# Patient Record
Sex: Female | Born: 1945
Health system: Southern US, Community
[De-identification: ages and names within clinical notes are randomized; demographics above are authoritative.]

## PROBLEM LIST (undated history)

## (undated) DIAGNOSIS — M549 Dorsalgia, unspecified: Secondary | ICD-10-CM

## (undated) DIAGNOSIS — M1990 Unspecified osteoarthritis, unspecified site: Secondary | ICD-10-CM

## (undated) DIAGNOSIS — J449 Chronic obstructive pulmonary disease, unspecified: Secondary | ICD-10-CM

## (undated) DIAGNOSIS — I10 Essential (primary) hypertension: Secondary | ICD-10-CM

## (undated) DIAGNOSIS — G4730 Sleep apnea, unspecified: Secondary | ICD-10-CM

## (undated) DIAGNOSIS — J45909 Unspecified asthma, uncomplicated: Secondary | ICD-10-CM

## (undated) DIAGNOSIS — E785 Hyperlipidemia, unspecified: Secondary | ICD-10-CM

## (undated) DIAGNOSIS — K219 Gastro-esophageal reflux disease without esophagitis: Secondary | ICD-10-CM

## (undated) DIAGNOSIS — R569 Unspecified convulsions: Secondary | ICD-10-CM

## (undated) DIAGNOSIS — E119 Type 2 diabetes mellitus without complications: Secondary | ICD-10-CM

## (undated) DIAGNOSIS — I639 Cerebral infarction, unspecified: Secondary | ICD-10-CM

## (undated) HISTORY — DX: Chronic obstructive pulmonary disease, unspecified: J44.9

## (undated) HISTORY — PX: EYE SURGERY: SHX253

## (undated) HISTORY — PX: ANTERIOR (CYSTOCELE) AND POSTERIOR REPAIR (RECTOCELE) WITH XENFORM GRAFT AND SACROSPINOUS FIXATION: SHX6492

## (undated) HISTORY — PX: APPENDECTOMY: SHX54

## (undated) HISTORY — PX: BACK SURGERY: SHX140

## (undated) HISTORY — PX: OTHER SURGICAL HISTORY: SHX169

## (undated) HISTORY — PX: ROTATOR CUFF REPAIR: SHX139

## (undated) HISTORY — PX: ABDOMINAL HYSTERECTOMY: SHX81

---

## 2005-01-07 ENCOUNTER — Other Ambulatory Visit: Payer: Self-pay

## 2005-01-08 ENCOUNTER — Other Ambulatory Visit: Payer: Self-pay

## 2005-01-08 ENCOUNTER — Observation Stay: Payer: Self-pay | Admitting: Internal Medicine

## 2005-01-09 ENCOUNTER — Other Ambulatory Visit: Payer: Self-pay

## 2005-04-13 ENCOUNTER — Ambulatory Visit: Payer: Self-pay | Admitting: Family Medicine

## 2005-07-02 ENCOUNTER — Emergency Department: Payer: Self-pay | Admitting: General Practice

## 2006-08-29 ENCOUNTER — Ambulatory Visit: Payer: Self-pay | Admitting: Family Medicine

## 2007-12-15 ENCOUNTER — Observation Stay: Payer: Self-pay | Admitting: Internal Medicine

## 2007-12-15 ENCOUNTER — Other Ambulatory Visit: Payer: Self-pay

## 2007-12-25 ENCOUNTER — Ambulatory Visit: Payer: Self-pay | Admitting: Gastroenterology

## 2007-12-26 ENCOUNTER — Ambulatory Visit: Payer: Self-pay | Admitting: Gastroenterology

## 2008-02-09 ENCOUNTER — Ambulatory Visit: Payer: Self-pay | Admitting: Gastroenterology

## 2008-02-23 ENCOUNTER — Ambulatory Visit: Payer: Self-pay | Admitting: Unknown Physician Specialty

## 2008-05-14 ENCOUNTER — Ambulatory Visit: Payer: Self-pay | Admitting: Obstetrics and Gynecology

## 2008-06-18 ENCOUNTER — Emergency Department: Payer: Self-pay | Admitting: Emergency Medicine

## 2008-06-23 ENCOUNTER — Emergency Department: Payer: Self-pay | Admitting: Emergency Medicine

## 2008-07-08 ENCOUNTER — Ambulatory Visit: Payer: Self-pay

## 2008-07-17 ENCOUNTER — Ambulatory Visit: Payer: Self-pay | Admitting: Unknown Physician Specialty

## 2008-11-08 ENCOUNTER — Ambulatory Visit: Payer: Self-pay | Admitting: Obstetrics and Gynecology

## 2009-01-15 ENCOUNTER — Ambulatory Visit: Payer: Self-pay | Admitting: Family Medicine

## 2009-06-12 ENCOUNTER — Ambulatory Visit: Payer: Self-pay | Admitting: Specialist

## 2009-07-09 ENCOUNTER — Ambulatory Visit: Payer: Self-pay | Admitting: Family Medicine

## 2009-09-04 ENCOUNTER — Inpatient Hospital Stay: Payer: Self-pay | Admitting: Internal Medicine

## 2010-04-21 ENCOUNTER — Ambulatory Visit: Payer: Self-pay | Admitting: Family Medicine

## 2010-07-17 ENCOUNTER — Ambulatory Visit: Payer: Self-pay | Admitting: Allergy

## 2010-11-17 ENCOUNTER — Ambulatory Visit: Payer: Self-pay | Admitting: Obstetrics and Gynecology

## 2010-11-27 ENCOUNTER — Emergency Department: Payer: Self-pay | Admitting: Emergency Medicine

## 2012-02-29 ENCOUNTER — Emergency Department: Payer: Self-pay | Admitting: Emergency Medicine

## 2012-02-29 LAB — COMPREHENSIVE METABOLIC PANEL
Albumin: 4.1 g/dL (ref 3.4–5.0)
Alkaline Phosphatase: 94 U/L (ref 50–136)
Anion Gap: 14 (ref 7–16)
BUN: 33 mg/dL — ABNORMAL HIGH (ref 7–18)
Bilirubin,Total: 0.3 mg/dL (ref 0.2–1.0)
Calcium, Total: 9.2 mg/dL (ref 8.5–10.1)
Chloride: 107 mmol/L (ref 98–107)
Co2: 21 mmol/L (ref 21–32)
Creatinine: 1.16 mg/dL (ref 0.60–1.30)
EGFR (African American): >60
EGFR (Non-African Amer.): 50 — ABNORMAL LOW
Glucose: 131 mg/dL — ABNORMAL HIGH (ref 65–99)
Osmolality: 292 (ref 275–301)
Potassium: 5 mmol/L (ref 3.5–5.1)
SGOT(AST): 27 U/L (ref 15–37)
SGPT (ALT): 30 U/L
Sodium: 142 mmol/L (ref 136–145)
Total Protein: 7.7 g/dL (ref 6.4–8.2)

## 2012-02-29 LAB — CBC
HGB: 12.5 g/dL (ref 12.0–16.0)
MCH: 29.9 pg (ref 26.0–34.0)
MCV: 87 fL (ref 80–100)
RBC: 4.19 10*6/uL (ref 3.80–5.20)
RDW: 13.4 % (ref 11.5–14.5)

## 2012-02-29 LAB — URINALYSIS, COMPLETE
Bilirubin,UR: NEGATIVE
Blood: NEGATIVE
Glucose,UR: NEGATIVE mg/dL (ref 0–75)
Ketone: NEGATIVE
Leukocyte Esterase: NEGATIVE
Nitrite: NEGATIVE
Ph: 5 (ref 4.5–8.0)
Protein: NEGATIVE
RBC,UR: NONE SEEN /HPF (ref 0–5)
Specific Gravity: 1.005 (ref 1.003–1.030)
Squamous Epithelial: 1
WBC UR: <1 /HPF (ref 0–5)

## 2012-02-29 LAB — LIPASE, BLOOD: Lipase: 282 U/L (ref 73–393)

## 2012-03-11 ENCOUNTER — Other Ambulatory Visit: Payer: Self-pay | Admitting: Gastroenterology

## 2012-03-11 LAB — CLOSTRIDIUM DIFFICILE BY PCR

## 2012-03-14 LAB — STOOL CULTURE

## 2012-05-02 ENCOUNTER — Ambulatory Visit: Payer: Self-pay | Admitting: Gastroenterology

## 2013-03-07 ENCOUNTER — Ambulatory Visit: Payer: Self-pay | Admitting: Family Medicine

## 2013-03-09 ENCOUNTER — Ambulatory Visit: Payer: Self-pay | Admitting: Family Medicine

## 2013-12-03 ENCOUNTER — Ambulatory Visit: Payer: Self-pay | Admitting: Family Medicine

## 2014-04-16 ENCOUNTER — Ambulatory Visit: Payer: Self-pay | Admitting: Family Medicine

## 2014-08-08 ENCOUNTER — Ambulatory Visit: Payer: Self-pay | Admitting: Family Medicine

## 2014-08-08 LAB — POTASSIUM: POTASSIUM: 4.7 mmol/L (ref 3.5–5.1)

## 2014-10-15 DIAGNOSIS — E781 Pure hyperglyceridemia: Secondary | ICD-10-CM | POA: Insufficient documentation

## 2014-10-15 DIAGNOSIS — E785 Hyperlipidemia, unspecified: Secondary | ICD-10-CM | POA: Insufficient documentation

## 2014-10-16 ENCOUNTER — Ambulatory Visit: Payer: Self-pay | Admitting: Unknown Physician Specialty

## 2014-10-29 ENCOUNTER — Ambulatory Visit: Payer: Self-pay | Admitting: Gastroenterology

## 2014-11-04 ENCOUNTER — Ambulatory Visit: Payer: Self-pay | Admitting: Gastroenterology

## 2014-12-25 ENCOUNTER — Ambulatory Visit: Payer: Self-pay | Admitting: Physician Assistant

## 2014-12-25 DIAGNOSIS — T148 Other injury of unspecified body region: Secondary | ICD-10-CM | POA: Diagnosis not present

## 2014-12-25 DIAGNOSIS — M79671 Pain in right foot: Secondary | ICD-10-CM | POA: Diagnosis not present

## 2014-12-25 DIAGNOSIS — S99921A Unspecified injury of right foot, initial encounter: Secondary | ICD-10-CM | POA: Diagnosis not present

## 2015-01-02 DIAGNOSIS — B373 Candidiasis of vulva and vagina: Secondary | ICD-10-CM | POA: Diagnosis not present

## 2015-01-02 DIAGNOSIS — N3000 Acute cystitis without hematuria: Secondary | ICD-10-CM | POA: Diagnosis not present

## 2015-02-18 DIAGNOSIS — J40 Bronchitis, not specified as acute or chronic: Secondary | ICD-10-CM | POA: Diagnosis not present

## 2015-02-18 DIAGNOSIS — Z634 Disappearance and death of family member: Secondary | ICD-10-CM | POA: Diagnosis not present

## 2015-02-18 DIAGNOSIS — F4320 Adjustment disorder, unspecified: Secondary | ICD-10-CM | POA: Diagnosis not present

## 2015-02-18 DIAGNOSIS — J449 Chronic obstructive pulmonary disease, unspecified: Secondary | ICD-10-CM | POA: Diagnosis not present

## 2015-02-18 DIAGNOSIS — R350 Frequency of micturition: Secondary | ICD-10-CM | POA: Diagnosis not present

## 2015-02-24 DIAGNOSIS — R350 Frequency of micturition: Secondary | ICD-10-CM | POA: Diagnosis not present

## 2015-03-07 DIAGNOSIS — J439 Emphysema, unspecified: Secondary | ICD-10-CM | POA: Diagnosis not present

## 2015-03-10 DIAGNOSIS — J4520 Mild intermittent asthma, uncomplicated: Secondary | ICD-10-CM | POA: Diagnosis not present

## 2015-03-11 DIAGNOSIS — I739 Peripheral vascular disease, unspecified: Secondary | ICD-10-CM | POA: Diagnosis not present

## 2015-03-11 DIAGNOSIS — E785 Hyperlipidemia, unspecified: Secondary | ICD-10-CM | POA: Diagnosis not present

## 2015-03-11 DIAGNOSIS — I7100 Dissection of unspecified site of aorta: Secondary | ICD-10-CM | POA: Diagnosis not present

## 2015-03-11 DIAGNOSIS — I714 Abdominal aortic aneurysm, without rupture: Secondary | ICD-10-CM | POA: Diagnosis not present

## 2015-03-28 ENCOUNTER — Ambulatory Visit
Admit: 2015-03-28 | Disposition: A | Discharge status: Home / Self Care | Payer: Self-pay | Attending: Family Medicine | Admitting: Family Medicine

## 2015-03-28 DIAGNOSIS — I2510 Atherosclerotic heart disease of native coronary artery without angina pectoris: Secondary | ICD-10-CM | POA: Diagnosis not present

## 2015-03-28 DIAGNOSIS — M5136 Other intervertebral disc degeneration, lumbar region: Secondary | ICD-10-CM | POA: Diagnosis not present

## 2015-03-28 DIAGNOSIS — M898X8 Other specified disorders of bone, other site: Secondary | ICD-10-CM | POA: Diagnosis not present

## 2015-03-28 DIAGNOSIS — M47816 Spondylosis without myelopathy or radiculopathy, lumbar region: Secondary | ICD-10-CM | POA: Diagnosis not present

## 2015-03-28 DIAGNOSIS — B373 Candidiasis of vulva and vagina: Secondary | ICD-10-CM | POA: Diagnosis not present

## 2015-03-28 DIAGNOSIS — M47896 Other spondylosis, lumbar region: Secondary | ICD-10-CM | POA: Diagnosis not present

## 2015-03-31 LAB — SURGICAL PATHOLOGY

## 2015-04-10 DIAGNOSIS — E119 Type 2 diabetes mellitus without complications: Secondary | ICD-10-CM | POA: Diagnosis not present

## 2015-04-15 DIAGNOSIS — E119 Type 2 diabetes mellitus without complications: Secondary | ICD-10-CM | POA: Diagnosis not present

## 2015-04-16 DIAGNOSIS — M47816 Spondylosis without myelopathy or radiculopathy, lumbar region: Secondary | ICD-10-CM | POA: Diagnosis not present

## 2015-04-16 DIAGNOSIS — M48062 Spinal stenosis, lumbar region with neurogenic claudication: Secondary | ICD-10-CM | POA: Diagnosis present

## 2015-04-16 DIAGNOSIS — M4316 Spondylolisthesis, lumbar region: Secondary | ICD-10-CM | POA: Diagnosis not present

## 2015-04-16 DIAGNOSIS — M9943 Connective tissue stenosis of neural canal of lumbar region: Secondary | ICD-10-CM | POA: Diagnosis not present

## 2015-04-17 DIAGNOSIS — E785 Hyperlipidemia, unspecified: Secondary | ICD-10-CM | POA: Diagnosis not present

## 2015-04-17 DIAGNOSIS — E781 Pure hyperglyceridemia: Secondary | ICD-10-CM | POA: Diagnosis not present

## 2015-04-17 DIAGNOSIS — I10 Essential (primary) hypertension: Secondary | ICD-10-CM | POA: Diagnosis not present

## 2015-04-17 DIAGNOSIS — E1129 Type 2 diabetes mellitus with other diabetic kidney complication: Secondary | ICD-10-CM | POA: Diagnosis not present

## 2015-04-18 ENCOUNTER — Other Ambulatory Visit: Payer: Self-pay | Admitting: Surgery

## 2015-04-18 DIAGNOSIS — M9943 Connective tissue stenosis of neural canal of lumbar region: Secondary | ICD-10-CM

## 2015-04-21 ENCOUNTER — Encounter: Payer: Self-pay | Admitting: Emergency Medicine

## 2015-04-21 ENCOUNTER — Other Ambulatory Visit: Payer: Self-pay

## 2015-04-21 ENCOUNTER — Emergency Department
Admission: EM | Admit: 2015-04-21 | Discharge: 2015-04-21 | Disposition: A | Discharge status: Home / Self Care | Payer: Medicare Other | Attending: Emergency Medicine | Admitting: Emergency Medicine

## 2015-04-21 DIAGNOSIS — I10 Essential (primary) hypertension: Secondary | ICD-10-CM | POA: Insufficient documentation

## 2015-04-21 DIAGNOSIS — F419 Anxiety disorder, unspecified: Secondary | ICD-10-CM | POA: Insufficient documentation

## 2015-04-21 DIAGNOSIS — Z880 Allergy status to penicillin: Secondary | ICD-10-CM | POA: Insufficient documentation

## 2015-04-21 DIAGNOSIS — E119 Type 2 diabetes mellitus without complications: Secondary | ICD-10-CM | POA: Diagnosis not present

## 2015-04-21 HISTORY — DX: Hyperlipidemia, unspecified: E78.5

## 2015-04-21 HISTORY — DX: Essential (primary) hypertension: I10

## 2015-04-21 HISTORY — DX: Type 2 diabetes mellitus without complications: E11.9

## 2015-04-21 HISTORY — DX: Dorsalgia, unspecified: M54.9

## 2015-04-21 LAB — CBC
HEMATOCRIT: 40.4 % (ref 35.0–47.0)
HEMOGLOBIN: 13.3 g/dL (ref 12.0–16.0)
MCH: 28 pg (ref 26.0–34.0)
MCHC: 32.9 g/dL (ref 32.0–36.0)
MCV: 85 fL (ref 80.0–100.0)
Platelets: 291 10*3/uL (ref 150–440)
RBC: 4.75 MIL/uL (ref 3.80–5.20)
RDW: 14.6 % — ABNORMAL HIGH (ref 11.5–14.5)
WBC: 8.9 10*3/uL (ref 3.6–11.0)

## 2015-04-21 LAB — COMPREHENSIVE METABOLIC PANEL
ALT: 25 U/L (ref 14–54)
ANION GAP: 11 (ref 5–15)
AST: 30 U/L (ref 15–41)
Albumin: 3.9 g/dL (ref 3.5–5.0)
Alkaline Phosphatase: 57 U/L (ref 38–126)
BUN: 30 mg/dL — ABNORMAL HIGH (ref 6–20)
CALCIUM: 10.2 mg/dL (ref 8.9–10.3)
CO2: 24 mmol/L (ref 22–32)
Chloride: 102 mmol/L (ref 101–111)
Creatinine, Ser: 1.17 mg/dL — ABNORMAL HIGH (ref 0.44–1.00)
GFR calc Af Amer: 55 mL/min — ABNORMAL LOW (ref >60)
GFR calc non Af Amer: 47 mL/min — ABNORMAL LOW (ref >60)
Glucose, Bld: 119 mg/dL — ABNORMAL HIGH (ref 65–99)
Potassium: 4.3 mmol/L (ref 3.5–5.1)
SODIUM: 137 mmol/L (ref 135–145)
Total Bilirubin: 0.7 mg/dL (ref 0.3–1.2)
Total Protein: 7.1 g/dL (ref 6.5–8.1)

## 2015-04-21 LAB — TROPONIN I: Troponin I: <0.03 ng/mL (ref <0.031)

## 2015-04-21 MED ORDER — LABETALOL HCL 5 MG/ML IV SOLN
10.0000 mg | Freq: Once | INTRAVENOUS | Status: AC
  Administered 2015-04-21: 10 mg via INTRAVENOUS

## 2015-04-21 MED ORDER — LABETALOL HCL 5 MG/ML IV SOLN
INTRAVENOUS | Status: AC
  Administered 2015-04-21: 10 mg via INTRAVENOUS
  Filled 2015-04-21: qty 4

## 2015-04-21 NOTE — ED Provider Notes (Signed)
[facility] Emergency Department Provider Note  ____________________________________________  Time seen: 9 AM  I have reviewed the triage vital signs and the nursing notes.   HISTORY  Chief Complaint Hypertension      HPI Judy Bolton is a 69 y.o. female who presents with complaints of high blood pressure. She reports her endocrinologist adjusted her blood pressure medications 3 days ago and she feels this may have been a mistake. Today she checked her blood pressure and was over 200 systolic. She does complain of some vague dizziness. But no chest pain no shortness of breath. No focal deficits and no headache. She does have a long history of high blood pressure which has been decently controlled until recently. Patient is able to follow up with her PCP in one day     Past Medical History  Diagnosis Date  . Hypertension   . Diabetes mellitus without complication   . Hyperlipidemia   . Back pain     There are no active problems to display for this patient.   Past Surgical History  Procedure Laterality Date  . Abdominal hysterectomy    . Rotator cuff repair Right   . Appendectomy      No current outpatient prescriptions on file.  Allergies Codeine and Penicillins  Family History  Problem Relation Age of Onset  . Heart attack Mother   . Cancer Father     Social History History  Substance Use Topics  . Smoking status: Never Smoker   . Smokeless tobacco: Not on file  . Alcohol Use: Yes    Review of Systems  Constitutional: Negative for fever. Eyes: Negative for visual changes. ENT: Negative for sore throat. Cardiovascular: Possible acid reflux Respiratory: Negative for shortness of breath. Gastrointestinal: Negative for abdominal pain, vomiting and diarrhea. Genitourinary: Negative for dysuria. Musculoskeletal: Negative for back pain. Skin: Negative for rash. Neurological: Negative for headaches, focal weakness or  numbness. Psychiatric: Mild anxiety  10-point ROS otherwise negative.  ____________________________________________   PHYSICAL EXAM:  VITAL SIGNS: ED Triage Vitals  Enc Vitals Group     BP 04/21/15 0853 195/82 mmHg     Pulse Rate 04/21/15 0853 82     Resp 04/21/15 0853 18     Temp 04/21/15 0853 98.2 F (36.8 C)     Temp Source 04/21/15 0853 Oral     SpO2 04/21/15 0853 96 %     Weight 04/21/15 0853 146 lb (66.225 kg)     Height 04/21/15 0853 5\' 2"  (1.575 m)     Head Cir --      Peak Flow --      Pain Score 04/21/15 0853 5     Pain Loc --      Pain Edu? --      Excl. in GC? --      Constitutional: Alert and oriented. Well appearing and in no distress. Eyes: Conjunctivae are normal. PERRL. Normal extraocular movements. ENT   Head: Normocephalic and atraumatic.   Nose: No congestion/rhinnorhea.   Mouth/Throat: Mucous membranes are moist.   Neck: No stridor. Hematological/Lymphatic/Immunilogical: No cervical lymphadenopathy. Cardiovascular: Normal rate, regular rhythm. Normal and symmetric distal pulses are present in all extremities. No murmurs, rubs, or gallops. Respiratory: Normal respiratory effort without tachypnea nor retractions. Breath sounds are clear and equal bilaterally. No wheezes/rales/rhonchi. Gastrointestinal: Soft and nontender. No distention. There is no CVA tenderness. Genitourinary: deferred Musculoskeletal: Nontender with normal range of motion in all extremities. No joint effusions.  No lower extremity  tenderness nor edema. Neurologic:  Normal speech and language. No gross focal neurologic deficits are appreciated. Speech is normal.  Skin:  Skin is warm, dry and intact. No rash noted. Psychiatric: Mood and affect are normal. Speech and behavior are normal. Patient exhibits appropriate insight and judgment.  ____________________________________________    LABS (pertinent positives/negatives)  Mildly elevated creatinine likely due to  dehydration, otherwise normal labs  ____________________________________________   EKG   Date: 04/21/2015  Rate: 75  Rhythm: normal sinus rhythm  QRS Axis: normal  Intervals: normal  ST/T Wave abnormalities: normal  Conduction Disutrbances: none  Narrative Interpretation: unremarkable      ____________________________________________    RADIOLOGY  None ____________________________________________   PROCEDURES  Procedure(s) performed: None Critical Care performed: None    ____________________________________________   INITIAL IMPRESSION / ASSESSMENT AND PLAN / ED COURSE  Pertinent labs & imaging results that were available during my care of the patient were reviewed by me and considered in my medical decision making (see chart for details).  Patient well-appearing, benign exam except for elevated blood pressure. We will check enzymes kidney function and give labetalol 10 mg IV.  ____________________________________________ ----------------------------------------- 11:08 AM on 04/21/2015 -----------------------------------------  Blood pressure is improved significantly after labetalol IV. Patient has follow-up with PCP this week. Return precautions given. Patient feels well  FINAL CLINICAL IMPRESSION(S) / ED DIAGNOSES  Final diagnoses:  Essential hypertension     Jene Every, MD 04/21/15 1108

## 2015-04-21 NOTE — Discharge Instructions (Signed)

## 2015-04-21 NOTE — ED Notes (Signed)
Pt to ed with c/o elevated blood pressure since Friday.  Pt states she was seen by PMD on Friday and meds changed but HTN remains.

## 2015-04-22 DIAGNOSIS — E876 Hypokalemia: Secondary | ICD-10-CM | POA: Diagnosis not present

## 2015-04-22 DIAGNOSIS — I10 Essential (primary) hypertension: Secondary | ICD-10-CM | POA: Diagnosis not present

## 2015-04-22 DIAGNOSIS — Z09 Encounter for follow-up examination after completed treatment for conditions other than malignant neoplasm: Secondary | ICD-10-CM | POA: Diagnosis not present

## 2015-04-25 ENCOUNTER — Ambulatory Visit
Admission: RE | Admit: 2015-04-25 | Discharge: 2015-04-25 | Disposition: A | Discharge status: Home / Self Care | Payer: Medicare Other | Source: Ambulatory Visit | Attending: Surgery | Admitting: Surgery

## 2015-04-25 DIAGNOSIS — M9943 Connective tissue stenosis of neural canal of lumbar region: Secondary | ICD-10-CM

## 2015-04-25 DIAGNOSIS — M545 Low back pain: Secondary | ICD-10-CM | POA: Diagnosis present

## 2015-04-25 DIAGNOSIS — M4806 Spinal stenosis, lumbar region: Secondary | ICD-10-CM | POA: Insufficient documentation

## 2015-05-07 DIAGNOSIS — M5416 Radiculopathy, lumbar region: Secondary | ICD-10-CM | POA: Diagnosis not present

## 2015-05-07 DIAGNOSIS — M4806 Spinal stenosis, lumbar region: Secondary | ICD-10-CM | POA: Diagnosis not present

## 2015-05-12 ENCOUNTER — Other Ambulatory Visit: Payer: Self-pay | Admitting: Family Medicine

## 2015-05-12 DIAGNOSIS — E785 Hyperlipidemia, unspecified: Secondary | ICD-10-CM

## 2015-05-20 ENCOUNTER — Other Ambulatory Visit: Payer: Self-pay | Admitting: Family Medicine

## 2015-05-20 DIAGNOSIS — I10 Essential (primary) hypertension: Secondary | ICD-10-CM

## 2015-05-28 DIAGNOSIS — M4806 Spinal stenosis, lumbar region: Secondary | ICD-10-CM | POA: Diagnosis not present

## 2015-05-28 DIAGNOSIS — M5416 Radiculopathy, lumbar region: Secondary | ICD-10-CM | POA: Diagnosis not present

## 2015-06-19 ENCOUNTER — Other Ambulatory Visit: Payer: Self-pay | Admitting: Family Medicine

## 2015-06-19 DIAGNOSIS — E785 Hyperlipidemia, unspecified: Secondary | ICD-10-CM

## 2015-06-24 DIAGNOSIS — J449 Chronic obstructive pulmonary disease, unspecified: Secondary | ICD-10-CM | POA: Diagnosis not present

## 2015-06-24 DIAGNOSIS — J45909 Unspecified asthma, uncomplicated: Secondary | ICD-10-CM | POA: Diagnosis not present

## 2015-06-25 DIAGNOSIS — M5416 Radiculopathy, lumbar region: Secondary | ICD-10-CM | POA: Diagnosis not present

## 2015-06-25 DIAGNOSIS — M4806 Spinal stenosis, lumbar region: Secondary | ICD-10-CM | POA: Diagnosis not present

## 2015-07-13 ENCOUNTER — Other Ambulatory Visit: Payer: Self-pay | Admitting: Family Medicine

## 2015-07-13 DIAGNOSIS — I10 Essential (primary) hypertension: Secondary | ICD-10-CM

## 2015-07-16 DIAGNOSIS — M47816 Spondylosis without myelopathy or radiculopathy, lumbar region: Secondary | ICD-10-CM | POA: Diagnosis not present

## 2015-07-16 DIAGNOSIS — M9943 Connective tissue stenosis of neural canal of lumbar region: Secondary | ICD-10-CM | POA: Diagnosis not present

## 2015-07-16 DIAGNOSIS — M4316 Spondylolisthesis, lumbar region: Secondary | ICD-10-CM | POA: Diagnosis not present

## 2015-07-21 DIAGNOSIS — M545 Low back pain: Secondary | ICD-10-CM | POA: Diagnosis not present

## 2015-07-21 DIAGNOSIS — M6281 Muscle weakness (generalized): Secondary | ICD-10-CM | POA: Diagnosis not present

## 2015-08-12 ENCOUNTER — Other Ambulatory Visit: Payer: Self-pay | Admitting: Family Medicine

## 2015-08-12 DIAGNOSIS — I10 Essential (primary) hypertension: Secondary | ICD-10-CM

## 2015-08-13 DIAGNOSIS — R062 Wheezing: Secondary | ICD-10-CM | POA: Diagnosis not present

## 2015-08-15 ENCOUNTER — Ambulatory Visit
Admission: EM | Admit: 2015-08-15 | Discharge: 2015-08-15 | Disposition: A | Discharge status: Home / Self Care | Payer: Medicare Other | Attending: Family Medicine | Admitting: Family Medicine

## 2015-08-15 ENCOUNTER — Encounter: Payer: Self-pay | Admitting: Emergency Medicine

## 2015-08-15 DIAGNOSIS — T148XXA Other injury of unspecified body region, initial encounter: Secondary | ICD-10-CM

## 2015-08-15 DIAGNOSIS — S81812A Laceration without foreign body, left lower leg, initial encounter: Secondary | ICD-10-CM

## 2015-08-15 HISTORY — DX: Unspecified osteoarthritis, unspecified site: M19.90

## 2015-08-15 HISTORY — DX: Unspecified asthma, uncomplicated: J45.909

## 2015-08-15 HISTORY — DX: Gastro-esophageal reflux disease without esophagitis: K21.9

## 2015-08-15 MED ORDER — MUPIROCIN 2 % EX OINT: 1.0000 "application " | TOPICAL_OINTMENT | Freq: Two times a day (BID) | CUTANEOUS | Status: DC

## 2015-08-15 MED ORDER — CLINDAMYCIN HCL 300 MG PO CAPS
300.0000 mg | ORAL_CAPSULE | Freq: Three times a day (TID) | ORAL | Status: DC
Start: 2015-08-15 — End: 2015-10-16

## 2015-08-15 NOTE — Discharge Instructions (Signed)
Contusion A contusion is a deep bruise. Contusions are the result of an injury that caused bleeding under the skin. The contusion may turn blue, purple, or yellow. Minor injuries will give you a painless contusion, but more severe contusions may stay painful and swollen for a few weeks.  CAUSES  A contusion is usually caused by a blow, trauma, or direct force to an area of the body. SYMPTOMS   Swelling and redness of the injured area.  Bruising of the injured area.  Tenderness and soreness of the injured area.  Pain. DIAGNOSIS  The diagnosis can be made by taking a history and physical exam. An X-ray, CT scan, or MRI may be needed to determine if there were any associated injuries, such as fractures. TREATMENT  Specific treatment will depend on what area of the body was injured. In general, the best treatment for a contusion is resting, icing, elevating, and applying cold compresses to the injured area. Over-the-counter medicines may also be recommended for pain control. Ask your caregiver what the best treatment is for your contusion. HOME CARE INSTRUCTIONS   Put ice on the injured area.  Put ice in a plastic bag.  Place a towel between your skin and the bag.  Leave the ice on for 15-20 minutes, 3-4 times a day, or as directed by your health care provider.  Only take over-the-counter or prescription medicines for pain, discomfort, or fever as directed by your caregiver. Your caregiver may recommend avoiding anti-inflammatory medicines (aspirin, ibuprofen, and naproxen) for 48 hours because these medicines may increase bruising.  Rest the injured area.  If possible, elevate the injured area to reduce swelling. SEEK IMMEDIATE MEDICAL CARE IF:   You have increased bruising or swelling.  You have pain that is getting worse.  Your swelling or pain is not relieved with medicines. MAKE SURE YOU:   Understand these instructions.  Will watch your condition.  Will get help right  away if you are not doing well or get worse. Document Released: 09/01/2005 Document Revised: 11/27/2013 Document Reviewed: 09/27/2011 [facility] Patient Information 2015 [place], [place]. This information is not intended to replace advice given to you by your health care provider. Make sure you discuss any questions you have with your health care provider. Abrasion An abrasion is a cut or scrape of the skin. Abrasions do not extend through all layers of the skin and most heal within 10 days. It is important to care for your abrasion properly to prevent infection. CAUSES  Most abrasions are caused by falling on, or gliding across, the ground or other surface. When your skin rubs on something, the outer and inner layer of skin rubs off, causing an abrasion. DIAGNOSIS  Your caregiver will be able to diagnose an abrasion during a physical exam.  TREATMENT  Your treatment depends on how large and deep the abrasion is. Generally, your abrasion will be cleaned with water and a mild soap to remove any dirt or debris. An antibiotic ointment may be put over the abrasion to prevent an infection. A bandage (dressing) may be wrapped around the abrasion to keep it from getting dirty.  You may need a tetanus shot if:  You cannot remember when you had your last tetanus shot.  You have never had a tetanus shot.  The injury broke your skin. If you get a tetanus shot, your arm may swell, get red, and feel warm to the touch. This is common and not a problem. If you need a tetanus  shot and you choose not to have one, there is a rare chance of getting tetanus. Sickness from tetanus can be serious.  HOME CARE INSTRUCTIONS   If a dressing was applied, change it at least once a day or as directed by your caregiver. If the bandage sticks, soak it off with warm water.   Wash the area with water and a mild soap to remove all the ointment 2 times a day. Rinse off the soap and pat the area dry with a clean towel.    Reapply any ointment as directed by your caregiver. This will help prevent infection and keep the bandage from sticking. Use gauze over the wound and under the dressing to help keep the bandage from sticking.   Change your dressing right away if it becomes wet or dirty.   Only take over-the-counter or prescription medicines for pain, discomfort, or fever as directed by your caregiver.   Follow up with your caregiver within 24-48 hours for a wound check, or as directed. If you were not given a wound-check appointment, look closely at your abrasion for redness, swelling, or pus. These are signs of infection. SEEK IMMEDIATE MEDICAL CARE IF:   You have increasing pain in the wound.   You have redness, swelling, or tenderness around the wound.   You have pus coming from the wound.   You have a fever or persistent symptoms for more than 2-3 days.  You have a fever and your symptoms suddenly get worse.  You have a bad smell coming from the wound or dressing.  MAKE SURE YOU:   Understand these instructions.  Will watch your condition.  Will get help right away if you are not doing well or get worse. Document Released: 09/01/2005 Document Revised: 11/08/2012 Document Reviewed: 10/26/2011 [facility] Patient Information 2015 [place], [place]. This information is not intended to replace advice given to you by your health care provider. Make sure you discuss any questions you have with your health care provider. Laceration Care, Adult A laceration is a cut or lesion that goes through all layers of the skin and into the tissue just beneath the skin. TREATMENT  Some lacerations may not require closure. Some lacerations may not be able to be closed due to an increased risk of infection. It is important to see your caregiver as soon as possible after an injury to minimize the risk of infection and maximize the opportunity for successful closure. If closure is appropriate, pain medicines may  be given, if needed. The wound will be cleaned to help prevent infection. Your caregiver will use stitches (sutures), staples, wound glue (adhesive), or skin adhesive strips to repair the laceration. These tools bring the skin edges together to allow for faster healing and a better cosmetic outcome. However, all wounds will heal with a scar. Once the wound has healed, scarring can be minimized by covering the wound with sunscreen during the day for 1 full year. HOME CARE INSTRUCTIONS  For sutures or staples:  Keep the wound clean and dry.  If you were given a bandage (dressing), you should change it at least once a day. Also, change the dressing if it becomes wet or dirty, or as directed by your caregiver.  Wash the wound with soap and water 2 times a day. Rinse the wound off with water to remove all soap. Pat the wound dry with a clean towel.  After cleaning, apply a thin layer of the antibiotic ointment as recommended by your caregiver. This  will help prevent infection and keep the dressing from sticking.  You may shower as usual after the first 24 hours. Do not soak the wound in water until the sutures are removed.  Only take over-the-counter or prescription medicines for pain, discomfort, or fever as directed by your caregiver.  Get your sutures or staples removed as directed by your caregiver. For skin adhesive strips:  Keep the wound clean and dry.  Do not get the skin adhesive strips wet. You may bathe carefully, using caution to keep the wound dry.  If the wound gets wet, pat it dry with a clean towel.  Skin adhesive strips will fall off on their own. You may trim the strips as the wound heals. Do not remove skin adhesive strips that are still stuck to the wound. They will fall off in time. For wound adhesive:  You may briefly wet your wound in the shower or bath. Do not soak or scrub the wound. Do not swim. Avoid periods of heavy perspiration until the skin adhesive has fallen  off on its own. After showering or bathing, gently pat the wound dry with a clean towel.  Do not apply liquid medicine, cream medicine, or ointment medicine to your wound while the skin adhesive is in place. This may loosen the film before your wound is healed.  If a dressing is placed over the wound, be careful not to apply tape directly over the skin adhesive. This may cause the adhesive to be pulled off before the wound is healed.  Avoid prolonged exposure to sunlight or tanning lamps while the skin adhesive is in place. Exposure to ultraviolet light in the first year will darken the scar.  The skin adhesive will usually remain in place for 5 to 10 days, then naturally fall off the skin. Do not pick at the adhesive film. You may need a tetanus shot if:  You cannot remember when you had your last tetanus shot.  You have never had a tetanus shot. If you get a tetanus shot, your arm may swell, get red, and feel warm to the touch. This is common and not a problem. If you need a tetanus shot and you choose not to have one, there is a rare chance of getting tetanus. Sickness from tetanus can be serious. SEEK MEDICAL CARE IF:   You have redness, swelling, or increasing pain in the wound.  You see a red line that goes away from the wound.  You have yellowish-white fluid (pus) coming from the wound.  You have a fever.  You notice a bad smell coming from the wound or dressing.  Your wound breaks open before or after sutures have been removed.  You notice something coming out of the wound such as wood or glass.  Your wound is on your hand or foot and you cannot move a finger or toe. SEEK IMMEDIATE MEDICAL CARE IF:   Your pain is not controlled with prescribed medicine.  You have severe swelling around the wound causing pain and numbness or a change in color in your arm, hand, leg, or foot.  Your wound splits open and starts bleeding.  You have worsening numbness, weakness, or loss of  function of any joint around or beyond the wound.  You develop painful lumps near the wound or on the skin anywhere on your body. MAKE SURE YOU:   Understand these instructions.  Will watch your condition.  Will get help right away if you are not doing  well or get worse. Document Released: 11/22/2005 Document Revised: 02/14/2012 Document Reviewed: 05/18/2011 [facility] Patient Information 2015 [place], [place]. This information is not intended to replace advice given to you by your health care provider. Make sure you discuss any questions you have with your health care provider. Cellulitis Cellulitis is an infection of the skin and the tissue beneath it. The infected area is usually red and tender. Cellulitis occurs most often in the arms and lower legs.  CAUSES  Cellulitis is caused by bacteria that enter the skin through cracks or cuts in the skin. The most common types of bacteria that cause cellulitis are staphylococci and streptococci. SIGNS AND SYMPTOMS   Redness and warmth.  Swelling.  Tenderness or pain.  Fever. DIAGNOSIS  Your health care provider can usually determine what is wrong based on a physical exam. Blood tests may also be done. TREATMENT  Treatment usually involves taking an antibiotic medicine. HOME CARE INSTRUCTIONS   Take your antibiotic medicine as directed by your health care provider. Finish the antibiotic even if you start to feel better.  Keep the infected arm or leg elevated to reduce swelling.  Apply a warm cloth to the affected area up to 4 times per day to relieve pain.  Take medicines only as directed by your health care provider.  Keep all follow-up visits as directed by your health care provider. SEEK MEDICAL CARE IF:   You notice red streaks coming from the infected area.  Your red area gets larger or turns dark in color.  Your bone or joint underneath the infected area becomes painful after the skin has healed.  Your infection returns  in the same area or another area.  You notice a swollen bump in the infected area.  You develop new symptoms.  You have a fever. SEEK IMMEDIATE MEDICAL CARE IF:   You feel very sleepy.  You develop vomiting or diarrhea.  You have a general ill feeling (malaise) with muscle aches and pains. MAKE SURE YOU:   Understand these instructions.  Will watch your condition.  Will get help right away if you are not doing well or get worse. Document Released: 09/01/2005 Document Revised: 04/08/2014 Document Reviewed: 02/07/2012 [facility] Patient Information 2015 [place], [place]. This information is not intended to replace advice given to you by your health care provider. Make sure you discuss any questions you have with your health care provider.

## 2015-08-15 NOTE — ED Notes (Signed)
Pt reports PTA she "fell out of tub" while cleaning it. Cut left leg/shin on metal railing that holds sliding door, also hit /scrapped Left arm on sliding door frame and hit R arm.  Friend reports pt had fall last week, pt stated she fell down stairs after dog pushed her legs out from under her , old bruise to R arm.

## 2015-08-16 NOTE — ED Provider Notes (Signed)
CSN: [id number]     Arrival date & time 08/15/15  1513 History   First MD Initiated Contact with Patient 08/15/15 1605     Chief Complaint  Patient presents with  . Fall  . Laceration   (Consider location/radiation/quality/duration/timing/severity/associated sxs/prior Treatment) HPI Comments: Single caucasian female was cleaning bathtub and when stepping out toe caught on lip and she fell and cut her left shin.  Has been bleeding profusely, applied pressure, iced and contacted friend to drive her to urgent care.  Thinks she may need sutures.  Right hip and left shin bruised and hurting able to bear weight without difficulty denied hitting head or loss of consciousness  Patient is a 69 y.o. female presenting with fall and skin laceration. The history is provided by the patient and a friend.  Fall This is a new problem. The current episode started 1 to 2 hours ago. The problem occurs rarely. Pertinent negatives include no chest pain, no abdominal pain, no headaches and no shortness of breath. She has tried a cold compress, rest and water for the symptoms.  Laceration Location:  Leg Leg laceration location:  L lower leg Length (cm):  30 Depth:  Cutaneous Quality: avulsion and straight   Bleeding: venous and controlled   Time since incident:  2 hours Laceration mechanism:  Fall Pain details:    Quality:  Aching   Severity:  Moderate   Timing:  Intermittent   Progression:  Waxing and waning Foreign body present:  No foreign bodies Relieved by:  Certain positions Worsened by:  Movement and pressure Tetanus status:  Up to date (Dr Yetta Barre [facility] 2014)   Past Medical History  Diagnosis Date  . Hypertension   . Diabetes mellitus without complication   . Hyperlipidemia   . Back pain   . Asthma   . Acid reflux   . Back pain   . Arthritis    Past Surgical History  Procedure Laterality Date  . Abdominal hysterectomy    . Rotator cuff repair Right   . Appendectomy     Family History    Problem Relation Age of Onset  . Heart attack Mother   . Cancer Father    Social History  Substance Use Topics  . Smoking status: Former Smoker    Quit date: 08/14/2000  . Smokeless tobacco: None  . Alcohol Use: Yes   OB History    Gravida Para Term Preterm AB TAB SAB Ectopic Multiple Living   3 1   2  2         Review of Systems  Constitutional: Negative for fever, chills, diaphoresis, activity change, appetite change and fatigue.  HENT: Negative for congestion, dental problem, drooling, ear discharge, ear pain, facial swelling, hearing loss, mouth sores, nosebleeds, postnasal drip, rhinorrhea, sinus pressure, sneezing, sore throat, tinnitus, trouble swallowing and voice change.   Eyes: Negative for photophobia, pain, discharge, redness, itching and visual disturbance.  Respiratory: Negative for cough, choking, chest tightness, shortness of breath, wheezing and stridor.   Cardiovascular: Negative for chest pain and leg swelling.  Gastrointestinal: Negative for nausea, vomiting, abdominal pain, diarrhea, constipation, blood in stool and abdominal distention.  Endocrine: Negative for cold intolerance and heat intolerance.  Genitourinary: Negative for dysuria, hematuria and difficulty urinating.  Musculoskeletal: Positive for back pain and arthralgias. Negative for joint swelling, gait problem, neck pain and neck stiffness.  Skin: Positive for color change and wound. Negative for pallor and rash.  Allergic/Immunologic: Positive for environmental allergies. Negative for food  allergies.  Neurological: Negative for dizziness, tremors, seizures, syncope, facial asymmetry, speech difficulty, weakness, light-headedness, numbness and headaches.  Hematological: Negative for adenopathy. Does not bruise/bleed easily.  Psychiatric/Behavioral: Negative for behavioral problems, confusion, sleep disturbance and agitation.    Allergies  Codeine and Penicillins  Home Medications   Prior to  Admission medications   Medication Sig Start Date End Date Taking? Authorizing Provider  Albuterol Sulfate (VENTOLIN HFA IN) Inhale into the lungs.   Yes Historical Provider, MD  aspirin 81 MG tablet Take 81 mg by mouth daily.   Yes Historical Provider, MD  azelastine (ASTELIN) 0.1 % nasal spray Place into both nostrils 2 (two) times daily. Use in each nostril as directed   Yes Historical Provider, MD  budesonide-formoterol (SYMBICORT) 160-4.5 MCG/ACT inhaler Inhale 2 puffs into the lungs 2 (two) times daily.   Yes Historical Provider, MD  docusate sodium (COLACE) 250 MG capsule Take 250 mg by mouth 2 (two) times daily.   Yes Historical Provider, MD  etodolac (LODINE) 500 MG tablet Take 500 mg by mouth 2 (two) times daily.   Yes Historical Provider, MD  gemfibrozil (LOPID) 600 MG tablet Take 600 mg by mouth 2 (two) times daily before a meal.   Yes Historical Provider, MD  Liraglutide (VICTOZA [place]) Inject 0.6 Units into the skin.   Yes Historical Provider, MD  losartan (COZAAR) 50 MG tablet Take 50 mg by mouth daily.   Yes Historical Provider, MD  metFORMIN (GLUCOPHAGE) 1000 MG tablet Take 1,000 mg by mouth 2 (two) times daily with a meal.   Yes Historical Provider, MD  pantoprazole (PROTONIX) 40 MG tablet Take 40 mg by mouth daily.   Yes Historical Provider, MD  potassium chloride (KLOR-CON) 20 MEQ packet Take by mouth 2 (two) times daily.   Yes Historical Provider, MD  predniSONE (DELTASONE) 1 MG tablet Take 5 mg by mouth daily with breakfast.   Yes Historical Provider, MD  Tiotropium Bromide Monohydrate (SPIRIVA HANDIHALER IN) Inhale into the lungs.   Yes Historical Provider, MD  vitamin B-12 (CYANOCOBALAMIN) 1000 MCG tablet Take 1,000 mcg by mouth 2 (two) times daily.   Yes Historical Provider, MD  atorvastatin (LIPITOR) 40 MG tablet TAKE ONE TABLET BY MOUTH ONCE DAILY. (PLEASE SCHEDULE APPOINTMENT PER PRESCRIBER) 06/19/15   Duanne Limerick, MD  clindamycin (CLEOCIN) 300 MG capsule Take 1 capsule  (300 mg total) by mouth 3 (three) times daily. 08/15/15   Barbaraann Barthel, NP  hydrochlorothiazide (HYDRODIURIL) 25 MG tablet TAKE ONE TABLET BY MOUTH ONCE DAILY 08/12/15   Duanne Limerick, MD  mupirocin ointment (BACTROBAN) 2 % Apply 1 application topically 2 (two) times daily. 08/15/15   Barbaraann Barthel, NP   Meds Ordered and Administered this Visit  Medications - No data to display  BP 155/69 mmHg  Pulse 80  Temp(Src) 98.2 F (36.8 C) (Oral)  Resp 16  Ht 5\' 2"  (1.575 m)  Wt 135 lb (61.236 kg)  BMI 24.69 kg/m2  SpO2 99% No data found.   Physical Exam  Constitutional: She is oriented to person, place, and time. Vital signs are normal. She appears well-developed and well-nourished. No distress.  HENT:  Head: Normocephalic and atraumatic.  Right Ear: Hearing, tympanic membrane, external ear and ear canal normal.  Left Ear: Hearing, tympanic membrane, external ear and ear canal normal.  Nose: Nose normal. No mucosal edema, rhinorrhea, nose lacerations, sinus tenderness, nasal deformity, septal deviation or nasal septal hematoma. No epistaxis.  No foreign bodies. Right  sinus exhibits no maxillary sinus tenderness and no frontal sinus tenderness. Left sinus exhibits no maxillary sinus tenderness and no frontal sinus tenderness.  Mouth/Throat: Uvula is midline, oropharynx is clear and moist and mucous membranes are normal. She does not have dentures. No oral lesions. No trismus in the jaw. Normal dentition. No dental abscesses, uvula swelling, lacerations or dental caries. No oropharyngeal exudate.  Eyes: Conjunctivae, EOM and lids are normal. Pupils are equal, round, and reactive to light. Right eye exhibits no discharge. Left eye exhibits no discharge. No scleral icterus.  Neck: Trachea normal and normal range of motion. Neck supple. No tracheal deviation present.  Cardiovascular: Normal rate, regular rhythm and intact distal pulses.   Pulmonary/Chest: Effort normal and breath sounds normal.  No stridor. No respiratory distress. She has no wheezes. She has no rales.  Abdominal: Soft. She exhibits no distension.  Musculoskeletal: Normal range of motion. She exhibits edema and tenderness.       Right shoulder: Normal.       Left shoulder: Normal.       Right elbow: Normal.      Left elbow: Normal.       Right wrist: Normal.       Left wrist: Normal.       Right hip: She exhibits tenderness and swelling. She exhibits normal range of motion, normal strength, no bony tenderness, no crepitus, no deformity and no laceration.       Left hip: Normal.       Right knee: Normal.       Left knee: Normal.       Right ankle: Normal.       Left ankle: Normal.       Cervical back: Normal.       Lumbar back: She exhibits pain. She exhibits normal range of motion, no tenderness, no bony tenderness, no swelling, no edema, no deformity, no laceration, no spasm and normal pulse.       Right upper arm: She exhibits tenderness. She exhibits no bony tenderness, no swelling, no edema, no deformity and no laceration.       Left upper arm: Normal.       Right forearm: Normal.       Left forearm: Normal.       Right hand: Normal.       Left hand: Normal.       Right upper leg: Normal.       Left upper leg: Normal.       Right lower leg: Normal.       Left lower leg: She exhibits tenderness, swelling, edema and laceration. She exhibits no bony tenderness and no deformity.       Legs:      Right foot: Normal.       Left foot: Normal.  Central anterior shin left leg with dermal laceration/abrasion "skinning" of top layer dermis; deepest distal shin and totally denuded midway; dried blood mild oozing after irrigation and hibiclens cleaning by RN Lia Foyer; ecchymosis surrounding laceration/abrasion with nonpitting edema 1+/4;  Irrigated with sterile saline then lidocaine 1% 104ml applied 12 steristrips to approximate skin and wound edges where possible then applied triple antibiotic, kerlex wrap secured  by cobain.  Dressing clean dry and intact on discharge ambulatory with friend driving her home no limp noted in hallway with full weight bearing bilateral legs  Lymphadenopathy:    She has no cervical adenopathy.  Neurological: She is alert and oriented to person, place, and  time. She has normal reflexes. She is not disoriented. She displays no atrophy and no tremor. No cranial nerve deficit or sensory deficit. She exhibits normal muscle tone. She displays no seizure activity. Coordination and gait normal. GCS eye subscore is 4. GCS verbal subscore is 5. GCS motor subscore is 6.  Bilateral grip strength hand equal; extremities 5/5 strength bilaterally equal  Skin: Skin is warm and dry. Abrasion, bruising, ecchymosis and laceration noted. No burn, no lesion, no petechiae and no rash noted. She is not diaphoretic. There is erythema. No cyanosis. No pallor. Nails show no clubbing.  Psychiatric: She has a normal mood and affect. Her speech is normal and behavior is normal. Judgment and thought content normal. Cognition and memory are normal.  Nursing note and vitals reviewed.   ED Course  Procedures (including critical care time)  Labs Review Labs Reviewed - No data to display  Imaging Review No results found.  1605 Discussed tetanus status confirmed with PCM office via telephone.  Discussed imaging with patient and opted to hold at this time able to bear full weight ambulatory immediately after fall and pain decreased with rest, ice, no deformity bone on exam and full AROM all extremities.  Patient verbalized understanding of information and agreed with plan of care.  MDM   1. Laceration of leg excluding thigh, left, initial encounter   2. Abrasion   3. Contusion    Patient was instructed to rest, ice and elevate left leg.  Do not soak leg until lacerations healed avoid pool, lake, ocean, or hot tub, bathtub water.  Exitcare handout on contusion, laceration, abrasion given to patient.    Medications as directed. bactroban to affected areas laceration/abrasion twice a day.  Cleocin 300mg  po x 7 days as large laceration, diabetic, allergic to penicillin, going to ocean beach vacation x 1 week starting tomorrow.  Bactrim and keflex interactions with her chronic medications. Keep abraded area covered.  Steristrips should remain in place 7  - 10 days may replace them if falls off early.  Daily shower and dressing change minimum daily after beach time completed.  Do not wade in ocean or kneel in sand. Monitor for purulent discharge, worsening glucometer readings, fever greater than 100.5, worsening pain/swelling after initial 48 hours, erythematous streaks up leg above knee.  Consider imaging if worsening or nonrelieving pain with tylenol, ice, rest over the next 2 weeks.  Bone and soft tissue contusion.   Call or return to clinic as needed if these symptoms worsen or fail to improve as anticipated and will consider imaging and orthopedics evaluation.  Patient verbalized agreement and understanding of treatment plan and had no further questions at this time.  P2:  ROM, injury prevention    , NP 08/16/15 2225

## 2015-08-25 ENCOUNTER — Other Ambulatory Visit: Payer: Self-pay | Admitting: Family Medicine

## 2015-09-01 ENCOUNTER — Ambulatory Visit (INDEPENDENT_AMBULATORY_CARE_PROVIDER_SITE_OTHER): Payer: Medicare Other

## 2015-09-01 DIAGNOSIS — Z23 Encounter for immunization: Secondary | ICD-10-CM | POA: Diagnosis not present

## 2015-09-17 ENCOUNTER — Other Ambulatory Visit: Payer: Self-pay | Admitting: Family Medicine

## 2015-09-17 DIAGNOSIS — I10 Essential (primary) hypertension: Secondary | ICD-10-CM

## 2015-09-17 DIAGNOSIS — E785 Hyperlipidemia, unspecified: Secondary | ICD-10-CM

## 2015-10-09 DIAGNOSIS — E785 Hyperlipidemia, unspecified: Secondary | ICD-10-CM | POA: Diagnosis not present

## 2015-10-09 DIAGNOSIS — E1129 Type 2 diabetes mellitus with other diabetic kidney complication: Secondary | ICD-10-CM | POA: Diagnosis not present

## 2015-10-09 DIAGNOSIS — R809 Proteinuria, unspecified: Secondary | ICD-10-CM | POA: Diagnosis not present

## 2015-10-13 ENCOUNTER — Other Ambulatory Visit: Payer: Self-pay | Admitting: Family Medicine

## 2015-10-13 DIAGNOSIS — I10 Essential (primary) hypertension: Secondary | ICD-10-CM

## 2015-10-16 ENCOUNTER — Ambulatory Visit (INDEPENDENT_AMBULATORY_CARE_PROVIDER_SITE_OTHER): Payer: Medicare Other | Admitting: Family Medicine

## 2015-10-16 ENCOUNTER — Encounter: Payer: Self-pay | Admitting: Family Medicine

## 2015-10-16 ENCOUNTER — Ambulatory Visit: Payer: Medicare Other | Admitting: Family Medicine

## 2015-10-16 VITALS — BP 122/78 | HR 64 | Ht 62.0 in | Wt 133.0 lb

## 2015-10-16 DIAGNOSIS — E1129 Type 2 diabetes mellitus with other diabetic kidney complication: Secondary | ICD-10-CM | POA: Diagnosis not present

## 2015-10-16 DIAGNOSIS — E876 Hypokalemia: Secondary | ICD-10-CM | POA: Diagnosis not present

## 2015-10-16 DIAGNOSIS — E875 Hyperkalemia: Secondary | ICD-10-CM | POA: Diagnosis not present

## 2015-10-16 DIAGNOSIS — E781 Pure hyperglyceridemia: Secondary | ICD-10-CM | POA: Diagnosis not present

## 2015-10-16 DIAGNOSIS — I10 Essential (primary) hypertension: Secondary | ICD-10-CM | POA: Diagnosis not present

## 2015-10-16 DIAGNOSIS — E785 Hyperlipidemia, unspecified: Secondary | ICD-10-CM

## 2015-10-16 DIAGNOSIS — F419 Anxiety disorder, unspecified: Secondary | ICD-10-CM | POA: Diagnosis not present

## 2015-10-16 MED ORDER — ATORVASTATIN CALCIUM 40 MG PO TABS: 40.0000 mg | ORAL_TABLET | Freq: Every day | ORAL | Status: DC

## 2015-10-16 MED ORDER — ALPRAZOLAM 0.25 MG PO TABS: 0.2500 mg | ORAL_TABLET | Freq: Two times a day (BID) | ORAL | Status: DC | PRN

## 2015-10-16 MED ORDER — HYDROCHLOROTHIAZIDE 25 MG PO TABS: 25.0000 mg | ORAL_TABLET | Freq: Every day | ORAL | Status: DC

## 2015-10-16 MED ORDER — LOSARTAN POTASSIUM 50 MG PO TABS: 50.0000 mg | ORAL_TABLET | Freq: Every day | ORAL | Status: DC

## 2015-10-16 MED ORDER — GEMFIBROZIL 600 MG PO TABS: 600.0000 mg | ORAL_TABLET | Freq: Two times a day (BID) | ORAL | Status: DC

## 2015-10-16 MED ORDER — POTASSIUM CHLORIDE 20 MEQ PO PACK: 20.0000 meq | PACK | Freq: Two times a day (BID) | ORAL | Status: DC

## 2015-10-16 NOTE — Progress Notes (Signed)
Name: Judy Bolton   MRN: [id number]    DOB: Apr 06, 1946   Date:10/16/2015       Progress Note  Subjective  Chief Complaint  Chief Complaint  Patient presents with  . Hypertension  . Hyperlipidemia  . hypokalemia    last K+- 5.3    Hypertension This is a chronic problem. The current episode started more than 1 year ago. The problem has been waxing and waning since onset. The problem is controlled. Pertinent negatives include no anxiety, blurred vision, chest pain, headaches, malaise/fatigue, neck pain, orthopnea, palpitations, peripheral edema, PND, shortness of breath or sweats. There are no associated agents to hypertension. There are no known risk factors for coronary artery disease. Past treatments include diuretics. The current treatment provides no improvement. There are no compliance problems.  Hypertensive end-organ damage includes angina, kidney disease, CAD/MI, CVA, heart failure, left ventricular hypertrophy, PVD, renovascular disease and retinopathy. Identifiable causes of hypertension include chronic renal disease and a hypertension causing med.  Hyperlipidemia This is a chronic problem. The current episode started more than 1 year ago. The problem is controlled. Recent lipid tests were reviewed and are normal. Exacerbating diseases include chronic renal disease. Pertinent negatives include no chest pain or shortness of breath. Current antihyperlipidemic treatment includes diet change and statins. The current treatment provides moderate improvement of lipids. There are no compliance problems.  Risk factors for coronary artery disease include hypertension and dyslipidemia.  Other This is a recurrent (hypokalemia) problem. The current episode started 1 to 4 weeks ago. The problem occurs daily. The problem has been waxing and waning. Pertinent negatives include no abdominal pain, anorexia, arthralgias, chest pain, congestion, coughing, diaphoresis, headaches, neck pain, rash, sore  throat or swollen glands. Nothing aggravates the symptoms. She has tried acetaminophen for the symptoms. The treatment provided mild relief.    No problem-specific assessment & plan notes found for this encounter.   Past Medical History  Diagnosis Date  . Hypertension   . Diabetes mellitus without complication (HCC)   . Hyperlipidemia   . Back pain   . Asthma   . Acid reflux   . Back pain   . Arthritis     Past Surgical History  Procedure Laterality Date  . Abdominal hysterectomy    . Rotator cuff repair Right   . Appendectomy      Family History  Problem Relation Age of Onset  . Heart attack Mother   . Cancer Father     Social History   Social History  . Marital Status: Widowed    Spouse Name: N/A  . Number of Children: N/A  . Years of Education: N/A   Occupational History  . Not on file.   Social History Main Topics  . Smoking status: Former Smoker    Quit date: 08/14/2000  . Smokeless tobacco: Not on file  . Alcohol Use: Yes  . Drug Use: No  . Sexual Activity: Not Currently   Other Topics Concern  . Not on file   Social History Narrative    Allergies  Allergen Reactions  . Codeine Itching  . Penicillins Other (See Comments)    Paralysis     Review of Systems  Constitutional: Negative for malaise/fatigue and diaphoresis.  HENT: Negative for congestion and sore throat.   Eyes: Negative for blurred vision.  Respiratory: Negative for cough and shortness of breath.   Cardiovascular: Negative for chest pain, palpitations, orthopnea and PND.  Gastrointestinal: Negative for abdominal pain and anorexia.  Musculoskeletal: Negative  for arthralgias and neck pain.  Skin: Negative for rash.  Neurological: Negative for headaches.     Objective  Filed Vitals:   10/16/15 1022  BP: 122/78  Pulse: 64  Height: 5\' 2"  (1.575 m)  Weight: 133 lb (60.328 kg)    Physical Exam  Constitutional: She is well-developed, well-nourished, and in no distress.  No distress.  HENT:  Head: Normocephalic and atraumatic.  Right Ear: External ear normal.  Left Ear: External ear normal.  Nose: Nose normal.  Mouth/Throat: Oropharynx is clear and moist.  Eyes: Conjunctivae and EOM are normal. Pupils are equal, round, and reactive to light. Right eye exhibits no discharge. Left eye exhibits no discharge.  Neck: Normal range of motion. Neck supple. No JVD present. No thyromegaly present.  Cardiovascular: Normal rate, regular rhythm, normal heart sounds and intact distal pulses.  Exam reveals no gallop and no friction rub.   No murmur heard. Pulmonary/Chest: Effort normal and breath sounds normal.  Abdominal: Soft. Bowel sounds are normal. She exhibits no mass. There is no tenderness. There is no guarding.  Musculoskeletal: Normal range of motion. She exhibits no edema.  Lymphadenopathy:    She has no cervical adenopathy.  Neurological: She is alert. She has normal reflexes.  Skin: Skin is warm and dry. She is not diaphoretic.  Psychiatric: Mood and affect normal.      Assessment & Plan  Problem List Items Addressed This Visit    None    Visit Diagnoses    Essential hypertension    -  Primary    Relevant Medications    atorvastatin (LIPITOR) 40 MG tablet    gemfibrozil (LOPID) 600 MG tablet    hydrochlorothiazide (HYDRODIURIL) 25 MG tablet    losartan (COZAAR) 50 MG tablet    Other Relevant Orders    Renal Function Panel    Hyperlipidemia        Relevant Medications    atorvastatin (LIPITOR) 40 MG tablet    gemfibrozil (LOPID) 600 MG tablet    hydrochlorothiazide (HYDRODIURIL) 25 MG tablet    losartan (COZAAR) 50 MG tablet    Other Relevant Orders    Lipid Profile    Hypokalemia        Relevant Orders    Renal Function Panel    Acute anxiety        Relevant Medications    ALPRAZolam (XANAX) 0.25 MG tablet         Dr. [facility]  10/16/2015

## 2015-10-17 LAB — RENAL FUNCTION PANEL
ALBUMIN: 4.6 g/dL (ref 3.6–4.8)
BUN / CREAT RATIO: 28 — AB (ref 11–26)
BUN: 29 mg/dL — AB (ref 8–27)
CALCIUM: 10.2 mg/dL (ref 8.7–10.3)
CHLORIDE: 98 mmol/L (ref 97–106)
CO2: 23 mmol/L (ref 18–29)
Creatinine, Ser: 1.02 mg/dL — ABNORMAL HIGH (ref 0.57–1.00)
GFR calc Af Amer: 66 mL/min/{1.73_m2} (ref >59)
GFR calc non Af Amer: 57 mL/min/{1.73_m2} — ABNORMAL LOW (ref >59)
Glucose: 97 mg/dL (ref 65–99)
PHOSPHORUS: 3.3 mg/dL (ref 2.5–4.5)
Potassium: 6 mmol/L — ABNORMAL HIGH (ref 3.5–5.2)
Sodium: 141 mmol/L (ref 136–144)

## 2016-01-01 ENCOUNTER — Ambulatory Visit (INDEPENDENT_AMBULATORY_CARE_PROVIDER_SITE_OTHER): Payer: Medicare Other | Admitting: Family Medicine

## 2016-01-01 ENCOUNTER — Encounter: Payer: Self-pay | Admitting: Family Medicine

## 2016-01-01 VITALS — BP 130/80 | HR 80 | Temp 98.1°F | Resp 12 | Ht 62.0 in | Wt 136.0 lb

## 2016-01-01 DIAGNOSIS — J40 Bronchitis, not specified as acute or chronic: Secondary | ICD-10-CM | POA: Diagnosis not present

## 2016-01-01 DIAGNOSIS — J4520 Mild intermittent asthma, uncomplicated: Secondary | ICD-10-CM

## 2016-01-01 DIAGNOSIS — J0100 Acute maxillary sinusitis, unspecified: Secondary | ICD-10-CM | POA: Diagnosis not present

## 2016-01-01 DIAGNOSIS — J432 Centrilobular emphysema: Secondary | ICD-10-CM

## 2016-01-01 MED ORDER — ALBUTEROL SULFATE (2.5 MG/3ML) 0.083% IN NEBU: 2.5000 mg | INHALATION_SOLUTION | Freq: Four times a day (QID) | RESPIRATORY_TRACT | Status: DC | PRN

## 2016-01-01 MED ORDER — GUAIFENESIN-CODEINE 100-10 MG/5ML PO SOLN: 5.0000 mL | Freq: Three times a day (TID) | ORAL | Status: DC | PRN

## 2016-01-01 MED ORDER — AZITHROMYCIN 250 MG PO TABS: ORAL_TABLET | ORAL | Status: DC

## 2016-01-01 MED ORDER — BUDESONIDE-FORMOTEROL FUMARATE 160-4.5 MCG/ACT IN AERO: 2.0000 | INHALATION_SPRAY | Freq: Two times a day (BID) | RESPIRATORY_TRACT | Status: DC

## 2016-01-01 NOTE — Progress Notes (Signed)
Name: Judy Bolton   MRN: [id number]    DOB: 1946/01/16   Date:01/01/2016       Progress Note  Subjective  Chief Complaint  Chief Complaint  Patient presents with  . Sinusitis    cong, runny nose, wheezing, cough- thick production    Sinusitis This is a chronic problem. The current episode started 1 to 4 weeks ago. The problem has been waxing and waning since onset. There has been no fever. The fever has been present for 1 to 2 days. Associated symptoms include chills, congestion, coughing, headaches, shortness of breath, sinus pressure, sneezing, a sore throat and swollen glands. Pertinent negatives include no diaphoresis, ear pain, hoarse voice or neck pain. Past treatments include oral decongestants. The treatment provided no relief.  Cough This is a chronic problem. The current episode started 1 to 4 weeks ago. The problem has been waxing and waning. The cough is non-productive. Associated symptoms include chills, a fever, headaches, nasal congestion, postnasal drip, a sore throat, shortness of breath and wheezing. Pertinent negatives include no chest pain, ear pain, heartburn, hemoptysis, myalgias, rash or weight loss. She has tried a beta-agonist inhaler and OTC cough suppressant for the symptoms. The treatment provided moderate relief. Her past medical history is significant for asthma. There is no history of bronchiectasis, bronchitis, COPD, emphysema, environmental allergies or pneumonia.    No problem-specific assessment & plan notes found for this encounter.   Past Medical History  Diagnosis Date  . Hypertension   . Diabetes mellitus without complication (HCC)   . Hyperlipidemia   . Back pain   . Asthma   . Acid reflux   . Back pain   . Arthritis   . COPD (chronic obstructive pulmonary disease) [facility])     Past Surgical History  Procedure Laterality Date  . Abdominal hysterectomy    . Rotator cuff repair Right   . Appendectomy      Family History  Problem Relation  Age of Onset  . Heart attack Mother   . Cancer Father     Social History   Social History  . Marital Status: Widowed    Spouse Name: N/A  . Number of Children: N/A  . Years of Education: N/A   Occupational History  . Not on file.   Social History Main Topics  . Smoking status: Former Smoker    Quit date: 08/14/2000  . Smokeless tobacco: Not on file  . Alcohol Use: Yes  . Drug Use: No  . Sexual Activity: Not Currently   Other Topics Concern  . Not on file   Social History Narrative    Allergies  Allergen Reactions  . Codeine Itching  . Penicillins Other (See Comments)    Paralysis     Review of Systems  Constitutional: Positive for fever and chills. Negative for weight loss, malaise/fatigue and diaphoresis.  HENT: Positive for congestion, postnasal drip, sinus pressure, sneezing and sore throat. Negative for ear discharge, ear pain and hoarse voice.   Eyes: Negative for blurred vision.  Respiratory: Positive for cough, shortness of breath and wheezing. Negative for hemoptysis and sputum production.   Cardiovascular: Negative for chest pain, palpitations and leg swelling.  Gastrointestinal: Negative for heartburn, nausea, abdominal pain, diarrhea, constipation, blood in stool and melena.  Genitourinary: Negative for dysuria, urgency, frequency and hematuria.  Musculoskeletal: Negative for myalgias, back pain, joint pain and neck pain.  Skin: Negative for rash.  Neurological: Positive for headaches. Negative for dizziness, tingling, sensory change and focal  weakness.  Endo/Heme/Allergies: Negative for environmental allergies and polydipsia. Does not bruise/bleed easily.  Psychiatric/Behavioral: Negative for depression and suicidal ideas. The patient is not nervous/anxious and does not have insomnia.      Objective  Filed Vitals:   01/01/16 0850  BP: 130/80  Pulse: 80  Temp: 98.1 F (36.7 C)  Resp: 12  Height: 5\' 2"  (1.575 m)  Weight: 136 lb (61.689 kg)     Physical Exam  Constitutional: She is well-developed, well-nourished, and in no distress. No distress.  HENT:  Head: Normocephalic and atraumatic.  Right Ear: External ear normal.  Left Ear: External ear normal.  Nose: Nose normal.  Mouth/Throat: Oropharynx is clear and moist.  Eyes: Conjunctivae and EOM are normal. Pupils are equal, round, and reactive to light. Right eye exhibits no discharge. Left eye exhibits no discharge.  Neck: Normal range of motion. Neck supple. No JVD present. No thyromegaly present.  Cardiovascular: Normal rate, regular rhythm, normal heart sounds and intact distal pulses.  Exam reveals no gallop and no friction rub.   No murmur heard. Pulmonary/Chest: Effort normal and breath sounds normal.  Abdominal: Soft. Bowel sounds are normal. She exhibits no mass. There is no tenderness. There is no guarding.  Musculoskeletal: Normal range of motion. She exhibits no edema.  Lymphadenopathy:    She has no cervical adenopathy.  Neurological: She is alert. She has normal reflexes.  Skin: Skin is warm and dry. She is not diaphoretic.  Psychiatric: Mood and affect normal.      Assessment & Plan  Problem List Items Addressed This Visit    None    Visit Diagnoses    Acute maxillary sinusitis, recurrence not specified    -  Primary    Relevant Medications    azithromycin (ZITHROMAX) 250 MG tablet    guaiFENesin-codeine 100-10 MG/5ML syrup    Bronchitis        Asthma, mild intermittent, uncomplicated        Relevant Medications    budesonide-formoterol (SYMBICORT) 160-4.5 MCG/ACT inhaler    albuterol (PROVENTIL) (2.5 MG/3ML) 0.083% nebulizer solution    Centrilobular emphysema (HCC)        Relevant Medications    budesonide-formoterol (SYMBICORT) 160-4.5 MCG/ACT inhaler    azithromycin (ZITHROMAX) 250 MG tablet    albuterol (PROVENTIL) (2.5 MG/3ML) 0.083% nebulizer solution    guaiFENesin-codeine 100-10 MG/5ML syrup         Dr.  [facility]  01/01/2016

## 2016-01-13 ENCOUNTER — Other Ambulatory Visit: Payer: Self-pay

## 2016-01-23 ENCOUNTER — Encounter: Payer: Self-pay | Admitting: Family Medicine

## 2016-01-23 ENCOUNTER — Ambulatory Visit (INDEPENDENT_AMBULATORY_CARE_PROVIDER_SITE_OTHER): Payer: Medicare Other | Admitting: Family Medicine

## 2016-01-23 VITALS — BP 120/80 | HR 80 | Temp 98.1°F | Ht 66.0 in | Wt 137.0 lb

## 2016-01-23 DIAGNOSIS — J40 Bronchitis, not specified as acute or chronic: Secondary | ICD-10-CM | POA: Diagnosis not present

## 2016-01-23 DIAGNOSIS — F419 Anxiety disorder, unspecified: Secondary | ICD-10-CM

## 2016-01-23 DIAGNOSIS — J441 Chronic obstructive pulmonary disease with (acute) exacerbation: Secondary | ICD-10-CM | POA: Diagnosis not present

## 2016-01-23 DIAGNOSIS — F4321 Adjustment disorder with depressed mood: Secondary | ICD-10-CM

## 2016-01-23 MED ORDER — LEVOFLOXACIN 500 MG PO TABS: 500.0000 mg | ORAL_TABLET | Freq: Every day | ORAL | Status: DC

## 2016-01-23 MED ORDER — ALPRAZOLAM 0.25 MG PO TABS: 0.2500 mg | ORAL_TABLET | Freq: Two times a day (BID) | ORAL | Status: DC | PRN

## 2016-01-23 NOTE — Progress Notes (Signed)
Name: Judy Bolton   MRN: [id number]    DOB: Dec 01, 1946   Date:01/23/2016       Progress Note  Subjective  Chief Complaint  Chief Complaint  Patient presents with  . Sinusitis    cough and cong- started before going to beach- had a zpack 01/01/16    Sinusitis This is a recurrent problem. The current episode started in the past 7 days. The problem has been waxing and waning since onset. There has been no fever. Her pain is at a severity of 5/10. Associated symptoms include congestion, coughing and shortness of breath. Pertinent negatives include no chills, diaphoresis, ear pain, headaches, hoarse voice, neck pain, sinus pressure, sneezing, sore throat or swollen glands. Past treatments include acetaminophen (plus inhalers).    No problem-specific assessment & plan notes found for this encounter.   Past Medical History  Diagnosis Date  . Hypertension   . Diabetes mellitus without complication (HCC)   . Hyperlipidemia   . Back pain   . Asthma   . Acid reflux   . Back pain   . Arthritis   . COPD (chronic obstructive pulmonary disease) [facility])     Past Surgical History  Procedure Laterality Date  . Abdominal hysterectomy    . Rotator cuff repair Right   . Appendectomy      Family History  Problem Relation Age of Onset  . Heart attack Mother   . Cancer Father     Social History   Social History  . Marital Status: Widowed    Spouse Name: N/A  . Number of Children: N/A  . Years of Education: N/A   Occupational History  . Not on file.   Social History Main Topics  . Smoking status: Former Smoker    Quit date: 08/14/2000  . Smokeless tobacco: Not on file  . Alcohol Use: Yes  . Drug Use: No  . Sexual Activity: Not Currently   Other Topics Concern  . Not on file   Social History Narrative    Allergies  Allergen Reactions  . Codeine Itching  . Penicillins Other (See Comments)    Paralysis     Review of Systems  Constitutional: Negative for fever,  chills, weight loss, malaise/fatigue and diaphoresis.  HENT: Positive for congestion. Negative for ear discharge, ear pain, hoarse voice, sinus pressure, sneezing and sore throat.   Eyes: Negative for blurred vision.  Respiratory: Positive for cough and shortness of breath. Negative for sputum production and wheezing.   Cardiovascular: Negative for chest pain, palpitations and leg swelling.  Gastrointestinal: Negative for heartburn, nausea, abdominal pain, diarrhea, constipation, blood in stool and melena.  Genitourinary: Negative for dysuria, urgency, frequency and hematuria.  Musculoskeletal: Negative for myalgias, back pain, joint pain and neck pain.  Skin: Negative for rash.  Neurological: Negative for dizziness, tingling, sensory change, focal weakness and headaches.  Endo/Heme/Allergies: Negative for environmental allergies and polydipsia. Does not bruise/bleed easily.  Psychiatric/Behavioral: Negative for depression and suicidal ideas. The patient is not nervous/anxious and does not have insomnia.      Objective  Filed Vitals:   01/23/16 1052  BP: 120/80  Pulse: 80  Temp: 98.1 F (36.7 C)  TempSrc: Oral  Height: 5\' 6"  (1.676 m)  Weight: 137 lb (62.143 kg)  SpO2: 97%    Physical Exam  Constitutional: She is well-developed, well-nourished, and in no distress. No distress.  HENT:  Head: Normocephalic and atraumatic.  Right Ear: External ear normal.  Left Ear: External ear normal.  Nose: Nose normal.  Mouth/Throat: Oropharynx is clear and moist.  Eyes: Conjunctivae and EOM are normal. Pupils are equal, round, and reactive to light. Right eye exhibits no discharge. Left eye exhibits no discharge.  Neck: Normal range of motion. Neck supple. No JVD present. No thyromegaly present.  Cardiovascular: Normal rate, regular rhythm, normal heart sounds and intact distal pulses.  Exam reveals no gallop and no friction rub.   No murmur heard. Pulmonary/Chest: Effort normal. No  respiratory distress. She has wheezes. She has no rales.  Abdominal: Soft. Bowel sounds are normal. She exhibits no mass. There is no tenderness. There is no guarding.  Musculoskeletal: Normal range of motion. She exhibits no edema.  Lymphadenopathy:    She has no cervical adenopathy.  Neurological: She is alert. She has normal reflexes.  Skin: Skin is warm and dry. She is not diaphoretic.  Psychiatric: Mood and affect normal.  Nursing note and vitals reviewed.     Assessment & Plan  Problem List Items Addressed This Visit    None    Visit Diagnoses    Bronchitis    -  Primary    Relevant Medications    levofloxacin (LEVAQUIN) 500 MG tablet    Chronic obstructive pulmonary disease with acute exacerbation (HCC)        Relevant Medications    levofloxacin (LEVAQUIN) 500 MG tablet    Grief reaction        Acute anxiety        Relevant Medications    ALPRAZolam (XANAX) 0.25 MG tablet         Dr. [facility]  01/23/2016

## 2016-01-27 DIAGNOSIS — K5900 Constipation, unspecified: Secondary | ICD-10-CM | POA: Diagnosis not present

## 2016-01-27 DIAGNOSIS — K581 Irritable bowel syndrome with constipation: Secondary | ICD-10-CM | POA: Diagnosis not present

## 2016-02-29 ENCOUNTER — Encounter: Payer: Self-pay | Admitting: Emergency Medicine

## 2016-02-29 ENCOUNTER — Observation Stay
Admission: EM | Admit: 2016-02-29 | Discharge: 2016-03-02 | Disposition: A | Discharge status: Home / Self Care | Payer: Medicare Other | Attending: Internal Medicine | Admitting: Internal Medicine

## 2016-02-29 ENCOUNTER — Emergency Department: Payer: Medicare Other

## 2016-02-29 DIAGNOSIS — Z7951 Long term (current) use of inhaled steroids: Secondary | ICD-10-CM | POA: Insufficient documentation

## 2016-02-29 DIAGNOSIS — E119 Type 2 diabetes mellitus without complications: Secondary | ICD-10-CM | POA: Insufficient documentation

## 2016-02-29 DIAGNOSIS — K219 Gastro-esophageal reflux disease without esophagitis: Secondary | ICD-10-CM | POA: Insufficient documentation

## 2016-02-29 DIAGNOSIS — Z7982 Long term (current) use of aspirin: Secondary | ICD-10-CM | POA: Insufficient documentation

## 2016-02-29 DIAGNOSIS — I10 Essential (primary) hypertension: Secondary | ICD-10-CM | POA: Diagnosis not present

## 2016-02-29 DIAGNOSIS — R471 Dysarthria and anarthria: Secondary | ICD-10-CM | POA: Diagnosis not present

## 2016-02-29 DIAGNOSIS — Z91048 Other nonmedicinal substance allergy status: Secondary | ICD-10-CM | POA: Diagnosis not present

## 2016-02-29 DIAGNOSIS — Z87891 Personal history of nicotine dependence: Secondary | ICD-10-CM | POA: Diagnosis not present

## 2016-02-29 DIAGNOSIS — J45909 Unspecified asthma, uncomplicated: Secondary | ICD-10-CM | POA: Diagnosis not present

## 2016-02-29 DIAGNOSIS — Z79899 Other long term (current) drug therapy: Secondary | ICD-10-CM | POA: Diagnosis not present

## 2016-02-29 DIAGNOSIS — R4781 Slurred speech: Secondary | ICD-10-CM | POA: Diagnosis not present

## 2016-02-29 DIAGNOSIS — E871 Hypo-osmolality and hyponatremia: Secondary | ICD-10-CM | POA: Diagnosis not present

## 2016-02-29 DIAGNOSIS — J449 Chronic obstructive pulmonary disease, unspecified: Secondary | ICD-10-CM | POA: Diagnosis not present

## 2016-02-29 DIAGNOSIS — Z885 Allergy status to narcotic agent status: Secondary | ICD-10-CM | POA: Insufficient documentation

## 2016-02-29 DIAGNOSIS — Z91040 Latex allergy status: Secondary | ICD-10-CM | POA: Diagnosis not present

## 2016-02-29 DIAGNOSIS — M1990 Unspecified osteoarthritis, unspecified site: Secondary | ICD-10-CM | POA: Diagnosis not present

## 2016-02-29 DIAGNOSIS — I6522 Occlusion and stenosis of left carotid artery: Secondary | ICD-10-CM | POA: Diagnosis not present

## 2016-02-29 DIAGNOSIS — E860 Dehydration: Secondary | ICD-10-CM | POA: Insufficient documentation

## 2016-02-29 DIAGNOSIS — Z7984 Long term (current) use of oral hypoglycemic drugs: Secondary | ICD-10-CM | POA: Insufficient documentation

## 2016-02-29 DIAGNOSIS — Z90710 Acquired absence of both cervix and uterus: Secondary | ICD-10-CM | POA: Diagnosis not present

## 2016-02-29 DIAGNOSIS — E785 Hyperlipidemia, unspecified: Secondary | ICD-10-CM | POA: Insufficient documentation

## 2016-02-29 DIAGNOSIS — J329 Chronic sinusitis, unspecified: Secondary | ICD-10-CM | POA: Diagnosis not present

## 2016-02-29 DIAGNOSIS — G459 Transient cerebral ischemic attack, unspecified: Principal | ICD-10-CM

## 2016-02-29 DIAGNOSIS — R4701 Aphasia: Secondary | ICD-10-CM

## 2016-02-29 DIAGNOSIS — R479 Unspecified speech disturbances: Secondary | ICD-10-CM | POA: Diagnosis not present

## 2016-02-29 DIAGNOSIS — Z880 Allergy status to penicillin: Secondary | ICD-10-CM | POA: Insufficient documentation

## 2016-02-29 LAB — URINALYSIS COMPLETE WITH MICROSCOPIC (ARMC ONLY)
Bacteria, UA: NONE SEEN
Bilirubin Urine: NEGATIVE
Glucose, UA: NEGATIVE mg/dL
HGB URINE DIPSTICK: NEGATIVE
KETONES UR: NEGATIVE mg/dL
LEUKOCYTES UA: NEGATIVE
NITRITE: NEGATIVE
PH: 6 (ref 5.0–8.0)
PROTEIN: NEGATIVE mg/dL
RBC / HPF: NONE SEEN RBC/hpf (ref 0–5)
SPECIFIC GRAVITY, URINE: 1.004 — AB (ref 1.005–1.030)
Squamous Epithelial / LPF: NONE SEEN

## 2016-02-29 LAB — CBC WITH DIFFERENTIAL/PLATELET
BASOS ABS: 0.1 10*3/uL (ref 0–0.1)
BASOS PCT: 1 %
EOS PCT: 12 %
Eosinophils Absolute: 1.2 10*3/uL — ABNORMAL HIGH (ref 0–0.7)
HCT: 35.6 % (ref 35.0–47.0)
Hemoglobin: 11.9 g/dL — ABNORMAL LOW (ref 12.0–16.0)
Lymphocytes Relative: 28 %
Lymphs Abs: 2.8 10*3/uL (ref 1.0–3.6)
MCH: 27.4 pg (ref 26.0–34.0)
MCHC: 33.4 g/dL (ref 32.0–36.0)
MCV: 82 fL (ref 80.0–100.0)
MONO ABS: 0.7 10*3/uL (ref 0.2–0.9)
Monocytes Relative: 7 %
NEUTROS ABS: 5.2 10*3/uL (ref 1.4–6.5)
Neutrophils Relative %: 52 %
PLATELETS: 273 10*3/uL (ref 150–440)
RBC: 4.34 MIL/uL (ref 3.80–5.20)
RDW: 14.9 % — AB (ref 11.5–14.5)
WBC: 10.1 10*3/uL (ref 3.6–11.0)

## 2016-02-29 LAB — COMPREHENSIVE METABOLIC PANEL
ALBUMIN: 3.7 g/dL (ref 3.5–5.0)
ALT: 13 U/L — ABNORMAL LOW (ref 14–54)
AST: 22 U/L (ref 15–41)
Alkaline Phosphatase: 77 U/L (ref 38–126)
Anion gap: 7 (ref 5–15)
BUN: 29 mg/dL — AB (ref 6–20)
CHLORIDE: 104 mmol/L (ref 101–111)
CO2: 23 mmol/L (ref 22–32)
Calcium: 9.2 mg/dL (ref 8.9–10.3)
Creatinine, Ser: 1.11 mg/dL — ABNORMAL HIGH (ref 0.44–1.00)
GFR calc Af Amer: 58 mL/min — ABNORMAL LOW (ref >60)
GFR calc non Af Amer: 50 mL/min — ABNORMAL LOW (ref >60)
GLUCOSE: 116 mg/dL — AB (ref 65–99)
POTASSIUM: 4.6 mmol/L (ref 3.5–5.1)
Sodium: 134 mmol/L — ABNORMAL LOW (ref 135–145)
Total Bilirubin: 0.3 mg/dL (ref 0.3–1.2)
Total Protein: 7 g/dL (ref 6.5–8.1)

## 2016-02-29 LAB — TROPONIN I: Troponin I: <0.03 ng/mL (ref <0.031)

## 2016-02-29 LAB — GLUCOSE, CAPILLARY: GLUCOSE-CAPILLARY: 97 mg/dL (ref 65–99)

## 2016-02-29 MED ORDER — SODIUM CHLORIDE 0.9 % IV SOLN
INTRAVENOUS | Status: DC
  Administered 2016-03-01: via INTRAVENOUS

## 2016-02-29 MED ORDER — ASPIRIN 325 MG PO TABS
325.0000 mg | ORAL_TABLET | Freq: Every day | ORAL | Status: DC
  Administered 2016-03-01 – 2016-03-02 (×2): 325 mg via ORAL
  Filled 2016-02-29 (×2): qty 1

## 2016-02-29 MED ORDER — LORATADINE 10 MG PO TABS
10.0000 mg | ORAL_TABLET | Freq: Every day | ORAL | Status: DC
  Administered 2016-03-01 – 2016-03-02 (×2): 10 mg via ORAL
  Filled 2016-02-29 (×2): qty 1

## 2016-02-29 MED ORDER — TIOTROPIUM BROMIDE MONOHYDRATE 18 MCG IN CAPS
18.0000 ug | ORAL_CAPSULE | Freq: Every day | RESPIRATORY_TRACT | Status: DC
  Administered 2016-03-01 – 2016-03-02 (×2): 18 ug via RESPIRATORY_TRACT
  Filled 2016-02-29: qty 5

## 2016-02-29 MED ORDER — INSULIN ASPART 100 UNIT/ML ~~LOC~~ SOLN: 0.0000 [IU] | Freq: Three times a day (TID) | SUBCUTANEOUS | Status: DC

## 2016-02-29 MED ORDER — ASPIRIN 81 MG PO CHEW
324.0000 mg | CHEWABLE_TABLET | Freq: Once | ORAL | Status: AC
  Administered 2016-02-29: 324 mg via ORAL
  Filled 2016-02-29: qty 4

## 2016-02-29 MED ORDER — MOMETASONE FURO-FORMOTEROL FUM 200-5 MCG/ACT IN AERO
2.0000 | INHALATION_SPRAY | Freq: Two times a day (BID) | RESPIRATORY_TRACT | Status: DC
  Administered 2016-03-01 – 2016-03-02 (×3): 2 via RESPIRATORY_TRACT
  Filled 2016-02-29: qty 8.8

## 2016-02-29 MED ORDER — INSULIN ASPART 100 UNIT/ML ~~LOC~~ SOLN: 0.0000 [IU] | Freq: Every day | SUBCUTANEOUS | Status: DC

## 2016-02-29 MED ORDER — IPRATROPIUM-ALBUTEROL 0.5-2.5 (3) MG/3ML IN SOLN: 3.0000 mL | Freq: Four times a day (QID) | RESPIRATORY_TRACT | Status: DC | PRN

## 2016-02-29 MED ORDER — SODIUM CHLORIDE 0.9 % IV SOLN
1000.0000 mL | Freq: Once | INTRAVENOUS | Status: AC
  Administered 2016-02-29: 1000 mL via INTRAVENOUS

## 2016-02-29 MED ORDER — FAMOTIDINE 20 MG PO TABS
20.0000 mg | ORAL_TABLET | Freq: Every day | ORAL | Status: DC
  Administered 2016-03-01 – 2016-03-02 (×2): 20 mg via ORAL
  Filled 2016-02-29 (×2): qty 1

## 2016-02-29 MED ORDER — ENOXAPARIN SODIUM 40 MG/0.4ML ~~LOC~~ SOLN
40.0000 mg | SUBCUTANEOUS | Status: DC
  Administered 2016-03-01: 40 mg via SUBCUTANEOUS
  Filled 2016-02-29: qty 0.4

## 2016-02-29 MED ORDER — SENNOSIDES-DOCUSATE SODIUM 8.6-50 MG PO TABS: 1.0000 | ORAL_TABLET | Freq: Every evening | ORAL | Status: DC | PRN

## 2016-02-29 MED ORDER — FLUTICASONE PROPIONATE 50 MCG/ACT NA SUSP
2.0000 | Freq: Every day | NASAL | Status: DC
  Administered 2016-03-01: 21:00:00 2 via NASAL
  Filled 2016-02-29: qty 16

## 2016-02-29 MED ORDER — LOSARTAN POTASSIUM 50 MG PO TABS
50.0000 mg | ORAL_TABLET | Freq: Every day | ORAL | Status: DC
  Administered 2016-03-01 – 2016-03-02 (×2): 50 mg via ORAL
  Filled 2016-02-29 (×2): qty 1

## 2016-02-29 MED ORDER — LABETALOL HCL 5 MG/ML IV SOLN
10.0000 mg | Freq: Once | INTRAVENOUS | Status: AC
  Administered 2016-02-29: 10 mg via INTRAVENOUS
  Filled 2016-02-29: qty 4

## 2016-02-29 MED ORDER — ATORVASTATIN CALCIUM 20 MG PO TABS
40.0000 mg | ORAL_TABLET | Freq: Every day | ORAL | Status: DC
  Administered 2016-03-01: 17:00:00 40 mg via ORAL
  Filled 2016-02-29: qty 2

## 2016-02-29 MED ORDER — AZELASTINE HCL 0.1 % NA SOLN
2.0000 | NASAL | Status: DC
  Administered 2016-03-01 – 2016-03-02 (×2): 2 via NASAL
  Filled 2016-02-29: qty 30

## 2016-02-29 MED ORDER — PREDNISONE 10 MG PO TABS
5.0000 mg | ORAL_TABLET | Freq: Every day | ORAL | Status: DC
  Administered 2016-03-01 – 2016-03-02 (×2): 5 mg via ORAL
  Filled 2016-02-29 (×2): qty 1

## 2016-02-29 MED ORDER — ATORVASTATIN CALCIUM 20 MG PO TABS: 40.0000 mg | ORAL_TABLET | Freq: Every day | ORAL | Status: DC

## 2016-02-29 MED ORDER — ALPRAZOLAM 0.25 MG PO TABS
0.2500 mg | ORAL_TABLET | Freq: Two times a day (BID) | ORAL | Status: DC | PRN
  Administered 2016-03-01: 0.25 mg via ORAL
  Filled 2016-02-29 (×2): qty 1

## 2016-02-29 MED ORDER — ALBUTEROL SULFATE (2.5 MG/3ML) 0.083% IN NEBU: 3.0000 mL | INHALATION_SOLUTION | Freq: Four times a day (QID) | RESPIRATORY_TRACT | Status: DC | PRN

## 2016-02-29 MED ORDER — STROKE: EARLY STAGES OF RECOVERY BOOK
Freq: Once | Status: AC
  Administered 2016-03-01

## 2016-02-29 MED ORDER — ASPIRIN 300 MG RE SUPP: 300.0000 mg | Freq: Every day | RECTAL | Status: DC

## 2016-02-29 NOTE — ED Notes (Signed)
Patient transported to CT 

## 2016-02-29 NOTE — Care Management Obs Status (Signed)
MEDICARE OBSERVATION STATUS NOTIFICATION   Patient Details  Name: ASENATH BALASH MRN: [id number] Date of Birth: Oct 13, 1946   Medicare Observation Status Notification Given:  Yes    CrutchfieldDerrill Memo, RN 02/29/2016, 8:19 PM

## 2016-02-29 NOTE — ED Provider Notes (Addendum)
[facility] Emergency Department Provider Note     Time seen: ----------------------------------------- 7:10 PM on 02/29/2016 -----------------------------------------    I have reviewed the triage vital signs and the nursing notes.   HISTORY  Chief Complaint Code Stroke    HPI Judy Bolton is a 70 y.o. female who presents to ER after acute onset of not being able to her words out and not be able to find the right worse is a. She was also confused, onset was at 3:00 which is 4 hours ago. She denies any numbness tingling or weakness. Denies any facial droop, trouble swallowing or other complaints. Patient denies recent illness, denies anxiety.Currently symptoms have all but resolved   Past Medical History  Diagnosis Date  . Hypertension   . Diabetes mellitus without complication (HCC)   . Hyperlipidemia   . Back pain   . Asthma   . Acid reflux   . Back pain   . Arthritis   . COPD (chronic obstructive pulmonary disease) (HCC)     There are no active problems to display for this patient.   Past Surgical History  Procedure Laterality Date  . Abdominal hysterectomy    . Rotator cuff repair Right   . Appendectomy      Allergies Codeine and Penicillins  Social History Social History  Substance Use Topics  . Smoking status: Former Smoker    Quit date: 08/14/2000  . Smokeless tobacco: Not on file  . Alcohol Use: Yes    Review of Systems Constitutional: Negative for fever. Eyes: Negative for visual changes. ENT: Negative for sore throat. Cardiovascular: Negative for chest pain. Respiratory: Negative for shortness of breath. Gastrointestinal: Negative for abdominal pain, vomiting and diarrhea. Genitourinary: Negative for dysuria. Musculoskeletal: Negative for back pain. Skin: Negative for rash. Neurological: Positive for speech disturbance  10-point ROS otherwise  negative.  ____________________________________________   PHYSICAL EXAM:  VITAL SIGNS: ED Triage Vitals  Enc Vitals Group     BP --      Pulse Rate 02/29/16 1905 80     Resp 02/29/16 1905 17     Temp 02/29/16 1905 98.1 F (36.7 C)     Temp Source 02/29/16 1905 Oral     SpO2 02/29/16 1905 96 %     Weight --      Height --      Head Cir --      Peak Flow --      Pain Score --      Pain Loc --      Pain Edu? --      Excl. in GC? --     Constitutional: Alert and oriented. Anxious, no acute distress Eyes: Conjunctivae are normal. PERRL. Normal extraocular movements. ENT   Head: Normocephalic and atraumatic.   Nose: No congestion/rhinnorhea.   Mouth/Throat: Mucous membranes are moist.   Neck: No stridor. Cardiovascular: Normal rate, regular rhythm. Normal and symmetric distal pulses are present in all extremities. No murmurs, rubs, or gallops. Respiratory: Normal respiratory effort without tachypnea nor retractions. Breath sounds are clear and equal bilaterally. No wheezes/rales/rhonchi. Gastrointestinal: Soft and nontender. No distention. No abdominal bruits.  Musculoskeletal: Nontender with normal range of motion in all extremities. No joint effusions.  No lower extremity tenderness nor edema. Neurologic:  Normal speech and language. No gross focal neurologic deficits are appreciated. Speech is normal. No sensory or motor deficits are appreciated. Normal finger to nose test. Cranial nerves appear to be intact. Skin:  Skin is warm,  dry and intact. No rash noted. Psychiatric: Mood and affect are normal. Speech and behavior are normal. Patient exhibits appropriate insight and judgment. ____________________________________________  EKG: Interpreted by me. Sinus rhythm with a rate of 79 bpm, normal PR interval, normal QRS, normal QT interval. Normal axis.  ____________________________________________  ED COURSE:  Pertinent labs & imaging results that were available  during my care of the patient were reviewed by me and considered in my medical decision making (see chart for details). Patient is in no acute distress, will check basic labs and obtain CT imaging. Onset was 4 hours ago and she appears to have resolution in her speech trouble. This is likely a TIA. ____________________________________________    LABS (pertinent positives/negatives)  Labs Reviewed  CBC WITH DIFFERENTIAL/PLATELET - Abnormal; Notable for the following:    Hemoglobin 11.9 (*)    RDW 14.9 (*)    Eosinophils Absolute 1.2 (*)    All other components within normal limits  COMPREHENSIVE METABOLIC PANEL - Abnormal; Notable for the following:    Sodium 134 (*)    Glucose, Bld 116 (*)    BUN 29 (*)    Creatinine, Ser 1.11 (*)    ALT 13 (*)    GFR calc non Af Amer 50 (*)    GFR calc Af Amer 58 (*)    All other components within normal limits  TROPONIN I  URINALYSIS COMPLETEWITH MICROSCOPIC ([facility] ONLY)    RADIOLOGY Images were viewed by me  CT head  IMPRESSION: No acute intracranial abnormality noted.  Changes consistent with chronic sinusitis. ____________________________________________  FINAL ASSESSMENT AND PLAN  Aphasia, TIA  Plan: Patient with labs and imaging as dictated above. Patient is in no acute distress, and did not perceive any focal neurologic deficits at this time. We'll recommend admission and MRI. She'll be given an adult aspirin. Currently she is medically stable for admission without focal neurologic deficits.   Emily Filbert, MD   Emily Filbert, MD 02/29/16 1951  Emily Filbert, MD 02/29/16 2001

## 2016-02-29 NOTE — ED Notes (Signed)
Called 1C to give report x2.

## 2016-02-29 NOTE — H&P (Signed)
[facility] -  at [facility]   PATIENT NAME: Judy Bolton    MR#:  [id number]  DATE OF BIRTH:  07-23-1946  DATE OF ADMISSION:  02/29/2016  PRIMARY CARE PHYSICIAN: Elizabeth Sauer, MD   REQUESTING/REFERRING PHYSICIAN: Emily Filbert, MD  CHIEF COMPLAINT:   Chief Complaint  Patient presents with  . Code Stroke  Slurred speech for about 3 hours today.  HISTORY OF PRESENT ILLNESS:  Judy Bolton  is a 70 y.o. female with a known history of Hypertension, diabetes, COPD and hyperlipidemia. The patient was sent to ED due to difficulty finding words and the slurred speech for about 3 hours today. The onset was about 3 PM and resolved 3 hours later. She denies any headache, dizziness, numbness or focal weakness. No facial droop, dysphagia or incontinence. She said her blood sugar was more than 300 today. PAST MEDICAL HISTORY:   Past Medical History  Diagnosis Date  . Hypertension   . Diabetes mellitus without complication (HCC)   . Hyperlipidemia   . Back pain   . Asthma   . Acid reflux   . Back pain   . Arthritis   . COPD (chronic obstructive pulmonary disease) (HCC)     PAST SURGICAL HISTORY:   Past Surgical History  Procedure Laterality Date  . Abdominal hysterectomy    . Rotator cuff repair Right   . Appendectomy      SOCIAL HISTORY:   Social History  Substance Use Topics  . Smoking status: Former Smoker    Quit date: 08/14/2000  . Smokeless tobacco: Not on file  . Alcohol Use: Yes    FAMILY HISTORY:   Family History  Problem Relation Age of Onset  . Heart attack Mother   . Cancer Father     DRUG ALLERGIES:   Allergies  Allergen Reactions  . Codeine Itching  . Penicillins Other (See Comments)    Paralysis    REVIEW OF SYSTEMS:  CONSTITUTIONAL: No fever, fatigue or weakness.  EYES: No blurred or double vision.  EARS, NOSE, AND THROAT: No tinnitus or ear pain.  RESPIRATORY: No cough, shortness of breath, wheezing  or hemoptysis.  CARDIOVASCULAR: No chest pain, orthopnea, edema.  GASTROINTESTINAL: No nausea, vomiting, diarrhea or abdominal pain.  GENITOURINARY: No dysuria, hematuria.  ENDOCRINE: No polyuria, nocturia,  HEMATOLOGY: No anemia, easy bruising or bleeding SKIN: No rash or lesion. MUSCULOSKELETAL: No joint pain or arthritis.   NEUROLOGIC: No tingling, numbness, weakness. But has slurred speech. PSYCHIATRY: No anxiety or depression.   MEDICATIONS AT HOME:   Prior to Admission medications   Medication Sig Start Date End Date Taking? Authorizing Provider  cetirizine (ZYRTEC) 10 MG tablet Take 10 mg by mouth daily. 01/22/16  Yes Historical Provider, MD  potassium chloride SA (K-DUR,KLOR-CON) 20 MEQ tablet Take 20 mEq by mouth 2 (two) times daily.   Yes Historical Provider, MD  VICTOZA 18 MG/3ML SOPN Inject 1.8 mg into the skin daily. 02/16/16  Yes Historical Provider, MD  albuterol (PROVENTIL) (2.5 MG/3ML) 0.083% nebulizer solution Take 3 mLs (2.5 mg total) by nebulization every 6 (six) hours as needed for wheezing or shortness of breath. 01/01/16   Duanne Limerick, MD  Albuterol Sulfate (VENTOLIN HFA IN) Inhale into the lungs.    Historical Provider, MD  ALPRAZolam Prudy Feeler) 0.25 MG tablet Take 1 tablet (0.25 mg total) by mouth 2 (two) times daily as needed for anxiety. 01/23/16   Duanne Limerick, MD  aspirin 81 MG tablet Take  81 mg by mouth daily.    Historical Provider, MD  atorvastatin (LIPITOR) 40 MG tablet Take 1 tablet (40 mg total) by mouth daily. 10/16/15   Duanne Limerick, MD  azelastine (ASTELIN) 0.1 % nasal spray Place into both nostrils 2 (two) times daily. Use in each nostril as directed    Historical Provider, MD  budesonide-formoterol (SYMBICORT) 160-4.5 MCG/ACT inhaler Inhale 2 puffs into the lungs 2 (two) times daily. 01/01/16   Duanne Limerick, MD  etodolac (LODINE) 500 MG tablet TAKE ONE TABLET BY MOUTH ONCE DAILY 08/26/15   Duanne Limerick, MD  gemfibrozil (LOPID) 600 MG tablet Take 1  tablet (600 mg total) by mouth 2 (two) times daily. 10/16/15   Duanne Limerick, MD  hydrochlorothiazide (HYDRODIURIL) 25 MG tablet Take 1 tablet (25 mg total) by mouth daily. 10/16/15   Duanne Limerick, MD  levofloxacin (LEVAQUIN) 500 MG tablet Take 1 tablet (500 mg total) by mouth daily. 01/23/16   Duanne Limerick, MD  losartan (COZAAR) 50 MG tablet Take 1 tablet (50 mg total) by mouth daily. 10/16/15   Duanne Limerick, MD  metFORMIN (GLUCOPHAGE) 1000 MG tablet Take 1,000 mg by mouth 2 (two) times daily with a meal.    Historical Provider, MD  potassium chloride (KLOR-CON) 20 MEQ packet Take 20 mEq by mouth 2 (two) times daily. 10/16/15   Duanne Limerick, MD  predniSONE (DELTASONE) 1 MG tablet Take 5 mg by mouth daily with breakfast.    Historical Provider, MD  Tiotropium Bromide Monohydrate (SPIRIVA HANDIHALER IN) Inhale into the lungs. Reported on 01/01/2016    Historical Provider, MD  vitamin B-12 (CYANOCOBALAMIN) 1000 MCG tablet Take 1,000 mcg by mouth 2 (two) times daily.    Historical Provider, MD      VITAL SIGNS:  Blood pressure 169/94, pulse 81, temperature 98.4 F (36.9 C), temperature source Oral, resp. rate 19, SpO2 95 %.  PHYSICAL EXAMINATION:  GENERAL:  70 y.o.-year-old patient lying in the bed with no acute distress.  EYES: Pupils equal, round, reactive to light and accommodation. No scleral icterus. Extraocular muscles intact.  HEENT: Head atraumatic, normocephalic. Oropharynx and nasopharynx clear. Moist oral mucosa. NECK:  Supple, no jugular venous distention. No thyroid enlargement, no tenderness.  LUNGS: Normal breath sounds bilaterally, no wheezing, rales,rhonchi or crepitation. No use of accessory muscles of respiration.  CARDIOVASCULAR: S1, S2 normal. No murmurs, rubs, or gallops.  ABDOMEN: Soft, nontender, nondistended. Bowel sounds present. No organomegaly or mass.  EXTREMITIES: No pedal edema, cyanosis, or clubbing.  NEUROLOGIC: Cranial nerves II through XII are intact.  Muscle strength 5/5 in all extremities. Sensation intact. Gait not checked.  PSYCHIATRIC: The patient is alert and oriented x 3.  SKIN: No obvious rash, lesion, or ulcer.   LABORATORY PANEL:   CBC  Recent Labs Lab 02/29/16 1912  WBC 10.1  HGB 11.9*  HCT 35.6  PLT 273   ------------------------------------------------------------------------------------------------------------------  Chemistries   Recent Labs Lab 02/29/16 1912  NA 134*  K 4.6  CL 104  CO2 23  GLUCOSE 116*  BUN 29*  CREATININE 1.11*  CALCIUM 9.2  AST 22  ALT 13*  ALKPHOS 77  BILITOT 0.3   ------------------------------------------------------------------------------------------------------------------  Cardiac Enzymes  Recent Labs Lab 02/29/16 1912  TROPONINI <0.03   ------------------------------------------------------------------------------------------------------------------  RADIOLOGY:  Ct Head Wo Contrast  02/29/2016  CLINICAL DATA:  Speech difficulties EXAM: CT HEAD WITHOUT CONTRAST TECHNIQUE: Contiguous axial images were obtained from the base of the skull through the  vertex without intravenous contrast. COMPARISON:  None. FINDINGS: The bony calvarium is intact. No gross soft tissue abnormality is noted. Considerable soft tissue changes are noted within the ethmoid, sphenoid and maxillary sinuses consistent with chronic sinusitis. No findings to suggest acute hemorrhage, acute infarction or space-occupying mass lesion are noted. IMPRESSION: No acute intracranial abnormality noted. Changes consistent with chronic sinusitis. Electronically Signed   By: Alcide Clever M.D.   On: 02/29/2016 19:47    EKG:   Orders placed or performed during the hospital encounter of 02/29/16  . EKG 12-Lead  . EKG 12-Lead  . ED EKG  . ED EKG    IMPRESSION AND PLAN:   TIA The patient will be placed for observation. I will get an MRI/MRA of the brain, echocardiogram and carotid duplex. Continue aspirin  and Lipitor. Neuro check, PT evaluation and speech study.  Dehydration and mild hyponatremia. Start normal saline and follow-up BMP.  Hypertension. Continue hypertension medication and IV hydralazine when necessary.  Diabetes 2. Start sliding scale, hold by mouth medications.  COPD. Stable. DuoNeb when necessary.  All the records are reviewed and case discussed with ED provider. Management plans discussed with the patient, family and they are in agreement.  CODE STATUS: Full Code  TOTAL TIME TAKING CARE OF THIS PATIENT: 47 minutes.    Shaune Pollack M.D on 02/29/2016 at 8:15 PM  Between 7am to 6pm - Pager - [phone]  After 6pm go to [email or web address] - password [email or web address] [facility]  [place] [facility]  Office  [phone]  CC: Primary care physician; Elizabeth Sauer, MD

## 2016-02-29 NOTE — ED Notes (Signed)
Called 1C to give report x1.

## 2016-03-01 ENCOUNTER — Observation Stay: Payer: Medicare Other

## 2016-03-01 ENCOUNTER — Observation Stay
Admit: 2016-03-01 | Discharge: 2016-03-01 | Disposition: A | Discharge status: Home / Self Care | Payer: Medicare Other | Attending: Internal Medicine | Admitting: Internal Medicine

## 2016-03-01 DIAGNOSIS — I6523 Occlusion and stenosis of bilateral carotid arteries: Secondary | ICD-10-CM | POA: Diagnosis not present

## 2016-03-01 DIAGNOSIS — E860 Dehydration: Secondary | ICD-10-CM | POA: Diagnosis not present

## 2016-03-01 DIAGNOSIS — G459 Transient cerebral ischemic attack, unspecified: Secondary | ICD-10-CM | POA: Diagnosis not present

## 2016-03-01 DIAGNOSIS — E119 Type 2 diabetes mellitus without complications: Secondary | ICD-10-CM | POA: Diagnosis not present

## 2016-03-01 DIAGNOSIS — R479 Unspecified speech disturbances: Secondary | ICD-10-CM | POA: Diagnosis not present

## 2016-03-01 DIAGNOSIS — I10 Essential (primary) hypertension: Secondary | ICD-10-CM | POA: Diagnosis not present

## 2016-03-01 LAB — BASIC METABOLIC PANEL
Anion gap: 6 (ref 5–15)
BUN: 25 mg/dL — AB (ref 6–20)
CO2: 22 mmol/L (ref 22–32)
CREATININE: 0.92 mg/dL (ref 0.44–1.00)
Calcium: 9 mg/dL (ref 8.9–10.3)
Chloride: 111 mmol/L (ref 101–111)
GFR calc Af Amer: >60 mL/min (ref >60)
GLUCOSE: 84 mg/dL (ref 65–99)
POTASSIUM: 4.1 mmol/L (ref 3.5–5.1)
SODIUM: 139 mmol/L (ref 135–145)

## 2016-03-01 LAB — GLUCOSE, CAPILLARY
GLUCOSE-CAPILLARY: 107 mg/dL — AB (ref 65–99)
GLUCOSE-CAPILLARY: 125 mg/dL — AB (ref 65–99)
Glucose-Capillary: 84 mg/dL (ref 65–99)
Glucose-Capillary: 114 mg/dL — ABNORMAL HIGH (ref 65–99)

## 2016-03-01 LAB — LIPID PANEL
CHOLESTEROL: 141 mg/dL (ref 0–200)
HDL: 51 mg/dL (ref >40)
LDL CALC: 70 mg/dL (ref 0–99)
TRIGLYCERIDES: 98 mg/dL (ref <150)
Total CHOL/HDL Ratio: 2.8 RATIO
VLDL: 20 mg/dL (ref 0–40)

## 2016-03-01 LAB — ECHOCARDIOGRAM COMPLETE
HEIGHTINCHES: 61 in
Weight: 2108.8 oz

## 2016-03-01 LAB — HEMOGLOBIN A1C: Hgb A1c MFr Bld: 5.7 % (ref 4.0–6.0)

## 2016-03-01 MED ORDER — METOPROLOL TARTRATE 25 MG PO TABS
25.0000 mg | ORAL_TABLET | Freq: Two times a day (BID) | ORAL | Status: DC
  Administered 2016-03-01 – 2016-03-02 (×3): 25 mg via ORAL
  Filled 2016-03-01 (×3): qty 1

## 2016-03-01 NOTE — Progress Notes (Signed)
[facility] - [place] at [facility]   PATIENT NAME: Judy Bolton    MR#:  [id number]  DATE OF BIRTH:  24-Dec-1945  SUBJECTIVE:  CHIEF COMPLAINT:  Patient is resting comfortably. Denies any headache or blurry vision. Intermittent episodes of dysarthria. Bedside swallow evaluation was normal  REVIEW OF SYSTEMS:  CONSTITUTIONAL: No fever, fatigue or weakness.  EYES: No blurred or double vision.  EARS, NOSE, AND THROAT: No tinnitus or ear pain.  RESPIRATORY: No cough, shortness of breath, wheezing or hemoptysis.  CARDIOVASCULAR: No chest pain, orthopnea, edema.  GASTROINTESTINAL: No nausea, vomiting, diarrhea or abdominal pain.  GENITOURINARY: No dysuria, hematuria.  ENDOCRINE: No polyuria, nocturia,  HEMATOLOGY: No anemia, easy bruising or bleeding SKIN: No rash or lesion. MUSCULOSKELETAL: No joint pain or arthritis.   NEUROLOGIC: No tingling, numbness, weakness.  PSYCHIATRY: No anxiety or depression.   DRUG ALLERGIES:   Allergies  Allergen Reactions  . Codeine Itching  . Iodine Swelling  . Latex     Other reaction(s): Other (See Comments)  . Penicillins Other (See Comments)    Paralysis Has patient had a PCN reaction causing immediate rash, facial/tongue/throat swelling, SOB or lightheadedness with hypotension: No Has patient had a PCN reaction causing severe rash involving mucus membranes or skin necrosis: No Has patient had a PCN reaction that required hospitalization Yes Has patient had a PCN reaction occurring within the last 10 years: No If all of the above answers are "NO", then may proceed with Cephalosporin use.    VITALS:  Blood pressure 188/81, pulse 81, temperature 97.5 F (36.4 C), temperature source Oral, resp. rate 20, height 5\' 1"  (1.549 m), weight 59.784 kg (131 lb 12.8 oz), SpO2 98 %.  PHYSICAL EXAMINATION:  GENERAL:  70 y.o.-year-old patient lying in the bed with no acute distress.  EYES: Pupils equal, round, reactive to light  and accommodation. No scleral icterus. Extraocular muscles intact.  HEENT: Head atraumatic, normocephalic. Oropharynx and nasopharynx clear.  NECK:  Supple, no jugular venous distention. No thyroid enlargement, no tenderness.  LUNGS: Normal breath sounds bilaterally, no wheezing, rales,rhonchi or crepitation. No use of accessory muscles of respiration.  CARDIOVASCULAR: S1, S2 normal. No murmurs, rubs, or gallops.  ABDOMEN: Soft, nontender, nondistended. Bowel sounds present. No organomegaly or mass.  EXTREMITIES: No pedal edema, cyanosis, or clubbing.  NEUROLOGIC: Cranial nerves II through XII are intact. Muscle strength 5/5 in all extremities. Sensation intact. Gait not checked.  PSYCHIATRIC: The patient is alert and oriented x 3.  SKIN: No obvious rash, lesion, or ulcer.    LABORATORY PANEL:   CBC  Recent Labs Lab 02/29/16 1912  WBC 10.1  HGB 11.9*  HCT 35.6  PLT 273   ------------------------------------------------------------------------------------------------------------------  Chemistries   Recent Labs Lab 02/29/16 1912 03/01/16 0458  NA 134* 139  K 4.6 4.1  CL 104 111  CO2 23 22  GLUCOSE 116* 84  BUN 29* 25*  CREATININE 1.11* 0.92  CALCIUM 9.2 9.0  AST 22  --   ALT 13*  --   ALKPHOS 77  --   BILITOT 0.3  --    ------------------------------------------------------------------------------------------------------------------  Cardiac Enzymes  Recent Labs Lab 02/29/16 1912  TROPONINI <0.03   ------------------------------------------------------------------------------------------------------------------  RADIOLOGY:  Ct Head Wo Contrast  02/29/2016  CLINICAL DATA:  Speech difficulties EXAM: CT HEAD WITHOUT CONTRAST TECHNIQUE: Contiguous axial images were obtained from the base of the skull through the vertex without intravenous contrast. COMPARISON:  None. FINDINGS: The bony calvarium is intact. No gross soft  tissue abnormality is noted. Considerable  soft tissue changes are noted within the ethmoid, sphenoid and maxillary sinuses consistent with chronic sinusitis. No findings to suggest acute hemorrhage, acute infarction or space-occupying mass lesion are noted. IMPRESSION: No acute intracranial abnormality noted. Changes consistent with chronic sinusitis. Electronically Signed   By: Alcide Clever M.D.   On: 02/29/2016 19:47   Mr Brain Wo Contrast  03/01/2016  CLINICAL DATA:  Acute presentation with speech disturbance yesterday, subsequently resolved. EXAM: MRI HEAD WITHOUT CONTRAST MRA HEAD WITHOUT CONTRAST TECHNIQUE: Multiplanar, multiecho pulse sequences of the brain and surrounding structures were obtained without intravenous contrast. Angiographic images of the head were obtained using MRA technique without contrast. COMPARISON:  Head CT 02/29/2016. FINDINGS: MRI HEAD FINDINGS Diffusion imaging does not show any acute or subacute infarction. The brainstem and cerebellum are normal. The cerebral hemispheres are normal without evidence of old small or large vessel infarction, mass lesion, hemorrhage, hydrocephalus or extra-axial collection. No pituitary mass. The patient does have mucosal inflammation throughout the paranasal sinuses. MRA HEAD FINDINGS Both internal carotid arteries are widely patent into the brain. The anterior and middle cerebral vessels are normal without proximal stenosis, aneurysm or vascular malformation. Both vertebral arteries are widely patent to the basilar. No basilar stenosis. Posterior circulation branch vessels are normal. IMPRESSION: Normal intracranial MR angiography of the large and medium size vessels. Normal MRI of the brain itself. Mucosal inflammatory changes of the paranasal sinuses. Electronically Signed   By: Paulina Fusi M.D.   On: 03/01/2016 10:02   US Carotid Bilateral  03/01/2016  CLINICAL DATA:  TIAs EXAM: BILATERAL CAROTID DUPLEX ULTRASOUND TECHNIQUE: Wallace Cullens scale imaging, color Doppler and duplex ultrasound  were performed of bilateral carotid and vertebral arteries in the neck. COMPARISON:  None. FINDINGS: Criteria: Quantification of carotid stenosis is based on velocity parameters that correlate the residual internal carotid diameter with NASCET-based stenosis levels, using the diameter of the distal internal carotid lumen as the denominator for stenosis measurement. The following velocity measurements were obtained: RIGHT ICA:  98/28 cm/sec CCA:  49/11 cm/sec SYSTOLIC ICA/CCA RATIO:  2.0 DIASTOLIC ICA/CCA RATIO:  2.6 ECA:  73 cm/sec LEFT ICA:  89/23 cm/sec CCA:  53/9 cm/sec SYSTOLIC ICA/CCA RATIO:  1.7 DIASTOLIC ICA/CCA RATIO:  3.2 ECA:  92 cm/sec RIGHT CAROTID ARTERY: The preliminary grayscale images demonstrate mild plaque formation in the carotid bulb. The waveforms, velocities and flow velocity ratios however demonstrate no evidence of focal hemodynamically significant stenosis. RIGHT VERTEBRAL ARTERY:  Antegrade in nature. LEFT CAROTID ARTERY: Mild plaque is noted in the carotid bulb and proximal internal carotid artery. The waveforms, velocities and flow velocity ratios demonstrate no evidence focal hemodynamically significant stenosis. LEFT VERTEBRAL ARTERY:  Antegrade in nature. IMPRESSION: No evidence of focal hemodynamically significant stenosis. Mild plaque is noted bilaterally. Electronically Signed   By: Alcide Clever M.D.   On: 03/01/2016 10:26   Mr Maxine Glenn Head/brain Wo Cm  03/01/2016  CLINICAL DATA:  Acute presentation with speech disturbance yesterday, subsequently resolved. EXAM: MRI HEAD WITHOUT CONTRAST MRA HEAD WITHOUT CONTRAST TECHNIQUE: Multiplanar, multiecho pulse sequences of the brain and surrounding structures were obtained without intravenous contrast. Angiographic images of the head were obtained using MRA technique without contrast. COMPARISON:  Head CT 02/29/2016. FINDINGS: MRI HEAD FINDINGS Diffusion imaging does not show any acute or subacute infarction. The brainstem and cerebellum are  normal. The cerebral hemispheres are normal without evidence of old small or large vessel infarction, mass lesion, hemorrhage, hydrocephalus or extra-axial collection. No  pituitary mass. The patient does have mucosal inflammation throughout the paranasal sinuses. MRA HEAD FINDINGS Both internal carotid arteries are widely patent into the brain. The anterior and middle cerebral vessels are normal without proximal stenosis, aneurysm or vascular malformation. Both vertebral arteries are widely patent to the basilar. No basilar stenosis. Posterior circulation branch vessels are normal. IMPRESSION: Normal intracranial MR angiography of the large and medium size vessels. Normal MRI of the brain itself. Mucosal inflammatory changes of the paranasal sinuses. Electronically Signed   By: Paulina Fusi M.D.   On: 03/01/2016 10:02    EKG:   Orders placed or performed during the hospital encounter of 02/29/16  . EKG 12-Lead  . EKG 12-Lead  . ED EKG  . ED EKG    ASSESSMENT AND PLAN:   TIA with dysarthria is probably from uncontrolled blood pressure Type blood pressure control is needed. Stroke workup CT head, MRI of the brain, carotid Dopplers and 2-D echocardiogram are normal LDL at 70 Patient passed bedside swallow evaluation. No PT needs identified by physical therapy. Continue aspirin and Lipitor  Dehydration and mild hyponatremia. Resolved with IV fluids. Sodium is normal  Malignant Hypertension. Continue hypertension Cozaar medication Metoprolol 25 MG by mouth twice a day is added to the regimen and IV hydralazine when necessary.  Diabetes 2. Start sliding scale, hold by mouth medications.  COPD. Stable. DuoNeb when necessary.     All the records are reviewed and case discussed with Care Management/Social Workerr. Management plans discussed with the patient, family and they are in agreement.  CODE STATUS: Full code  TOTAL TIME TAKING CARE OF THIS PATIENT: 35 minutes.   POSSIBLE D/C IN  a.m. DAYS, DEPENDING ON CLINICAL CONDITION.   Ramonita Lab M.D on 03/01/2016 at 3:01 PM  Between 7am to 6pm - Pager - [phone] After 6pm go to [email or web address] - password [email or web address] [facility]  [place] [facility]  Office  [phone]  CC: Primary care physician; Elizabeth Sauer, MD

## 2016-03-01 NOTE — Progress Notes (Signed)
PT Cancellation Note  Patient Details Name: Judy Bolton MRN: [id number] DOB: [birth year]   Cancelled Treatment:    Reason Eval/Treat Not Completed: Patient at procedure or test/unavailable (Consult received and chart reviewed.  Patient currently off unit for diagnostic testing.  Will re-attempt at later time/date as available and medically appropriate.)   Vasco Chong H. Manson Passey, PT, DPT, NCS 03/01/2016, 9:25 AM [phone]

## 2016-03-01 NOTE — Progress Notes (Signed)
OT Cancellation Note  Patient Details Name: Judy Bolton MRN: [id number] DOB: 1946/10/25   Cancelled Treatment:    Reason Eval/Treat Not Completed: OT screened, no needs identified, will sign off received order for OT evaluation but pt is at baseline for ADLs with no OT needs, will sign off.  Thank you for the referral.  Susanne Borders, OTR/L ascom [phone] 03/01/2016, 4:00 PM

## 2016-03-01 NOTE — Progress Notes (Signed)
*  PRELIMINARY RESULTS* Echocardiogram 2D Echocardiogram has been performed.  Georgann Housekeeper Hege 03/01/2016, 10:34 AM

## 2016-03-01 NOTE — Progress Notes (Signed)
Speech Therapy Note: received order, reviewed chart and consulted NSG re: pt's status today. Met w/ pt and family member who both denied any trouble swallowing; pt had fed herself the lunch meal w/ no reported swallowing problems. Pt conversed at conversational level w/ no difficulty speaking; pt stated her speech was back to "normal". NSG agreed w/ above.  Due to pt returning to her baseline w/ no swallowing or cognitive-linguistic deficits noted, no further skilled ST services indicated at this time. NSG to reconsult ST if any change in status or questions. Pt agreed.

## 2016-03-01 NOTE — Evaluation (Signed)
Physical Therapy Evaluation Patient Details Name: Judy Bolton MRN: [id number] DOB: December 29, 1945 Today's Date: 03/01/2016   History of Present Illness  presented to ER and admitted under observation for slurred speech with concern for possible TIA.  All imaging (head CT, MRI) negative for acute change at this time.  Clinical Impression  Upon evaluation, patient alert and oriented; follows all commands and demonstrates good insight/safety awareness.  Bilat UE/LE strength and ROM grossly WFL and symmetrical; no focal weakness or sensory deficit noted.  Speech fluent and without noted slurring/dysarthria.  Patient subjectively reporting full resolution of all initial symptoms at this time.  Able to complete bed mobility, sit/stand and basic transfers without assist device, close sup/mod indep; gait (240) without assist device, cga/close sup.  Fair cadence and gait speed  (10' walk time 5 seconds; gait speed 2.59ft/sec) without LOB or safety concern.   Patient appears to be at baseline level of functional ability (patient in agreement); no skilled PT needs identified.  Will complete initial order at this time; please re-consult should needs change.     Follow Up Recommendations No PT follow up    Equipment Recommendations       Recommendations for Other Services       Precautions / Restrictions Precautions Precautions: Fall Restrictions Weight Bearing Restrictions: No      Mobility  Bed Mobility Overal bed mobility: Independent                Transfers Overall transfer level: Modified independent Equipment used: None             General transfer comment: minimal use of bilat UEs; able to complete to/from various surfaces (edge of bed, standard toilet, recliner) without change in performance or level of assist required  Ambulation/Gait Ambulation/Gait assistance: Supervision;Modified independent (Device/Increase time) Ambulation Distance (Feet): 240 Feet Assistive  device: None   Gait velocity: 10' walk time, 5 seconds (2 ft/second)   General Gait Details: reciprocal stepping pattern with fair cadence and gait speed; no overt buckling or LOB.  Able to complete simple, dynamic gait components without LOB or safety concern.  Stairs            Wheelchair Mobility    Modified Rankin (Stroke Patients Only)       Balance Overall balance assessment: Needs assistance Sitting-balance support: No upper extremity supported;Feet supported Sitting balance-Leahy Scale: Good     Standing balance support: No upper extremity supported Standing balance-Leahy Scale: Good   Single Leg Stance - Right Leg: 5 Single Leg Stance - Left Leg: 0 (unable/declined to attempt due to chronic L-low back pain)           High Level Balance Comments: Standing functional reach >6-8" from immediate BOS, able to retrieve item from floor without difficulty             Pertinent Vitals/Pain Pain Assessment: No/denies pain    Home Living Family/patient expects to be discharged to:: Private residence Living Arrangements: Alone   Type of Home: Mobile home Home Access: Stairs to enter Entrance Stairs-Rails: Right Entrance Stairs-Number of Steps: 4 Home Layout: One level Home Equipment: None      Prior Function Level of Independence: Independent         Comments: Indep with ADLs, household activities and community needs     Hand Dominance        Extremity/Trunk Assessment   Upper Extremity Assessment: Overall WFL for tasks assessed  Lower Extremity Assessment: Overall WFL for tasks assessed (grossly 4+ to 5/5 throughout all extremities without focal weakness or sensory deficit noted.  )         Communication   Communication: No difficulties  Cognition Arousal/Alertness: Awake/alert Behavior During Therapy: WFL for tasks assessed/performed Overall Cognitive Status: Within Functional Limits for tasks assessed                       General Comments      Exercises        Assessment/Plan    PT Assessment Patent does not need any further PT services  PT Diagnosis     PT Problem List    PT Treatment Interventions     PT Goals (Current goals can be found in the Care Plan section) Acute Rehab PT Goals Patient Stated Goal: to return home PT Goal Formulation: All assessment and education complete, DC therapy    Frequency     Barriers to discharge        Co-evaluation               End of Session Equipment Utilized During Treatment: Gait belt Activity Tolerance: Patient tolerated treatment well Patient left: in chair;with call bell/phone within reach;with chair alarm set;with family/visitor present Nurse Communication: Mobility status    Functional Assessment Tool Used: clinical judgement, 10' walk time Functional Limitation: Mobility: Walking and moving around Mobility: Walking and Moving Around Current Status (Z1696): At least 1 percent but less than 20 percent impaired, limited or restricted Mobility: Walking and Moving Around Goal Status [phone]): At least 1 percent but less than 20 percent impaired, limited or restricted    Time: 1045-1104 PT Time Calculation (min) (ACUTE ONLY): 19 min   Charges:   PT Evaluation $PT Eval Low Complexity: 1 Procedure     PT G Codes:   PT G-Codes **NOT FOR INPATIENT CLASS** Functional Assessment Tool Used: clinical judgement, 10' walk time Functional Limitation: Mobility: Walking and moving around Mobility: Walking and Moving Around Current Status (B0175): At least 1 percent but less than 20 percent impaired, limited or restricted Mobility: Walking and Moving Around Goal Status [phone]): At least 1 percent but less than 20 percent impaired, limited or restricted    Leena Tiede H. Manson Passey, PT, DPT, NCS 03/01/2016, 12:21 PM [phone]

## 2016-03-01 NOTE — Progress Notes (Signed)
OT Cancellation Note  Patient Details Name: Judy Bolton MRN: [id number] DOB: November 27, 1946   Cancelled Treatment:    Reason Eval/Treat Not Completed: Patient at procedure or test/ unavailable during earlier this a.m.  Olegario Messier 03/01/2016, 12:03 PM

## 2016-03-02 DIAGNOSIS — I10 Essential (primary) hypertension: Secondary | ICD-10-CM | POA: Diagnosis not present

## 2016-03-02 DIAGNOSIS — E119 Type 2 diabetes mellitus without complications: Secondary | ICD-10-CM | POA: Diagnosis not present

## 2016-03-02 DIAGNOSIS — E860 Dehydration: Secondary | ICD-10-CM | POA: Diagnosis not present

## 2016-03-02 DIAGNOSIS — G459 Transient cerebral ischemic attack, unspecified: Secondary | ICD-10-CM | POA: Diagnosis not present

## 2016-03-02 LAB — GLUCOSE, CAPILLARY
GLUCOSE-CAPILLARY: 90 mg/dL (ref 65–99)
GLUCOSE-CAPILLARY: 86 mg/dL (ref 65–99)

## 2016-03-02 MED ORDER — LOSARTAN POTASSIUM 25 MG PO TABS: 75.0000 mg | ORAL_TABLET | Freq: Every day | ORAL | Status: DC

## 2016-03-02 MED ORDER — LABETALOL HCL 5 MG/ML IV SOLN
10.0000 mg | Freq: Once | INTRAVENOUS | Status: DC
Start: 2016-03-02 — End: 2016-03-02
  Filled 2016-03-02: qty 4

## 2016-03-02 MED ORDER — ALPRAZOLAM 0.25 MG PO TABS
0.2500 mg | ORAL_TABLET | Freq: Once | ORAL | Status: AC
  Administered 2016-03-02: 12:00:00 0.25 mg via ORAL

## 2016-03-02 MED ORDER — LOSARTAN POTASSIUM 50 MG PO TABS: 75.0000 mg | ORAL_TABLET | Freq: Every day | ORAL | Status: DC

## 2016-03-02 MED ORDER — METOPROLOL TARTRATE 25 MG PO TABS: 12.5000 mg | ORAL_TABLET | Freq: Two times a day (BID) | ORAL | Status: DC

## 2016-03-02 MED ORDER — ASPIRIN EC 325 MG PO TBEC: 325.0000 mg | DELAYED_RELEASE_TABLET | Freq: Every day | ORAL | Status: DC

## 2016-03-02 NOTE — Discharge Instructions (Signed)
Activity as tolerated Diet diabetic and cardiac Follow-up with primary care physician in a week

## 2016-03-02 NOTE — Progress Notes (Signed)
Discharge instructions given and went over with patient at bedside. All questions answered. Prescriptions given. Patient discharged home with family. Bo Mcclintock, RN

## 2016-03-02 NOTE — Progress Notes (Signed)
Dr. Sheryle Hail notified BP 198/75. Initially Labetalol ordered but Dr. Sheryle Hail alerted HR remaining in the 60's on telemetry and d/c'd labetalol. Telephone order to give daily dose of Losartan 50mg  now. Will continue to monitor.

## 2016-03-02 NOTE — Discharge Summary (Signed)
[facility] - [place] at [facility]   PATIENT NAME: Judy Bolton    MR#:  [id number]  DATE OF BIRTH:  1946-10-19  DATE OF ADMISSION:  02/29/2016 ADMITTING PHYSICIAN: Shaune Pollack, MD  DATE OF DISCHARGE: 03/02/16 PRIMARY CARE PHYSICIAN: Elizabeth Sauer, MD    ADMISSION DIAGNOSIS:  Aphasia [R47.01] Transient cerebral ischemia, unspecified transient cerebral ischemia type [G45.9]  DISCHARGE DIAGNOSIS:  Principal Problem:   TIA (transient ischemic attack)  Malignant hypertension  SECONDARY DIAGNOSIS:   Past Medical History  Diagnosis Date  . Hypertension   . Diabetes mellitus without complication (HCC)   . Hyperlipidemia   . Back pain   . Asthma   . Acid reflux   . Back pain   . Arthritis   . COPD (chronic obstructive pulmonary disease) (HCC)     HOSPITAL COURSE:   TIA with dysarthria is probably from uncontrolled blood pressure Tight blood pressure control is needed. Stroke workup CT head, MRI of the brain, carotid Dopplers and 2-D echocardiogram are normal LDL at 70 Patient passed bedside swallow evaluation. No PT needs identified by physical therapy. Continue aspirin 325  and Lipitor  Dehydration and mild hyponatremia. Resolved with IV fluids. Sodium is normal  Malignant Hypertension.  Continue home medication hydrochlorothiazide 25 mg by mouth once daily Cozaar dose is increased from 50 to 75 mg once daily Metoprolol 12.5 mg by mouth twice a day  Diabetes 2. Resume metformin home medication    DISCHARGE CONDITIONS:   Fair  CONSULTS OBTAINED:      PROCEDURES none  DRUG ALLERGIES:   Allergies  Allergen Reactions  . Codeine Itching  . Iodine Swelling  . Latex     Other reaction(s): Other (See Comments)  . Penicillins Other (See Comments)    Paralysis Has patient had a PCN reaction causing immediate rash, facial/tongue/throat swelling, SOB or lightheadedness with hypotension: No Has patient had a PCN reaction causing severe  rash involving mucus membranes or skin necrosis: No Has patient had a PCN reaction that required hospitalization Yes Has patient had a PCN reaction occurring within the last 10 years: No If all of the above answers are "NO", then may proceed with Cephalosporin use.    DISCHARGE MEDICATIONS:   Current Discharge Medication List    START taking these medications   Details  metoprolol tartrate (LOPRESSOR) 25 MG tablet Take 0.5 tablets (12.5 mg total) by mouth 2 (two) times daily. Qty: 60 tablet, Refills: 0      CONTINUE these medications which have CHANGED   Details  aspirin EC 325 MG tablet Take 1 tablet (325 mg total) by mouth daily. Qty: 30 tablet, Refills: 0    losartan (COZAAR) 25 MG tablet Take 3 tablets (75 mg total) by mouth daily. Qty: 90 tablet, Refills: 0      CONTINUE these medications which have NOT CHANGED   Details  albuterol (PROVENTIL HFA;VENTOLIN HFA) 108 (90 Base) MCG/ACT inhaler Inhale 2 puffs into the lungs every 6 (six) hours as needed for wheezing or shortness of breath.    albuterol (PROVENTIL) (2.5 MG/3ML) 0.083% nebulizer solution Take 3 mLs (2.5 mg total) by nebulization every 6 (six) hours as needed for wheezing or shortness of breath. Qty: 75 mL, Refills: 12    ALPRAZolam (XANAX) 0.25 MG tablet Take 1 tablet (0.25 mg total) by mouth 2 (two) times daily as needed for anxiety. Qty: 30 tablet, Refills: 0   Associated Diagnoses: Acute anxiety    atorvastatin (LIPITOR) 40 MG tablet  Take 1 tablet (40 mg total) by mouth daily. Qty: 30 tablet, Refills: 5   Associated Diagnoses: Hyperlipidemia    azelastine (ASTELIN) 0.1 % nasal spray Place 2 sprays into both nostrils every morning. Use in each nostril as directed    budesonide-formoterol (SYMBICORT) 160-4.5 MCG/ACT inhaler Inhale 2 puffs into the lungs 2 (two) times daily. Qty: 1 Inhaler, Refills: 3    cetirizine (ZYRTEC) 10 MG tablet Take 10 mg by mouth daily. Refills: 1    etodolac (LODINE) 500 MG  tablet TAKE ONE TABLET BY MOUTH ONCE DAILY Qty: 90 tablet, Refills: 0    fluticasone (FLONASE) 50 MCG/ACT nasal spray Place 2 sprays into the nose at bedtime.     gemfibrozil (LOPID) 600 MG tablet Take 1 tablet (600 mg total) by mouth 2 (two) times daily. Qty: 180 tablet, Refills: 5    hydrochlorothiazide (HYDRODIURIL) 25 MG tablet Take 1 tablet (25 mg total) by mouth daily. Qty: 30 tablet, Refills: 5   Associated Diagnoses: Essential hypertension    metFORMIN (GLUCOPHAGE) 1000 MG tablet Take 1,000 mg by mouth 2 (two) times daily with a meal.    potassium chloride SA (K-DUR,KLOR-CON) 20 MEQ tablet Take 20 mEq by mouth 2 (two) times daily.    predniSONE (DELTASONE) 1 MG tablet Take 5 mg by mouth daily with breakfast.    ranitidine (ZANTAC) 150 MG tablet Take 150 mg by mouth daily.    tiotropium (SPIRIVA) 18 MCG inhalation capsule Place 18 mcg into inhaler and inhale daily.    VICTOZA 18 MG/3ML SOPN Inject 1.8 mg into the skin daily as needed (for blood sugar.).  Refills: 0    vitamin B-12 (CYANOCOBALAMIN) 1000 MCG tablet Take 2,000 mcg by mouth at bedtime.     levofloxacin (LEVAQUIN) 500 MG tablet Take 1 tablet (500 mg total) by mouth daily. Qty: 14 tablet, Refills: 0   Associated Diagnoses: Bronchitis; Chronic obstructive pulmonary disease with acute exacerbation (HCC)      STOP taking these medications     potassium chloride (KLOR-CON) 20 MEQ packet          DISCHARGE INSTRUCTIONS:    diet cardiac and diabetic  Follow-up with primary care physician in a week  DIET:  Cardiac diet, diabetic   DISCHARGE CONDITION:  Fair  ACTIVITY:  Activity as tolerated  OXYGEN:  Home Oxygen: No.   Oxygen Delivery: room air  DISCHARGE LOCATION:  home   If you experience worsening of your admission symptoms, develop shortness of breath, life threatening emergency, suicidal or homicidal thoughts you must seek medical attention immediately by calling 911 or calling your MD  immediately  if symptoms less severe.  You Must read complete instructions/literature along with all the possible adverse reactions/side effects for all the Medicines you take and that have been prescribed to you. Take any new Medicines after you have completely understood and accpet all the possible adverse reactions/side effects.   Please note  You were cared for by a hospitalist during your hospital stay. If you have any questions about your discharge medications or the care you received while you were in the hospital after you are discharged, you can call the unit and asked to speak with the hospitalist on call if the hospitalist that took care of you is not available. Once you are discharged, your primary care physician will handle any further medical issues. Please note that NO REFILLS for any discharge medications will be authorized once you are discharged, as it is imperative that you  return to your primary care physician (or establish a relationship with a primary care physician if you do not have one) for your aftercare needs so that they can reassess your need for medications and monitor your lab values.     Today  Chief Complaint  Patient presents with  . Code Stroke    patient is doing fine. Denies any complaints. Denies any chest pain or shortness of breath. Dysarthria is resolved   ROS: CONSTITUTIONAL: Denies fevers, chills. Denies any fatigue, weakness.  EYES: Denies blurry vision, double vision, eye pain. EARS, NOSE, THROAT: Denies tinnitus, ear pain, hearing loss. RESPIRATORY: Denies cough, wheeze, shortness of breath.  CARDIOVASCULAR: Denies chest pain, palpitations, edema.  GASTROINTESTINAL: Denies nausea, vomiting, diarrhea, abdominal pain. Denies bright red blood per rectum. GENITOURINARY: Denies dysuria, hematuria. ENDOCRINE: Denies nocturia or thyroid problems. HEMATOLOGIC AND LYMPHATIC: Denies easy bruising or bleeding. SKIN: Denies rash or  lesion. MUSCULOSKELETAL: Denies pain in neck, back, shoulder, knees, hips or arthritic symptoms.  NEUROLOGIC: Denies paralysis, paresthesias.  PSYCHIATRIC: Denies anxiety or depressive symptoms.   VITAL SIGNS:  Blood pressure 185/83, pulse 56, temperature 97.6 F (36.4 C), temperature source Oral, resp. rate 18, height 5\' 1"  (1.549 m), weight 59.784 kg (131 lb 12.8 oz), SpO2 97 %.  I/O:    Intake/Output Summary (Last 24 hours) at 03/02/16 1213 Last data filed at 03/02/16 0926  Gross per 24 hour  Intake    480 ml  Output      0 ml  Net    480 ml    PHYSICAL EXAMINATION:  GENERAL:  70 y.o.-year-old patient lying in the bed with no acute distress.  EYES: Pupils equal, round, reactive to light and accommodation. No scleral icterus. Extraocular muscles intact.  HEENT: Head atraumatic, normocephalic. Oropharynx and nasopharynx clear.  NECK:  Supple, no jugular venous distention. No thyroid enlargement, no tenderness.  LUNGS: Normal breath sounds bilaterally, no wheezing, rales,rhonchi or crepitation. No use of accessory muscles of respiration.  CARDIOVASCULAR: S1, S2 normal. No murmurs, rubs, or gallops.  ABDOMEN: Soft, non-tender, non-distended. Bowel sounds present. No organomegaly or mass.  EXTREMITIES: No pedal edema, cyanosis, or clubbing.  NEUROLOGIC: Cranial nerves II through XII are intact. Muscle strength 5/5 in all extremities. Sensation intact. Gait not checked.  PSYCHIATRIC: The patient is alert and oriented x 3.  SKIN: No obvious rash, lesion, or ulcer.   DATA REVIEW:   CBC  Recent Labs Lab 02/29/16 1912  WBC 10.1  HGB 11.9*  HCT 35.6  PLT 273    Chemistries   Recent Labs Lab 02/29/16 1912 03/01/16 0458  NA 134* 139  K 4.6 4.1  CL 104 111  CO2 23 22  GLUCOSE 116* 84  BUN 29* 25*  CREATININE 1.11* 0.92  CALCIUM 9.2 9.0  AST 22  --   ALT 13*  --   ALKPHOS 77  --   BILITOT 0.3  --     Cardiac Enzymes  Recent Labs Lab 02/29/16 1912  TROPONINI  <0.03    Microbiology Results  Results for orders placed or performed in visit on 03/11/12  Clostridium Difficile by PCR     Status: None   Collection Time: 03/11/12  2:30 PM  Result Value Ref Range Status   Micro Text Report   Final       COMMENT                   NEGATIVE-CLOS.DIFFICILE TOXIN NOT DETECTED BY PCR   ANTIBIOTIC  Stool culture     Status: None   Collection Time: 03/11/12  2:30 PM  Result Value Ref Range Status   Micro Text Report   Final       COMMENT                   NO SALMONELLA OR SHIGELLA ISOLATED IN 48 HOURS   COMMENT                   NO CAMPYLOBACTER ANTIGEN DETECTED   COMMENT                   NO PATHOGENIC E.COLI DETECTED   ANTIBIOTIC                                                        RADIOLOGY:  Ct Head Wo Contrast  02/29/2016  CLINICAL DATA:  Speech difficulties EXAM: CT HEAD WITHOUT CONTRAST TECHNIQUE: Contiguous axial images were obtained from the base of the skull through the vertex without intravenous contrast. COMPARISON:  None. FINDINGS: The bony calvarium is intact. No gross soft tissue abnormality is noted. Considerable soft tissue changes are noted within the ethmoid, sphenoid and maxillary sinuses consistent with chronic sinusitis. No findings to suggest acute hemorrhage, acute infarction or space-occupying mass lesion are noted. IMPRESSION: No acute intracranial abnormality noted. Changes consistent with chronic sinusitis. Electronically Signed   By: Alcide Clever M.D.   On: 02/29/2016 19:47   Mr Brain Wo Contrast  03/01/2016  CLINICAL DATA:  Acute presentation with speech disturbance yesterday, subsequently resolved. EXAM: MRI HEAD WITHOUT CONTRAST MRA HEAD WITHOUT CONTRAST TECHNIQUE: Multiplanar, multiecho pulse sequences of the brain and surrounding structures were obtained without intravenous contrast. Angiographic images of the head were obtained using MRA technique without contrast.  COMPARISON:  Head CT 02/29/2016. FINDINGS: MRI HEAD FINDINGS Diffusion imaging does not show any acute or subacute infarction. The brainstem and cerebellum are normal. The cerebral hemispheres are normal without evidence of old small or large vessel infarction, mass lesion, hemorrhage, hydrocephalus or extra-axial collection. No pituitary mass. The patient does have mucosal inflammation throughout the paranasal sinuses. MRA HEAD FINDINGS Both internal carotid arteries are widely patent into the brain. The anterior and middle cerebral vessels are normal without proximal stenosis, aneurysm or vascular malformation. Both vertebral arteries are widely patent to the basilar. No basilar stenosis. Posterior circulation branch vessels are normal. IMPRESSION: Normal intracranial MR angiography of the large and medium size vessels. Normal MRI of the brain itself. Mucosal inflammatory changes of the paranasal sinuses. Electronically Signed   By: Paulina Fusi M.D.   On: 03/01/2016 10:02   US Carotid Bilateral  03/01/2016  CLINICAL DATA:  TIAs EXAM: BILATERAL CAROTID DUPLEX ULTRASOUND TECHNIQUE: Wallace Cullens scale imaging, color Doppler and duplex ultrasound were performed of bilateral carotid and vertebral arteries in the neck. COMPARISON:  None. FINDINGS: Criteria: Quantification of carotid stenosis is based on velocity parameters that correlate the residual internal carotid diameter with NASCET-based stenosis levels, using the diameter of the distal internal carotid lumen as the denominator for stenosis measurement. The following velocity measurements were obtained: RIGHT ICA:  98/28 cm/sec CCA:  49/11 cm/sec SYSTOLIC ICA/CCA RATIO:  2.0 DIASTOLIC ICA/CCA RATIO:  2.6 ECA:  73 cm/sec LEFT ICA:  89/23 cm/sec CCA:  53/9 cm/sec SYSTOLIC ICA/CCA RATIO:  1.7 DIASTOLIC ICA/CCA RATIO:  3.2 ECA:  92 cm/sec RIGHT CAROTID ARTERY: The preliminary grayscale images demonstrate mild plaque formation in the carotid bulb. The waveforms, velocities  and flow velocity ratios however demonstrate no evidence of focal hemodynamically significant stenosis. RIGHT VERTEBRAL ARTERY:  Antegrade in nature. LEFT CAROTID ARTERY: Mild plaque is noted in the carotid bulb and proximal internal carotid artery. The waveforms, velocities and flow velocity ratios demonstrate no evidence focal hemodynamically significant stenosis. LEFT VERTEBRAL ARTERY:  Antegrade in nature. IMPRESSION: No evidence of focal hemodynamically significant stenosis. Mild plaque is noted bilaterally. Electronically Signed   By: Alcide Clever M.D.   On: 03/01/2016 10:26   Mr Maxine Glenn Head/brain Wo Cm  03/01/2016  CLINICAL DATA:  Acute presentation with speech disturbance yesterday, subsequently resolved. EXAM: MRI HEAD WITHOUT CONTRAST MRA HEAD WITHOUT CONTRAST TECHNIQUE: Multiplanar, multiecho pulse sequences of the brain and surrounding structures were obtained without intravenous contrast. Angiographic images of the head were obtained using MRA technique without contrast. COMPARISON:  Head CT 02/29/2016. FINDINGS: MRI HEAD FINDINGS Diffusion imaging does not show any acute or subacute infarction. The brainstem and cerebellum are normal. The cerebral hemispheres are normal without evidence of old small or large vessel infarction, mass lesion, hemorrhage, hydrocephalus or extra-axial collection. No pituitary mass. The patient does have mucosal inflammation throughout the paranasal sinuses. MRA HEAD FINDINGS Both internal carotid arteries are widely patent into the brain. The anterior and middle cerebral vessels are normal without proximal stenosis, aneurysm or vascular malformation. Both vertebral arteries are widely patent to the basilar. No basilar stenosis. Posterior circulation branch vessels are normal. IMPRESSION: Normal intracranial MR angiography of the large and medium size vessels. Normal MRI of the brain itself. Mucosal inflammatory changes of the paranasal sinuses. Electronically Signed   By:  Paulina Fusi M.D.   On: 03/01/2016 10:02    EKG:   Orders placed or performed during the hospital encounter of 02/29/16  . EKG 12-Lead  . EKG 12-Lead  . ED EKG  . ED EKG      Management plans discussed with the patient, family and they are in agreement.  CODE STATUS:     Code Status Orders        Start     Ordered   02/29/16 2318  Full code   Continuous     02/29/16 2317    Code Status History    Date Active Date Inactive Code Status Order ID Comments User Context   This patient has a current code status but no historical code status.    Advance Directive Documentation        Most Recent Value   Type of Advance Directive  Living will, Healthcare Power of Attorney   Pre-existing out of facility DNR order (yellow form or pink MOST form)     "MOST" Form in Place?        TOTAL TIME TAKING CARE OF THIS PATIENT: 45 minutes.    @[facility] @  on 03/02/2016 at 12:13 PM  Between 7am to 6pm - Pager - [phone]  After 6pm go to [email or web address] - password [email or web address] [facility]  [facility]  Office  [phone]  CC: Primary care physician; Elizabeth Sauer, MD

## 2016-03-05 ENCOUNTER — Encounter: Payer: Self-pay | Admitting: Family Medicine

## 2016-03-05 ENCOUNTER — Ambulatory Visit (INDEPENDENT_AMBULATORY_CARE_PROVIDER_SITE_OTHER): Payer: Medicare Other | Admitting: Family Medicine

## 2016-03-05 VITALS — BP 122/90 | HR 78 | Ht 61.0 in | Wt 132.0 lb

## 2016-03-05 DIAGNOSIS — Z09 Encounter for follow-up examination after completed treatment for conditions other than malignant neoplasm: Secondary | ICD-10-CM

## 2016-03-05 DIAGNOSIS — I679 Cerebrovascular disease, unspecified: Secondary | ICD-10-CM | POA: Diagnosis not present

## 2016-03-05 DIAGNOSIS — I10 Essential (primary) hypertension: Secondary | ICD-10-CM | POA: Diagnosis not present

## 2016-03-05 MED ORDER — LOSARTAN POTASSIUM 50 MG PO TABS: 50.0000 mg | ORAL_TABLET | Freq: Two times a day (BID) | ORAL | Status: DC

## 2016-03-05 NOTE — Patient Instructions (Signed)
How to Take Your Blood Pressure °HOW DO I GET A BLOOD PRESSURE MACHINE? °· You can buy an electronic home blood pressure machine at your local pharmacy. Insurance will sometimes cover the cost if you have a prescription. °· Ask your doctor what type of machine is best for you. There are different machines for your arm and your wrist. °· If you decide to buy a machine to check your blood pressure on your arm, first check the size of your arm so you can buy the right size cuff. To check the size of your arm:   °¨ Use a measuring tape that shows both inches and centimeters.   °¨ Wrap the measuring tape around the upper-middle part of your arm. You may need someone to help you measure.   °¨ Write down your arm measurement in both inches and centimeters.   °· To measure your blood pressure correctly, it is important to have the right size cuff.   °¨ If your arm is up to 13 inches (up to 34 centimeters), get an adult cuff size. °¨ If your arm is 13 to 17 inches (35 to 44 centimeters), get a large adult cuff size.   °¨  If your arm is 17 to 20 inches (45 to 52 centimeters), get an adult thigh cuff.   °WHAT DO THE NUMBERS MEAN?  °· There are two numbers that make up your blood pressure. For example: 120/80. °¨ The first number (120 in our example) is called the "systolic pressure." It is a measure of the pressure in your blood vessels when your heart is pumping blood. °¨ The second number (80 in our example) is called the "diastolic pressure." It is a measure of the pressure in your blood vessels when your heart is resting between beats. °· Your doctor will tell you what your blood pressure should be. °WHAT SHOULD I DO BEFORE I CHECK MY BLOOD PRESSURE?  °· Try to rest or relax for at least 30 minutes before you check your blood pressure. °· Do not smoke. °· Do not have any drinks with caffeine, such as: °¨ Soda. °¨ Coffee. °¨ Tea. °· Check your blood pressure in a quiet room. °· Sit down and stretch out your arm on a table.  Keep your arm at about the level of your heart. Let your arm relax. °· Make sure that your legs are not crossed. °HOW DO I CHECK MY BLOOD PRESSURE? °· Follow the directions that came with your machine. °· Make sure you remove any tight-fitting clothing from your arm or wrist. Wrap the cuff around your upper arm or wrist. You should be able to fit a finger between the cuff and your arm. If you cannot fit a finger between the cuff and your arm, it is too tight and should be removed and rewrapped. °· Some units require you to manually pump up the arm cuff. °· Automatic units inflate the cuff when you press a button. °· Cuff deflation is automatic in both models. °· After the cuff is inflated, the unit measures your blood pressure and pulse. The readings are shown on a monitor. Hold still and breathe normally while the cuff is inflated. °· Getting a reading takes less than a minute. °· Some models store readings in a memory. Some provide a printout of readings. If your machine does not store your readings, keep a written record. °· Take readings with you to your next visit with your doctor. °  °This information is not intended to replace advice given to   you by your health care provider. Make sure you discuss any questions you have with your health care provider. °  °Document Released: 11/04/2008 Document Revised: 12/13/2014 Document Reviewed: 01/17/2014 °Elsevier Interactive Patient Education ©2016 Elsevier Inc. ° °

## 2016-03-05 NOTE — Progress Notes (Signed)
Name: Judy Bolton   MRN: [id number]    DOB: 1946/04/26   Date:03/05/2016       Progress Note  Subjective  Chief Complaint  Chief Complaint  Patient presents with  . Follow-up    discharged from hospital 03/02/2016- Dx TIA    HPI Comments: Patient presents for followup of hospital discharge.  Neurologic Problem The patient's primary symptoms include slurred speech. The patient's pertinent negatives include no altered mental status, clumsiness, focal sensory loss, focal weakness, loss of balance, memory loss, near-syncope, syncope, visual change or weakness. Primary symptoms comment: s/p tia/cva. This is a recurrent problem. The neurological problem developed gradually. The problem has been gradually improving since onset. There was no focality noted. Associated symptoms include dizziness, fatigue, headaches, light-headedness and nausea. Pertinent negatives include no abdominal pain, auditory change, aura, back pain, bladder incontinence, bowel incontinence, chest pain, confusion, diaphoresis, fever, neck pain, palpitations, shortness of breath, vertigo or vomiting. The treatment provided mild relief. Her past medical history is significant for a CVA. There is no history of a bleeding disorder, dementia, head trauma, liver disease, mood changes or seizures.    No problem-specific assessment & plan notes found for this encounter.   Past Medical History  Diagnosis Date  . Hypertension   . Diabetes mellitus without complication (HCC)   . Hyperlipidemia   . Back pain   . Asthma   . Acid reflux   . Back pain   . Arthritis   . COPD (chronic obstructive pulmonary disease) [facility])     Past Surgical History  Procedure Laterality Date  . Abdominal hysterectomy    . Rotator cuff repair Right   . Appendectomy      Family History  Problem Relation Age of Onset  . Heart attack Mother   . Cancer Father     Social History   Social History  . Marital Status: Widowed    Spouse Name:  N/A  . Number of Children: N/A  . Years of Education: N/A   Occupational History  . Not on file.   Social History Main Topics  . Smoking status: Former Smoker    Quit date: 08/14/2000  . Smokeless tobacco: Not on file  . Alcohol Use: Yes  . Drug Use: No  . Sexual Activity: Not Currently   Other Topics Concern  . Not on file   Social History Narrative    Allergies  Allergen Reactions  . Codeine Itching  . Iodine Swelling  . Latex     Other reaction(s): Other (See Comments)  . Penicillins Other (See Comments)    Paralysis Has patient had a PCN reaction causing immediate rash, facial/tongue/throat swelling, SOB or lightheadedness with hypotension: No Has patient had a PCN reaction causing severe rash involving mucus membranes or skin necrosis: No Has patient had a PCN reaction that required hospitalization Yes Has patient had a PCN reaction occurring within the last 10 years: No If all of the above answers are "NO", then may proceed with Cephalosporin use.     Review of Systems  Constitutional: Positive for fatigue. Negative for fever, chills, weight loss, malaise/fatigue and diaphoresis.  HENT: Negative for ear discharge, ear pain and sore throat.   Eyes: Negative for blurred vision.  Respiratory: Negative for cough, sputum production, shortness of breath and wheezing.   Cardiovascular: Negative for chest pain, palpitations, leg swelling and near-syncope.  Gastrointestinal: Positive for nausea. Negative for heartburn, vomiting, abdominal pain, diarrhea, constipation, blood in stool, melena and bowel incontinence.  Genitourinary: Negative for bladder incontinence, dysuria, urgency, frequency and hematuria.  Musculoskeletal: Negative for myalgias, back pain, joint pain and neck pain.  Skin: Negative for rash.  Neurological: Positive for dizziness, light-headedness and headaches. Negative for vertigo, tingling, sensory change, focal weakness, syncope, weakness and loss of  balance.  Endo/Heme/Allergies: Negative for environmental allergies and polydipsia. Does not bruise/bleed easily.  Psychiatric/Behavioral: Negative for depression, suicidal ideas, memory loss and confusion. The patient is not nervous/anxious and does not have insomnia.      Objective  Filed Vitals:   03/05/16 1114  BP: 122/90  Pulse: 78  Height: 5\' 1"  (1.549 m)  Weight: 132 lb (59.875 kg)    Physical Exam  Constitutional: She is well-developed, well-nourished, and in no distress. No distress.  HENT:  Head: Normocephalic and atraumatic.  Right Ear: External ear normal.  Left Ear: External ear normal.  Nose: Nose normal.  Mouth/Throat: Oropharynx is clear and moist.  Eyes: Conjunctivae and EOM are normal. Pupils are equal, round, and reactive to light. Right eye exhibits no discharge. Left eye exhibits no discharge.  Neck: Normal range of motion. Neck supple. No JVD present. No thyromegaly present.  Cardiovascular: Normal rate, regular rhythm, normal heart sounds and intact distal pulses.  Exam reveals no gallop and no friction rub.   No murmur heard. Pulmonary/Chest: Effort normal and breath sounds normal.  Abdominal: Soft. Bowel sounds are normal. She exhibits no mass. There is no tenderness. There is no guarding.  Musculoskeletal: Normal range of motion. She exhibits no edema.  Lymphadenopathy:    She has no cervical adenopathy.  Neurological: She is alert. She has normal reflexes.  Skin: Skin is warm and dry. She is not diaphoretic.  Psychiatric: Mood and affect normal.      Assessment & Plan  Problem List Items Addressed This Visit    None    Visit Diagnoses    Hospital discharge follow-up    -  Primary    Essential hypertension        Relevant Medications    losartan (COZAAR) 50 MG tablet    Cerebrovascular disease        Relevant Medications    losartan (COZAAR) 50 MG tablet         Dr. [facility]  03/05/2016

## 2016-03-26 DIAGNOSIS — I70213 Atherosclerosis of native arteries of extremities with intermittent claudication, bilateral legs: Secondary | ICD-10-CM | POA: Diagnosis not present

## 2016-03-26 DIAGNOSIS — I739 Peripheral vascular disease, unspecified: Secondary | ICD-10-CM | POA: Diagnosis not present

## 2016-03-26 DIAGNOSIS — I714 Abdominal aortic aneurysm, without rupture: Secondary | ICD-10-CM | POA: Diagnosis not present

## 2016-04-01 ENCOUNTER — Encounter: Payer: Self-pay | Admitting: Family Medicine

## 2016-04-01 ENCOUNTER — Ambulatory Visit (INDEPENDENT_AMBULATORY_CARE_PROVIDER_SITE_OTHER): Payer: Medicare Other | Admitting: Family Medicine

## 2016-04-01 VITALS — BP 120/82 | HR 60 | Ht 61.0 in | Wt 135.0 lb

## 2016-04-01 DIAGNOSIS — I10 Essential (primary) hypertension: Secondary | ICD-10-CM | POA: Diagnosis not present

## 2016-04-01 DIAGNOSIS — J45901 Unspecified asthma with (acute) exacerbation: Secondary | ICD-10-CM | POA: Diagnosis not present

## 2016-04-01 MED ORDER — LOSARTAN POTASSIUM 50 MG PO TABS: 50.0000 mg | ORAL_TABLET | Freq: Two times a day (BID) | ORAL | Status: DC

## 2016-04-01 NOTE — Progress Notes (Signed)
Name: Judy Bolton   MRN: [id number]    DOB: [birth year]   Date:04/01/2016       Progress Note  Subjective  Chief Complaint  Chief Complaint  Patient presents with  . Hypertension    changed Losartan at last visit    Hypertension This is a chronic problem. The current episode started more than 1 year ago. The problem has been gradually worsening since onset. The problem is controlled. Pertinent negatives include no anxiety, blurred vision, chest pain, headaches, malaise/fatigue, neck pain, orthopnea, palpitations, peripheral edema, PND, shortness of breath or sweats. There are no associated agents to hypertension. Risk factors for coronary artery disease include dyslipidemia and smoking/tobacco exposure. Past treatments include angiotensin blockers. The current treatment provides mild improvement. There are no compliance problems.  There is no history of angina, kidney disease, CAD/MI, CVA, heart failure, left ventricular hypertrophy, PVD, renovascular disease or retinopathy. There is no history of chronic renal disease or a hypertension causing med.    No problem-specific assessment & plan notes found for this encounter.   Past Medical History  Diagnosis Date  . Hypertension   . Diabetes mellitus without complication (HCC)   . Hyperlipidemia   . Back pain   . Asthma   . Acid reflux   . Back pain   . Arthritis   . COPD (chronic obstructive pulmonary disease) [facility])     Past Surgical History  Procedure Laterality Date  . Abdominal hysterectomy    . Rotator cuff repair Right   . Appendectomy      Family History  Problem Relation Age of Onset  . Heart attack Mother   . Cancer Father     Social History   Social History  . Marital Status: Widowed    Spouse Name: N/A  . Number of Children: N/A  . Years of Education: N/A   Occupational History  . Not on file.   Social History Main Topics  . Smoking status: Former Smoker    Quit date: 08/14/2000  . Smokeless  tobacco: Not on file  . Alcohol Use: Yes  . Drug Use: No  . Sexual Activity: Not Currently   Other Topics Concern  . Not on file   Social History Narrative    Allergies  Allergen Reactions  . Codeine Itching  . Iodine Swelling  . Latex     Other reaction(s): Other (See Comments)  . Penicillins Other (See Comments)    Paralysis Has patient had a PCN reaction causing immediate rash, facial/tongue/throat swelling, SOB or lightheadedness with hypotension: No Has patient had a PCN reaction causing severe rash involving mucus membranes or skin necrosis: No Has patient had a PCN reaction that required hospitalization Yes Has patient had a PCN reaction occurring within the last 10 years: No If all of the above answers are "NO", then may proceed with Cephalosporin use.     Review of Systems  Constitutional: Negative for fever, chills, weight loss and malaise/fatigue.  HENT: Negative for ear discharge, ear pain and sore throat.   Eyes: Negative for blurred vision.  Respiratory: Negative for cough, sputum production, shortness of breath and wheezing.   Cardiovascular: Negative for chest pain, palpitations, orthopnea, leg swelling and PND.  Gastrointestinal: Negative for heartburn, nausea, abdominal pain, diarrhea, constipation, blood in stool and melena.  Genitourinary: Negative for dysuria, urgency, frequency and hematuria.  Musculoskeletal: Negative for myalgias, back pain, joint pain and neck pain.  Skin: Negative for rash.  Neurological: Negative for dizziness, tingling, sensory  change, focal weakness and headaches.  Endo/Heme/Allergies: Negative for environmental allergies and polydipsia. Does not bruise/bleed easily.  Psychiatric/Behavioral: Negative for depression and suicidal ideas. The patient is not nervous/anxious and does not have insomnia.      Objective  Filed Vitals:   04/01/16 1049  BP: 120/82  Pulse: 60  Height: 5\' 1"  (1.549 m)  Weight: 135 lb (61.236 kg)     Physical Exam  Constitutional: She is well-developed, well-nourished, and in no distress. No distress.  HENT:  Head: Normocephalic and atraumatic.  Right Ear: External ear normal.  Left Ear: External ear normal.  Nose: Nose normal.  Mouth/Throat: Oropharynx is clear and moist.  Eyes: Conjunctivae and EOM are normal. Pupils are equal, round, and reactive to light. Right eye exhibits no discharge. Left eye exhibits no discharge.  Neck: Normal range of motion. Neck supple. No JVD present. No thyromegaly present.  Cardiovascular: Normal rate, regular rhythm, normal heart sounds and intact distal pulses.  Exam reveals no gallop and no friction rub.   No murmur heard. Pulmonary/Chest: Effort normal and breath sounds normal.  Abdominal: Soft. Bowel sounds are normal. She exhibits no mass. There is no tenderness. There is no guarding.  Musculoskeletal: Normal range of motion. She exhibits no edema.  Lymphadenopathy:    She has no cervical adenopathy.  Neurological: She is alert. She has normal reflexes.  Skin: Skin is warm and dry. She is not diaphoretic.  Psychiatric: Mood and affect normal.  Nursing note and vitals reviewed.     Assessment & Plan  Problem List Items Addressed This Visit    None    Visit Diagnoses    Essential hypertension    -  Primary    Relevant Medications    losartan (COZAAR) 50 MG tablet      Gave sample of Respimat   Dr. [facility]  04/01/2016

## 2016-04-01 NOTE — Patient Instructions (Signed)
Aphasia Aphasia is damage to the part of your brain that you need to communicate. For most people, that area is on the left side of the brain. Aphasia does not affect your intelligence, but you may struggle to talk, understand speech, read, or write. Aphasia can happen to anyone at any age, but it is most common in older age. CAUSES  An interruption of blood supply to the brain (stroke) is the most common cause of aphasia. Any disease or disorder that damages the communication areas of the brain can cause aphasia. This includes:   Brain tumors.  Brain injuries.  Brain infections.  Progressive diseases of the nervous system (neurological disorders). RISK FACTORS You may be at risk for aphasia if you have had any trauma, disease, or disorder that damaged the communication areas of the brain. SIGNS AND SYMPTOMS  Aphasia may start suddenly if it is caused by a stroke or brain injury. Aphasia caused by a tumor or a progressive neurological disorder may start gradually. The condition affects people differently. Signs and symptoms of aphasia include:  Trouble finding the right word.  Using the wrong words.  Talking in sentences that do not make sense.  Making up words.  Being unable to understand other people's speech.  Having problems writing, spelling, or reading.  Having trouble with numbers.  Having trouble swallowing. DIAGNOSIS  Your health care provider may suspect you have aphasia if you lose the ability to speak or understand language. You may need to see a specialist (speech and language pathologist) to help determine the diagnosis of aphasia. This person may do a series of tests to check your ability to:  Speak.  Express ideas.  Make conversation.  Understand speech.  Read and write. TREATMENT  In some cases, aphasia may improve on its own over time. Treatment for aphasia usually involves therapy with a pathologist. Your treatment will be designed to meet your needs  and abilities. Common treatments include:  Speech therapy.  Learning other ways to communicate.  Working with family members to find the best ways to communicate.  Working with an occupational therapist to find ways to communicate at work. HOME CARE INSTRUCTIONS  Keep all follow-up appointments.  Make sure you have a good support system at home.  The following techniques may be helpful while communicating:  Use short, simple sentences. Ask family members to do the same. Sentences that require one-word answers are easiest.  Avoid distractions like background noise when trying to listen or talk.  Try communicating with gestures, pointing, or drawing.  Talk slowly. Ask family members to talk to you slowly.  Maintain eye contact when communicating. SEEK MEDICAL CARE IF:  Your symptoms change or get worse.  You need more support at home.  You are struggling with anxiety or depression.  You develop trouble swallowing.   This information is not intended to replace advice given to you by your health care provider. Make sure you discuss any questions you have with your health care provider.   Document Released: 08/14/2002 Document Revised: 12/13/2014 Document Reviewed: 02/11/2014 Elsevier Interactive Patient Education 2016 Elsevier Inc.  

## 2016-04-09 ENCOUNTER — Other Ambulatory Visit: Payer: Self-pay

## 2016-04-09 MED ORDER — LEVOFLOXACIN 500 MG PO TABS: 500.0000 mg | ORAL_TABLET | Freq: Every day | ORAL | Status: DC

## 2016-04-14 ENCOUNTER — Ambulatory Visit: Payer: Medicare Other | Admitting: Family Medicine

## 2016-04-16 ENCOUNTER — Ambulatory Visit (INDEPENDENT_AMBULATORY_CARE_PROVIDER_SITE_OTHER): Payer: Medicare Other | Admitting: Family Medicine

## 2016-04-16 ENCOUNTER — Encounter: Payer: Self-pay | Admitting: Family Medicine

## 2016-04-16 VITALS — BP 120/80 | HR 90 | Ht 61.0 in | Wt 133.0 lb

## 2016-04-16 DIAGNOSIS — J40 Bronchitis, not specified as acute or chronic: Secondary | ICD-10-CM | POA: Diagnosis not present

## 2016-04-16 DIAGNOSIS — J432 Centrilobular emphysema: Secondary | ICD-10-CM | POA: Diagnosis not present

## 2016-04-16 DIAGNOSIS — F419 Anxiety disorder, unspecified: Secondary | ICD-10-CM | POA: Diagnosis not present

## 2016-04-16 MED ORDER — ALPRAZOLAM 0.25 MG PO TABS: 0.2500 mg | ORAL_TABLET | Freq: Two times a day (BID) | ORAL | Status: DC | PRN

## 2016-04-16 NOTE — Progress Notes (Signed)
Name: Judy Bolton   MRN: [id number]    DOB: [birth year]   Date:04/16/2016       Progress Note  Subjective  Chief Complaint  Chief Complaint  Patient presents with  . Bronchitis    today is last day of Levaquin- better now    Cough This is a recurrent problem. The current episode started 1 to 4 weeks ago. The problem has been gradually improving. The cough is non-productive. Pertinent negatives include no chest pain, chills, ear congestion, ear pain, fever, headaches, heartburn, hemoptysis, myalgias, nasal congestion, postnasal drip, rash, rhinorrhea, sore throat, shortness of breath, sweats, weight loss or wheezing. Nothing aggravates the symptoms. The treatment provided moderate relief. Her past medical history is significant for asthma and COPD. There is no history of environmental allergies.  Shortness of Breath This is a chronic problem. The current episode started 1 to 4 weeks ago. The problem has been gradually improving. Pertinent negatives include no abdominal pain, chest pain, ear pain, fever, headaches, hemoptysis, leg swelling, neck pain, PND, rash, rhinorrhea, sore throat, sputum production or wheezing. The symptoms are aggravated by pollens and weather changes. She has tried beta agonist inhalers, leukotriene antagonists, steroid inhalers and ipratropium inhalers for the symptoms. The treatment provided mild relief. Her past medical history is significant for asthma and COPD.    No problem-specific assessment & plan notes found for this encounter.   Past Medical History  Diagnosis Date  . Hypertension   . Diabetes mellitus without complication (HCC)   . Hyperlipidemia   . Back pain   . Asthma   . Acid reflux   . Back pain   . Arthritis   . COPD (chronic obstructive pulmonary disease) [facility])     Past Surgical History  Procedure Laterality Date  . Abdominal hysterectomy    . Rotator cuff repair Right   . Appendectomy      Family History  Problem Relation Age of  Onset  . Heart attack Mother   . Cancer Father     Social History   Social History  . Marital Status: Widowed    Spouse Name: N/A  . Number of Children: N/A  . Years of Education: N/A   Occupational History  . Not on file.   Social History Main Topics  . Smoking status: Former Smoker    Quit date: 08/14/2000  . Smokeless tobacco: Not on file  . Alcohol Use: Yes  . Drug Use: No  . Sexual Activity: Not Currently   Other Topics Concern  . Not on file   Social History Narrative    Allergies  Allergen Reactions  . Codeine Itching  . Iodine Swelling  . Latex     Other reaction(s): Other (See Comments)  . Penicillins Other (See Comments)    Paralysis Has patient had a PCN reaction causing immediate rash, facial/tongue/throat swelling, SOB or lightheadedness with hypotension: No Has patient had a PCN reaction causing severe rash involving mucus membranes or skin necrosis: No Has patient had a PCN reaction that required hospitalization Yes Has patient had a PCN reaction occurring within the last 10 years: No If all of the above answers are "NO", then may proceed with Cephalosporin use.     Review of Systems  Constitutional: Negative for fever, chills, weight loss and malaise/fatigue.  HENT: Negative for ear discharge, ear pain, postnasal drip, rhinorrhea and sore throat.   Eyes: Negative for blurred vision.  Respiratory: Positive for cough. Negative for hemoptysis, sputum production, shortness of  breath and wheezing.   Cardiovascular: Negative for chest pain, palpitations, leg swelling and PND.  Gastrointestinal: Negative for heartburn, nausea, abdominal pain, diarrhea, constipation, blood in stool and melena.  Genitourinary: Negative for dysuria, urgency, frequency and hematuria.  Musculoskeletal: Negative for myalgias, back pain, joint pain and neck pain.  Skin: Negative for rash.  Neurological: Negative for dizziness, tingling, sensory change, focal weakness and  headaches.  Endo/Heme/Allergies: Negative for environmental allergies and polydipsia. Does not bruise/bleed easily.  Psychiatric/Behavioral: Negative for depression and suicidal ideas. The patient is not nervous/anxious and does not have insomnia.      Objective  Filed Vitals:   04/16/16 1023  BP: 120/80  Pulse: 90  Height: 5\' 1"  (1.549 m)  Weight: 133 lb (60.328 kg)    Physical Exam  Constitutional: She is well-developed, well-nourished, and in no distress. No distress.  HENT:  Head: Normocephalic and atraumatic.  Right Ear: External ear normal.  Left Ear: External ear normal.  Nose: Nose normal.  Mouth/Throat: Oropharynx is clear and moist.  Eyes: Conjunctivae and EOM are normal. Pupils are equal, round, and reactive to light. Right eye exhibits no discharge. Left eye exhibits no discharge.  Neck: Normal range of motion. Neck supple. No JVD present. No thyromegaly present.  Cardiovascular: Normal rate, regular rhythm, normal heart sounds and intact distal pulses.  Exam reveals no gallop and no friction rub.   No murmur heard. Pulmonary/Chest: Effort normal and breath sounds normal.  Abdominal: Soft. Bowel sounds are normal. She exhibits no mass. There is no tenderness. There is no guarding.  Musculoskeletal: Normal range of motion. She exhibits no edema.  Lymphadenopathy:    She has no cervical adenopathy.  Neurological: She is alert. She has normal reflexes.  Skin: Skin is warm and dry. She is not diaphoretic.  Psychiatric: Mood and affect normal.  Nursing note and vitals reviewed.     Assessment & Plan  Problem List Items Addressed This Visit      Respiratory   Bronchitis - Primary   Centrilobular emphysema (HCC)     Other   Acute anxiety   Relevant Medications   ALPRAZolam (XANAX) 0.25 MG tablet     Gave samples of Symbicort and Spiriva   Dr. [facility]  04/16/2016

## 2016-05-04 ENCOUNTER — Other Ambulatory Visit: Payer: Self-pay

## 2016-05-04 ENCOUNTER — Other Ambulatory Visit: Payer: Self-pay | Admitting: Family Medicine

## 2016-05-13 ENCOUNTER — Encounter: Payer: Self-pay | Admitting: Family Medicine

## 2016-05-13 ENCOUNTER — Ambulatory Visit (INDEPENDENT_AMBULATORY_CARE_PROVIDER_SITE_OTHER): Payer: Medicare Other | Admitting: Family Medicine

## 2016-05-13 ENCOUNTER — Ambulatory Visit
Admission: RE | Admit: 2016-05-13 | Discharge: 2016-05-13 | Disposition: A | Discharge status: Home / Self Care | Payer: Medicare Other | Source: Ambulatory Visit | Attending: Family Medicine | Admitting: Family Medicine

## 2016-05-13 VITALS — BP 130/88 | HR 72 | Temp 98.0°F | Ht 61.0 in | Wt 136.0 lb

## 2016-05-13 DIAGNOSIS — J4521 Mild intermittent asthma with (acute) exacerbation: Secondary | ICD-10-CM | POA: Diagnosis not present

## 2016-05-13 DIAGNOSIS — J449 Chronic obstructive pulmonary disease, unspecified: Secondary | ICD-10-CM | POA: Diagnosis not present

## 2016-05-13 DIAGNOSIS — J40 Bronchitis, not specified as acute or chronic: Secondary | ICD-10-CM | POA: Diagnosis not present

## 2016-05-13 DIAGNOSIS — J432 Centrilobular emphysema: Secondary | ICD-10-CM

## 2016-05-13 DIAGNOSIS — R05 Cough: Secondary | ICD-10-CM | POA: Diagnosis not present

## 2016-05-13 MED ORDER — GUAIFENESIN-CODEINE 100-10 MG/5ML PO SYRP: 5.0000 mL | ORAL_SOLUTION | Freq: Three times a day (TID) | ORAL | Status: DC | PRN

## 2016-05-13 MED ORDER — PREDNISONE 5 MG PO TABS: 5.0000 mg | ORAL_TABLET | Freq: Every day | ORAL | Status: DC

## 2016-05-13 MED ORDER — DOXYCYCLINE HYCLATE 100 MG PO TABS: 100.0000 mg | ORAL_TABLET | Freq: Two times a day (BID) | ORAL | Status: DC

## 2016-05-13 NOTE — Progress Notes (Signed)
Name: Judy Bolton   MRN: [id number]    DOB: 1946/09/09   Date:05/13/2016       Progress Note  Subjective  Chief Complaint  Chief Complaint  Patient presents with  . Bronchitis    had a round of Levaquin- finished on 04/19/16- worked around someone sick this weekend- has a cough- yellow production and nasal drainage    Cough This is a recurrent problem. The current episode started more than 1 month ago. The problem has been waxing and waning. The problem occurs every few minutes. The cough is productive of purulent sputum. Associated symptoms include a fever, shortness of breath and wheezing. Pertinent negatives include no chest pain, chills, ear congestion, ear pain, headaches, heartburn, hemoptysis, myalgias, nasal congestion, postnasal drip, rash, rhinorrhea, sore throat, sweats or weight loss. The symptoms are aggravated by pollens. She has tried a beta-agonist inhaler and steroid inhaler for the symptoms. The treatment provided mild relief. Her past medical history is significant for COPD and emphysema. There is no history of asthma, bronchiectasis, bronchitis, environmental allergies or pneumonia.    No problem-specific assessment & plan notes found for this encounter.   Past Medical History  Diagnosis Date  . Hypertension   . Diabetes mellitus without complication (HCC)   . Hyperlipidemia   . Back pain   . Asthma   . Acid reflux   . Back pain   . Arthritis   . COPD (chronic obstructive pulmonary disease) [facility])     Past Surgical History  Procedure Laterality Date  . Abdominal hysterectomy    . Rotator cuff repair Right   . Appendectomy      Family History  Problem Relation Age of Onset  . Heart attack Mother   . Cancer Father     Social History   Social History  . Marital Status: Widowed    Spouse Name: N/A  . Number of Children: N/A  . Years of Education: N/A   Occupational History  . Not on file.   Social History Main Topics  . Smoking status: Former  Smoker    Quit date: 08/14/2000  . Smokeless tobacco: Not on file  . Alcohol Use: Yes  . Drug Use: No  . Sexual Activity: Not Currently   Other Topics Concern  . Not on file   Social History Narrative    Allergies  Allergen Reactions  . Codeine Itching  . Iodine Swelling  . Latex     Other reaction(s): Other (See Comments)  . Penicillins Other (See Comments)    Paralysis Has patient had a PCN reaction causing immediate rash, facial/tongue/throat swelling, SOB or lightheadedness with hypotension: No Has patient had a PCN reaction causing severe rash involving mucus membranes or skin necrosis: No Has patient had a PCN reaction that required hospitalization Yes Has patient had a PCN reaction occurring within the last 10 years: No If all of the above answers are "NO", then may proceed with Cephalosporin use.     Review of Systems  Constitutional: Positive for fever. Negative for chills, weight loss and malaise/fatigue.  HENT: Negative for ear discharge, ear pain, postnasal drip, rhinorrhea and sore throat.   Eyes: Negative for blurred vision.  Respiratory: Positive for cough, shortness of breath and wheezing. Negative for hemoptysis and sputum production.   Cardiovascular: Negative for chest pain, palpitations and leg swelling.  Gastrointestinal: Negative for heartburn, nausea, abdominal pain, diarrhea, constipation, blood in stool and melena.  Genitourinary: Negative for dysuria, urgency, frequency and hematuria.  Musculoskeletal: Negative for myalgias, back pain, joint pain and neck pain.  Skin: Negative for rash.  Neurological: Negative for dizziness, tingling, sensory change, focal weakness and headaches.  Endo/Heme/Allergies: Negative for environmental allergies and polydipsia. Does not bruise/bleed easily.  Psychiatric/Behavioral: Negative for depression and suicidal ideas. The patient is not nervous/anxious and does not have insomnia.      Objective  Filed Vitals:    05/13/16 1334  BP: 130/88  Pulse: 72  Temp: 98 F (36.7 C)  TempSrc: Oral  Height: 5\' 1"  (1.549 m)  Weight: 136 lb (61.689 kg)  SpO2: 97%    Physical Exam  Constitutional: She is well-developed, well-nourished, and in no distress. No distress.  HENT:  Head: Normocephalic and atraumatic.  Right Ear: Tympanic membrane, external ear and ear canal normal.  Left Ear: Tympanic membrane, external ear and ear canal normal.  Nose: Nose normal.  Mouth/Throat: Oropharynx is clear and moist.  Eyes: Conjunctivae and EOM are normal. Pupils are equal, round, and reactive to light. Right eye exhibits no discharge. Left eye exhibits no discharge.  Neck: Normal range of motion. Neck supple. No JVD present. No thyromegaly present.  Cardiovascular: Normal rate, regular rhythm, normal heart sounds and intact distal pulses.  Exam reveals no gallop and no friction rub.   No murmur heard. Pulmonary/Chest: Effort normal. No respiratory distress. She has wheezes. She has no rales.  Abdominal: Soft. Bowel sounds are normal. She exhibits no mass. There is no tenderness. There is no guarding.  Musculoskeletal: Normal range of motion. She exhibits no edema.  Lymphadenopathy:    She has no cervical adenopathy.  Neurological: She is alert. She has normal reflexes.  Skin: Skin is warm and dry. She is not diaphoretic.  Psychiatric: Mood and affect normal.  Nursing note and vitals reviewed.     Assessment & Plan  Problem List Items Addressed This Visit      Respiratory   Bronchitis   Relevant Medications   doxycycline (VIBRA-TABS) 100 MG tablet   guaiFENesin-codeine (ROBITUSSIN AC) 100-10 MG/5ML syrup   Other Relevant Orders   DG Chest 2 View   Centrilobular emphysema (HCC) - Primary   Relevant Medications   predniSONE (DELTASONE) 5 MG tablet   guaiFENesin-codeine (ROBITUSSIN AC) 100-10 MG/5ML syrup    Other Visit Diagnoses    Acute asthma exacerbation, mild intermittent        sample breo     Relevant Medications    predniSONE (DELTASONE) 5 MG tablet    guaiFENesin-codeine (ROBITUSSIN AC) 100-10 MG/5ML syrup    Other Relevant Orders    DG Chest 2 View         Dr. [facility]  05/13/2016

## 2016-05-14 ENCOUNTER — Other Ambulatory Visit: Payer: Self-pay | Admitting: Family Medicine

## 2016-05-19 ENCOUNTER — Other Ambulatory Visit: Payer: Self-pay | Admitting: Family Medicine

## 2016-05-19 ENCOUNTER — Other Ambulatory Visit: Payer: Self-pay

## 2016-06-08 ENCOUNTER — Other Ambulatory Visit: Payer: Self-pay | Admitting: Family Medicine

## 2016-06-09 ENCOUNTER — Other Ambulatory Visit: Payer: Self-pay

## 2016-06-09 MED ORDER — FLUTICASONE FUROATE-VILANTEROL 200-25 MCG/INH IN AEPB: 1.0000 | INHALATION_SPRAY | Freq: Every day | RESPIRATORY_TRACT | Status: DC

## 2016-06-14 ENCOUNTER — Other Ambulatory Visit: Payer: Self-pay | Admitting: Family Medicine

## 2016-06-25 ENCOUNTER — Other Ambulatory Visit: Payer: Self-pay | Admitting: Family Medicine

## 2016-06-28 DIAGNOSIS — R809 Proteinuria, unspecified: Secondary | ICD-10-CM | POA: Diagnosis not present

## 2016-06-28 DIAGNOSIS — E1129 Type 2 diabetes mellitus with other diabetic kidney complication: Secondary | ICD-10-CM | POA: Diagnosis not present

## 2016-07-03 ENCOUNTER — Other Ambulatory Visit: Payer: Self-pay | Admitting: Family Medicine

## 2016-07-06 DIAGNOSIS — E1129 Type 2 diabetes mellitus with other diabetic kidney complication: Secondary | ICD-10-CM | POA: Diagnosis not present

## 2016-07-06 DIAGNOSIS — E785 Hyperlipidemia, unspecified: Secondary | ICD-10-CM | POA: Diagnosis not present

## 2016-07-06 DIAGNOSIS — E875 Hyperkalemia: Secondary | ICD-10-CM | POA: Diagnosis not present

## 2016-07-06 DIAGNOSIS — R809 Proteinuria, unspecified: Secondary | ICD-10-CM | POA: Diagnosis not present

## 2016-07-06 DIAGNOSIS — E781 Pure hyperglyceridemia: Secondary | ICD-10-CM | POA: Diagnosis not present

## 2016-07-08 ENCOUNTER — Other Ambulatory Visit: Payer: Self-pay | Admitting: Family Medicine

## 2016-07-13 DIAGNOSIS — E875 Hyperkalemia: Secondary | ICD-10-CM | POA: Diagnosis not present

## 2016-08-04 ENCOUNTER — Encounter: Payer: Self-pay | Admitting: Family Medicine

## 2016-08-04 ENCOUNTER — Ambulatory Visit (INDEPENDENT_AMBULATORY_CARE_PROVIDER_SITE_OTHER): Payer: Medicare Other | Admitting: Family Medicine

## 2016-08-04 VITALS — BP 120/64 | HR 76 | Ht 61.0 in | Wt 129.0 lb

## 2016-08-04 DIAGNOSIS — N3090 Cystitis, unspecified without hematuria: Secondary | ICD-10-CM

## 2016-08-04 DIAGNOSIS — N760 Acute vaginitis: Secondary | ICD-10-CM | POA: Diagnosis not present

## 2016-08-04 LAB — POCT URINALYSIS DIPSTICK
BILIRUBIN UA: NEGATIVE
Blood, UA: NEGATIVE
Glucose, UA: NEGATIVE
Ketones, UA: NEGATIVE
Nitrite, UA: POSITIVE
Protein, UA: NEGATIVE
SPEC GRAV UA: 1.01
Urobilinogen, UA: 0.2
pH, UA: 6

## 2016-08-04 MED ORDER — URELLE 81 MG PO TABS: 1.0000 | ORAL_TABLET | Freq: Every day | ORAL | 2 refills | Status: DC

## 2016-08-04 MED ORDER — URELLE 81 MG PO TABS: 1.0000 | ORAL_TABLET | Freq: Every day | ORAL | Status: DC

## 2016-08-04 MED ORDER — CIPROFLOXACIN HCL 250 MG PO TABS: 250.0000 mg | ORAL_TABLET | Freq: Two times a day (BID) | ORAL | 0 refills | Status: DC

## 2016-08-04 MED ORDER — METRONIDAZOLE 500 MG PO TABS: 500.0000 mg | ORAL_TABLET | Freq: Two times a day (BID) | ORAL | 0 refills | Status: DC

## 2016-08-04 NOTE — Progress Notes (Signed)
Name: Judy Bolton   MRN: [id number]    DOB: 05-Oct-1946   Date:08/04/2016       Progress Note  Subjective  Chief Complaint  Chief Complaint  Patient presents with  . Urinary Tract Infection    pressure at end of stream, frequent urination and odor to urine    Urinary Tract Infection   This is a new problem. The current episode started in the past 7 days. The problem occurs intermittently. The problem has been waxing and waning. The quality of the pain is described as burning. The pain is mild. There has been no fever. There is no history of pyelonephritis. Pertinent negatives include no chills, discharge, flank pain, frequency, hematuria, hesitancy, nausea, possible pregnancy, sweats, urgency or vomiting. She has tried nothing for the symptoms. The treatment provided no relief. There is no history of catheterization, kidney stones, recurrent UTIs, a single kidney, urinary stasis or a urological procedure.    No problem-specific Assessment & Plan notes found for this encounter.   Past Medical History:  Diagnosis Date  . Acid reflux   . Arthritis   . Asthma   . Back pain   . Back pain   . COPD (chronic obstructive pulmonary disease) (HCC)   . Diabetes mellitus without complication (HCC)   . Hyperlipidemia   . Hypertension     Past Surgical History:  Procedure Laterality Date  . ABDOMINAL HYSTERECTOMY    . APPENDECTOMY    . ROTATOR CUFF REPAIR Right     Family History  Problem Relation Age of Onset  . Heart attack Mother   . Cancer Father     Social History   Social History  . Marital status: Widowed    Spouse name: N/A  . Number of children: N/A  . Years of education: N/A   Occupational History  . Not on file.   Social History Main Topics  . Smoking status: Former Smoker    Quit date: 08/14/2000  . Smokeless tobacco: Not on file  . Alcohol use Yes  . Drug use: No  . Sexual activity: Not Currently   Other Topics Concern  . Not on file   Social History  Narrative  . No narrative on file    Allergies  Allergen Reactions  . Codeine Itching  . Iodine Swelling  . Latex     Other reaction(s): Other (See Comments)  . Penicillins Other (See Comments)    Paralysis Has patient had a PCN reaction causing immediate rash, facial/tongue/throat swelling, SOB or lightheadedness with hypotension: No Has patient had a PCN reaction causing severe rash involving mucus membranes or skin necrosis: No Has patient had a PCN reaction that required hospitalization Yes Has patient had a PCN reaction occurring within the last 10 years: No If all of the above answers are "NO", then may proceed with Cephalosporin use.     Review of Systems  Constitutional: Negative for chills, fever, malaise/fatigue and weight loss.  HENT: Negative for ear discharge, ear pain and sore throat.   Eyes: Negative for blurred vision.  Respiratory: Negative for cough, sputum production, shortness of breath and wheezing.   Cardiovascular: Negative for chest pain, palpitations and leg swelling.  Gastrointestinal: Negative for abdominal pain, blood in stool, constipation, diarrhea, heartburn, melena, nausea and vomiting.  Genitourinary: Negative for dysuria, flank pain, frequency, hematuria, hesitancy and urgency.  Musculoskeletal: Negative for back pain, joint pain, myalgias and neck pain.  Skin: Negative for rash.  Neurological: Negative for dizziness, tingling,  sensory change, focal weakness and headaches.  Endo/Heme/Allergies: Negative for environmental allergies and polydipsia. Does not bruise/bleed easily.  Psychiatric/Behavioral: Negative for depression and suicidal ideas. The patient is not nervous/anxious and does not have insomnia.      Objective  Vitals:   08/04/16 1007  BP: 120/64  Pulse: 76  Weight: 129 lb (58.5 kg)  Height: 5\' 1"  (1.549 m)    Physical Exam  Constitutional: She is well-developed, well-nourished, and in no distress. No distress.  HENT:  Head:  Normocephalic and atraumatic.  Right Ear: External ear normal.  Left Ear: External ear normal.  Nose: Nose normal.  Mouth/Throat: Oropharynx is clear and moist.  Eyes: Conjunctivae and EOM are normal. Pupils are equal, round, and reactive to light. Right eye exhibits no discharge. Left eye exhibits no discharge.  Neck: Normal range of motion. Neck supple. No JVD present. No thyromegaly present.  Cardiovascular: Normal rate, regular rhythm, normal heart sounds and intact distal pulses.  Exam reveals no gallop and no friction rub.   No murmur heard. Pulmonary/Chest: Effort normal and breath sounds normal.  Abdominal: Soft. Bowel sounds are normal. She exhibits no mass. There is no tenderness. There is no guarding.  Musculoskeletal: Normal range of motion. She exhibits no edema.  Lymphadenopathy:    She has no cervical adenopathy.  Neurological: She is alert. She has normal reflexes.  Skin: Skin is warm and dry. She is not diaphoretic.  Psychiatric: Mood and affect normal.  Nursing note and vitals reviewed.     Assessment & Plan  Problem List Items Addressed This Visit    None    Visit Diagnoses    Cystitis    -  Primary   Relevant Medications   ciprofloxacin (CIPRO) 250 MG tablet   Other Relevant Orders   POCT Urinalysis Dipstick (Completed)   Vaginosis       Relevant Medications   metroNIDAZOLE (FLAGYL) 500 MG tablet        Dr. [facility]  08/04/16

## 2016-08-05 ENCOUNTER — Other Ambulatory Visit: Payer: Self-pay | Admitting: Family Medicine

## 2016-08-17 ENCOUNTER — Other Ambulatory Visit: Payer: Self-pay | Admitting: Family Medicine

## 2016-08-17 DIAGNOSIS — E119 Type 2 diabetes mellitus without complications: Secondary | ICD-10-CM | POA: Diagnosis not present

## 2016-09-09 ENCOUNTER — Ambulatory Visit (INDEPENDENT_AMBULATORY_CARE_PROVIDER_SITE_OTHER): Payer: Medicare Other

## 2016-09-09 DIAGNOSIS — Z23 Encounter for immunization: Secondary | ICD-10-CM

## 2016-09-10 ENCOUNTER — Other Ambulatory Visit: Payer: Self-pay | Admitting: Family Medicine

## 2016-09-10 DIAGNOSIS — M25532 Pain in left wrist: Secondary | ICD-10-CM | POA: Diagnosis not present

## 2016-09-10 DIAGNOSIS — S60212A Contusion of left wrist, initial encounter: Secondary | ICD-10-CM | POA: Diagnosis not present

## 2016-09-14 ENCOUNTER — Other Ambulatory Visit: Payer: Self-pay | Admitting: Family Medicine

## 2016-10-05 ENCOUNTER — Ambulatory Visit (INDEPENDENT_AMBULATORY_CARE_PROVIDER_SITE_OTHER): Payer: Medicare Other | Admitting: Family Medicine

## 2016-10-05 ENCOUNTER — Encounter: Payer: Self-pay | Admitting: Family Medicine

## 2016-10-05 VITALS — BP 152/82 | HR 66 | Ht 62.0 in | Wt 131.0 lb

## 2016-10-05 DIAGNOSIS — J0100 Acute maxillary sinusitis, unspecified: Secondary | ICD-10-CM | POA: Diagnosis not present

## 2016-10-05 DIAGNOSIS — S62102A Fracture of unspecified carpal bone, left wrist, initial encounter for closed fracture: Secondary | ICD-10-CM

## 2016-10-05 MED ORDER — AZITHROMYCIN 250 MG PO TABS: ORAL_TABLET | ORAL | 0 refills | Status: DC

## 2016-10-05 NOTE — Progress Notes (Signed)
Name: Judy Bolton   MRN: [id number]    DOB: [birth year]   Date:10/05/2016       Progress Note  Subjective  Chief Complaint  Chief Complaint  Patient presents with  . Sinusitis    face pressure and soreness. no production.  . Hand Pain    Both hands are hurting. Pt says its due to arthritis. Swelling on the top of right hand. Left habnd has been hurting for 5 weeks. Was seen at [facility] in [place] for Left hand.    Sinusitis  This is a new problem. The current episode started in the past 7 days (5 wks). The problem has been gradually worsening since onset. There has been no fever. Her pain is at a severity of 3/10. The pain is moderate. Pertinent negatives include no chills, congestion, coughing, diaphoresis, ear pain, headaches, hoarse voice, neck pain, shortness of breath, sinus pressure, sneezing, sore throat or swollen glands. Past treatments include nothing. The treatment provided moderate relief.  Hand Pain   The incident occurred more than 1 week ago (carrying case of wine). [place] The injury mechanism was twisted. The pain is present in the left wrist and right wrist. The quality of the pain is described as aching. The pain is at a severity of 7/10. The pain is moderate. The pain has been fluctuating since the incident. Associated symptoms include muscle weakness, numbness and tingling. Pertinent negatives include no chest pain. The symptoms are aggravated by movement. She has tried acetaminophen, NSAIDs and immobilization for the symptoms. The treatment provided mild relief.    No problem-specific Assessment & Plan notes found for this encounter.   Past Medical History:  Diagnosis Date  . Acid reflux   . Arthritis   . Asthma   . Back pain   . Back pain   . COPD (chronic obstructive pulmonary disease) (HCC)   . Diabetes mellitus without complication (HCC)   . Hyperlipidemia   . Hypertension     Past Surgical History:  Procedure Laterality Date    . ABDOMINAL HYSTERECTOMY    . APPENDECTOMY    . ROTATOR CUFF REPAIR Right     Family History  Problem Relation Age of Onset  . Heart attack Mother   . Cancer Father     Social History   Social History  . Marital status: Widowed    Spouse name: N/A  . Number of children: N/A  . Years of education: N/A   Occupational History  . Not on file.   Social History Main Topics  . Smoking status: Former Smoker    Quit date: 08/14/2000  . Smokeless tobacco: Not on file  . Alcohol use Yes  . Drug use: No  . Sexual activity: Not Currently   Other Topics Concern  . Not on file   Social History Narrative  . No narrative on file    Allergies  Allergen Reactions  . Codeine Itching  . Iodine Swelling  . Latex     Other reaction(s): Other (See Comments)  . Penicillins Other (See Comments)    Paralysis Has patient had a PCN reaction causing immediate rash, facial/tongue/throat swelling, SOB or lightheadedness with hypotension: No Has patient had a PCN reaction causing severe rash involving mucus membranes or skin necrosis: No Has patient had a PCN reaction that required hospitalization Yes Has patient had a PCN reaction occurring within the last 10 years: No If all of the above answers are "NO", then may proceed with Cephalosporin  use.     Review of Systems  Constitutional: Negative for chills, diaphoresis, fever, malaise/fatigue and weight loss.  HENT: Negative for congestion, ear discharge, ear pain, hoarse voice, sinus pressure, sneezing and sore throat.   Eyes: Negative for blurred vision.  Respiratory: Negative for cough, sputum production, shortness of breath and wheezing.   Cardiovascular: Negative for chest pain, palpitations and leg swelling.  Gastrointestinal: Negative for abdominal pain, blood in stool, constipation, diarrhea, heartburn, melena and nausea.  Genitourinary: Negative for dysuria, frequency, hematuria and urgency.  Musculoskeletal: Positive for back pain  and joint pain. Negative for myalgias and neck pain.  Skin: Negative for rash.  Neurological: Positive for tingling and numbness. Negative for dizziness, sensory change, focal weakness and headaches.  Endo/Heme/Allergies: Negative for environmental allergies and polydipsia. Does not bruise/bleed easily.  Psychiatric/Behavioral: Negative for depression and suicidal ideas. The patient is not nervous/anxious and does not have insomnia.      Objective  Vitals:   10/05/16 1350  BP: (!) 152/82  Pulse: 66  Weight: 131 lb (59.4 kg)  Height: 5\' 2"  (1.575 m)    Physical Exam  Constitutional: She is well-developed, well-nourished, and in no distress. No distress.  HENT:  Head: Normocephalic and atraumatic.  Right Ear: External ear normal.  Left Ear: External ear normal.  Nose: Nose normal.  Mouth/Throat: Oropharynx is clear and moist.  Eyes: Conjunctivae and EOM are normal. Pupils are equal, round, and reactive to light. Right eye exhibits no discharge. Left eye exhibits no discharge.  Neck: Normal range of motion. Neck supple. No JVD present. No thyromegaly present.  Cardiovascular: Normal rate, regular rhythm, normal heart sounds and intact distal pulses.  Exam reveals no gallop and no friction rub.   No murmur heard. Pulmonary/Chest: Effort normal and breath sounds normal. She has no wheezes. She has no rales.  Abdominal: Soft. Bowel sounds are normal. She exhibits no mass. There is no tenderness. There is no guarding.  Musculoskeletal: She exhibits no edema.       Left wrist: She exhibits decreased range of motion, tenderness, bony tenderness and swelling.       Arms: Lymphadenopathy:    She has no cervical adenopathy.  Neurological: She is alert. She has normal reflexes.  Skin: Skin is warm and dry. She is not diaphoretic. No erythema.  Psychiatric: Mood and affect normal.  Nursing note and vitals reviewed.     Assessment & Plan  Problem List Items Addressed This Visit     None    Visit Diagnoses    Avulsion fracture of left wrist    -  Primary   vs chip fx/no relief after 5 weeks in ace with etodolac   Relevant Orders   Ambulatory referral to Orthopedic Surgery   Acute non-recurrent maxillary sinusitis       Relevant Medications   azithromycin (ZITHROMAX) 250 MG tablet        Dr. [facility]  10/05/16

## 2016-10-08 DIAGNOSIS — G8929 Other chronic pain: Secondary | ICD-10-CM | POA: Diagnosis not present

## 2016-10-08 DIAGNOSIS — S63502A Unspecified sprain of left wrist, initial encounter: Secondary | ICD-10-CM | POA: Diagnosis not present

## 2016-10-08 DIAGNOSIS — M25532 Pain in left wrist: Secondary | ICD-10-CM | POA: Diagnosis not present

## 2016-10-08 DIAGNOSIS — M2550 Pain in unspecified joint: Secondary | ICD-10-CM | POA: Diagnosis not present

## 2016-10-10 ENCOUNTER — Other Ambulatory Visit: Payer: Self-pay | Admitting: Family Medicine

## 2016-10-11 ENCOUNTER — Other Ambulatory Visit: Payer: Self-pay | Admitting: Family Medicine

## 2016-10-25 ENCOUNTER — Other Ambulatory Visit: Payer: Self-pay | Admitting: Family Medicine

## 2016-11-07 DIAGNOSIS — Z13820 Encounter for screening for osteoporosis: Secondary | ICD-10-CM | POA: Diagnosis not present

## 2016-11-07 DIAGNOSIS — M899 Disorder of bone, unspecified: Secondary | ICD-10-CM | POA: Diagnosis not present

## 2016-11-08 DIAGNOSIS — M25441 Effusion, right hand: Secondary | ICD-10-CM | POA: Diagnosis not present

## 2016-11-08 DIAGNOSIS — M25531 Pain in right wrist: Secondary | ICD-10-CM | POA: Diagnosis not present

## 2016-11-08 DIAGNOSIS — R7982 Elevated C-reactive protein (CRP): Secondary | ICD-10-CM | POA: Insufficient documentation

## 2016-11-08 DIAGNOSIS — M7989 Other specified soft tissue disorders: Secondary | ICD-10-CM | POA: Diagnosis not present

## 2016-11-08 DIAGNOSIS — M79641 Pain in right hand: Secondary | ICD-10-CM | POA: Insufficient documentation

## 2016-11-08 DIAGNOSIS — M79642 Pain in left hand: Secondary | ICD-10-CM

## 2016-11-09 ENCOUNTER — Other Ambulatory Visit: Payer: Self-pay | Admitting: Family Medicine

## 2016-11-12 ENCOUNTER — Other Ambulatory Visit: Payer: Self-pay | Admitting: Family Medicine

## 2016-12-07 ENCOUNTER — Other Ambulatory Visit: Payer: Self-pay | Admitting: Family Medicine

## 2016-12-09 ENCOUNTER — Other Ambulatory Visit: Payer: Self-pay | Admitting: Family Medicine

## 2016-12-18 ENCOUNTER — Other Ambulatory Visit: Payer: Self-pay | Admitting: Family Medicine

## 2016-12-20 ENCOUNTER — Other Ambulatory Visit: Payer: Self-pay | Admitting: Family Medicine

## 2016-12-21 ENCOUNTER — Other Ambulatory Visit: Payer: Self-pay | Admitting: Family Medicine

## 2016-12-28 DIAGNOSIS — M0600 Rheumatoid arthritis without rheumatoid factor, unspecified site: Secondary | ICD-10-CM | POA: Insufficient documentation

## 2016-12-28 DIAGNOSIS — Z79899 Other long term (current) drug therapy: Secondary | ICD-10-CM | POA: Diagnosis not present

## 2016-12-29 ENCOUNTER — Encounter: Payer: Self-pay | Admitting: Family Medicine

## 2016-12-29 ENCOUNTER — Ambulatory Visit (INDEPENDENT_AMBULATORY_CARE_PROVIDER_SITE_OTHER): Payer: Medicare Other | Admitting: Family Medicine

## 2016-12-29 VITALS — BP 110/62 | HR 62 | Temp 97.9°F | Ht 62.0 in | Wt 133.0 lb

## 2016-12-29 DIAGNOSIS — J0100 Acute maxillary sinusitis, unspecified: Secondary | ICD-10-CM

## 2016-12-29 DIAGNOSIS — J40 Bronchitis, not specified as acute or chronic: Secondary | ICD-10-CM | POA: Diagnosis not present

## 2016-12-29 DIAGNOSIS — J4520 Mild intermittent asthma, uncomplicated: Secondary | ICD-10-CM

## 2016-12-29 MED ORDER — TIOTROPIUM BROMIDE MONOHYDRATE 18 MCG IN CAPS: 18.0000 ug | ORAL_CAPSULE | Freq: Every day | RESPIRATORY_TRACT | 11 refills | Status: DC

## 2016-12-29 MED ORDER — ASPIRIN EC 325 MG PO TBEC: 325.0000 mg | DELAYED_RELEASE_TABLET | Freq: Every day | ORAL | 0 refills | Status: DC

## 2016-12-29 MED ORDER — DOXYCYCLINE HYCLATE 100 MG PO TABS: 100.0000 mg | ORAL_TABLET | Freq: Two times a day (BID) | ORAL | 0 refills | Status: DC

## 2016-12-29 MED ORDER — PREDNISONE 5 MG PO TABS: 5.0000 mg | ORAL_TABLET | Freq: Every day | ORAL | 0 refills | Status: DC

## 2016-12-29 NOTE — Progress Notes (Signed)
Name: Judy Bolton   MRN: [id number]    DOB: Jul 07, 1946   Date:12/29/2016       Progress Note  Subjective  Chief Complaint  Chief Complaint  Patient presents with  . Sinusitis    cough and cong, wheezing. Laying down at night- gets worse    Sinusitis  This is a new problem. The current episode started more than 1 year ago. The problem has been waxing and waning since onset. There has been no fever. Her pain is at a severity of 2/10. The pain is mild. Associated symptoms include chills, congestion, coughing, ear pain, headaches, a hoarse voice, neck pain, shortness of breath, sinus pressure, sneezing, a sore throat and swollen glands. Pertinent negatives include no diaphoresis. ("river running in my throat") Past treatments include acetaminophen. The treatment provided no relief.    No problem-specific Assessment & Plan notes found for this encounter.   Past Medical History:  Diagnosis Date  . Acid reflux   . Arthritis   . Asthma   . Back pain   . Back pain   . COPD (chronic obstructive pulmonary disease) (HCC)   . Diabetes mellitus without complication (HCC)   . Hyperlipidemia   . Hypertension     Past Surgical History:  Procedure Laterality Date  . ABDOMINAL HYSTERECTOMY    . APPENDECTOMY    . ROTATOR CUFF REPAIR Right     Family History  Problem Relation Age of Onset  . Heart attack Mother   . Cancer Father     Social History   Social History  . Marital status: Widowed    Spouse name: N/A  . Number of children: N/A  . Years of education: N/A   Occupational History  . Not on file.   Social History Main Topics  . Smoking status: Former Smoker    Quit date: 08/14/2000  . Smokeless tobacco: Never Used  . Alcohol use Yes  . Drug use: No  . Sexual activity: Not Currently   Other Topics Concern  . Not on file   Social History Narrative  . No narrative on file    Allergies  Allergen Reactions  . Codeine Itching  . Iodine Swelling  . Latex      Other reaction(s): Other (See Comments)  . Penicillins Other (See Comments)    Paralysis Has patient had a PCN reaction causing immediate rash, facial/tongue/throat swelling, SOB or lightheadedness with hypotension: No Has patient had a PCN reaction causing severe rash involving mucus membranes or skin necrosis: No Has patient had a PCN reaction that required hospitalization Yes Has patient had a PCN reaction occurring within the last 10 years: No If all of the above answers are "NO", then may proceed with Cephalosporin use.     Review of Systems  Constitutional: Positive for chills. Negative for diaphoresis, fever, malaise/fatigue and weight loss.  HENT: Positive for congestion, ear pain, hoarse voice, sinus pressure, sneezing and sore throat. Negative for ear discharge.   Eyes: Negative for blurred vision.  Respiratory: Positive for cough, sputum production, shortness of breath and wheezing. Negative for hemoptysis.   Cardiovascular: Negative for chest pain, palpitations and leg swelling.  Gastrointestinal: Negative for abdominal pain, blood in stool, constipation, diarrhea, heartburn, melena and nausea.  Genitourinary: Negative for dysuria, frequency, hematuria and urgency.  Musculoskeletal: Positive for neck pain. Negative for back pain, joint pain and myalgias.  Skin: Negative for rash.  Neurological: Positive for headaches. Negative for dizziness, tingling, sensory change and focal weakness.  Endo/Heme/Allergies: Negative for environmental allergies and polydipsia. Does not bruise/bleed easily.  Psychiatric/Behavioral: Negative for depression and suicidal ideas. The patient is not nervous/anxious and does not have insomnia.      Objective  Vitals:   12/29/16 1158  BP: 110/62  Pulse: 62  Temp: 97.9 F (36.6 C)  TempSrc: Oral  SpO2: 97%  Weight: 133 lb (60.3 kg)  Height: 5\' 2"  (1.575 m)    Physical Exam  Constitutional: She is well-developed, well-nourished, and in no  distress. No distress.  HENT:  Head: Normocephalic and atraumatic.  Right Ear: External ear normal.  Left Ear: External ear normal.  Nose: Nose normal.  Mouth/Throat: Oropharynx is clear and moist.  Eyes: Conjunctivae and EOM are normal. Pupils are equal, round, and reactive to light. Right eye exhibits no discharge. Left eye exhibits no discharge.  Neck: Normal range of motion. Neck supple. No JVD present. No thyromegaly present.  Cardiovascular: Normal rate, regular rhythm, normal heart sounds and intact distal pulses.  Exam reveals no gallop and no friction rub.   No murmur heard. Pulmonary/Chest: Effort normal and breath sounds normal. She has no wheezes. She has no rales.  Abdominal: Soft. Bowel sounds are normal. She exhibits no mass. There is no tenderness. There is no guarding.  Musculoskeletal: Normal range of motion. She exhibits no edema.  Lymphadenopathy:    She has no cervical adenopathy.  Neurological: She is alert. She has normal reflexes.  Skin: Skin is warm and dry. She is not diaphoretic.  Psychiatric: Mood and affect normal.  Nursing note and vitals reviewed.     Assessment & Plan  Problem List Items Addressed This Visit      Respiratory   Bronchitis   Relevant Medications   predniSONE (DELTASONE) 5 MG tablet   doxycycline (VIBRA-TABS) 100 MG tablet   tiotropium (SPIRIVA) 18 MCG inhalation capsule    Other Visit Diagnoses    Acute maxillary sinusitis, recurrence not specified    -  Primary   Relevant Medications   methylPREDNISolone (MEDROL DOSEPAK) 4 MG TBPK tablet   predniSONE (DELTASONE) 5 MG tablet   doxycycline (VIBRA-TABS) 100 MG tablet   Mild intermittent asthma, unspecified whether complicated       Relevant Medications   methylPREDNISolone (MEDROL DOSEPAK) 4 MG TBPK tablet   predniSONE (DELTASONE) 5 MG tablet   tiotropium (SPIRIVA) 18 MCG inhalation capsule        Dr. [facility]  12/29/16

## 2017-01-29 ENCOUNTER — Other Ambulatory Visit: Payer: Self-pay | Admitting: Family Medicine

## 2017-01-29 DIAGNOSIS — I10 Essential (primary) hypertension: Secondary | ICD-10-CM

## 2017-01-31 ENCOUNTER — Other Ambulatory Visit: Payer: Self-pay | Admitting: Family Medicine

## 2017-01-31 DIAGNOSIS — I10 Essential (primary) hypertension: Secondary | ICD-10-CM

## 2017-02-07 DIAGNOSIS — Z79899 Other long term (current) drug therapy: Secondary | ICD-10-CM | POA: Diagnosis not present

## 2017-02-07 DIAGNOSIS — M4316 Spondylolisthesis, lumbar region: Secondary | ICD-10-CM | POA: Diagnosis not present

## 2017-02-07 DIAGNOSIS — M0600 Rheumatoid arthritis without rheumatoid factor, unspecified site: Secondary | ICD-10-CM | POA: Diagnosis not present

## 2017-02-16 ENCOUNTER — Other Ambulatory Visit: Payer: Self-pay

## 2017-02-17 DIAGNOSIS — J4531 Mild persistent asthma with (acute) exacerbation: Secondary | ICD-10-CM | POA: Diagnosis not present

## 2017-02-19 ENCOUNTER — Other Ambulatory Visit: Payer: Self-pay | Admitting: Family Medicine

## 2017-02-27 ENCOUNTER — Other Ambulatory Visit: Payer: Self-pay | Admitting: Family Medicine

## 2017-02-27 DIAGNOSIS — I10 Essential (primary) hypertension: Secondary | ICD-10-CM

## 2017-03-02 ENCOUNTER — Other Ambulatory Visit: Payer: Self-pay

## 2017-03-03 ENCOUNTER — Encounter: Payer: Self-pay | Admitting: Family Medicine

## 2017-03-03 ENCOUNTER — Ambulatory Visit (INDEPENDENT_AMBULATORY_CARE_PROVIDER_SITE_OTHER): Payer: Medicare Other | Admitting: Family Medicine

## 2017-03-03 VITALS — BP 122/78 | HR 80 | Temp 97.8°F | Resp 16 | Ht 62.0 in | Wt 133.0 lb

## 2017-03-03 DIAGNOSIS — J432 Centrilobular emphysema: Secondary | ICD-10-CM

## 2017-03-03 DIAGNOSIS — E119 Type 2 diabetes mellitus without complications: Secondary | ICD-10-CM

## 2017-03-03 DIAGNOSIS — J40 Bronchitis, not specified as acute or chronic: Secondary | ICD-10-CM

## 2017-03-03 MED ORDER — GUAIFENESIN-CODEINE 100-10 MG/5ML PO SYRP: 5.0000 mL | ORAL_SOLUTION | Freq: Three times a day (TID) | ORAL | 0 refills | Status: DC | PRN

## 2017-03-03 MED ORDER — PREDNISONE 10 MG PO TABS: 10.0000 mg | ORAL_TABLET | Freq: Every day | ORAL | 0 refills | Status: DC

## 2017-03-03 MED ORDER — LEVOFLOXACIN 500 MG PO TABS: 500.0000 mg | ORAL_TABLET | Freq: Every day | ORAL | 0 refills | Status: DC

## 2017-03-03 NOTE — Progress Notes (Signed)
Name: Judy Bolton   MRN: [id number]    DOB: February 10, 1946   Date:03/03/2017       Progress Note  Subjective  Chief Complaint  Chief Complaint  Patient presents with  . Cough    3 weeks     Cough  This is a recurrent problem. The current episode started in the past 7 days. The problem has been gradually worsening. The problem occurs constantly. The cough is productive of purulent sputum. Associated symptoms include nasal congestion, postnasal drip, shortness of breath and wheezing. Pertinent negatives include no chest pain, chills, ear congestion, ear pain, fever, headaches, heartburn, hemoptysis, myalgias, rash, rhinorrhea, sore throat, sweats or weight loss. The symptoms are aggravated by pollens. She has tried a beta-agonist inhaler for the symptoms. The treatment provided mild relief. Her past medical history is significant for bronchitis and COPD. There is no history of environmental allergies.    No problem-specific Assessment & Plan notes found for this encounter.   Past Medical History:  Diagnosis Date  . Acid reflux   . Arthritis   . Asthma   . Back pain   . Back pain   . COPD (chronic obstructive pulmonary disease) (HCC)   . Diabetes mellitus without complication (HCC)   . Hyperlipidemia   . Hypertension     Past Surgical History:  Procedure Laterality Date  . ABDOMINAL HYSTERECTOMY    . APPENDECTOMY    . ROTATOR CUFF REPAIR Right     Family History  Problem Relation Age of Onset  . Heart attack Mother   . Cancer Father     Social History   Social History  . Marital status: Widowed    Spouse name: N/A  . Number of children: N/A  . Years of education: N/A   Occupational History  . Not on file.   Social History Main Topics  . Smoking status: Former Smoker    Quit date: 08/14/2000  . Smokeless tobacco: Never Used  . Alcohol use Yes  . Drug use: No  . Sexual activity: Not Currently   Other Topics Concern  . Not on file   Social History Narrative   . No narrative on file    Allergies  Allergen Reactions  . Codeine Itching  . Iodine Swelling  . Latex     Other reaction(s): Other (See Comments)  . Penicillins Other (See Comments)    Paralysis Has patient had a PCN reaction causing immediate rash, facial/tongue/throat swelling, SOB or lightheadedness with hypotension: No Has patient had a PCN reaction causing severe rash involving mucus membranes or skin necrosis: No Has patient had a PCN reaction that required hospitalization Yes Has patient had a PCN reaction occurring within the last 10 years: No If all of the above answers are "NO", then may proceed with Cephalosporin use.    Outpatient Medications Prior to Visit  Medication Sig Dispense Refill  . albuterol (PROVENTIL HFA;VENTOLIN HFA) 108 (90 Base) MCG/ACT inhaler Inhale 2 puffs into the lungs every 6 (six) hours as needed for wheezing or shortness of breath.    Marland Kitchen albuterol (PROVENTIL) (2.5 MG/3ML) 0.083% nebulizer solution Take 3 mLs (2.5 mg total) by nebulization every 6 (six) hours as needed for wheezing or shortness of breath. 75 mL 12  . ALPRAZolam (XANAX) 0.25 MG tablet Take 1 tablet (0.25 mg total) by mouth 2 (two) times daily as needed for anxiety. 30 tablet 0  . aspirin EC 325 MG tablet Take 1 tablet (325 mg total) by mouth  daily. 30 tablet 0  . atorvastatin (LIPITOR) 40 MG tablet TAKE 1 TABLET BY MOUTH EVERY DAY 90 tablet 0  . budesonide-formoterol (SYMBICORT) 160-4.5 MCG/ACT inhaler Inhale 2 puffs into the lungs 2 (two) times daily. 1 Inhaler 3  . cetirizine (ZYRTEC) 10 MG tablet Take 10 mg by mouth daily.  1  . gemfibrozil (LOPID) 600 MG tablet TAKE 1 TABLET BY MOUTH TWICE DAILY. 180 tablet 0  . hydrochlorothiazide (HYDRODIURIL) 25 MG tablet TAKE 1 TABLET BY MOUTH EVERY DAY 30 tablet 0  . leflunomide (ARAVA) 20 MG tablet Take 1 tablet by mouth daily. Dr Renard Matter    . losartan (COZAAR) 50 MG tablet TAKE 1 TABLET BY MOUTH TWICE DAILY 180 tablet 0  . metFORMIN  (GLUCOPHAGE) 1000 MG tablet Take 1,000 mg by mouth 2 (two) times daily with a meal.    . metoprolol tartrate (LOPRESSOR) 25 MG tablet TAKE 1/2 TABLET BY MOUTH TWICE DAILY 60 tablet 0  . ranitidine (ZANTAC) 150 MG tablet Take 150 mg by mouth daily.    Marland Kitchen tiotropium (SPIRIVA) 18 MCG inhalation capsule Place 1 capsule (18 mcg total) into inhaler and inhale daily. 30 capsule 11  . Turmeric 500 MG CAPS Take 1 capsule by mouth daily.    Marland Kitchen VICTOZA 18 MG/3ML SOPN Inject 12 mg into the skin daily as needed (for blood sugar.).   0  . vitamin B-12 (CYANOCOBALAMIN) 1000 MCG tablet Take 2,000 mcg by mouth at bedtime.     Marland Kitchen atorvastatin (LIPITOR) 40 MG tablet TAKE 1 TABLET BY MOUTH EVERY DAY 30 tablet 0  . doxycycline (VIBRA-TABS) 100 MG tablet Take 1 tablet (100 mg total) by mouth 2 (two) times daily. 20 tablet 0  . losartan (COZAAR) 50 MG tablet TAKE 1 TABLET BY MOUTH TWICE DAILY 60 tablet 0  . predniSONE (DELTASONE) 5 MG tablet Take 1 tablet (5 mg total) by mouth daily with breakfast. 20 tablet 0   No facility-administered medications prior to visit.     Review of Systems  Constitutional: Negative for chills, fever, malaise/fatigue and weight loss.  HENT: Positive for postnasal drip. Negative for ear discharge, ear pain, rhinorrhea and sore throat.   Eyes: Negative for blurred vision.  Respiratory: Positive for cough, shortness of breath and wheezing. Negative for hemoptysis and sputum production.   Cardiovascular: Negative for chest pain, palpitations and leg swelling.  Gastrointestinal: Negative for abdominal pain, blood in stool, constipation, diarrhea, heartburn, melena and nausea.  Genitourinary: Negative for dysuria, frequency, hematuria and urgency.  Musculoskeletal: Negative for back pain, joint pain, myalgias and neck pain.  Skin: Negative for rash.  Neurological: Negative for dizziness, tingling, sensory change, focal weakness and headaches.  Endo/Heme/Allergies: Negative for environmental  allergies and polydipsia. Does not bruise/bleed easily.  Psychiatric/Behavioral: Negative for depression and suicidal ideas. The patient is not nervous/anxious and does not have insomnia.      Objective  Vitals:   03/03/17 1008  BP: 122/78  Pulse: 80  Resp: 16  Temp: 97.8 F (36.6 C)  SpO2: 96%  Weight: 133 lb (60.3 kg)  Height: 5\' 2"  (1.575 m)    Physical Exam  Constitutional: She is well-developed, well-nourished, and in no distress. No distress.  HENT:  Head: Normocephalic and atraumatic.  Right Ear: External ear normal.  Left Ear: External ear normal.  Nose: Nose normal.  Mouth/Throat: Oropharynx is clear and moist.  Eyes: Conjunctivae and EOM are normal. Pupils are equal, round, and reactive to light. Right eye exhibits no discharge. Left  eye exhibits no discharge.  Neck: Normal range of motion. Neck supple. No JVD present. No thyromegaly present.  Cardiovascular: Normal rate, regular rhythm, normal heart sounds and intact distal pulses.  Exam reveals no gallop and no friction rub.   No murmur heard. Pulmonary/Chest: Effort normal. She has wheezes. She has no rales.  Abdominal: Soft. Bowel sounds are normal. She exhibits no mass. There is no tenderness. There is no guarding.  Musculoskeletal: Normal range of motion. She exhibits no edema.  Lymphadenopathy:    She has no cervical adenopathy.  Neurological: She is alert. She has normal reflexes.  Skin: Skin is warm and dry. She is not diaphoretic.  Psychiatric: Mood and affect normal.  Nursing note and vitals reviewed.     Assessment & Plan  Problem List Items Addressed This Visit      Respiratory   Bronchitis - Primary   Centrilobular emphysema (HCC)   Relevant Medications   predniSONE (DELTASONE) 10 MG tablet   guaiFENesin-codeine (ROBITUSSIN AC) 100-10 MG/5ML syrup     Endocrine   Type 2 diabetes mellitus without complication, without long-term current use of insulin (HCC)      Meds ordered this  encounter  Medications  . levofloxacin (LEVAQUIN) 500 MG tablet    Sig: Take 1 tablet (500 mg total) by mouth daily.    Dispense:  7 tablet    Refill:  0  . predniSONE (DELTASONE) 10 MG tablet    Sig: Take 1 tablet (10 mg total) by mouth daily with breakfast.    Dispense:  30 tablet    Refill:  0  . guaiFENesin-codeine (ROBITUSSIN AC) 100-10 MG/5ML syrup    Sig: Take 5 mLs by mouth 3 (three) times daily as needed for cough.    Dispense:  150 mL    Refill:  0      Dr. Elizabeth Sauer [facility]  03/03/17

## 2017-03-05 ENCOUNTER — Other Ambulatory Visit: Payer: Self-pay | Admitting: Family Medicine

## 2017-03-11 DIAGNOSIS — J449 Chronic obstructive pulmonary disease, unspecified: Secondary | ICD-10-CM | POA: Diagnosis not present

## 2017-03-11 DIAGNOSIS — R0602 Shortness of breath: Secondary | ICD-10-CM | POA: Diagnosis not present

## 2017-03-15 ENCOUNTER — Encounter: Payer: Self-pay | Admitting: Family Medicine

## 2017-03-15 ENCOUNTER — Ambulatory Visit (INDEPENDENT_AMBULATORY_CARE_PROVIDER_SITE_OTHER): Payer: Medicare Other | Admitting: Family Medicine

## 2017-03-15 VITALS — BP 120/80 | HR 90 | Ht 62.0 in | Wt 133.0 lb

## 2017-03-15 DIAGNOSIS — J432 Centrilobular emphysema: Secondary | ICD-10-CM

## 2017-03-15 DIAGNOSIS — J40 Bronchitis, not specified as acute or chronic: Secondary | ICD-10-CM

## 2017-03-15 MED ORDER — DOXYCYCLINE HYCLATE 100 MG PO TABS: 100.0000 mg | ORAL_TABLET | Freq: Two times a day (BID) | ORAL | 0 refills | Status: DC

## 2017-03-15 MED ORDER — GUAIFENESIN-CODEINE 100-10 MG/5ML PO SYRP
5.0000 mL | ORAL_SOLUTION | Freq: Three times a day (TID) | ORAL | 0 refills | Status: DC | PRN
Start: 2017-03-15 — End: 2017-06-23

## 2017-03-15 MED ORDER — IPRATROPIUM-ALBUTEROL 0.5-2.5 (3) MG/3ML IN SOLN: 3.0000 mL | Freq: Once | RESPIRATORY_TRACT | Status: DC

## 2017-03-15 NOTE — Progress Notes (Signed)
Name: Judy Bolton   MRN: [id number]    DOB: [birth year]   Date:03/15/2017       Progress Note  Subjective  Chief Complaint  Chief Complaint  Patient presents with  . Bronchitis    cough and cong, wheezing, headache and body aches. Finished Levaquin and cough syrup on 03/10/17- Feels worse    Cough  This is a chronic problem. The current episode started more than 1 month ago. The problem has been waxing and waning. The cough is productive of purulent sputum. Associated symptoms include shortness of breath and wheezing. Pertinent negatives include no chest pain, chills, ear congestion, ear pain, fever, headaches, heartburn, hemoptysis, myalgias, nasal congestion, postnasal drip, rash, rhinorrhea, sore throat, sweats or weight loss. The symptoms are aggravated by pollens. She has tried a beta-agonist inhaler for the symptoms. The treatment provided moderate relief. Her past medical history is significant for asthma, COPD and emphysema. There is no history of environmental allergies.    No problem-specific Assessment & Plan notes found for this encounter.   Past Medical History:  Diagnosis Date  . Acid reflux   . Arthritis   . Asthma   . Back pain   . Back pain   . COPD (chronic obstructive pulmonary disease) (HCC)   . Diabetes mellitus without complication (HCC)   . Hyperlipidemia   . Hypertension     Past Surgical History:  Procedure Laterality Date  . ABDOMINAL HYSTERECTOMY    . APPENDECTOMY    . ROTATOR CUFF REPAIR Right     Family History  Problem Relation Age of Onset  . Heart attack Mother   . Cancer Father     Social History   Social History  . Marital status: Widowed    Spouse name: N/A  . Number of children: N/A  . Years of education: N/A   Occupational History  . Not on file.   Social History Main Topics  . Smoking status: Former Smoker    Quit date: 08/14/2000  . Smokeless tobacco: Never Used  . Alcohol use Yes  . Drug use: No  . Sexual activity:  Not Currently   Other Topics Concern  . Not on file   Social History Narrative  . No narrative on file    Allergies  Allergen Reactions  . Codeine Itching  . Iodine Swelling  . Latex     Other reaction(s): Other (See Comments)  . Penicillins Other (See Comments)    Paralysis Has patient had a PCN reaction causing immediate rash, facial/tongue/throat swelling, SOB or lightheadedness with hypotension: No Has patient had a PCN reaction causing severe rash involving mucus membranes or skin necrosis: No Has patient had a PCN reaction that required hospitalization Yes Has patient had a PCN reaction occurring within the last 10 years: No If all of the above answers are "NO", then may proceed with Cephalosporin use.    Outpatient Medications Prior to Visit  Medication Sig Dispense Refill  . albuterol (PROVENTIL HFA;VENTOLIN HFA) 108 (90 Base) MCG/ACT inhaler Inhale 2 puffs into the lungs every 6 (six) hours as needed for wheezing or shortness of breath.    Marland Kitchen albuterol (PROVENTIL) (2.5 MG/3ML) 0.083% nebulizer solution Take 3 mLs (2.5 mg total) by nebulization every 6 (six) hours as needed for wheezing or shortness of breath. 75 mL 12  . aspirin EC 325 MG tablet Take 1 tablet (325 mg total) by mouth daily. 30 tablet 0  . atorvastatin (LIPITOR) 40 MG tablet TAKE 1 TABLET  BY MOUTH EVERY DAY 90 tablet 0  . budesonide-formoterol (SYMBICORT) 160-4.5 MCG/ACT inhaler Inhale 2 puffs into the lungs 2 (two) times daily. 1 Inhaler 3  . cetirizine (ZYRTEC) 10 MG tablet Take 10 mg by mouth daily.  1  . gemfibrozil (LOPID) 600 MG tablet TAKE 1 TABLET BY MOUTH TWICE DAILY 180 tablet 0  . hydrochlorothiazide (HYDRODIURIL) 25 MG tablet TAKE 1 TABLET BY MOUTH EVERY DAY 30 tablet 0  . losartan (COZAAR) 50 MG tablet TAKE 1 TABLET BY MOUTH TWICE DAILY 180 tablet 0  . metFORMIN (GLUCOPHAGE) 1000 MG tablet Take 1,000 mg by mouth 2 (two) times daily with a meal.    . metoprolol tartrate (LOPRESSOR) 25 MG tablet  TAKE 1/2 TABLET BY MOUTH TWICE DAILY 60 tablet 0  . predniSONE (DELTASONE) 10 MG tablet Take 1 tablet (10 mg total) by mouth daily with breakfast. 30 tablet 0  . ranitidine (ZANTAC) 150 MG tablet Take 150 mg by mouth daily.    Marland Kitchen tiotropium (SPIRIVA) 18 MCG inhalation capsule Place 1 capsule (18 mcg total) into inhaler and inhale daily. 30 capsule 11  . Turmeric 500 MG CAPS Take 1 capsule by mouth daily.    Marland Kitchen VICTOZA 18 MG/3ML SOPN Inject 12 mg into the skin daily as needed (for blood sugar.).   0  . vitamin B-12 (CYANOCOBALAMIN) 1000 MCG tablet Take 2,000 mcg by mouth at bedtime.     . ALPRAZolam (XANAX) 0.25 MG tablet Take 1 tablet (0.25 mg total) by mouth 2 (two) times daily as needed for anxiety. (Patient not taking: Reported on 03/15/2017) 30 tablet 0  . guaiFENesin-codeine (ROBITUSSIN AC) 100-10 MG/5ML syrup Take 5 mLs by mouth 3 (three) times daily as needed for cough. 150 mL 0  . leflunomide (ARAVA) 20 MG tablet Take 1 tablet by mouth daily. Dr Renard Matter    . levofloxacin (LEVAQUIN) 500 MG tablet Take 1 tablet (500 mg total) by mouth daily. 7 tablet 0   No facility-administered medications prior to visit.     Review of Systems  Constitutional: Negative for chills, fever, malaise/fatigue and weight loss.  HENT: Negative for ear discharge, ear pain, postnasal drip, rhinorrhea and sore throat.   Eyes: Negative for blurred vision.  Respiratory: Positive for cough, shortness of breath and wheezing. Negative for hemoptysis and sputum production.   Cardiovascular: Negative for chest pain, palpitations and leg swelling.  Gastrointestinal: Negative for abdominal pain, blood in stool, constipation, diarrhea, heartburn, melena and nausea.  Genitourinary: Negative for dysuria, frequency, hematuria and urgency.  Musculoskeletal: Negative for back pain, joint pain, myalgias and neck pain.  Skin: Negative for rash.  Neurological: Negative for dizziness, tingling, sensory change, focal weakness and  headaches.  Endo/Heme/Allergies: Negative for environmental allergies and polydipsia. Does not bruise/bleed easily.  Psychiatric/Behavioral: Negative for depression and suicidal ideas. The patient is not nervous/anxious and does not have insomnia.      Objective  Vitals:   03/15/17 1121  BP: 120/80  Pulse: 90  SpO2: 93%  Weight: 133 lb (60.3 kg)  Height: 5\' 2"  (1.575 m)    Physical Exam  Constitutional: She is well-developed, well-nourished, and in no distress. No distress.  HENT:  Head: Normocephalic and atraumatic.  Right Ear: External ear normal.  Left Ear: External ear normal.  Nose: Nose normal.  Mouth/Throat: Oropharynx is clear and moist.  Eyes: Conjunctivae and EOM are normal. Pupils are equal, round, and reactive to light. Right eye exhibits no discharge. Left eye exhibits no discharge.  Neck:  Normal range of motion. Neck supple. No JVD present. No thyromegaly present.  Cardiovascular: Normal rate, regular rhythm, normal heart sounds and intact distal pulses.  Exam reveals no gallop and no friction rub.   No murmur heard. Pulmonary/Chest: Effort normal. No respiratory distress. She has wheezes. She has no rales.  Abdominal: Soft. Bowel sounds are normal. She exhibits no mass. There is no tenderness. There is no guarding.  Musculoskeletal: Normal range of motion. She exhibits no edema.  Lymphadenopathy:    She has no cervical adenopathy.  Neurological: She is alert. She has normal reflexes.  Skin: Skin is warm and dry. She is not diaphoretic.  Psychiatric: Mood and affect normal.  Nursing note and vitals reviewed.     Assessment & Plan  Problem List Items Addressed This Visit      Respiratory   Bronchitis - Primary   Relevant Medications   doxycycline (VIBRA-TABS) 100 MG tablet   guaiFENesin-codeine (ROBITUSSIN AC) 100-10 MG/5ML syrup   ipratropium-albuterol (DUONEB) 0.5-2.5 (3) MG/3ML nebulizer solution 3 mL   Centrilobular emphysema (HCC)   Relevant  Medications   guaiFENesin-codeine (ROBITUSSIN AC) 100-10 MG/5ML syrup   ipratropium-albuterol (DUONEB) 0.5-2.5 (3) MG/3ML nebulizer solution 3 mL      Meds ordered this encounter  Medications  . doxycycline (VIBRA-TABS) 100 MG tablet    Sig: Take 1 tablet (100 mg total) by mouth 2 (two) times daily.    Dispense:  20 tablet    Refill:  0  . guaiFENesin-codeine (ROBITUSSIN AC) 100-10 MG/5ML syrup    Sig: Take 5 mLs by mouth 3 (three) times daily as needed for cough.    Dispense:  150 mL    Refill:  0  . ipratropium-albuterol (DUONEB) 0.5-2.5 (3) MG/3ML nebulizer solution 3 mL      Dr. [facility]  03/15/17

## 2017-03-22 ENCOUNTER — Other Ambulatory Visit: Payer: Self-pay | Admitting: Family Medicine

## 2017-03-29 ENCOUNTER — Encounter (INDEPENDENT_AMBULATORY_CARE_PROVIDER_SITE_OTHER): Payer: Medicare Other

## 2017-03-29 ENCOUNTER — Ambulatory Visit (INDEPENDENT_AMBULATORY_CARE_PROVIDER_SITE_OTHER): Payer: Self-pay | Admitting: Vascular Surgery

## 2017-04-01 ENCOUNTER — Other Ambulatory Visit: Payer: Self-pay | Admitting: Family Medicine

## 2017-04-01 DIAGNOSIS — I10 Essential (primary) hypertension: Secondary | ICD-10-CM

## 2017-04-04 ENCOUNTER — Other Ambulatory Visit: Payer: Self-pay | Admitting: Family Medicine

## 2017-04-06 ENCOUNTER — Other Ambulatory Visit: Payer: Self-pay | Admitting: Family Medicine

## 2017-04-22 DIAGNOSIS — J4541 Moderate persistent asthma with (acute) exacerbation: Secondary | ICD-10-CM | POA: Diagnosis not present

## 2017-04-29 ENCOUNTER — Other Ambulatory Visit: Payer: Self-pay | Admitting: Family Medicine

## 2017-04-29 DIAGNOSIS — I10 Essential (primary) hypertension: Secondary | ICD-10-CM

## 2017-05-03 ENCOUNTER — Other Ambulatory Visit: Payer: Self-pay | Admitting: Family Medicine

## 2017-05-09 DIAGNOSIS — J441 Chronic obstructive pulmonary disease with (acute) exacerbation: Secondary | ICD-10-CM | POA: Diagnosis not present

## 2017-05-15 ENCOUNTER — Other Ambulatory Visit: Payer: Self-pay | Admitting: Family Medicine

## 2017-05-30 ENCOUNTER — Other Ambulatory Visit (INDEPENDENT_AMBULATORY_CARE_PROVIDER_SITE_OTHER): Payer: Self-pay | Admitting: Vascular Surgery

## 2017-05-30 ENCOUNTER — Other Ambulatory Visit: Payer: Self-pay | Admitting: Family Medicine

## 2017-05-30 DIAGNOSIS — I7102 Dissection of abdominal aorta: Secondary | ICD-10-CM

## 2017-05-30 DIAGNOSIS — I739 Peripheral vascular disease, unspecified: Secondary | ICD-10-CM

## 2017-05-30 DIAGNOSIS — I10 Essential (primary) hypertension: Secondary | ICD-10-CM

## 2017-05-31 ENCOUNTER — Encounter (INDEPENDENT_AMBULATORY_CARE_PROVIDER_SITE_OTHER): Payer: Self-pay | Admitting: Vascular Surgery

## 2017-05-31 ENCOUNTER — Ambulatory Visit (INDEPENDENT_AMBULATORY_CARE_PROVIDER_SITE_OTHER): Payer: Medicare Other | Admitting: Vascular Surgery

## 2017-05-31 ENCOUNTER — Ambulatory Visit (INDEPENDENT_AMBULATORY_CARE_PROVIDER_SITE_OTHER): Payer: Medicare Other

## 2017-05-31 VITALS — BP 180/79 | HR 62 | Resp 16 | Wt 140.0 lb

## 2017-05-31 DIAGNOSIS — I7102 Dissection of abdominal aorta: Secondary | ICD-10-CM | POA: Diagnosis not present

## 2017-05-31 DIAGNOSIS — I700 Atherosclerosis of aorta: Secondary | ICD-10-CM | POA: Diagnosis not present

## 2017-05-31 DIAGNOSIS — I739 Peripheral vascular disease, unspecified: Secondary | ICD-10-CM

## 2017-05-31 DIAGNOSIS — G459 Transient cerebral ischemic attack, unspecified: Secondary | ICD-10-CM

## 2017-05-31 DIAGNOSIS — E119 Type 2 diabetes mellitus without complications: Secondary | ICD-10-CM

## 2017-05-31 NOTE — Assessment & Plan Note (Signed)
Her noninvasive studies show some turbulence at the distal aorta and proximal common iliac arteries bilaterally without high-grade stenosis. Her ABIs and waveforms remained normal distally consistent with mild aortic atherosclerosis and peripheral arterial disease without severe arterial insufficiency at this point. No intervention currently be required for this mild disease. Plan to recheck in 2 years with noninvasive studies

## 2017-05-31 NOTE — Assessment & Plan Note (Signed)
Last year. No obvious residual deficits.

## 2017-05-31 NOTE — Assessment & Plan Note (Signed)
Her noninvasive studies show some turbulence at the distal aorta and proximal common iliac arteries bilaterally without high-grade stenosis. Her ABIs and waveforms remained normal distally consistent with mild aortic atherosclerosis and peripheral arterial disease without severe arterial insufficiency at this point. No intervention currently be required for this mild disease. Plan to recheck in 2 years with noninvasive studies 

## 2017-05-31 NOTE — Progress Notes (Signed)
MRN : [id number]  Judy Bolton is a 71 y.o. (1946/06/05) female who presents with chief complaint of  Chief Complaint  Patient presents with  . Follow-up  .  History of Present Illness: Patient returns today in follow up of Aortic atherosclerosis and peripheral arterial disease. She is doing well. She does say about a year ago she had a light stroke but does not have obvious residual deficits. She denies any current ischemic rest pain, tissue loss, or infection. Her noninvasive studies show some turbulence at the distal aorta and proximal common iliac arteries bilaterally without high-grade stenosis. Her ABIs and waveforms remained normal distally consistent with mild aortic atherosclerosis and peripheral arterial disease without severe arterial insufficiency at this point.  Current Outpatient Prescriptions  Medication Sig Dispense Refill  . albuterol (PROVENTIL HFA;VENTOLIN HFA) 108 (90 Base) MCG/ACT inhaler Inhale 2 puffs into the lungs every 6 (six) hours as needed for wheezing or shortness of breath.    Marland Kitchen albuterol (PROVENTIL) (2.5 MG/3ML) 0.083% nebulizer solution Take 3 mLs (2.5 mg total) by nebulization every 6 (six) hours as needed for wheezing or shortness of breath. 75 mL 12  . aspirin EC 325 MG tablet TAKE 1 TABLET(325 MG) BY MOUTH DAILY 30 tablet 0  . atorvastatin (LIPITOR) 40 MG tablet TAKE 1 TABLET BY MOUTH EVERY DAY 90 tablet 0  . cetirizine (ZYRTEC) 10 MG tablet Take 10 mg by mouth daily.  1  . gemfibrozil (LOPID) 600 MG tablet TAKE 1 TABLET BY MOUTH TWICE DAILY 180 tablet 0  . hydrochlorothiazide (HYDRODIURIL) 25 MG tablet TAKE 1 TABLET BY MOUTH EVERY DAY 90 tablet 0  . losartan (COZAAR) 50 MG tablet TAKE 1 TABLET BY MOUTH TWICE DAILY 60 tablet 0  . metFORMIN (GLUCOPHAGE) 1000 MG tablet Take 1,000 mg by mouth 2 (two) times daily with a meal.    . metoprolol tartrate (LOPRESSOR) 25 MG tablet TAKE 1/2 TABLET BY MOUTH TWICE DAILY 90 tablet 0  . predniSONE (DELTASONE) 10 MG  tablet Take 1 tablet (10 mg total) by mouth daily with breakfast. 30 tablet 0  . VICTOZA 18 MG/3ML SOPN Inject 12 mg into the skin daily as needed (for blood sugar.).   0  . vitamin B-12 (CYANOCOBALAMIN) 1000 MCG tablet Take 2,000 mcg by mouth at bedtime.     . ALPRAZolam (XANAX) 0.25 MG tablet Take 1 tablet (0.25 mg total) by mouth 2 (two) times daily as needed for anxiety. (Patient not taking: Reported on 03/15/2017) 30 tablet 0  . atorvastatin (LIPITOR) 40 MG tablet TAKE 1 TABLET BY MOUTH EVERY DAY (Patient not taking: Reported on 05/31/2017) 30 tablet 1  . budesonide-formoterol (SYMBICORT) 160-4.5 MCG/ACT inhaler Inhale 2 puffs into the lungs 2 (two) times daily. (Patient not taking: Reported on 05/31/2017) 1 Inhaler 3  . doxycycline (VIBRA-TABS) 100 MG tablet Take 1 tablet (100 mg total) by mouth 2 (two) times daily. (Patient not taking: Reported on 05/31/2017) 20 tablet 0  . guaiFENesin-codeine (ROBITUSSIN AC) 100-10 MG/5ML syrup Take 5 mLs by mouth 3 (three) times daily as needed for cough. (Patient not taking: Reported on 05/31/2017) 150 mL 0  . losartan (COZAAR) 50 MG tablet TAKE 1 TABLET BY MOUTH TWICE DAILY (Patient not taking: Reported on 05/31/2017) 180 tablet 0  . ranitidine (ZANTAC) 150 MG tablet Take 150 mg by mouth daily.    Marland Kitchen tiotropium (SPIRIVA) 18 MCG inhalation capsule Place 1 capsule (18 mcg total) into inhaler and inhale daily. (Patient not taking: Reported on  05/31/2017) 30 capsule 11  . Turmeric 500 MG CAPS Take 1 capsule by mouth daily.     Current Facility-Administered Medications  Medication Dose Route Frequency Provider Last Rate Last Dose  . ipratropium-albuterol (DUONEB) 0.5-2.5 (3) MG/3ML nebulizer solution 3 mL  3 mL Nebulization Once Duanne Limerick, MD        Past Medical History:  Diagnosis Date  . Acid reflux   . Arthritis   . Asthma   . Back pain   . Back pain   . COPD (chronic obstructive pulmonary disease) (HCC)   . Diabetes mellitus without complication  (HCC)   . Hyperlipidemia   . Hypertension     Past Surgical History:  Procedure Laterality Date  . ABDOMINAL HYSTERECTOMY    . APPENDECTOMY    . ROTATOR CUFF REPAIR Right     Social History Social History  Substance Use Topics  . Smoking status: Former Smoker    Quit date: 08/14/2000  . Smokeless tobacco: Never Used  . Alcohol use Yes     Family History Family History  Problem Relation Age of Onset  . Heart attack Mother   . Cancer Father     Allergies  Allergen Reactions  . Codeine Itching  . Iodine Swelling  . Latex     Other reaction(s): Other (See Comments)  . Penicillins Other (See Comments)    Paralysis Has patient had a PCN reaction causing immediate rash, facial/tongue/throat swelling, SOB or lightheadedness with hypotension: No Has patient had a PCN reaction causing severe rash involving mucus membranes or skin necrosis: No Has patient had a PCN reaction that required hospitalization Yes Has patient had a PCN reaction occurring within the last 10 years: No If all of the above answers are "NO", then may proceed with Cephalosporin use.     REVIEW OF SYSTEMS (Negative unless checked)  Constitutional: [] Weight loss  [] Fever  [] Chills Cardiac: [] Chest pain   [] Chest pressure   [] Palpitations   [] Shortness of breath when laying flat   [] Shortness of breath at rest   [x] Shortness of breath with exertion. Vascular:  [x] Pain in legs with walking   [] Pain in legs at rest   [] Pain in legs when laying flat   [] Claudication   [] Pain in feet when walking  [] Pain in feet at rest  [] Pain in feet when laying flat   [] History of DVT   [] Phlebitis   [] Swelling in legs   [] Varicose veins   [] Non-healing ulcers Pulmonary:   [] Uses home oxygen   [] Productive cough   [] Hemoptysis   [] Wheeze  [x] COPD   [] Asthma Neurologic:  [] Dizziness  [] Blackouts   [] Seizures   [x] History of stroke   [] History of TIA  [] Aphasia   [] Temporary blindness   [] Dysphagia   [] Weakness or numbness in arms    [] Weakness or numbness in legs Musculoskeletal:  [x] Arthritis   [] Joint swelling   [] Joint pain   [x] Low back pain Hematologic:  [] Easy bruising  [] Easy bleeding   [] Hypercoagulable state   [] Anemic   Gastrointestinal:  [] Blood in stool   [] Vomiting blood  [x] Gastroesophageal reflux/heartburn   [] Abdominal pain Genitourinary:  [] Chronic kidney disease   [] Difficult urination  [] Frequent urination  [] Burning with urination   [] Hematuria Skin:  [] Rashes   [] Ulcers   [] Wounds Psychological:  [] History of anxiety   []  History of major depression.  Physical Examination  BP (!) 180/79   Pulse 62   Resp 16   Wt 140 lb (63.5 kg)  BMI 25.61 kg/m  Gen:  WD/WN, NAD. Appears older than stated age Head: [place]/AT, No temporalis wasting. Ear/Nose/Throat: Hearing grossly intact, nares w/o erythema or drainage, trachea midline Eyes: Conjunctiva clear. Sclera non-icteric Neck: Supple.  No JVD.  Pulmonary:  Good air movement, no use of accessory muscles.  Cardiac: RRR, normal S1, S2 Vascular:  Vessel Right Left  Radial Palpable Palpable  Ulnar Palpable Palpable  Brachial Palpable Palpable  Carotid Palpable, without bruit Palpable, without bruit  Aorta Not palpable N/A  Femoral Palpable Palpable  Popliteal 1+ Palpable 1+ Palpable  PT 1+ Palpable 1+ Palpable  DP 1+ Palpable Palpable   Gastrointestinal: soft, non-tender/non-distended.  Musculoskeletal: M/S 5/5 throughout.  No deformity or atrophy.  Neurologic: Sensation grossly intact in extremities.  Symmetrical.  Speech is fluent.  Psychiatric: Judgment intact, Mood & affect appropriate for pt's clinical situation. Dermatologic: No rashes or ulcers noted.  No cellulitis or open wounds.       Labs No results found for this or any previous visit (from the past 2160 hour(s)).  Radiology No results found.   Assessment/Plan  Type 2 diabetes mellitus without complication, without long-term current use of insulin (HCC) blood glucose  control important in reducing the progression of atherosclerotic disease. Also, involved in wound healing. On appropriate medications.   TIA (transient ischemic attack) Last year. No obvious residual deficits.  Aortic atherosclerosis (HCC) Her noninvasive studies show some turbulence at the distal aorta and proximal common iliac arteries bilaterally without high-grade stenosis. Her ABIs and waveforms remained normal distally consistent with mild aortic atherosclerosis and peripheral arterial disease without severe arterial insufficiency at this point. No intervention currently be required for this mild disease. Plan to recheck in 2 years with noninvasive studies  PAD (peripheral artery disease) (HCC) Her noninvasive studies show some turbulence at the distal aorta and proximal common iliac arteries bilaterally without high-grade stenosis. Her ABIs and waveforms remained normal distally consistent with mild aortic atherosclerosis and peripheral arterial disease without severe arterial insufficiency at this point. No intervention currently be required for this mild disease. Plan to recheck in 2 years with noninvasive studies    Festus Barren, MD  05/31/2017 11:28 AM    This note was created with Dragon medical transcription system.  Any errors from dictation are purely unintentional

## 2017-05-31 NOTE — Assessment & Plan Note (Signed)
blood glucose control important in reducing the progression of atherosclerotic disease. Also, involved in wound healing. On appropriate medications.  

## 2017-06-02 ENCOUNTER — Other Ambulatory Visit: Payer: Self-pay | Admitting: Family Medicine

## 2017-06-07 ENCOUNTER — Encounter (INDEPENDENT_AMBULATORY_CARE_PROVIDER_SITE_OTHER): Payer: Medicare Other

## 2017-06-07 ENCOUNTER — Ambulatory Visit (INDEPENDENT_AMBULATORY_CARE_PROVIDER_SITE_OTHER): Payer: Self-pay | Admitting: Vascular Surgery

## 2017-06-13 DIAGNOSIS — R809 Proteinuria, unspecified: Secondary | ICD-10-CM | POA: Diagnosis not present

## 2017-06-13 DIAGNOSIS — E785 Hyperlipidemia, unspecified: Secondary | ICD-10-CM | POA: Diagnosis not present

## 2017-06-13 DIAGNOSIS — E875 Hyperkalemia: Secondary | ICD-10-CM | POA: Diagnosis not present

## 2017-06-13 DIAGNOSIS — E1129 Type 2 diabetes mellitus with other diabetic kidney complication: Secondary | ICD-10-CM | POA: Diagnosis not present

## 2017-06-13 DIAGNOSIS — E781 Pure hyperglyceridemia: Secondary | ICD-10-CM | POA: Diagnosis not present

## 2017-06-23 ENCOUNTER — Encounter: Payer: Self-pay | Admitting: Family Medicine

## 2017-06-23 ENCOUNTER — Other Ambulatory Visit: Payer: Self-pay | Admitting: Family Medicine

## 2017-06-23 ENCOUNTER — Ambulatory Visit (INDEPENDENT_AMBULATORY_CARE_PROVIDER_SITE_OTHER): Payer: Medicare Other | Admitting: Family Medicine

## 2017-06-23 VITALS — BP 130/80 | HR 72 | Ht 62.0 in | Wt 144.0 lb

## 2017-06-23 DIAGNOSIS — M545 Low back pain, unspecified: Secondary | ICD-10-CM

## 2017-06-23 DIAGNOSIS — N342 Other urethritis: Secondary | ICD-10-CM

## 2017-06-23 DIAGNOSIS — I700 Atherosclerosis of aorta: Secondary | ICD-10-CM | POA: Diagnosis not present

## 2017-06-23 LAB — POCT URINALYSIS DIPSTICK
BILIRUBIN UA: NEGATIVE
Glucose, UA: NEGATIVE
KETONES UA: NEGATIVE
Leukocytes, UA: NEGATIVE
Nitrite, UA: NEGATIVE
PH UA: 6 (ref 5.0–8.0)
Protein, UA: NEGATIVE
RBC UA: NEGATIVE
Spec Grav, UA: 1.01 (ref 1.010–1.025)
Urobilinogen, UA: 0.2 E.U./dL

## 2017-06-23 MED ORDER — CYCLOBENZAPRINE HCL 10 MG PO TABS: 10.0000 mg | ORAL_TABLET | Freq: Three times a day (TID) | ORAL | 0 refills | Status: DC | PRN

## 2017-06-23 MED ORDER — MELOXICAM 15 MG PO TABS: 15.0000 mg | ORAL_TABLET | Freq: Every day | ORAL | 0 refills | Status: DC

## 2017-06-23 MED ORDER — CIPROFLOXACIN HCL 250 MG PO TABS: 250.0000 mg | ORAL_TABLET | Freq: Two times a day (BID) | ORAL | 0 refills | Status: DC

## 2017-06-23 NOTE — Patient Instructions (Signed)
Herniated Disk A herniated disk, also called a ruptured disk or slipped disk, occurs when a disk in the spine bulges out too far. Between the bones in the spine (vertebrae), there are oval disks that are made of a soft, spongy center that is surrounded by a tough outer ring. The disks connect your vertebrae, help your spine move, and absorb shocks from your movement. When you have a herniated disk, the spongy center of the disk bulges out or breaks through the outer ring. It can press on a nerve between the vertebrae and cause pain. This can occur anywhere in the back or neck area, but the lower back is most commonly affected. What are the causes? This condition may be caused by:  Age-related wear and tear. The spongy centers of spinal disks tend to shrink and dry out with age, which makes them more likely to herniate.  Sudden injury, such as a strain or sprain.  What increases the risk? Aging is the main risk factor for a herniated disk. Other risk factors include:  Being a man who is 30-50 years old.  Frequently doing activities that involve heavy lifting, bending, or twisting.  Frequently driving for long hours at a time.  Not getting enough exercise.  Being overweight.  Smoking.  Having a family history of back problems or herniated disks.  Being pregnant or giving birth.  Having poor nutrition.  Being tall.  What are the signs or symptoms? Symptoms may vary depending on where your herniated disk is located.  A herniated disk in the lower back may cause sharp pain in: ? Part of the arm, leg, hip, or buttocks. ? The back of the lower leg (calf). ? The lower back, spreading down through the leg into the foot (sciatica).  A herniated disk in the neck may cause dizziness and vertigo. It may also cause pain or weakness in: ? The neck. ? The shoulder blades. ? Upper arm, forearm, or fingers.  You may also have muscle weakness. It may be difficult to: ? Lift your leg or  arm. ? Stand on your toes. ? Squeeze tightly with one of your hands.  Other symptoms may include: ? Numbness or tingling in the affected areas of the hands, arms, feet, or legs. ? Inability to control when you urinate or when you have bowel movements. This is a rare but serious sign of a severe herniated disk in the lower back.  How is this diagnosed? This condition may be diagnosed based on:  Your symptoms.  Your medical history.  A physical exam. The exam may include: ? Straight-leg test. You will lie on your back while your health care provider lifts your leg, keeping your knee straight. If you feel pain, you likely have a herniated disk. ? Neurological tests. This includes checking for numbness, reflexes, muscle strength, and posture.  Imaging tests, such as: ? X-rays. ? MRI. ? CT scan. ? Electromyogram (EMG) to check the nerves that control muscles. This test may be used to determine which nerves are affected by your herniated disk.  How is this treated? Treatment for this condition may include:  A short period of rest. This is usually the first treatment. ? You may be on bed rest for up to 2 days, or you may be instructed to stay home and avoid physical activity. ? If you have a herniated disk in your lower back, avoid sitting as much as possible. Sitting increases pressure on the disk.  Medicines. These may   include: ? NSAIDs to help reduce pain and swelling. ? Muscle relaxants to prevent sudden tightening of the back muscles (back spasms). ? Prescription pain medicines, if you have severe pain.  Steroid injections in the area of the herniated disk. This can help reduce pain and swelling.  Physical therapy to strengthen your back muscles.  In many cases, symptoms go away with treatment over a period of days or weeks. You will most likely be free of symptoms after 3-4 months. If other treatments do not help to relieve your symptoms, you may need surgery. Follow these  instructions at home: Medicines  Take over-the-counter and prescription medicines only as told by your health care provider.  Do not drive or use heavy machinery while taking prescription pain medicine. Activity  Rest as directed.  After your rest period: ? Return to your normal activities and gradually begin exercising as told by your health care provider. Ask your health care provider what activities and exercises are safe for you. ? Use good posture. ? Avoid movements that cause pain. ? Do not lift anything that is heavier than 10 lb (4.5 kg) until your health care provider says this is safe. ? Do not sit or stand for long periods of time without changing positions. ? Do not sit for long periods of time without getting up and moving around.  If physical therapy was prescribed, do exercises as instructed.  Aim to strengthen muscles in your back and abdomen with exercises like crunches, swimming, or walking. General instructions  Do not use any products that contain nicotine or tobacco, such as cigarettes and e-cigarettes. These products can delay healing. If you need help quitting, ask your health care provider.  Do not wear high-heeled shoes.  Do not sleep on your belly.  If you are overweight, work with your health care provider to lose weight safely.  To prevent or treat constipation while you are taking prescription pain medicine, your health care provider may recommend that you: ? Drink enough fluid to keep your urine clear or pale yellow. ? Take over-the-counter or prescription medicines. ? Eat foods that are high in fiber, such as fresh fruits and vegetables, whole grains, and beans. ? Limit foods that are high in fat and processed sugars, such as fried and sweet foods.  Keep all follow-up visits as told by your health care provider. This is important. How is this prevented?  Maintain a healthy weight.  Try to avoid stressful situations.  Maintain physical  fitness. Do at least 150 minutes of moderate-intensity exercise each week, such as brisk walking or water aerobics.  When lifting objects: ? Keep your feet at least shoulder-width apart and tighten your abdominal muscles. ? Keep your spine neutral as you bend your knees and hips. It is important to lift using the strength of your legs, not your back. Do not lock your knees straight out. ? Always ask for help to lift heavy or awkward objects. Contact a health care provider if:  You have back pain or neck pain that does not get better after 6 weeks.  You have severe pain in your back, neck, legs, or arms.  You develop numbness, tingling, or weakness in any part of your body. Get help right away if:  You cannot move your arms or legs.  You cannot control when you urinate or have bowel movements.  You feel dizzy or you faint.  You have shortness of breath. This information is not intended to replace advice given   to you by your health care provider. Make sure you discuss any questions you have with your health care provider. Document Released: 11/19/2000 Document Revised: 07/19/2016 Document Reviewed: 07/19/2016 [employer] Interactive Patient Education  2017 [employer]  

## 2017-06-23 NOTE — Progress Notes (Signed)
Name: Judy Bolton   MRN: [id number]    DOB: 05/01/1946   Date:06/23/2017       Progress Note  Subjective  Chief Complaint  Chief Complaint  Patient presents with  . Urinary Tract Infection    lower back pain and burning when urinating- had cipro left from last Rx- took 1 last night and 1 this am    Urinary Tract Infection   This is a recurrent problem. The current episode started yesterday. The problem occurs intermittently. The problem has been gradually improving (took 2 cipro/ picking peas). The quality of the pain is described as burning. The pain is at a severity of 3/10. The pain is moderate. The maximum temperature recorded prior to her arrival was 100 - 100.9 F. Associated symptoms include frequency and urgency. Pertinent negatives include no chills, discharge, hematuria, hesitancy or nausea. She has tried antibiotics for the symptoms. The treatment provided mild relief. Her past medical history is significant for recurrent UTIs.  Back Pain  This is a new problem. The current episode started in the past 7 days. The problem occurs constantly. The problem has been gradually worsening since onset. The pain is present in the lumbar spine. The pain is at a severity of 4/10. The pain is moderate. Pertinent negatives include no abdominal pain, chest pain, dysuria, fever, headaches, leg pain, numbness, tingling or weight loss.    No problem-specific Assessment & Plan notes found for this encounter.   Past Medical History:  Diagnosis Date  . Acid reflux   . Arthritis   . Asthma   . Back pain   . Back pain   . COPD (chronic obstructive pulmonary disease) (HCC)   . Diabetes mellitus without complication (HCC)   . Hyperlipidemia   . Hypertension     Past Surgical History:  Procedure Laterality Date  . ABDOMINAL HYSTERECTOMY    . APPENDECTOMY    . ROTATOR CUFF REPAIR Right     Family History  Problem Relation Age of Onset  . Heart attack Mother   . Cancer Father      Social History   Social History  . Marital status: Widowed    Spouse name: N/A  . Number of children: N/A  . Years of education: N/A   Occupational History  . Not on file.   Social History Main Topics  . Smoking status: Former Smoker    Quit date: 08/14/2000  . Smokeless tobacco: Never Used  . Alcohol use Yes  . Drug use: No  . Sexual activity: Not Currently   Other Topics Concern  . Not on file   Social History Narrative  . No narrative on file    Allergies  Allergen Reactions  . Codeine Itching  . Iodine Swelling  . Latex     Other reaction(s): Other (See Comments)  . Penicillins Other (See Comments)    Paralysis Has patient had a PCN reaction causing immediate rash, facial/tongue/throat swelling, SOB or lightheadedness with hypotension: No Has patient had a PCN reaction causing severe rash involving mucus membranes or skin necrosis: No Has patient had a PCN reaction that required hospitalization Yes Has patient had a PCN reaction occurring within the last 10 years: No If all of the above answers are "NO", then may proceed with Cephalosporin use.    Outpatient Medications Prior to Visit  Medication Sig Dispense Refill  . albuterol (PROVENTIL HFA;VENTOLIN HFA) 108 (90 Base) MCG/ACT inhaler Inhale 2 puffs into the lungs every 6 (six) hours  as needed for wheezing or shortness of breath.    Marland Kitchen albuterol (PROVENTIL) (2.5 MG/3ML) 0.083% nebulizer solution Take 3 mLs (2.5 mg total) by nebulization every 6 (six) hours as needed for wheezing or shortness of breath. 75 mL 12  . aspirin EC 325 MG tablet TAKE 1 TABLET(325 MG) BY MOUTH DAILY 30 tablet 0  . atorvastatin (LIPITOR) 40 MG tablet TAKE 1 TABLET BY MOUTH EVERY DAY 90 tablet 0  . budesonide-formoterol (SYMBICORT) 160-4.5 MCG/ACT inhaler Inhale 2 puffs into the lungs 2 (two) times daily. 1 Inhaler 3  . cetirizine (ZYRTEC) 10 MG tablet Take 10 mg by mouth daily.  1  . gemfibrozil (LOPID) 600 MG tablet TAKE 1 TABLET BY  MOUTH TWICE DAILY 180 tablet 0  . hydrochlorothiazide (HYDRODIURIL) 25 MG tablet TAKE 1 TABLET BY MOUTH EVERY DAY 90 tablet 0  . losartan (COZAAR) 50 MG tablet TAKE 1 TABLET BY MOUTH TWICE DAILY 180 tablet 0  . metFORMIN (GLUCOPHAGE) 1000 MG tablet Take 1,000 mg by mouth 2 (two) times daily with a meal.    . metoprolol tartrate (LOPRESSOR) 25 MG tablet TAKE 1/2 TABLET BY MOUTH TWICE DAILY 90 tablet 0  . predniSONE (DELTASONE) 10 MG tablet Take 1 tablet (10 mg total) by mouth daily with breakfast. (Patient taking differently: Take 5 mg by mouth 2 (two) times daily with a meal. ) 30 tablet 0  . ranitidine (ZANTAC) 150 MG tablet Take 150 mg by mouth daily.    Marland Kitchen tiotropium (SPIRIVA) 18 MCG inhalation capsule Place 1 capsule (18 mcg total) into inhaler and inhale daily. 30 capsule 11  . Turmeric 500 MG CAPS Take 1 capsule by mouth daily.    . vitamin B-12 (CYANOCOBALAMIN) 1000 MCG tablet Take 2,000 mcg by mouth at bedtime.     . ALPRAZolam (XANAX) 0.25 MG tablet Take 1 tablet (0.25 mg total) by mouth 2 (two) times daily as needed for anxiety. (Patient not taking: Reported on 03/15/2017) 30 tablet 0  . atorvastatin (LIPITOR) 40 MG tablet TAKE 1 TABLET BY MOUTH EVERY DAY (Patient not taking: Reported on 05/31/2017) 30 tablet 1  . doxycycline (VIBRA-TABS) 100 MG tablet Take 1 tablet (100 mg total) by mouth 2 (two) times daily. (Patient not taking: Reported on 05/31/2017) 20 tablet 0  . guaiFENesin-codeine (ROBITUSSIN AC) 100-10 MG/5ML syrup Take 5 mLs by mouth 3 (three) times daily as needed for cough. (Patient not taking: Reported on 05/31/2017) 150 mL 0  . losartan (COZAAR) 50 MG tablet TAKE 1 TABLET BY MOUTH TWICE DAILY 60 tablet 0  . VICTOZA 18 MG/3ML SOPN Inject 12 mg into the skin daily as needed (for blood sugar.).   0   Facility-Administered Medications Prior to Visit  Medication Dose Route Frequency Provider Last Rate Last Dose  . ipratropium-albuterol (DUONEB) 0.5-2.5 (3) MG/3ML nebulizer solution 3  mL  3 mL Nebulization Once Duanne Limerick, MD        Review of Systems  Constitutional: Negative for chills, fever, malaise/fatigue and weight loss.  HENT: Negative for ear discharge, ear pain and sore throat.   Eyes: Negative for blurred vision.  Respiratory: Negative for cough, sputum production, shortness of breath and wheezing.   Cardiovascular: Negative for chest pain, palpitations and leg swelling.  Gastrointestinal: Negative for abdominal pain, blood in stool, constipation, diarrhea, heartburn, melena and nausea.  Genitourinary: Positive for frequency and urgency. Negative for dysuria, hematuria and hesitancy.  Musculoskeletal: Positive for back pain. Negative for joint pain, myalgias and neck pain.  Skin: Negative for rash.  Neurological: Negative for dizziness, tingling, sensory change, focal weakness, numbness and headaches.  Endo/Heme/Allergies: Negative for environmental allergies and polydipsia. Does not bruise/bleed easily.  Psychiatric/Behavioral: Negative for depression and suicidal ideas. The patient is not nervous/anxious and does not have insomnia.      Objective  Vitals:   06/23/17 1505  BP: 130/80  Pulse: 72  Weight: 144 lb (65.3 kg)  Height: 5\' 2"  (1.575 m)    Physical Exam  Constitutional: She is well-developed, well-nourished, and in no distress. No distress.  HENT:  Head: Normocephalic and atraumatic.  Right Ear: External ear normal.  Left Ear: External ear normal.  Nose: Nose normal.  Mouth/Throat: Oropharynx is clear and moist.  Eyes: Pupils are equal, round, and reactive to light. Conjunctivae and EOM are normal. Right eye exhibits no discharge. Left eye exhibits no discharge.  Neck: Normal range of motion. Neck supple. No JVD present. No thyromegaly present.  Cardiovascular: Normal rate, regular rhythm, normal heart sounds and intact distal pulses.  Exam reveals no gallop and no friction rub.   No murmur heard. Pulmonary/Chest: Effort normal and  breath sounds normal. She has no wheezes. She has no rales.  Abdominal: Soft. Bowel sounds are normal. She exhibits no mass. There is no tenderness. There is no guarding.  Musculoskeletal: Normal range of motion. She exhibits no edema.       Lumbar back: She exhibits spasm.  Lymphadenopathy:    She has no cervical adenopathy.  Neurological: She is alert. She has normal strength and normal reflexes. A sensory deficit is present. She has a normal Straight Leg Raise Test.  Decreased sensory left leg  Skin: Skin is warm and dry. She is not diaphoretic.  Psychiatric: Mood and affect normal.  Nursing note and vitals reviewed.     Assessment & Plan  Problem List Items Addressed This Visit      Cardiovascular and Mediastinum   Aortic atherosclerosis (HCC)    Other Visit Diagnoses    Acute midline low back pain without sciatica    -  Primary   Relevant Medications   cyclobenzaprine (FLEXERIL) 10 MG tablet   meloxicam (MOBIC) 15 MG tablet   Other Relevant Orders   POCT urinalysis dipstick (Completed)   Urethritis       Relevant Medications   ciprofloxacin (CIPRO) 250 MG tablet      Meds ordered this encounter  Medications  . DISCONTD: meloxicam (MOBIC) 15 MG tablet    Sig: Take 1 tablet (15 mg total) by mouth daily.    Dispense:  30 tablet    Refill:  0  . DISCONTD: cyclobenzaprine (FLEXERIL) 10 MG tablet    Sig: Take 1 tablet (10 mg total) by mouth 3 (three) times daily as needed for muscle spasms.    Dispense:  30 tablet    Refill:  0  . DISCONTD: ciprofloxacin (CIPRO) 250 MG tablet    Sig: Take 1 tablet (250 mg total) by mouth 2 (two) times daily.    Dispense:  12 tablet    Refill:  0  . ciprofloxacin (CIPRO) 250 MG tablet    Sig: Take 1 tablet (250 mg total) by mouth 2 (two) times daily.    Dispense:  12 tablet    Refill:  0  . cyclobenzaprine (FLEXERIL) 10 MG tablet    Sig: Take 1 tablet (10 mg total) by mouth 3 (three) times daily as needed for muscle spasms.     Dispense:  30  tablet    Refill:  0  . meloxicam (MOBIC) 15 MG tablet    Sig: Take 1 tablet (15 mg total) by mouth daily.    Dispense:  30 tablet    Refill:  0      Dr. Hayden [facility]  06/23/17

## 2017-06-27 ENCOUNTER — Other Ambulatory Visit: Payer: Self-pay

## 2017-06-27 ENCOUNTER — Telehealth: Payer: Self-pay

## 2017-06-27 ENCOUNTER — Other Ambulatory Visit: Payer: Self-pay | Admitting: Family Medicine

## 2017-06-27 DIAGNOSIS — I10 Essential (primary) hypertension: Secondary | ICD-10-CM

## 2017-06-27 NOTE — Telephone Encounter (Signed)
t called- had swelling on Meloxicam after 2 days of taking. Was told not to take it anymore and she would have to stick to Tylenol and the Flexeril. If not getting better- can send back to [facility]. Pt said she would "let you know if she needs to do that."

## 2017-06-29 ENCOUNTER — Other Ambulatory Visit: Payer: Self-pay | Admitting: Family Medicine

## 2017-06-30 ENCOUNTER — Other Ambulatory Visit: Payer: Self-pay | Admitting: Student

## 2017-06-30 DIAGNOSIS — M545 Low back pain: Secondary | ICD-10-CM

## 2017-06-30 DIAGNOSIS — G8929 Other chronic pain: Secondary | ICD-10-CM | POA: Diagnosis not present

## 2017-06-30 DIAGNOSIS — M48062 Spinal stenosis, lumbar region with neurogenic claudication: Secondary | ICD-10-CM | POA: Diagnosis not present

## 2017-07-04 ENCOUNTER — Ambulatory Visit
Admission: RE | Admit: 2017-07-04 | Discharge: 2017-07-04 | Disposition: A | Discharge status: Home / Self Care | Payer: Medicare Other | Source: Ambulatory Visit | Attending: Student | Admitting: Student

## 2017-07-04 DIAGNOSIS — M48061 Spinal stenosis, lumbar region without neurogenic claudication: Secondary | ICD-10-CM | POA: Diagnosis not present

## 2017-07-04 DIAGNOSIS — M4316 Spondylolisthesis, lumbar region: Secondary | ICD-10-CM | POA: Diagnosis not present

## 2017-07-04 DIAGNOSIS — M545 Low back pain: Secondary | ICD-10-CM | POA: Diagnosis present

## 2017-07-04 DIAGNOSIS — G8929 Other chronic pain: Secondary | ICD-10-CM | POA: Diagnosis present

## 2017-07-04 DIAGNOSIS — M25561 Pain in right knee: Secondary | ICD-10-CM | POA: Diagnosis not present

## 2017-07-04 DIAGNOSIS — M179 Osteoarthritis of knee, unspecified: Secondary | ICD-10-CM | POA: Diagnosis not present

## 2017-07-04 DIAGNOSIS — M25461 Effusion, right knee: Secondary | ICD-10-CM | POA: Diagnosis not present

## 2017-07-04 DIAGNOSIS — M5126 Other intervertebral disc displacement, lumbar region: Secondary | ICD-10-CM | POA: Diagnosis not present

## 2017-07-04 DIAGNOSIS — M48062 Spinal stenosis, lumbar region with neurogenic claudication: Secondary | ICD-10-CM | POA: Diagnosis present

## 2017-07-11 ENCOUNTER — Other Ambulatory Visit: Payer: Self-pay | Admitting: Family Medicine

## 2017-07-11 DIAGNOSIS — Z1231 Encounter for screening mammogram for malignant neoplasm of breast: Secondary | ICD-10-CM

## 2017-07-14 DIAGNOSIS — M47816 Spondylosis without myelopathy or radiculopathy, lumbar region: Secondary | ICD-10-CM | POA: Diagnosis not present

## 2017-07-14 DIAGNOSIS — M48062 Spinal stenosis, lumbar region with neurogenic claudication: Secondary | ICD-10-CM | POA: Diagnosis not present

## 2017-07-21 ENCOUNTER — Other Ambulatory Visit: Payer: Self-pay

## 2017-07-21 ENCOUNTER — Encounter
Admission: RE | Admit: 2017-07-21 | Discharge: 2017-07-21 | Disposition: A | Discharge status: Home / Self Care | Payer: Medicare Other | Source: Ambulatory Visit | Attending: Neurosurgery | Admitting: Neurosurgery

## 2017-07-21 DIAGNOSIS — J40 Bronchitis, not specified as acute or chronic: Secondary | ICD-10-CM | POA: Diagnosis not present

## 2017-07-21 DIAGNOSIS — F419 Anxiety disorder, unspecified: Secondary | ICD-10-CM | POA: Insufficient documentation

## 2017-07-21 DIAGNOSIS — I10 Essential (primary) hypertension: Secondary | ICD-10-CM | POA: Insufficient documentation

## 2017-07-21 DIAGNOSIS — J432 Centrilobular emphysema: Secondary | ICD-10-CM | POA: Diagnosis not present

## 2017-07-21 DIAGNOSIS — Z8673 Personal history of transient ischemic attack (TIA), and cerebral infarction without residual deficits: Secondary | ICD-10-CM | POA: Diagnosis not present

## 2017-07-21 DIAGNOSIS — Z01812 Encounter for preprocedural laboratory examination: Secondary | ICD-10-CM | POA: Diagnosis not present

## 2017-07-21 DIAGNOSIS — E119 Type 2 diabetes mellitus without complications: Secondary | ICD-10-CM | POA: Insufficient documentation

## 2017-07-21 DIAGNOSIS — I739 Peripheral vascular disease, unspecified: Secondary | ICD-10-CM | POA: Insufficient documentation

## 2017-07-21 DIAGNOSIS — Z01810 Encounter for preprocedural cardiovascular examination: Secondary | ICD-10-CM | POA: Insufficient documentation

## 2017-07-21 DIAGNOSIS — I700 Atherosclerosis of aorta: Secondary | ICD-10-CM | POA: Diagnosis not present

## 2017-07-21 DIAGNOSIS — K219 Gastro-esophageal reflux disease without esophagitis: Secondary | ICD-10-CM | POA: Insufficient documentation

## 2017-07-21 DIAGNOSIS — J449 Chronic obstructive pulmonary disease, unspecified: Secondary | ICD-10-CM | POA: Insufficient documentation

## 2017-07-21 HISTORY — DX: Cerebral infarction, unspecified: I63.9

## 2017-07-21 LAB — APTT: aPTT: 30 seconds (ref 24–36)

## 2017-07-21 LAB — SURGICAL PCR SCREEN
MRSA, PCR: NEGATIVE
Staphylococcus aureus: NEGATIVE

## 2017-07-21 LAB — URINALYSIS, COMPLETE (UACMP) WITH MICROSCOPIC
Bilirubin Urine: NEGATIVE
Glucose, UA: NEGATIVE mg/dL
Hgb urine dipstick: NEGATIVE
Ketones, ur: NEGATIVE mg/dL
Nitrite: NEGATIVE
PROTEIN: NEGATIVE mg/dL
Specific Gravity, Urine: 1.011 (ref 1.005–1.030)
pH: 5 (ref 5.0–8.0)

## 2017-07-21 LAB — CBC
HEMATOCRIT: 38.2 % (ref 35.0–47.0)
HEMOGLOBIN: 12.8 g/dL (ref 12.0–16.0)
MCH: 28.2 pg (ref 26.0–34.0)
MCHC: 33.6 g/dL (ref 32.0–36.0)
MCV: 84.1 fL (ref 80.0–100.0)
Platelets: 271 10*3/uL (ref 150–440)
RBC: 4.54 MIL/uL (ref 3.80–5.20)
RDW: 14.5 % (ref 11.5–14.5)
WBC: 11.5 10*3/uL — AB (ref 3.6–11.0)

## 2017-07-21 LAB — BASIC METABOLIC PANEL
ANION GAP: 10 (ref 5–15)
BUN: 29 mg/dL — ABNORMAL HIGH (ref 6–20)
CALCIUM: 9.9 mg/dL (ref 8.9–10.3)
CHLORIDE: 105 mmol/L (ref 101–111)
CO2: 26 mmol/L (ref 22–32)
Creatinine, Ser: 1.21 mg/dL — ABNORMAL HIGH (ref 0.44–1.00)
GFR calc non Af Amer: 45 mL/min — ABNORMAL LOW (ref >60)
GFR, EST AFRICAN AMERICAN: 52 mL/min — AB (ref >60)
Glucose, Bld: 142 mg/dL — ABNORMAL HIGH (ref 65–99)
Potassium: 4.1 mmol/L (ref 3.5–5.1)
Sodium: 141 mmol/L (ref 135–145)

## 2017-07-21 LAB — PROTIME-INR
INR: 0.93
Prothrombin Time: 12.5 seconds (ref 11.4–15.2)

## 2017-07-21 LAB — TYPE AND SCREEN
ABO/RH(D): O POS
ANTIBODY SCREEN: NEGATIVE

## 2017-07-21 NOTE — Pre-Procedure Instructions (Signed)
EKG COMPARED WITH 3/17 EKG

## 2017-07-21 NOTE — Patient Instructions (Addendum)
Your procedure is scheduled on: 07/27/17 Wed Report to Same Day Surgery 2nd floor medical mall [facility] Entrance-take elevator on left to 2nd floor.  Check in with surgery information desk.) To find out your arrival time please call [phone] between 1PM - 3PM on 07/26/17 Tues  Remember: Instructions that are not followed completely may result in serious medical risk, up to and including death, or upon the discretion of your surgeon and anesthesiologist your surgery may need to be rescheduled.    _x___ 1. Do not eat food or drink liquids after midnight. No gum chewing or                              hard candies.     __x__ 2. No Alcohol for 24 hours before or after surgery.   __x__3. No Smoking for 24 prior to surgery.   ____  4. Bring all medications with you on the day of surgery if instructed.    __x__ 5. Notify your doctor if there is any change in your medical condition     (cold, fever, infections).     Do not wear jewelry, make-up, hairpins, clips or nail polish.  Do not wear lotions, powders, or perfumes. You may wear deodorant.  Do not shave 48 hours prior to surgery. Men may shave face and neck.  Do not bring valuables to the hospital.    [facility] is not responsible for any belongings or valuables.               Contacts, dentures or bridgework may not be worn into surgery.  Leave your suitcase in the car. After surgery it may be brought to your room.  For patients admitted to the hospital, discharge time is determined by your                       treatment team.   Patients discharged the day of surgery will not be allowed to drive home.  You will need someone to drive you home and stay with you the night of your procedure.    Please read over the following fact sheets that you were given:   [facility] Preparing for Surgery and or MRSA Information   _x___ Take anti-hypertensive (unless it includes a diuretic), cardiac, seizure, asthma,     anti-reflux and  psychiatric medicines. These include:  1. albuterol (PROVENTIL   2.gemfibrozil (LOPID)  3.metoprolol tartrate   4.ranitidine (ZANTAC) the night before and the day of surgery  5.tiotropium (SPIRIVA  6.  ____Fleets enema or Magnesium Citrate as directed.   _x___ Use CHG Soap or sage wipes as directed on instruction sheet   __x__ Use inhalers on the day of surgery and bring to hospital day of surgery  _x___ Stop Metformin and Janumet 2 days prior to surgery.    ____ Take 1/2 of usual insulin dose the night before surgery and none on the morning     surgery.   _x___ Follow recommendations from Cardiologist, Pulmonologist or PCP regarding          stopping Aspirin, Coumadin, Pllavix ,Eliquis, Effient, or Pradaxa, and Pletal. Stop Aspirin 1 week before surgery if OK with physician  X____Stop Anti-inflammatories such as Advil, Aleve, Ibuprofen, Motrin, Naproxen, Naprosyn, Goodies powders or aspirin products.  Stop Lodine 1 week before surgery.   OK to take Tylenol and  Celebrex.   _x___ Stop supplements until after surgery.  But may continue Vitamin D, Vitamin B,       and multivitamin.   ____ Bring C-Pap to the hospital.

## 2017-07-22 ENCOUNTER — Other Ambulatory Visit: Payer: Self-pay | Admitting: Family Medicine

## 2017-07-25 ENCOUNTER — Other Ambulatory Visit: Payer: Self-pay | Admitting: Family Medicine

## 2017-07-25 DIAGNOSIS — I10 Essential (primary) hypertension: Secondary | ICD-10-CM

## 2017-07-27 ENCOUNTER — Encounter: Payer: Self-pay | Admitting: Anesthesiology

## 2017-07-27 ENCOUNTER — Ambulatory Visit: Payer: Medicare Other

## 2017-07-27 ENCOUNTER — Ambulatory Visit: Payer: Medicare Other | Admitting: Certified Registered"

## 2017-07-27 ENCOUNTER — Observation Stay
Admission: RE | Admit: 2017-07-27 | Discharge: 2017-07-28 | Disposition: A | Discharge status: Home / Self Care | Payer: Medicare Other | Source: Ambulatory Visit | Attending: Neurosurgery | Admitting: Neurosurgery

## 2017-07-27 ENCOUNTER — Encounter
Admission: RE | Disposition: A | Discharge status: Home / Self Care | Payer: Self-pay | Source: Ambulatory Visit | Attending: Neurosurgery

## 2017-07-27 DIAGNOSIS — M1990 Unspecified osteoarthritis, unspecified site: Secondary | ICD-10-CM | POA: Diagnosis not present

## 2017-07-27 DIAGNOSIS — M4326 Fusion of spine, lumbar region: Secondary | ICD-10-CM | POA: Diagnosis not present

## 2017-07-27 DIAGNOSIS — Z87891 Personal history of nicotine dependence: Secondary | ICD-10-CM | POA: Diagnosis not present

## 2017-07-27 DIAGNOSIS — Z880 Allergy status to penicillin: Secondary | ICD-10-CM | POA: Diagnosis not present

## 2017-07-27 DIAGNOSIS — Z885 Allergy status to narcotic agent status: Secondary | ICD-10-CM | POA: Diagnosis not present

## 2017-07-27 DIAGNOSIS — Z7982 Long term (current) use of aspirin: Secondary | ICD-10-CM | POA: Diagnosis not present

## 2017-07-27 DIAGNOSIS — M48062 Spinal stenosis, lumbar region with neurogenic claudication: Secondary | ICD-10-CM | POA: Diagnosis not present

## 2017-07-27 DIAGNOSIS — J45909 Unspecified asthma, uncomplicated: Secondary | ICD-10-CM | POA: Insufficient documentation

## 2017-07-27 DIAGNOSIS — I10 Essential (primary) hypertension: Secondary | ICD-10-CM | POA: Insufficient documentation

## 2017-07-27 DIAGNOSIS — Z419 Encounter for procedure for purposes other than remedying health state, unspecified: Secondary | ICD-10-CM

## 2017-07-27 DIAGNOSIS — Z888 Allergy status to other drugs, medicaments and biological substances status: Secondary | ICD-10-CM | POA: Insufficient documentation

## 2017-07-27 DIAGNOSIS — K219 Gastro-esophageal reflux disease without esophagitis: Secondary | ICD-10-CM | POA: Insufficient documentation

## 2017-07-27 DIAGNOSIS — Z90710 Acquired absence of both cervix and uterus: Secondary | ICD-10-CM | POA: Diagnosis not present

## 2017-07-27 DIAGNOSIS — E119 Type 2 diabetes mellitus without complications: Secondary | ICD-10-CM | POA: Insufficient documentation

## 2017-07-27 DIAGNOSIS — Z91040 Latex allergy status: Secondary | ICD-10-CM | POA: Insufficient documentation

## 2017-07-27 DIAGNOSIS — Z91041 Radiographic dye allergy status: Secondary | ICD-10-CM | POA: Diagnosis not present

## 2017-07-27 DIAGNOSIS — Z8673 Personal history of transient ischemic attack (TIA), and cerebral infarction without residual deficits: Secondary | ICD-10-CM | POA: Insufficient documentation

## 2017-07-27 DIAGNOSIS — I739 Peripheral vascular disease, unspecified: Secondary | ICD-10-CM | POA: Insufficient documentation

## 2017-07-27 DIAGNOSIS — F419 Anxiety disorder, unspecified: Secondary | ICD-10-CM | POA: Insufficient documentation

## 2017-07-27 DIAGNOSIS — Z79899 Other long term (current) drug therapy: Secondary | ICD-10-CM | POA: Insufficient documentation

## 2017-07-27 DIAGNOSIS — M4323 Fusion of spine, cervicothoracic region: Secondary | ICD-10-CM | POA: Diagnosis not present

## 2017-07-27 DIAGNOSIS — J449 Chronic obstructive pulmonary disease, unspecified: Secondary | ICD-10-CM | POA: Diagnosis not present

## 2017-07-27 HISTORY — PX: LUMBAR LAMINECTOMY/DECOMPRESSION MICRODISCECTOMY: SHX5026

## 2017-07-27 LAB — GLUCOSE, CAPILLARY
GLUCOSE-CAPILLARY: 108 mg/dL — AB (ref 65–99)
Glucose-Capillary: 146 mg/dL — ABNORMAL HIGH (ref 65–99)
Glucose-Capillary: 156 mg/dL — ABNORMAL HIGH (ref 65–99)

## 2017-07-27 LAB — ABO/RH: ABO/RH(D): O POS

## 2017-07-27 SURGERY — LUMBAR LAMINECTOMY/DECOMPRESSION MICRODISCECTOMY 3 LEVELS
Anesthesia: General | Site: Spine Lumbar | Wound class: Clean

## 2017-07-27 MED ORDER — BACITRACIN 50000 UNITS IM SOLR
INTRAMUSCULAR | Status: DC | PRN
  Administered 2017-07-27: 50000 [IU]

## 2017-07-27 MED ORDER — TIOTROPIUM BROMIDE MONOHYDRATE 18 MCG IN CAPS: 18.0000 ug | ORAL_CAPSULE | Freq: Every day | RESPIRATORY_TRACT | Status: DC

## 2017-07-27 MED ORDER — METHOCARBAMOL 1000 MG/10ML IJ SOLN
500.0000 mg | Freq: Four times a day (QID) | INTRAVENOUS | Status: DC | PRN
  Filled 2017-07-27: qty 5

## 2017-07-27 MED ORDER — ACETAMINOPHEN 10 MG/ML IV SOLN
INTRAVENOUS | Status: DC | PRN
  Administered 2017-07-27: 1000 mg via INTRAVENOUS

## 2017-07-27 MED ORDER — EVICEL 5 ML EX KIT
PACK | CUTANEOUS | Status: AC
  Filled 2017-07-27: qty 1

## 2017-07-27 MED ORDER — SODIUM CHLORIDE 0.9% FLUSH: 3.0000 mL | INTRAVENOUS | Status: DC | PRN

## 2017-07-27 MED ORDER — ALBUTEROL SULFATE HFA 108 (90 BASE) MCG/ACT IN AERS: 2.0000 | INHALATION_SPRAY | Freq: Four times a day (QID) | RESPIRATORY_TRACT | Status: DC | PRN

## 2017-07-27 MED ORDER — ATORVASTATIN CALCIUM 20 MG PO TABS
40.0000 mg | ORAL_TABLET | Freq: Every day | ORAL | Status: DC
  Administered 2017-07-27 – 2017-07-28 (×2): 40 mg via ORAL
  Filled 2017-07-27 (×2): qty 2

## 2017-07-27 MED ORDER — ROCURONIUM BROMIDE 50 MG/5ML IV SOLN
INTRAVENOUS | Status: AC
  Filled 2017-07-27: qty 1

## 2017-07-27 MED ORDER — MIDAZOLAM HCL 2 MG/2ML IJ SOLN
INTRAMUSCULAR | Status: AC
  Filled 2017-07-27: qty 2

## 2017-07-27 MED ORDER — FENTANYL CITRATE (PF) 100 MCG/2ML IJ SOLN
INTRAMUSCULAR | Status: DC | PRN
  Administered 2017-07-27: 150 ug via INTRAVENOUS
  Administered 2017-07-27: 50 ug via INTRAVENOUS

## 2017-07-27 MED ORDER — LIDOCAINE HCL (CARDIAC) 20 MG/ML IV SOLN
INTRAVENOUS | Status: DC | PRN
  Administered 2017-07-27: 50 mg via INTRAVENOUS

## 2017-07-27 MED ORDER — PROPOFOL 10 MG/ML IV BOLUS
INTRAVENOUS | Status: AC
  Filled 2017-07-27: qty 20

## 2017-07-27 MED ORDER — ACETAMINOPHEN 325 MG PO TABS: 650.0000 mg | ORAL_TABLET | ORAL | Status: DC | PRN

## 2017-07-27 MED ORDER — THROMBIN 5000 UNITS EX SOLR
CUTANEOUS | Status: DC | PRN
Start: 2017-07-27 — End: 2017-07-27
  Administered 2017-07-27: 5000 [IU] via TOPICAL

## 2017-07-27 MED ORDER — SODIUM CHLORIDE 0.9 % IV SOLN: 250.0000 mL | INTRAVENOUS | Status: DC

## 2017-07-27 MED ORDER — THROMBIN 5000 UNITS EX SOLR
CUTANEOUS | Status: AC
  Filled 2017-07-27: qty 5000

## 2017-07-27 MED ORDER — INSULIN ASPART 100 UNIT/ML ~~LOC~~ SOLN: 0.0000 [IU] | Freq: Every day | SUBCUTANEOUS | Status: DC

## 2017-07-27 MED ORDER — BUPIVACAINE LIPOSOME 1.3 % IJ SUSP
INTRAMUSCULAR | Status: DC | PRN
  Administered 2017-07-27: 20 mL

## 2017-07-27 MED ORDER — HYDROMORPHONE HCL 1 MG/ML IJ SOLN
0.5000 mg | INTRAMUSCULAR | Status: DC | PRN
  Administered 2017-07-28: 0.5 mg via INTRAVENOUS
  Filled 2017-07-27: qty 1

## 2017-07-27 MED ORDER — VANCOMYCIN HCL IN DEXTROSE 1-5 GM/200ML-% IV SOLN
1000.0000 mg | INTRAVENOUS | Status: AC
  Administered 2017-07-27: 1000 mg via INTRAVENOUS

## 2017-07-27 MED ORDER — DEXAMETHASONE SODIUM PHOSPHATE 4 MG/ML IJ SOLN
4.0000 mg | Freq: Four times a day (QID) | INTRAMUSCULAR | Status: DC
  Filled 2017-07-27 (×5): qty 1

## 2017-07-27 MED ORDER — BUPIVACAINE HCL (PF) 0.5 % IJ SOLN
INTRAMUSCULAR | Status: AC
  Filled 2017-07-27: qty 30

## 2017-07-27 MED ORDER — METHYLPREDNISOLONE ACETATE 40 MG/ML IJ SUSP
INTRAMUSCULAR | Status: AC
  Filled 2017-07-27: qty 1

## 2017-07-27 MED ORDER — ONDANSETRON HCL 4 MG/2ML IJ SOLN
4.0000 mg | Freq: Once | INTRAMUSCULAR | Status: AC | PRN
Start: 2017-07-27 — End: 2017-07-27
  Administered 2017-07-27: 4 mg via INTRAVENOUS

## 2017-07-27 MED ORDER — INSULIN ASPART 100 UNIT/ML ~~LOC~~ SOLN
0.0000 [IU] | Freq: Three times a day (TID) | SUBCUTANEOUS | Status: DC
  Administered 2017-07-28: 3 [IU] via SUBCUTANEOUS
  Administered 2017-07-28: 5 [IU] via SUBCUTANEOUS
  Filled 2017-07-27 (×2): qty 1

## 2017-07-27 MED ORDER — DEXAMETHASONE 4 MG PO TABS
4.0000 mg | ORAL_TABLET | Freq: Four times a day (QID) | ORAL | Status: DC
  Administered 2017-07-27 – 2017-07-28 (×4): 4 mg via ORAL
  Filled 2017-07-27 (×4): qty 1

## 2017-07-27 MED ORDER — FENTANYL CITRATE (PF) 100 MCG/2ML IJ SOLN: 25.0000 ug | INTRAMUSCULAR | Status: DC | PRN

## 2017-07-27 MED ORDER — METFORMIN HCL 500 MG PO TABS
1000.0000 mg | ORAL_TABLET | Freq: Two times a day (BID) | ORAL | Status: DC
  Administered 2017-07-28: 1000 mg via ORAL
  Filled 2017-07-27 (×2): qty 2

## 2017-07-27 MED ORDER — EVICEL 5 ML EX KIT
PACK | CUTANEOUS | Status: DC | PRN
  Administered 2017-07-27: 5 mL

## 2017-07-27 MED ORDER — PROPOFOL 10 MG/ML IV BOLUS
INTRAVENOUS | Status: DC | PRN
  Administered 2017-07-27: 40 mg via INTRAVENOUS
  Administered 2017-07-27: 110 mg via INTRAVENOUS

## 2017-07-27 MED ORDER — ONDANSETRON HCL 4 MG PO TABS: 4.0000 mg | ORAL_TABLET | Freq: Four times a day (QID) | ORAL | Status: DC | PRN

## 2017-07-27 MED ORDER — BUPIVACAINE LIPOSOME 1.3 % IJ SUSP
INTRAMUSCULAR | Status: AC
  Filled 2017-07-27: qty 20

## 2017-07-27 MED ORDER — ALBUTEROL SULFATE (2.5 MG/3ML) 0.083% IN NEBU: 2.5000 mg | INHALATION_SOLUTION | Freq: Four times a day (QID) | RESPIRATORY_TRACT | Status: DC | PRN

## 2017-07-27 MED ORDER — METHOCARBAMOL 500 MG PO TABS: 500.0000 mg | ORAL_TABLET | Freq: Four times a day (QID) | ORAL | Status: DC | PRN

## 2017-07-27 MED ORDER — SENNOSIDES-DOCUSATE SODIUM 8.6-50 MG PO TABS: 1.0000 | ORAL_TABLET | Freq: Every evening | ORAL | Status: DC | PRN

## 2017-07-27 MED ORDER — GEMFIBROZIL 600 MG PO TABS: 600.0000 mg | ORAL_TABLET | Freq: Two times a day (BID) | ORAL | Status: DC

## 2017-07-27 MED ORDER — ONDANSETRON HCL 4 MG/2ML IJ SOLN
INTRAMUSCULAR | Status: AC
  Filled 2017-07-27: qty 2

## 2017-07-27 MED ORDER — BUPIVACAINE-EPINEPHRINE (PF) 0.5% -1:200000 IJ SOLN
INTRAMUSCULAR | Status: DC | PRN
Start: 2017-07-27 — End: 2017-07-27
  Administered 2017-07-27: 20 mL

## 2017-07-27 MED ORDER — PHENYLEPHRINE HCL 10 MG/ML IJ SOLN
INTRAMUSCULAR | Status: DC | PRN
  Administered 2017-07-27 (×3): 100 ug via INTRAVENOUS

## 2017-07-27 MED ORDER — LOSARTAN POTASSIUM 50 MG PO TABS
50.0000 mg | ORAL_TABLET | Freq: Two times a day (BID) | ORAL | Status: DC
  Administered 2017-07-27 – 2017-07-28 (×2): 50 mg via ORAL
  Filled 2017-07-27 (×2): qty 1

## 2017-07-27 MED ORDER — HYDROCHLOROTHIAZIDE 25 MG PO TABS
25.0000 mg | ORAL_TABLET | Freq: Every day | ORAL | Status: DC
  Administered 2017-07-28: 25 mg via ORAL
  Filled 2017-07-27: qty 1

## 2017-07-27 MED ORDER — SUCCINYLCHOLINE CHLORIDE 20 MG/ML IJ SOLN
INTRAMUSCULAR | Status: DC | PRN
  Administered 2017-07-27: 80 mg via INTRAVENOUS

## 2017-07-27 MED ORDER — GELATIN ABSORBABLE 12-7 MM EX MISC
CUTANEOUS | Status: DC | PRN
  Administered 2017-07-27: 1

## 2017-07-27 MED ORDER — ACETAMINOPHEN 650 MG RE SUPP: 650.0000 mg | RECTAL | Status: DC | PRN

## 2017-07-27 MED ORDER — ONDANSETRON HCL 4 MG/2ML IJ SOLN
INTRAMUSCULAR | Status: DC | PRN
Start: 2017-07-27 — End: 2017-07-27
  Administered 2017-07-27: 4 mg via INTRAVENOUS

## 2017-07-27 MED ORDER — ONDANSETRON HCL 4 MG/2ML IJ SOLN: 4.0000 mg | Freq: Four times a day (QID) | INTRAMUSCULAR | Status: DC | PRN

## 2017-07-27 MED ORDER — SODIUM CHLORIDE FLUSH 0.9 % IV SOLN
INTRAVENOUS | Status: AC
  Filled 2017-07-27: qty 10

## 2017-07-27 MED ORDER — MIDAZOLAM HCL 2 MG/2ML IJ SOLN
INTRAMUSCULAR | Status: DC | PRN
  Administered 2017-07-27: 1 mg via INTRAVENOUS

## 2017-07-27 MED ORDER — BACITRACIN 50000 UNITS IM SOLR
INTRAMUSCULAR | Status: AC
  Filled 2017-07-27: qty 1

## 2017-07-27 MED ORDER — SODIUM CHLORIDE 0.9 % IV SOLN
INTRAVENOUS | Status: DC
  Administered 2017-07-27: 10:00:00 via INTRAVENOUS

## 2017-07-27 MED ORDER — LORATADINE 10 MG PO TABS
10.0000 mg | ORAL_TABLET | Freq: Every day | ORAL | Status: DC
  Administered 2017-07-28: 10 mg via ORAL
  Filled 2017-07-27: qty 1

## 2017-07-27 MED ORDER — SODIUM CHLORIDE 0.9% FLUSH: 3.0000 mL | Freq: Two times a day (BID) | INTRAVENOUS | Status: DC

## 2017-07-27 MED ORDER — FENTANYL CITRATE (PF) 250 MCG/5ML IJ SOLN
INTRAMUSCULAR | Status: AC
  Filled 2017-07-27: qty 5

## 2017-07-27 MED ORDER — SUGAMMADEX SODIUM 200 MG/2ML IV SOLN
INTRAVENOUS | Status: DC | PRN
  Administered 2017-07-27: 130 mg via INTRAVENOUS

## 2017-07-27 MED ORDER — SODIUM CHLORIDE 0.9 % IV SOLN
INTRAVENOUS | Status: DC | PRN
  Administered 2017-07-27: 10 ug/min via INTRAVENOUS

## 2017-07-27 MED ORDER — GELATIN ABSORBABLE 12-7 MM EX MISC
CUTANEOUS | Status: AC
  Filled 2017-07-27: qty 1

## 2017-07-27 MED ORDER — BUPIVACAINE-EPINEPHRINE (PF) 0.5% -1:200000 IJ SOLN
INTRAMUSCULAR | Status: AC
  Filled 2017-07-27: qty 30

## 2017-07-27 MED ORDER — METOPROLOL TARTRATE 25 MG PO TABS
12.5000 mg | ORAL_TABLET | Freq: Two times a day (BID) | ORAL | Status: DC
  Administered 2017-07-27 – 2017-07-28 (×2): 12.5 mg via ORAL
  Filled 2017-07-27 (×2): qty 1

## 2017-07-27 MED ORDER — SODIUM CHLORIDE 0.9 % IJ SOLN
INTRAMUSCULAR | Status: AC
  Filled 2017-07-27: qty 50

## 2017-07-27 MED ORDER — DEXAMETHASONE SODIUM PHOSPHATE 10 MG/ML IJ SOLN
INTRAMUSCULAR | Status: AC
  Filled 2017-07-27: qty 1

## 2017-07-27 MED ORDER — DEXAMETHASONE SODIUM PHOSPHATE 10 MG/ML IJ SOLN
INTRAMUSCULAR | Status: DC | PRN
  Administered 2017-07-27: 4 mg via INTRAVENOUS

## 2017-07-27 MED ORDER — ACETAMINOPHEN 500 MG PO TABS
1000.0000 mg | ORAL_TABLET | Freq: Four times a day (QID) | ORAL | Status: DC
  Administered 2017-07-27 – 2017-07-28 (×3): 1000 mg via ORAL
  Filled 2017-07-27 (×4): qty 2

## 2017-07-27 MED ORDER — ROCURONIUM BROMIDE 100 MG/10ML IV SOLN
INTRAVENOUS | Status: DC | PRN
  Administered 2017-07-27 (×2): 10 mg via INTRAVENOUS
  Administered 2017-07-27: 45 mg via INTRAVENOUS
  Administered 2017-07-27: 10 mg via INTRAVENOUS
  Administered 2017-07-27: 5 mg via INTRAVENOUS

## 2017-07-27 MED ORDER — BUPIVACAINE-EPINEPHRINE 0.25% -1:200000 IJ SOLN
INTRAMUSCULAR | Status: DC | PRN
  Administered 2017-07-27: 10 mL

## 2017-07-27 MED ORDER — LIDOCAINE HCL (PF) 2 % IJ SOLN
INTRAMUSCULAR | Status: AC
  Filled 2017-07-27: qty 2

## 2017-07-27 MED ORDER — VITAMIN B-12 1000 MCG PO TABS
500.0000 ug | ORAL_TABLET | Freq: Every day | ORAL | Status: DC
  Administered 2017-07-27: 500 ug via ORAL
  Filled 2017-07-27: qty 1

## 2017-07-27 MED ORDER — SODIUM CHLORIDE 0.9 % IV SOLN
INTRAVENOUS | Status: DC
  Administered 2017-07-27 – 2017-07-28 (×2): via INTRAVENOUS

## 2017-07-27 MED ORDER — METHYLPREDNISOLONE ACETATE 40 MG/ML IJ SUSP
INTRAMUSCULAR | Status: DC | PRN
Start: 2017-07-27 — End: 2017-07-27
  Administered 2017-07-27: 40 mg

## 2017-07-27 MED ORDER — FAMOTIDINE 20 MG PO TABS
10.0000 mg | ORAL_TABLET | Freq: Every day | ORAL | Status: DC
  Administered 2017-07-27 – 2017-07-28 (×2): 10 mg via ORAL
  Filled 2017-07-27 (×2): qty 1

## 2017-07-27 MED ORDER — OXYCODONE HCL 5 MG PO TABS
5.0000 mg | ORAL_TABLET | ORAL | Status: DC | PRN
  Administered 2017-07-27 – 2017-07-28 (×3): 5 mg via ORAL
  Administered 2017-07-28: 10 mg via ORAL
  Filled 2017-07-27: qty 2
  Filled 2017-07-27 (×3): qty 1

## 2017-07-27 SURGICAL SUPPLY — 58 items
BAND RUBBER 3X1/6 TAN STRL (MISCELLANEOUS) IMPLANT
BLADE BOVIE TIP EXT 4 (BLADE) ×2 IMPLANT
BUR NEURO DRILL SOFT 3.0X3.8M (BURR) ×2 IMPLANT
CANISTER SUCT 1200ML W/VALVE (MISCELLANEOUS) ×4 IMPLANT
CHLORAPREP W/TINT 26ML (MISCELLANEOUS) ×2 IMPLANT
CNTNR SPEC 2.5X3XGRAD LEK (MISCELLANEOUS) ×1
CONT SPEC 4OZ STER OR WHT (MISCELLANEOUS) ×1
CONTAINER SPEC 2.5X3XGRAD LEK (MISCELLANEOUS) ×1 IMPLANT
COUNTER NEEDLE 20/40 LG (NEEDLE) ×2 IMPLANT
COVER LIGHT HANDLE STERIS (MISCELLANEOUS) ×4 IMPLANT
CUP MEDICINE 2OZ PLAST GRAD ST (MISCELLANEOUS) ×4 IMPLANT
DERMABOND ADVANCED (GAUZE/BANDAGES/DRESSINGS) ×1
DERMABOND ADVANCED .7 DNX12 (GAUZE/BANDAGES/DRESSINGS) ×1 IMPLANT
DRAPE C-ARM 42X72 X-RAY (DRAPES) ×4 IMPLANT
DRAPE LAPAROTOMY 100X77 ABD (DRAPES) ×2 IMPLANT
DRAPE MICROSCOPE SPINE 48X150 (DRAPES) ×2 IMPLANT
DRAPE POUCH INSTRU U-SHP 10X18 (DRAPES) ×2 IMPLANT
DRAPE SURG 17X11 SM STRL (DRAPES) ×8 IMPLANT
DRSG TEGADERM 4X4.75 (GAUZE/BANDAGES/DRESSINGS) ×2 IMPLANT
DRSG TELFA 4X3 1S NADH ST (GAUZE/BANDAGES/DRESSINGS) ×2 IMPLANT
DURASEAL APPLICATOR TIP (TIP) IMPLANT
DURASEAL SPINE SEALANT 3ML (MISCELLANEOUS) IMPLANT
ELECT CAUTERY BLADE TIP 2.5 (TIP) ×2
ELECT EZSTD 165MM 6.5IN (MISCELLANEOUS) ×2
ELECTRODE CAUTERY BLDE TIP 2.5 (TIP) ×1 IMPLANT
ELECTRODE EZSTD 165MM 6.5IN (MISCELLANEOUS) ×1 IMPLANT
FRAME EYE SHIELD (PROTECTIVE WEAR) ×2 IMPLANT
GLOVE SURG SYN 8.5  E (GLOVE) ×3
GLOVE SURG SYN 8.5 E (GLOVE) ×3 IMPLANT
GOWN SRG XL LVL 3 NONREINFORCE (GOWNS) ×1 IMPLANT
GOWN STRL NON-REIN TWL XL LVL3 (GOWNS) ×1
GOWN STRL REUS W/ TWL LRG LVL3 (GOWN DISPOSABLE) ×1 IMPLANT
GOWN STRL REUS W/TWL LRG LVL3 (GOWN DISPOSABLE) ×1
GRADUATE 1200CC STRL 31836 (MISCELLANEOUS) ×2 IMPLANT
GRAFT DURAGEN MATRIX 1WX1L (Tissue) ×2 IMPLANT
KIT SPINAL PRONEVIEW (KITS) ×2 IMPLANT
KNIFE BAYONET SHORT DISCETOMY (MISCELLANEOUS) IMPLANT
MARKER SKIN DUAL TIP RULER LAB (MISCELLANEOUS) ×6 IMPLANT
NDL SAFETY ECLIPSE 18X1.5 (NEEDLE) ×1 IMPLANT
NEEDLE HYPO 18GX1.5 SHARP (NEEDLE) ×1
NEEDLE HYPO 22GX1.5 SAFETY (NEEDLE) ×2 IMPLANT
NS IRRIG 1000ML POUR BTL (IV SOLUTION) ×2 IMPLANT
PACK LAMINECTOMY NEURO (CUSTOM PROCEDURE TRAY) ×2 IMPLANT
PAD ARMBOARD 7.5X6 YLW CONV (MISCELLANEOUS) ×2 IMPLANT
PATTIES SURGICAL .5X1.5 (GAUZE/BANDAGES/DRESSINGS) IMPLANT
SPOGE SURGIFLO 8M (HEMOSTASIS) ×1
SPONGE SURGIFLO 8M (HEMOSTASIS) ×1 IMPLANT
STAPLER SKIN PROX 35W (STAPLE) IMPLANT
SUT DVC VLOC 3-0 CL 6 P-12 (SUTURE) ×2 IMPLANT
SUT NURALON 4 0 TR CR/8 (SUTURE) IMPLANT
SUT VIC AB 0 CT1 27 (SUTURE) ×1
SUT VIC AB 0 CT1 27XCR 8 STRN (SUTURE) ×1 IMPLANT
SUT VIC AB 2-0 CT1 18 (SUTURE) ×2 IMPLANT
SYR 20CC LL (SYRINGE) ×2 IMPLANT
SYRINGE 10CC LL (SYRINGE) ×2 IMPLANT
TOWEL OR 17X26 4PK STRL BLUE (TOWEL DISPOSABLE) ×8 IMPLANT
TUBE METRX 18MMX5CM (INSTRUMENTS) ×2 IMPLANT
TUBING CONNECTING 10 (TUBING) ×2 IMPLANT

## 2017-07-27 NOTE — Transfer of Care (Signed)
Immediate Anesthesia Transfer of Care Note  Patient: Judy Bolton  Procedure(s) Performed: Procedure(s): LUMBAR LAMINECTOMY/DECOMPRESSION MICRODISCECTOMY 3 LEVELS-L3-S1 (N/A)  Patient Location: PACU  Anesthesia Type:General  Level of Consciousness: sedated and responds to stimulation  Airway & Oxygen Therapy: Patient Spontanous Breathing and Patient connected to face mask oxygen  Post-op Assessment: Report given to RN and Post -op Vital signs reviewed and stable  Post vital signs: Reviewed and stable  Last Vitals:  Vitals:   07/27/17 1430 07/27/17 1431  BP: (!) 143/61 (!) 143/61  Pulse: 81 72  Resp: 13   Temp: (!) 36 C   SpO2: 100% 100%    Last Pain:  Vitals:   07/27/17 0911  TempSrc: Tympanic  PainSc: 6          Complications: No apparent anesthesia complications

## 2017-07-27 NOTE — Progress Notes (Signed)
Vancomycin per pharmacy consult for Surgical prophylaxis  71 yo F   Wt= 64.4 kg   Ht 71ft2in   Will order Vancomycin 1 gram IV x 1 for preop dose (15 mg/kg).  Bari Mantis PharmD Clinical Pharmacist 07/27/2017

## 2017-07-27 NOTE — Anesthesia Preprocedure Evaluation (Addendum)
Anesthesia Evaluation  Patient identified by MRN, date of birth, ID band Patient awake    Reviewed: Allergy & Precautions, NPO status , Patient's Chart, lab work & pertinent test results, reviewed documented beta blocker date and time   Airway Mallampati: II  TM Distance: >3 FB     Dental  (+) Chipped   Pulmonary asthma , COPD, former smoker,           Cardiovascular hypertension, Pt. on medications + Peripheral Vascular Disease       Neuro/Psych Anxiety TIA   GI/Hepatic GERD  Controlled,  Endo/Other  diabetes, Type 2  Renal/GU      Musculoskeletal  (+) Arthritis ,   Abdominal   Peds  Hematology   Anesthesia Other Findings No cardiac symptoms. EKG reviewed.  Reproductive/Obstetrics                            Anesthesia Physical Anesthesia Plan  ASA: III  Anesthesia Plan: General   Post-op Pain Management:    Induction: Intravenous  PONV Risk Score and Plan:   Airway Management Planned: Oral ETT  Additional Equipment:   Intra-op Plan:   Post-operative Plan:   Informed Consent: I have reviewed the patients History and Physical, chart, labs and discussed the procedure including the risks, benefits and alternatives for the proposed anesthesia with the patient or authorized representative who has indicated his/her understanding and acceptance.     Plan Discussed with: CRNA  Anesthesia Plan Comments:         Anesthesia Quick Evaluation

## 2017-07-27 NOTE — Op Note (Signed)
Indications: Ms. Baechle is a 71 yo female with lumbar stenosis suffering from neurogenic claudication. She failed conservative management, so elected for surgery.  Findings: Severe stenosis, worst at L4-5  Preoperative Diagnosis: Lumbar Stenosis with neurogenic claudication Postoperative Diagnosis: same   EBL: 200 ml IVF: 800 ml Drains: none Disposition: Extubated and Stable to PACU Complications: none  No foley catheter was placed.   Preoperative Note:   Risks of surgery discussed include: infection, bleeding, stroke, coma, death, paralysis, CSF leak, nerve/spinal cord injury, numbness, tingling, weakness, complex regional pain syndrome, recurrent stenosis and/or disc herniation, vascular injury, development of instability, neck/back pain, need for further surgery, persistent symptoms, development of deformity, and the risks of anesthesia. They understood these risks and have agreed to proceed.  Operative Note:   1. L3-S1 lumbar decompression including central laminectomy and bilateral medial facetectomies including foraminotomies  The patient was then brought from the preoperative center with intravenous access established.  The patient underwent general anesthesia and endotracheal tube intubation, and was then rotated on the [place] rail top where all pressure points were appropriately padded.  The skin was then thoroughly cleansed.  Perioperative antibiotic prophylaxis was administered.  Sterile prep and drapes were then applied and a timeout was then observed.  C-arm was brought into the field under sterile conditions and under lateral visualization the L3-S1 interspaces were identified and marked.  The incision was marked on the left and injected with local anesthetic. Once this was complete a 6 cm incision was opened with the use of a #10 blade knife.  Bovie electrocautery was used to divide the soft tissues  The metrx tubes were sequentially advanced and confirmed in position. An  70mm by 50 mm tube was locked in place to the bed side attachment.  Fluoroscopy was then removed from the field.  The microscope was then sterilely brought into the field and muscle creep was hemostased with a bipolar and resected with a pituitary rongeur.  A Bovie extender was then used to expose the spinous process and lamina.  Careful attention was placed to not violate the facet capsule. A 3 mm matchstick drill bit was then used to make a hemi-laminotomy trough until the ligamentum flavum was exposed.  This was extended to the base of the spinous process and to the contralateral side to remove all the central bone from each side.  Once this was complete and the underlying ligamentum flavum was visualized, it was dissected with a curette and resected with Kerrison rongeurs.  Extensive ligamentum hypertrophy was noted, requiring a substantial amount of time and care for removal.  The dura was identified and palpated. The kerrison rongeur was then used to remove the medial facet bilaterally until no compression was noted.  A balltip probe was used to confirm decompression of the left L5 nerve root.  Additional attention was paid to completion of the contralateral L4-5 foraminotomy until the right L5 nerve root was completely free.  Once this was complete, L4-5 central decompression including medial facetectomy and foraminotomy was confirmed and decompression on both sides was confirmed. No CSF leak was noted.  A Depo-Medrol soaked Gelfoam pledget was placed in the defect.  The wound was copiously irrigated. The tube system was then removed under microscopic visualization and hemostasis was obtained with a bipolar.    The metrx tubes were sequentially advanced and confirmed in position at L5-S1. An 11mm by 50 mm tube was locked in place to the bed side attachment.  Fluoroscopy was then removed from  the field.  The microscope was then sterilely brought into the field and muscle creep was hemostased with a  bipolar and resected with a pituitary rongeur.  A Bovie extender was then used to expose the spinous process and lamina.  Careful attention was placed to not violate the facet capsule. A 3 mm matchstick drill bit was then used to make a hemi-laminotomy trough until the ligamentum flavum was exposed.  This was extended to the base of the spinous process and to the contralateral side to remove all the central bone from each side.  Once this was complete and the underlying ligamentum flavum was visualized, it was dissected with a curette and resected with Kerrison rongeurs.  Extensive ligamentum hypertrophy was noted, requiring a substantial amount of time and care for removal.  The dura was identified and palpated. The kerrison rongeur was then used to remove the medial facet bilaterally until no compression was noted.  A balltip probe was used to confirm decompression of the left S1 nerve root.  Additional attention was paid to completion of the contralateral L5-S1 foraminotomy until the right S1 nerve root was completely free.  Once this was complete, L5-S1 central decompression including medial facetectomy and foraminotomy was confirmed and decompression on both sides was confirmed. No CSF leak was noted.  A Depo-Medrol soaked Gelfoam pledget was placed in the defect.  The wound was copiously irrigated. The tube system was then removed under microscopic visualization and hemostasis was obtained with a bipolar.     The metrx tubes were sequentially advanced and confirmed in position at L3-4. An 38mm by 50 mm tube was locked in place to the bed side attachment.  Fluoroscopy was then removed from the field.  The microscope was then sterilely brought into the field and muscle creep was hemostased with a bipolar and resected with a pituitary rongeur.  A Bovie extender was then used to expose the spinous process and lamina.  Careful attention was placed to not violate the facet capsule. A 3 mm matchstick drill bit  was then used to make a hemi-laminotomy trough until the ligamentum flavum was exposed.  This was extended to the base of the spinous process and to the contralateral side to remove all the central bone from each side.  Once this was complete and the underlying ligamentum flavum was visualized, it was dissected with a curette and resected with Kerrison rongeurs.  Extensive ligamentum hypertrophy was noted, requiring a substantial amount of time and care for removal.  The dura was identified and palpated. The kerrison rongeur was then used to remove the medial facet bilaterally until no compression was noted.  A balltip probe was used to confirm decompression of the left L4 nerve root.  Additional attention was paid to completion of the contralateral L3-4 foraminotomy until the right L4 nerve root was completely free.  Once this was complete, L3-4 central decompression including medial facetectomy and foraminotomy was confirmed and decompression on both sides was confirmed. No CSF leak was noted.  A Depo-Medrol soaked Gelfoam pledget was placed in the defect.  The wound was copiously irrigated. The tube system was then removed under microscopic visualization and hemostasis was obtained with a bipolar.      The fascial layer was reapproximated with the use of a 0 Vicryl suture.  Subcutaneous tissue layer was reapproximated using 2-0 Vicryl suture.  3-0 monocryl was placed in subcuticular fashion. The skin was then cleansed and Dermabond was used to close the skin opening.  Patient was then rotated back to the preoperative bed awakened from anesthesia and taken to recovery all counts are correct in this case.  I performed the entire procedure with the assistance of Ivar Drape PA as an Designer, television/film set.  Shagun Wordell K. Myer Haff MD

## 2017-07-27 NOTE — Progress Notes (Signed)
Patient admitted to unit. Patient alert and oriented x4. Patient was educated about pt safety and call system. Skin assessment and admission completed. [place] dressing in place, clean, dry and intact.  Foot pumps and TEDs on.  Stephannie Peters, RN

## 2017-07-27 NOTE — Anesthesia Post-op Follow-up Note (Signed)
Anesthesia QCDR form completed.        

## 2017-07-27 NOTE — H&P (Signed)
  I have reviewed and confirmed my history and physical from 07/14/17 with no additions or changes. Plan for L3-S1 decompression.  Risks and benefits reviewed.  Heart sounds normal no MRG. Chest Clear to Auscultation Bilaterally.

## 2017-07-27 NOTE — Anesthesia Postprocedure Evaluation (Signed)
Anesthesia Post Note  Patient: Judy Bolton  Procedure(s) Performed: Procedure(s) (LRB): LUMBAR LAMINECTOMY/DECOMPRESSION MICRODISCECTOMY 3 LEVELS-L3-S1 (N/A)  Patient location during evaluation: PACU Anesthesia Type: General Level of consciousness: awake and alert Pain management: pain level controlled Vital Signs Assessment: post-procedure vital signs reviewed and stable Respiratory status: spontaneous breathing, nonlabored ventilation, respiratory function stable and patient connected to nasal cannula oxygen Cardiovascular status: blood pressure returned to baseline and stable Postop Assessment: no signs of nausea or vomiting Anesthetic complications: no     Last Vitals:  Vitals:   07/27/17 1431 07/27/17 1445  BP: (!) 143/61 (!) 149/72  Pulse: 72 80  Resp:  20  Temp:    SpO2: 100% 100%    Last Pain:  Vitals:   07/27/17 1445  TempSrc:   PainSc: 0-No pain                 Kayia Billinger S

## 2017-07-27 NOTE — Anesthesia Procedure Notes (Signed)
Procedure Name: Intubation Performed by: Madeeha Costantino Pre-anesthesia Checklist: Patient identified, Patient being monitored, Timeout performed, Emergency Drugs available and Suction available Patient Re-evaluated:Patient Re-evaluated prior to induction Oxygen Delivery Method: Circle system utilized Preoxygenation: Pre-oxygenation with 100% oxygen Induction Type: IV induction Ventilation: Mask ventilation without difficulty Laryngoscope Size: Mac and 3 Grade View: Grade I Tube type: Oral Tube size: 7.0 mm Number of attempts: 1 Airway Equipment and Method: Stylet Placement Confirmation: ETT inserted through vocal cords under direct vision,  positive ETCO2 and breath sounds checked- equal and bilateral Secured at: 21 cm Tube secured with: Tape Dental Injury: Teeth and Oropharynx as per pre-operative assessment        

## 2017-07-28 ENCOUNTER — Encounter: Payer: Self-pay | Admitting: Neurosurgery

## 2017-07-28 DIAGNOSIS — Z7982 Long term (current) use of aspirin: Secondary | ICD-10-CM | POA: Diagnosis not present

## 2017-07-28 DIAGNOSIS — I739 Peripheral vascular disease, unspecified: Secondary | ICD-10-CM | POA: Diagnosis not present

## 2017-07-28 DIAGNOSIS — K219 Gastro-esophageal reflux disease without esophagitis: Secondary | ICD-10-CM | POA: Diagnosis not present

## 2017-07-28 DIAGNOSIS — Z888 Allergy status to other drugs, medicaments and biological substances status: Secondary | ICD-10-CM | POA: Diagnosis not present

## 2017-07-28 DIAGNOSIS — E119 Type 2 diabetes mellitus without complications: Secondary | ICD-10-CM | POA: Diagnosis not present

## 2017-07-28 DIAGNOSIS — Z885 Allergy status to narcotic agent status: Secondary | ICD-10-CM | POA: Diagnosis not present

## 2017-07-28 DIAGNOSIS — Z8673 Personal history of transient ischemic attack (TIA), and cerebral infarction without residual deficits: Secondary | ICD-10-CM | POA: Diagnosis not present

## 2017-07-28 DIAGNOSIS — J45909 Unspecified asthma, uncomplicated: Secondary | ICD-10-CM | POA: Diagnosis not present

## 2017-07-28 DIAGNOSIS — Z91041 Radiographic dye allergy status: Secondary | ICD-10-CM | POA: Diagnosis not present

## 2017-07-28 DIAGNOSIS — Z79899 Other long term (current) drug therapy: Secondary | ICD-10-CM | POA: Diagnosis not present

## 2017-07-28 DIAGNOSIS — Z87891 Personal history of nicotine dependence: Secondary | ICD-10-CM | POA: Diagnosis not present

## 2017-07-28 DIAGNOSIS — I10 Essential (primary) hypertension: Secondary | ICD-10-CM | POA: Diagnosis not present

## 2017-07-28 DIAGNOSIS — M48062 Spinal stenosis, lumbar region with neurogenic claudication: Secondary | ICD-10-CM | POA: Diagnosis not present

## 2017-07-28 DIAGNOSIS — M1990 Unspecified osteoarthritis, unspecified site: Secondary | ICD-10-CM | POA: Diagnosis not present

## 2017-07-28 DIAGNOSIS — Z91040 Latex allergy status: Secondary | ICD-10-CM | POA: Diagnosis not present

## 2017-07-28 DIAGNOSIS — Z880 Allergy status to penicillin: Secondary | ICD-10-CM | POA: Diagnosis not present

## 2017-07-28 LAB — GLUCOSE, CAPILLARY
Glucose-Capillary: 189 mg/dL — ABNORMAL HIGH (ref 65–99)
Glucose-Capillary: 205 mg/dL — ABNORMAL HIGH (ref 65–99)

## 2017-07-28 MED ORDER — METHOCARBAMOL 500 MG PO TABS: 500.0000 mg | ORAL_TABLET | Freq: Four times a day (QID) | ORAL | 0 refills | Status: DC

## 2017-07-28 MED ORDER — OXYCODONE HCL 5 MG PO TABS: 5.0000 mg | ORAL_TABLET | ORAL | 0 refills | Status: DC | PRN

## 2017-07-28 NOTE — Discharge Summary (Signed)
  History: Judy Bolton is here for lumbar stenosis with neurogenic claudication POD1 s/p L3-S1 lumbar decompression. Patient tolerated procedure well. Patient has able to eat, void, and ambulate without issue. Pain is currently 3/10. No other complaints.   Physical Exam: Vitals:   07/28/17 0016 07/28/17 0346  BP: (!) 150/63 (!) 158/77  Pulse: 88 92  Resp: 18 18  Temp: 99.3 F (37.4 C) 100.2 F (37.9 C)  SpO2: 98% 95%    AA Ox3 CNI  Strength:5/5 throughout Sensation: intact and symmetric to light tough Incision site: wound dressed with honeycomb. C/D/I  Data:   Recent Labs Lab 07/21/17 0944  NA 141  K 4.1  CL 105  CO2 26  BUN 29*  CREATININE 1.21*  GLUCOSE 142*  CALCIUM 9.9   No results for input(s): AST, ALT, ALKPHOS in the last 168 hours.  Invalid input(s): TBILI    Recent Labs Lab 07/21/17 0944  WBC 11.5*  HGB 12.8  HCT 38.2  PLT 271    Recent Labs Lab 07/21/17 0944  APTT 30  INR 0.93         Assessment/Plan:  Judy Bolton is POD1 s/p L3-S1 lumbar decompression. She is doing very well postoperatively. Advised patient on post-op instructions (no BLT, driving restriction). All questions were answered. Patient will follow up in clinic in 2 weeks to monitor progress and call the office if any questions arise.  Ivar Drape PA-C Department of Neurosurgery

## 2017-07-28 NOTE — Evaluation (Signed)
Physical Therapy Evaluation Patient Details Name: Judy Bolton MRN: [id number] DOB: May 05, 1946 Today's Date: 07/28/2017   History of Present Illness  Pt is a 71 y.o. F s/p L3-S1 lumbar decompression including central laminectomy and B medial facetectomies with foraminotomies. PMH: stroke, HTN, HLD, DM, COPD, asthma.    Clinical Impression  Prior to admission pt reports independent with ADLs and functional mobility. Pt reports her cousin and neighbors are available to assist upon discharge. Pt A and O x4 throughout session. Pt reports 4/10 back pain beginning of session; increase to 7/10 back pain during activity; 6/10 back pain end of session in bedside chair; pt requesting pain meds (nursing notified). Pt currently modified independent for bed mobility; requires CGA and RW for transfers and ambulation; requires CGA and use of B railing for stair training. Pt will continue to benefit from skilled PT to address strength, balance and functional mobility deficits. Recommend follow up therapy as arranged by MD.    Follow Up Recommendations DC plan and follow up therapy as arranged by surgeon    Equipment Recommendations  Rolling walker with 5" wheels (Youth sized rolling walker)    Recommendations for Other Services       Precautions / Restrictions Precautions Precautions: Back;Fall Precaution Booklet Issued: Yes (comment) Precaution Comments: no bending, lifting, twisting, or arching Required Braces or Orthoses:  (no brace required per MD) Restrictions Weight Bearing Restrictions: No      Mobility  Bed Mobility Overal bed mobility: Modified Independent             General bed mobility comments: pt modified independent with bed mobility using log roll to maintain spinal precautions  Transfers Overall transfer level: Needs assistance Equipment used: Rolling walker (2 wheeled) Transfers: Sit to/from Stand Sit to Stand: Min guard         General transfer comment: upon  initial stand pt unable to achieve full upright due to back pain; pt able to perform sit to stand with CGA and RW rest of session with vc's for B LE and B UE placement  Ambulation/Gait Ambulation/Gait assistance: Min guard Ambulation Distance (Feet):  (100+100) Assistive device: Rolling walker (2 wheeled)   Gait velocity: decreased   General Gait Details: pt able to ambulate 100 ft with CGA and RW to PT gym to perform stairs; pt requests sitting rest break prior to and after stair training due to pain; pt able to ambulate 100 ft back to EOB with CGA and RW; pt demonstrates hesitation with initial gait but improves with more ambulation  Stairs Stairs: Yes Stairs assistance: Min guard Stair Management: Two rails Number of Stairs: 4 General stair comments: pt able to navigate stairs with use of B rails and CGA for safety  Wheelchair Mobility    Modified Rankin (Stroke Patients Only)       Balance Overall balance assessment: Needs assistance   Sitting balance-Leahy Scale: Fair Sitting balance - Comments: pt able to perform static sitting balance with no overt loss of balance noted   Standing balance support: Bilateral upper extremity supported Standing balance-Leahy Scale: Fair Standing balance comment: pt able to maintain static and dynamic standing balance with RW and CGA; pt able to briefly remove B UE from RW to adjust gown with no overt loss of balance noted                             Pertinent Vitals/Pain Pain Assessment: 0-10 Pain Location: pt  reports 4/10 back pain beginning of session; increase to 7/10 back pain during activity; 6/10 back pain end of session in bedside chair; pt requesting pain meds (nursing notified) Pain Descriptors / Indicators: Aching;Burning;Sore;Tender Pain Intervention(s): Limited activity within patient's tolerance;Monitored during session;Repositioned;Patient requesting pain meds-RN notified HR - 86 to 98 bpm throughout session via  pulse ox O2 - 94 to 98% on RA throughout session via pulse ox    Home Living Family/patient expects to be discharged to:: Private residence Living Arrangements: Alone Available Help at Discharge: Family;Neighbor Type of Home: Mobile home Home Access: Stairs to enter Entrance Stairs-Rails: Can reach both Entrance Stairs-Number of Steps: 4 Home Layout: One level Home Equipment: Walker - standard;Cane - single point;Grab bars - tub/shower;Hand held shower head      Prior Function Level of Independence: Independent         Comments: pt reports independent with ADLs, household activites and community needs     Hand Dominance        Extremity/Trunk Assessment   Upper Extremity Assessment Upper Extremity Assessment: Overall WFL for tasks assessed    Lower Extremity Assessment Lower Extremity Assessment: Generalized weakness       Communication   Communication: No difficulties  Cognition Arousal/Alertness: Awake/alert Behavior During Therapy: WFL for tasks assessed/performed Overall Cognitive Status: Within Functional Limits for tasks assessed                                        General Comments      Exercises     Assessment/Plan    PT Assessment Patient needs continued PT services  PT Problem List Decreased strength;Decreased activity tolerance;Decreased balance;Decreased mobility;Decreased knowledge of use of DME;Decreased knowledge of precautions;Pain       PT Treatment Interventions DME instruction;Gait training;Stair training;Functional mobility training;Therapeutic activities;Therapeutic exercise;Balance training;Patient/family education    PT Goals (Current goals can be found in the Care Plan section)  Acute Rehab PT Goals Patient Stated Goal: to get stronger and return home PT Goal Formulation: With patient Time For Goal Achievement: 08/11/17 Potential to Achieve Goals: Good    Frequency BID   Barriers to discharge         Co-evaluation               AM-PAC PT "6 Clicks" Daily Activity  Outcome Measure Difficulty turning over in bed (including adjusting bedclothes, sheets and blankets)?: A Little Difficulty moving from lying on back to sitting on the side of the bed? : A Little Difficulty sitting down on and standing up from a chair with arms (e.g., wheelchair, bedside commode, etc,.)?: A Little Help needed moving to and from a bed to chair (including a wheelchair)?: A Little Help needed walking in hospital room?: A Little Help needed climbing 3-5 steps with a railing? : A Little 6 Click Score: 18    End of Session Equipment Utilized During Treatment: Gait belt Activity Tolerance: Patient limited by pain Patient left: in chair;with call bell/phone within reach;with chair alarm set;with family/visitor present Nurse Communication: Mobility status;Precautions;Patient requests pain meds;Other (comment) (SCD's not in place) PT Visit Diagnosis: Unsteadiness on feet (R26.81);Other abnormalities of gait and mobility (R26.89);Muscle weakness (generalized) (M62.81)    Time: 0174-9449 PT Time Calculation (min) (ACUTE ONLY): 38 min   Charges:         PT G Codes:        Gwenlyn Found, SPT 08-07-17,10:48  AM [phone]

## 2017-07-28 NOTE — Care Management Note (Signed)
Case Management Note  Patient Details  Name: LEQUISHA CAMMACK MRN: [id number] Date of Birth: Oct 18, 1946  Subjective/Objective:  Discharging today                  Action/Plan: Youth rolling walker ordered from [facility]   Expected Discharge Date:  07/28/17               Expected Discharge Plan:     In-House Referral:     Discharge planning Services  CM Consult  Post Acute Care Choice:  Durable Medical Equipment Choice offered to:  Patient  DME Arranged:  [facility] youth DME Agency:  [facility]  HH Arranged:    [facility] Agency:     Status of Service:  Completed, signed off  If discussed at [employer] of Stay Meetings, dates discussed:    Additional Comments:  Marily Memos, RN 07/28/2017, 10:11 AM

## 2017-07-28 NOTE — Progress Notes (Signed)
Discharge instructions and med details reviewed wit patient. Printed prescription for Oxycodone given to patient. AVS given. IV removed. All questions answered. Patient belongings packed including walker. Patient escorted out via wheelchair.   Stephannie Peters, RN

## 2017-07-28 NOTE — Discharge Instructions (Signed)

## 2017-07-28 NOTE — Progress Notes (Signed)
OT Cancellation Note  Patient Details Name: Judy Bolton MRN: [id number] DOB: May 25, 1946   Cancelled Treatment:    Reason Eval/Treat Not Completed: Other (comment). Order received, chart reviewed. Pt meeting with RN going over discharge paperwork upon attempt. Pt discharging home. Pt unavailable for OT evaluation at this time. Will complete order. Please re-consult if additional OT needs arise.   Richrd Prime, MPH, MS, OTR/L ascom [phone] 07/28/17, 12:09 PM

## 2017-07-28 NOTE — Care Management Obs Status (Signed)
MEDICARE OBSERVATION STATUS NOTIFICATION   Patient Details  Name: Judy Bolton MRN: [id number] Date of Birth: March 04, 1946   Medicare Observation Status Notification Given:  Yes    Marily Memos, RN 07/28/2017, 9:13 AM

## 2017-08-23 ENCOUNTER — Other Ambulatory Visit: Payer: Self-pay | Admitting: Family Medicine

## 2017-08-23 DIAGNOSIS — I10 Essential (primary) hypertension: Secondary | ICD-10-CM

## 2017-08-25 ENCOUNTER — Other Ambulatory Visit: Payer: Self-pay

## 2017-08-30 ENCOUNTER — Encounter: Payer: Self-pay | Admitting: Family Medicine

## 2017-08-30 ENCOUNTER — Ambulatory Visit (INDEPENDENT_AMBULATORY_CARE_PROVIDER_SITE_OTHER): Payer: Medicare Other | Admitting: Family Medicine

## 2017-08-30 VITALS — BP 120/80 | HR 80 | Ht 62.0 in | Wt 140.0 lb

## 2017-08-30 DIAGNOSIS — Z23 Encounter for immunization: Secondary | ICD-10-CM

## 2017-08-30 DIAGNOSIS — E785 Hyperlipidemia, unspecified: Secondary | ICD-10-CM

## 2017-08-30 DIAGNOSIS — K219 Gastro-esophageal reflux disease without esophagitis: Secondary | ICD-10-CM | POA: Insufficient documentation

## 2017-08-30 DIAGNOSIS — R290 Tetany: Secondary | ICD-10-CM

## 2017-08-30 DIAGNOSIS — I10 Essential (primary) hypertension: Secondary | ICD-10-CM | POA: Diagnosis not present

## 2017-08-30 LAB — MICROALBUMIN, URINE

## 2017-08-30 MED ORDER — RANITIDINE HCL 150 MG PO TABS: 300.0000 mg | ORAL_TABLET | Freq: Every day | ORAL | 2 refills | Status: DC | PRN

## 2017-08-30 MED ORDER — METOPROLOL TARTRATE 25 MG PO TABS: 12.5000 mg | ORAL_TABLET | Freq: Two times a day (BID) | ORAL | 2 refills | Status: DC

## 2017-08-30 MED ORDER — GEMFIBROZIL 600 MG PO TABS: 600.0000 mg | ORAL_TABLET | Freq: Two times a day (BID) | ORAL | 2 refills | Status: DC

## 2017-08-30 MED ORDER — ATORVASTATIN CALCIUM 40 MG PO TABS: 40.0000 mg | ORAL_TABLET | Freq: Every day | ORAL | 2 refills | Status: DC

## 2017-08-30 MED ORDER — LOSARTAN POTASSIUM 50 MG PO TABS: 50.0000 mg | ORAL_TABLET | Freq: Two times a day (BID) | ORAL | 2 refills | Status: DC

## 2017-08-30 MED ORDER — HYDROCHLOROTHIAZIDE 25 MG PO TABS: 25.0000 mg | ORAL_TABLET | Freq: Every day | ORAL | 2 refills | Status: DC

## 2017-08-30 NOTE — Progress Notes (Signed)
Name: Judy Bolton   MRN: [id number]    DOB: 1946/11/26   Date:08/30/2017       Progress Note  Subjective  Chief Complaint  Chief Complaint  Patient presents with  . Hypertension  . Hyperlipidemia    Hypertension  This is a chronic problem. The current episode started more than 1 year ago. The problem is unchanged. The problem is controlled. Pertinent negatives include no anxiety, blurred vision, chest pain, headaches, malaise/fatigue, neck pain, orthopnea, palpitations, peripheral edema, PND, shortness of breath or sweats. There are no associated agents to hypertension. Risk factors for coronary artery disease include post-menopausal state, dyslipidemia and diabetes mellitus. Past treatments include angiotensin blockers, beta blockers and diuretics. The current treatment provides moderate improvement. There are no compliance problems.  There is no history of angina, kidney disease, CAD/MI, CVA, heart failure, left ventricular hypertrophy, PVD or retinopathy. There is no history of chronic renal disease, a hypertension causing med or renovascular disease.  Hyperlipidemia  This is a chronic problem. The current episode started more than 1 year ago. The problem is controlled. She has no history of chronic renal disease, diabetes, hypothyroidism, liver disease, obesity or nephrotic syndrome. There are no known factors aggravating her hyperlipidemia. Pertinent negatives include no chest pain, focal sensory loss, focal weakness, leg pain, myalgias or shortness of breath. Current antihyperlipidemic treatment includes statins. The current treatment provides moderate improvement of lipids. There are no compliance problems.  There are no known risk factors for coronary artery disease.  Gastroesophageal Reflux  She reports no abdominal pain, no chest pain, no coughing, no heartburn, no nausea, no sore throat or no wheezing. This is a recurrent problem. The symptoms are aggravated by certain foods.  Pertinent negatives include no melena or weight loss.    No problem-specific Assessment & Plan notes found for this encounter.   Past Medical History:  Diagnosis Date  . Acid reflux   . Arthritis   . Asthma   . Back pain   . Back pain   . COPD (chronic obstructive pulmonary disease) (HCC)   . Diabetes mellitus without complication (HCC)   . Hyperlipidemia   . Hypertension   . Stroke [facility])    March of 2017 vocabulary    Past Surgical History:  Procedure Laterality Date  . ABDOMINAL HYSTERECTOMY    . ANTERIOR (CYSTOCELE) AND POSTERIOR REPAIR (RECTOCELE) WITH XENFORM GRAFT AND SACROSPINOUS FIXATION    . APPENDECTOMY    . CTR Bilateral   . LUMBAR LAMINECTOMY/DECOMPRESSION MICRODISCECTOMY N/A 07/27/2017   Procedure: LUMBAR LAMINECTOMY/DECOMPRESSION MICRODISCECTOMY 3 LEVELS-L3-S1;  Surgeon: Venetia Night, MD;  Location: [facility] ORS;  Service: Neurosurgery;  Laterality: N/A;  . ROTATOR CUFF REPAIR Right     Family History  Problem Relation Age of Onset  . Heart attack Mother   . Cancer Father     Social History   Social History  . Marital status: Widowed    Spouse name: N/A  . Number of children: N/A  . Years of education: N/A   Occupational History  . Not on file.   Social History Main Topics  . Smoking status: Former Smoker    Quit date: 08/14/2000  . Smokeless tobacco: Never Used  . Alcohol use No  . Drug use: No  . Sexual activity: Not Currently   Other Topics Concern  . Not on file   Social History Narrative  . No narrative on file    Allergies  Allergen Reactions  . Codeine Itching  . Latex  Itching and Swelling  . Meloxicam Swelling  . Penicillins Other (See Comments)    Paralysis  Has patient had a PCN reaction causing immediate rash, facial/tongue/throat swelling, SOB or lightheadedness with hypotension: No Has patient had a PCN reaction causing severe rash involving mucus membranes or skin necrosis: No Has patient had a PCN reaction that  required hospitalization Yes Has patient had a PCN reaction occurring within the last 10 years: No If all of the above answers are "NO", then may proceed with Cephalosporin use.    Outpatient Medications Prior to Visit  Medication Sig Dispense Refill  . albuterol (PROVENTIL HFA;VENTOLIN HFA) 108 (90 Base) MCG/ACT inhaler Inhale 2 puffs into the lungs every 6 (six) hours as needed for wheezing or shortness of breath.    Marland Kitchen albuterol (PROVENTIL) (2.5 MG/3ML) 0.083% nebulizer solution Take 3 mLs (2.5 mg total) by nebulization every 6 (six) hours as needed for wheezing or shortness of breath. 75 mL 12  . cetirizine (ZYRTEC) 10 MG tablet Take 10 mg by mouth daily.  1  . metFORMIN (GLUCOPHAGE) 1000 MG tablet Take 1,000 mg by mouth 2 (two) times daily with a meal.    . vitamin B-12 (CYANOCOBALAMIN) 1000 MCG tablet Take 500 mcg by mouth at bedtime.     Marland Kitchen atorvastatin (LIPITOR) 40 MG tablet TAKE 1 TABLET BY MOUTH EVERY DAY 90 tablet 0  . gemfibrozil (LOPID) 600 MG tablet TAKE 1 TABLET BY MOUTH TWICE DAILY 180 tablet 0  . hydrochlorothiazide (HYDRODIURIL) 25 MG tablet TAKE 1 TABLET BY MOUTH EVERY DAY 90 tablet 0  . losartan (COZAAR) 50 MG tablet TAKE 1 TABLET BY MOUTH TWICE DAILY 180 tablet 0  . metoprolol tartrate (LOPRESSOR) 25 MG tablet TAKE 1/2 TABLET BY MOUTH TWICE DAILY 90 tablet 0  . ranitidine (ZANTAC) 150 MG tablet Take 300 mg by mouth daily as needed for heartburn (takes 2 tablets if needed).     . budesonide-formoterol (SYMBICORT) 160-4.5 MCG/ACT inhaler Inhale 2 puffs into the lungs 2 (two) times daily. (Patient not taking: Reported on 07/19/2017) 1 Inhaler 3  . liraglutide (VICTOZA) 18 MG/3ML SOPN Inject 18 mg into the skin daily as needed (FOR ELEVATED BLOOD SUGARS).     Marland Kitchen tiotropium (SPIRIVA) 18 MCG inhalation capsule Place 1 capsule (18 mcg total) into inhaler and inhale daily. (Patient not taking: Reported on 08/30/2017) 30 capsule 11  . losartan (COZAAR) 50 MG tablet TAKE 1 TABLET BY MOUTH  TWICE DAILY. 60 tablet 0  . methocarbamol (ROBAXIN) 500 MG tablet Take 1 tablet (500 mg total) by mouth 4 (four) times daily. 120 tablet 0  . oxyCODONE (OXY IR/ROXICODONE) 5 MG immediate release tablet Take 1-2 tablets (5-10 mg total) by mouth every 3 (three) hours as needed for severe pain or breakthrough pain. 30 tablet 0   Facility-Administered Medications Prior to Visit  Medication Dose Route Frequency Provider Last Rate Last Dose  . ipratropium-albuterol (DUONEB) 0.5-2.5 (3) MG/3ML nebulizer solution 3 mL  3 mL Nebulization Once Duanne Limerick, MD        Review of Systems  Constitutional: Negative for chills, fever, malaise/fatigue and weight loss.  HENT: Negative for ear discharge, ear pain and sore throat.   Eyes: Negative for blurred vision.  Respiratory: Negative for cough, sputum production, shortness of breath and wheezing.   Cardiovascular: Negative for chest pain, palpitations, orthopnea, leg swelling and PND.  Gastrointestinal: Negative for abdominal pain, blood in stool, constipation, diarrhea, heartburn, melena and nausea.  Genitourinary: Negative for dysuria,  frequency, hematuria and urgency.  Musculoskeletal: Negative for back pain, joint pain, myalgias and neck pain.  Skin: Negative for rash.  Neurological: Negative for dizziness, tingling, sensory change, focal weakness and headaches.  Endo/Heme/Allergies: Negative for environmental allergies and polydipsia. Does not bruise/bleed easily.  Psychiatric/Behavioral: Negative for depression and suicidal ideas. The patient is not nervous/anxious and does not have insomnia.      Objective  Vitals:   08/30/17 1007  BP: 120/80  Pulse: 80  Weight: 140 lb (63.5 kg)  Height: 5\' 2"  (1.575 m)    Physical Exam  Constitutional: She is well-developed, well-nourished, and in no distress. No distress.  HENT:  Head: Normocephalic and atraumatic.  Right Ear: External ear normal.  Left Ear: External ear normal.  Nose: Nose  normal.  Mouth/Throat: Oropharynx is clear and moist.  Eyes: Pupils are equal, round, and reactive to light. Conjunctivae and EOM are normal. Right eye exhibits no discharge. Left eye exhibits no discharge.  Neck: Normal range of motion. Neck supple. No JVD present. No thyromegaly present.  Cardiovascular: Normal rate, regular rhythm, normal heart sounds and intact distal pulses.  Exam reveals no gallop and no friction rub.   No murmur heard. Pulmonary/Chest: Effort normal and breath sounds normal. She has no wheezes. She has no rales.  Abdominal: Soft. Bowel sounds are normal. She exhibits no mass. There is no tenderness. There is no guarding.  Musculoskeletal: Normal range of motion. She exhibits no edema.  Lymphadenopathy:    She has no cervical adenopathy.  Neurological: She is alert. She has normal reflexes.  Skin: Skin is warm and dry. She is not diaphoretic.  Psychiatric: Mood and affect normal.  Nursing note and vitals reviewed.     Assessment & Plan  Problem List Items Addressed This Visit      Cardiovascular and Mediastinum   Essential hypertension - Primary   Relevant Medications   losartan (COZAAR) 50 MG tablet   metoprolol tartrate (LOPRESSOR) 25 MG tablet   hydrochlorothiazide (HYDRODIURIL) 25 MG tablet   gemfibrozil (LOPID) 600 MG tablet   atorvastatin (LIPITOR) 40 MG tablet     Digestive   Gastroesophageal reflux disease   Relevant Medications   ranitidine (ZANTAC) 150 MG tablet     Other   Hyperlipidemia   Relevant Medications   losartan (COZAAR) 50 MG tablet   metoprolol tartrate (LOPRESSOR) 25 MG tablet   hydrochlorothiazide (HYDRODIURIL) 25 MG tablet   gemfibrozil (LOPID) 600 MG tablet   atorvastatin (LIPITOR) 40 MG tablet    Other Visit Diagnoses    Need for diphtheria-tetanus-pertussis (Tdap) vaccine       for surgical prophyllaxis   Relevant Orders   Tdap vaccine greater than or equal to 7yo IM (Completed)   Influenza vaccine needed        Relevant Orders   Flu vaccine HIGH DOSE PF (Fluzone High dose) (Completed)   Tetanilla          Meds ordered this encounter  Medications  . losartan (COZAAR) 50 MG tablet    Sig: Take 1 tablet (50 mg total) by mouth 2 (two) times daily.    Dispense:  180 tablet    Refill:  2    **Patient requests 90 days supply**  . metoprolol tartrate (LOPRESSOR) 25 MG tablet    Sig: Take 0.5 tablets (12.5 mg total) by mouth 2 (two) times daily.    Dispense:  90 tablet    Refill:  2  . hydrochlorothiazide (HYDRODIURIL) 25 MG tablet  Sig: Take 1 tablet (25 mg total) by mouth daily.    Dispense:  90 tablet    Refill:  2  . gemfibrozil (LOPID) 600 MG tablet    Sig: Take 1 tablet (600 mg total) by mouth 2 (two) times daily.    Dispense:  180 tablet    Refill:  2  . atorvastatin (LIPITOR) 40 MG tablet    Sig: Take 1 tablet (40 mg total) by mouth daily.    Dispense:  90 tablet    Refill:  2    **Patient requests 90 days supply**  . ranitidine (ZANTAC) 150 MG tablet    Sig: Take 2 tablets (300 mg total) by mouth daily as needed for heartburn (takes 2 tablets if needed).    Dispense:  90 tablet    Refill:  2      Dr. Elizabeth Sauer [facility]  08/30/17

## 2017-09-05 ENCOUNTER — Other Ambulatory Visit: Payer: Self-pay

## 2017-09-06 ENCOUNTER — Ambulatory Visit: Payer: Medicare Other

## 2017-09-08 ENCOUNTER — Ambulatory Visit (INDEPENDENT_AMBULATORY_CARE_PROVIDER_SITE_OTHER): Payer: Medicare Other

## 2017-09-08 DIAGNOSIS — Z23 Encounter for immunization: Secondary | ICD-10-CM | POA: Diagnosis not present

## 2017-10-06 ENCOUNTER — Ambulatory Visit (INDEPENDENT_AMBULATORY_CARE_PROVIDER_SITE_OTHER): Payer: Medicare Other | Admitting: Family Medicine

## 2017-10-06 ENCOUNTER — Encounter: Payer: Self-pay | Admitting: Family Medicine

## 2017-10-06 DIAGNOSIS — H6121 Impacted cerumen, right ear: Secondary | ICD-10-CM

## 2017-10-06 NOTE — Progress Notes (Signed)
Pt saw me for checking her ears after ear washing. Also discussed syringe use.

## 2017-10-11 ENCOUNTER — Ambulatory Visit: Payer: Medicare Other | Admitting: Family Medicine

## 2017-10-14 DIAGNOSIS — R05 Cough: Secondary | ICD-10-CM | POA: Diagnosis not present

## 2017-10-14 DIAGNOSIS — R0602 Shortness of breath: Secondary | ICD-10-CM | POA: Diagnosis not present

## 2017-10-19 ENCOUNTER — Other Ambulatory Visit: Payer: Self-pay

## 2017-10-19 MED ORDER — ETODOLAC ER 500 MG PO TB24: 500.0000 mg | ORAL_TABLET | Freq: Every day | ORAL | 4 refills | Status: DC

## 2017-12-13 DIAGNOSIS — Z794 Long term (current) use of insulin: Secondary | ICD-10-CM | POA: Diagnosis not present

## 2017-12-13 DIAGNOSIS — E781 Pure hyperglyceridemia: Secondary | ICD-10-CM | POA: Diagnosis not present

## 2017-12-13 DIAGNOSIS — Z7952 Long term (current) use of systemic steroids: Secondary | ICD-10-CM | POA: Diagnosis not present

## 2017-12-13 DIAGNOSIS — E1165 Type 2 diabetes mellitus with hyperglycemia: Secondary | ICD-10-CM | POA: Diagnosis not present

## 2017-12-13 DIAGNOSIS — E785 Hyperlipidemia, unspecified: Secondary | ICD-10-CM | POA: Diagnosis not present

## 2017-12-13 DIAGNOSIS — R809 Proteinuria, unspecified: Secondary | ICD-10-CM | POA: Diagnosis not present

## 2017-12-13 DIAGNOSIS — E1129 Type 2 diabetes mellitus with other diabetic kidney complication: Secondary | ICD-10-CM | POA: Diagnosis not present

## 2017-12-29 ENCOUNTER — Encounter: Payer: Self-pay | Admitting: Family Medicine

## 2017-12-29 ENCOUNTER — Ambulatory Visit (INDEPENDENT_AMBULATORY_CARE_PROVIDER_SITE_OTHER): Payer: Medicare HMO | Admitting: Family Medicine

## 2017-12-29 VITALS — BP 130/68 | HR 78 | Ht 62.0 in | Wt 151.0 lb

## 2017-12-29 DIAGNOSIS — J40 Bronchitis, not specified as acute or chronic: Secondary | ICD-10-CM

## 2017-12-29 DIAGNOSIS — J432 Centrilobular emphysema: Secondary | ICD-10-CM

## 2017-12-29 MED ORDER — AZITHROMYCIN 250 MG PO TABS: ORAL_TABLET | ORAL | 0 refills | Status: DC

## 2017-12-29 NOTE — Progress Notes (Signed)
Name: Judy Bolton   MRN: [id number]    DOB: Jan 11, 1946   Date:12/29/2017       Progress Note  Subjective  Chief Complaint  Chief Complaint  Patient presents with  . Wheezing    Wheezing   This is a chronic problem. The current episode started more than 1 year ago. The problem occurs intermittently. The problem has been waxing and waning. Associated symptoms include coughing. Pertinent negatives include no abdominal pain, chest pain, chills, coryza, diarrhea, ear pain, fever, headaches, hemoptysis, neck pain, rash, rhinorrhea, shortness of breath, sore throat, sputum production or swollen glands. The symptoms are aggravated by weather changes. She has tried beta agonist inhalers, steroid inhaler and oral steroids for the symptoms. The treatment provided moderate relief. Her past medical history is significant for COPD.  Cough  This is a recurrent problem. The current episode started in the past 7 days. The problem has been gradually worsening. The cough is non-productive. Associated symptoms include wheezing. Pertinent negatives include no chest pain, chills, ear congestion, ear pain, fever, headaches, heartburn, hemoptysis, myalgias, nasal congestion, postnasal drip, rash, rhinorrhea, sore throat, shortness of breath, sweats or weight loss. The symptoms are aggravated by cold air. She has tried a beta-agonist inhaler, ipratropium inhaler, steroid inhaler and oral steroids for the symptoms. The treatment provided moderate relief. Her past medical history is significant for bronchitis, COPD and emphysema. There is no history of environmental allergies.    No problem-specific Assessment & Plan notes found for this encounter.   Past Medical History:  Diagnosis Date  . Acid reflux   . Arthritis   . Asthma   . Back pain   . Back pain   . COPD (chronic obstructive pulmonary disease) (HCC)   . Diabetes mellitus without complication (HCC)   . Hyperlipidemia   . Hypertension   . Stroke [facility])     March of 2017 vocabulary    Past Surgical History:  Procedure Laterality Date  . ABDOMINAL HYSTERECTOMY    . ANTERIOR (CYSTOCELE) AND POSTERIOR REPAIR (RECTOCELE) WITH XENFORM GRAFT AND SACROSPINOUS FIXATION    . APPENDECTOMY    . CTR Bilateral   . LUMBAR LAMINECTOMY/DECOMPRESSION MICRODISCECTOMY N/A 07/27/2017   Procedure: LUMBAR LAMINECTOMY/DECOMPRESSION MICRODISCECTOMY 3 LEVELS-L3-S1;  Surgeon: Venetia Night, MD;  Location: [facility] ORS;  Service: Neurosurgery;  Laterality: N/A;  . ROTATOR CUFF REPAIR Right     Family History  Problem Relation Age of Onset  . Heart attack Mother   . Cancer Father     Social History   Socioeconomic History  . Marital status: Widowed    Spouse name: Not on file  . Number of children: Not on file  . Years of education: Not on file  . Highest education level: Not on file  Social Needs  . Financial resource strain: Not on file  . Food insecurity - worry: Not on file  . Food insecurity - inability: Not on file  . Transportation needs - medical: Not on file  . Transportation needs - non-medical: Not on file  Occupational History  . Not on file  Tobacco Use  . Smoking status: Former Smoker    Last attempt to quit: 08/14/2000    Years since quitting: 17.3  . Smokeless tobacco: Never Used  Substance and Sexual Activity  . Alcohol use: No  . Drug use: No  . Sexual activity: Not Currently  Other Topics Concern  . Not on file  Social History Narrative  . Not on file  Allergies  Allergen Reactions  . Codeine Itching  . Latex Itching and Swelling  . Meloxicam Swelling  . Penicillins Other (See Comments)    Paralysis  Has patient had a PCN reaction causing immediate rash, facial/tongue/throat swelling, SOB or lightheadedness with hypotension: No Has patient had a PCN reaction causing severe rash involving mucus membranes or skin necrosis: No Has patient had a PCN reaction that required hospitalization Yes Has patient had a PCN  reaction occurring within the last 10 years: No If all of the above answers are "NO", then may proceed with Cephalosporin use.    Outpatient Medications Prior to Visit  Medication Sig Dispense Refill  . albuterol (PROVENTIL HFA;VENTOLIN HFA) 108 (90 Base) MCG/ACT inhaler Inhale 2 puffs into the lungs every 6 (six) hours as needed for wheezing or shortness of breath.    Marland Kitchen albuterol (PROVENTIL) (2.5 MG/3ML) 0.083% nebulizer solution Take 3 mLs (2.5 mg total) by nebulization every 6 (six) hours as needed for wheezing or shortness of breath. 75 mL 12  . atorvastatin (LIPITOR) 40 MG tablet Take 1 tablet (40 mg total) by mouth daily. 90 tablet 2  . cetirizine (ZYRTEC) 10 MG tablet Take 10 mg by mouth daily.  1  . etodolac (LODINE XL) 500 MG 24 hr tablet Take 1 tablet (500 mg total) daily by mouth. 30 tablet 4  . gemfibrozil (LOPID) 600 MG tablet Take 1 tablet (600 mg total) by mouth 2 (two) times daily. 180 tablet 2  . hydrochlorothiazide (HYDRODIURIL) 25 MG tablet Take 1 tablet (25 mg total) by mouth daily. 90 tablet 2  . insulin regular (HUMULIN R) 100 units/mL injection Inject 4-12 Units into the skin 2 (two) times daily.    Marland Kitchen losartan (COZAAR) 50 MG tablet Take 1 tablet (50 mg total) by mouth 2 (two) times daily. 180 tablet 2  . metFORMIN (GLUCOPHAGE) 1000 MG tablet Take 1,000 mg by mouth 2 (two) times daily with a meal.    . metoprolol tartrate (LOPRESSOR) 25 MG tablet Take 0.5 tablets (12.5 mg total) by mouth 2 (two) times daily. (Patient taking differently: Take 25 mg by mouth daily. ) 90 tablet 2  . predniSONE (DELTASONE) 10 MG tablet Take 1 tablet by mouth daily.    . ranitidine (ZANTAC) 150 MG tablet Take 2 tablets (300 mg total) by mouth daily as needed for heartburn (takes 2 tablets if needed). 90 tablet 2  . vitamin B-12 (CYANOCOBALAMIN) 1000 MCG tablet Take 500 mcg by mouth at bedtime.     . budesonide-formoterol (SYMBICORT) 160-4.5 MCG/ACT inhaler Inhale 2 puffs into the lungs 2 (two)  times daily. (Patient not taking: Reported on 07/19/2017) 1 Inhaler 3  . liraglutide (VICTOZA) 18 MG/3ML SOPN Inject 18 mg into the skin daily as needed (FOR ELEVATED BLOOD SUGARS).     Marland Kitchen tiotropium (SPIRIVA) 18 MCG inhalation capsule Place 1 capsule (18 mcg total) into inhaler and inhale daily. (Patient not taking: Reported on 08/30/2017) 30 capsule 11  . etodolac (LODINE) 500 MG tablet Take 1 tablet daily by mouth.     Facility-Administered Medications Prior to Visit  Medication Dose Route Frequency Provider Last Rate Last Dose  . ipratropium-albuterol (DUONEB) 0.5-2.5 (3) MG/3ML nebulizer solution 3 mL  3 mL Nebulization Once Duanne Limerick, MD        Review of Systems  Constitutional: Negative for chills, fever, malaise/fatigue and weight loss.  HENT: Negative for ear discharge, ear pain, postnasal drip, rhinorrhea and sore throat.   Eyes: Negative for  blurred vision.  Respiratory: Positive for cough and wheezing. Negative for hemoptysis, sputum production and shortness of breath.   Cardiovascular: Negative for chest pain, palpitations and leg swelling.  Gastrointestinal: Negative for abdominal pain, blood in stool, constipation, diarrhea, heartburn, melena and nausea.  Genitourinary: Negative for dysuria, frequency, hematuria and urgency.  Musculoskeletal: Negative for back pain, joint pain, myalgias and neck pain.  Skin: Negative for rash.  Neurological: Negative for dizziness, tingling, sensory change, focal weakness and headaches.  Endo/Heme/Allergies: Negative for environmental allergies and polydipsia. Does not bruise/bleed easily.  Psychiatric/Behavioral: Negative for depression and suicidal ideas. The patient is not nervous/anxious and does not have insomnia.      Objective  Vitals:   12/29/17 1416  BP: 130/68  Pulse: 78  SpO2: 98%  Weight: 151 lb (68.5 kg)  Height: 5\' 2"  (1.575 m)    Physical Exam  Constitutional: She is well-developed, well-nourished, and in no  distress. No distress.  HENT:  Head: Normocephalic and atraumatic.  Right Ear: External ear normal.  Left Ear: External ear normal.  Nose: Nose normal.  Mouth/Throat: Oropharynx is clear and moist.  Eyes: Conjunctivae and EOM are normal. Pupils are equal, round, and reactive to light. Right eye exhibits no discharge. Left eye exhibits no discharge.  Neck: Normal range of motion. Neck supple. No JVD present. No thyromegaly present.  Cardiovascular: Normal rate, regular rhythm, normal heart sounds and intact distal pulses. Exam reveals no gallop and no friction rub.  No murmur heard. Pulmonary/Chest: Effort normal. She has wheezes. She has no rales.  Abdominal: Soft. Bowel sounds are normal. She exhibits no mass. There is no tenderness. There is no guarding.  Musculoskeletal: Normal range of motion. She exhibits no edema.  Lymphadenopathy:    She has no cervical adenopathy.  Neurological: She is alert. She has normal reflexes.  Skin: Skin is warm and dry. She is not diaphoretic.  Psychiatric: Mood and affect normal.  Nursing note and vitals reviewed.     Assessment & Plan  Problem List Items Addressed This Visit      Respiratory   Bronchitis   Relevant Medications   azithromycin (ZITHROMAX) 250 MG tablet   Centrilobular emphysema (HCC) - Primary   Relevant Medications   predniSONE (DELTASONE) 10 MG tablet   azithromycin (ZITHROMAX) 250 MG tablet      Meds ordered this encounter  Medications  . azithromycin (ZITHROMAX) 250 MG tablet    Sig: 2 today then 1 a day for 4 days    Dispense:  6 tablet    Refill:  0      Dr. [facility]  12/29/17

## 2018-01-07 ENCOUNTER — Emergency Department: Payer: Medicare HMO

## 2018-01-07 ENCOUNTER — Emergency Department
Admission: EM | Admit: 2018-01-07 | Discharge: 2018-01-07 | Disposition: A | Discharge status: Home / Self Care | Payer: Medicare HMO | Attending: Emergency Medicine | Admitting: Emergency Medicine

## 2018-01-07 ENCOUNTER — Encounter: Payer: Self-pay | Admitting: Emergency Medicine

## 2018-01-07 ENCOUNTER — Other Ambulatory Visit: Payer: Self-pay

## 2018-01-07 DIAGNOSIS — J449 Chronic obstructive pulmonary disease, unspecified: Secondary | ICD-10-CM | POA: Insufficient documentation

## 2018-01-07 DIAGNOSIS — Z87891 Personal history of nicotine dependence: Secondary | ICD-10-CM | POA: Insufficient documentation

## 2018-01-07 DIAGNOSIS — R110 Nausea: Secondary | ICD-10-CM | POA: Diagnosis not present

## 2018-01-07 DIAGNOSIS — R42 Dizziness and giddiness: Secondary | ICD-10-CM | POA: Insufficient documentation

## 2018-01-07 DIAGNOSIS — Z794 Long term (current) use of insulin: Secondary | ICD-10-CM | POA: Insufficient documentation

## 2018-01-07 DIAGNOSIS — R51 Headache: Secondary | ICD-10-CM | POA: Diagnosis not present

## 2018-01-07 DIAGNOSIS — E119 Type 2 diabetes mellitus without complications: Secondary | ICD-10-CM | POA: Diagnosis not present

## 2018-01-07 DIAGNOSIS — Z79899 Other long term (current) drug therapy: Secondary | ICD-10-CM | POA: Diagnosis not present

## 2018-01-07 DIAGNOSIS — I10 Essential (primary) hypertension: Secondary | ICD-10-CM | POA: Diagnosis not present

## 2018-01-07 LAB — COMPREHENSIVE METABOLIC PANEL
ALBUMIN: 4 g/dL (ref 3.5–5.0)
ALT: 14 U/L (ref 14–54)
ANION GAP: 15 (ref 5–15)
AST: 24 U/L (ref 15–41)
Alkaline Phosphatase: 76 U/L (ref 38–126)
BILIRUBIN TOTAL: 0.7 mg/dL (ref 0.3–1.2)
BUN: 36 mg/dL — ABNORMAL HIGH (ref 6–20)
CO2: 19 mmol/L — ABNORMAL LOW (ref 22–32)
Calcium: 9.5 mg/dL (ref 8.9–10.3)
Chloride: 104 mmol/L (ref 101–111)
Creatinine, Ser: 1.24 mg/dL — ABNORMAL HIGH (ref 0.44–1.00)
GFR calc non Af Amer: 43 mL/min — ABNORMAL LOW (ref >60)
GFR, EST AFRICAN AMERICAN: 50 mL/min — AB (ref >60)
GLUCOSE: 99 mg/dL (ref 65–99)
POTASSIUM: 4 mmol/L (ref 3.5–5.1)
Sodium: 138 mmol/L (ref 135–145)
TOTAL PROTEIN: 7.4 g/dL (ref 6.5–8.1)

## 2018-01-07 LAB — TROPONIN I: Troponin I: <0.03 ng/mL (ref <0.03)

## 2018-01-07 LAB — CBC
HEMATOCRIT: 37.6 % (ref 35.0–47.0)
Hemoglobin: 12.6 g/dL (ref 12.0–16.0)
MCH: 28.8 pg (ref 26.0–34.0)
MCHC: 33.6 g/dL (ref 32.0–36.0)
MCV: 85.7 fL (ref 80.0–100.0)
Platelets: 268 10*3/uL (ref 150–440)
RBC: 4.39 MIL/uL (ref 3.80–5.20)
RDW: 15.4 % — AB (ref 11.5–14.5)
WBC: 8.7 10*3/uL (ref 3.6–11.0)

## 2018-01-07 NOTE — ED Triage Notes (Addendum)
Patient to ER for c/o HTN (202 systolic this am at home). +Dizziness since yesterday. Has h/o CVA.  Recently changed from Symbicort to Trelegy.

## 2018-01-07 NOTE — ED Notes (Signed)
Patient transported to CT 

## 2018-01-07 NOTE — ED Provider Notes (Signed)
[facility] Emergency Department Provider Note  Time seen: 8:33 AM  I have reviewed the triage vital signs and the nursing notes.   HISTORY  Chief Complaint Dizziness and Hypertension    HPI Judy Bolton is a 72 y.o. female with a past medical history of COPD, diabetes, hypertension, hyperlipidemia, CVA, presents to the emergency department for elevated blood pressure, chest discomfort and dizziness.  According to the patient last night she was feeling a little dizzy so she checked her blood sugar which was normal and checked her blood pressure which was around 200 systolic.  Patient took her normal nighttime blood pressure medication, states she woke this morning and immediately rechecked her blood pressure remained elevated, she took her morning blood pressure medications and again rechecked her blood pressure, remained elevated around 200 so she came to the emergency department for evaluation.  Did take 1 full strength aspirin prior to coming to the emergency department.  Here she states very minimal headache, took Tylenol this morning for a mild to moderate headache now states that headache is pretty much gone.  He denies any weakness or numbness or confusion.  Patient had a stroke 2 years ago with mild word finding difficulty/speech difficulty, but no other remaining symptoms.  In review of systems the patient does state chest pressure this morning she rates as a 2/10.  States some nausea since last night but denies any vomiting.  Largely negative review of systems otherwise.   Past Medical History:  Diagnosis Date  . Acid reflux   . Arthritis   . Asthma   . Back pain   . Back pain   . COPD (chronic obstructive pulmonary disease) (HCC)   . Diabetes mellitus without complication (HCC)   . Hyperlipidemia   . Hypertension   . Stroke [facility])    March of 2017 vocabulary    Patient Active Problem List   Diagnosis Date Noted  . Essential hypertension 08/30/2017   . Hyperlipidemia 08/30/2017  . Gastroesophageal reflux disease 08/30/2017  . Lumbar stenosis with neurogenic claudication 07/27/2017  . Aortic atherosclerosis (HCC) 05/31/2017  . PAD (peripheral artery disease) (HCC) 05/31/2017  . Type 2 diabetes mellitus without complication, without long-term current use of insulin (HCC) 03/03/2017  . Acute anxiety 04/16/2016  . Bronchitis 04/16/2016  . Centrilobular emphysema (HCC) 04/16/2016  . TIA (transient ischemic attack) 02/29/2016    Past Surgical History:  Procedure Laterality Date  . ABDOMINAL HYSTERECTOMY    . ANTERIOR (CYSTOCELE) AND POSTERIOR REPAIR (RECTOCELE) WITH XENFORM GRAFT AND SACROSPINOUS FIXATION    . APPENDECTOMY    . CTR Bilateral   . LUMBAR LAMINECTOMY/DECOMPRESSION MICRODISCECTOMY N/A 07/27/2017   Procedure: LUMBAR LAMINECTOMY/DECOMPRESSION MICRODISCECTOMY 3 LEVELS-L3-S1;  Surgeon: Venetia Night, MD;  Location: [facility] ORS;  Service: Neurosurgery;  Laterality: N/A;  . ROTATOR CUFF REPAIR Right     Prior to Admission medications   Medication Sig Start Date End Date Taking? Authorizing Provider  albuterol (PROVENTIL HFA;VENTOLIN HFA) 108 (90 Base) MCG/ACT inhaler Inhale 2 puffs into the lungs every 6 (six) hours as needed for wheezing or shortness of breath.    [provider]  albuterol (PROVENTIL) (2.5 MG/3ML) 0.083% nebulizer solution Take 3 mLs (2.5 mg total) by nebulization every 6 (six) hours as needed for wheezing or shortness of breath. 01/01/16   Duanne Limerick, MD  atorvastatin (LIPITOR) 40 MG tablet Take 1 tablet (40 mg total) by mouth daily. 08/30/17   Duanne Limerick, MD  azithromycin (ZITHROMAX) 250  MG tablet 2 today then 1 a day for 4 days 12/29/17   Duanne Limerick, MD  budesonide-formoterol [facility]) 160-4.5 MCG/ACT inhaler Inhale 2 puffs into the lungs 2 (two) times daily. Patient not taking: Reported on 07/19/2017 01/01/16   Duanne Limerick, MD  cetirizine (ZYRTEC) 10 MG tablet Take 10 mg by mouth  daily. 01/22/16   [provider]  etodolac (LODINE XL) 500 MG 24 hr tablet Take 1 tablet (500 mg total) daily by mouth. 10/19/17   Duanne Limerick, MD  gemfibrozil (LOPID) 600 MG tablet Take 1 tablet (600 mg total) by mouth 2 (two) times daily. 08/30/17   Duanne Limerick, MD  hydrochlorothiazide (HYDRODIURIL) 25 MG tablet Take 1 tablet (25 mg total) by mouth daily. 08/30/17   Duanne Limerick, MD  insulin regular (HUMULIN R) 100 units/mL injection Inject 4-12 Units into the skin 2 (two) times daily.    [provider]  liraglutide (VICTOZA) 18 MG/3ML SOPN Inject 18 mg into the skin daily as needed (FOR ELEVATED BLOOD SUGARS).  06/13/17   [provider]  losartan (COZAAR) 50 MG tablet Take 1 tablet (50 mg total) by mouth 2 (two) times daily. 08/30/17   Duanne Limerick, MD  metFORMIN (GLUCOPHAGE) 1000 MG tablet Take 1,000 mg by mouth 2 (two) times daily with a meal.    [provider]  metoprolol tartrate (LOPRESSOR) 25 MG tablet Take 0.5 tablets (12.5 mg total) by mouth 2 (two) times daily. Patient taking differently: Take 25 mg by mouth daily.  08/30/17   Duanne Limerick, MD  predniSONE (DELTASONE) 10 MG tablet Take 1 tablet by mouth daily. 10/14/17   [provider]  ranitidine (ZANTAC) 150 MG tablet Take 2 tablets (300 mg total) by mouth daily as needed for heartburn (takes 2 tablets if needed). 08/30/17   Duanne Limerick, MD  tiotropium (SPIRIVA) 18 MCG inhalation capsule Place 1 capsule (18 mcg total) into inhaler and inhale daily. Patient not taking: Reported on 08/30/2017 12/29/16   Duanne Limerick, MD  vitamin B-12 (CYANOCOBALAMIN) 1000 MCG tablet Take 500 mcg by mouth at bedtime.     [provider]    Allergies  Allergen Reactions  . Codeine Itching  . Latex Itching and Swelling  . Meloxicam Swelling  . Penicillins Other (See Comments)    Paralysis  Has patient had a PCN reaction causing immediate rash, facial/tongue/throat swelling, SOB or  lightheadedness with hypotension: No Has patient had a PCN reaction causing severe rash involving mucus membranes or skin necrosis: No Has patient had a PCN reaction that required hospitalization Yes Has patient had a PCN reaction occurring within the last 10 years: No If all of the above answers are "NO", then may proceed with Cephalosporin use.    Family History  Problem Relation Age of Onset  . Heart attack Mother   . Cancer Father     Social History Social History   Tobacco Use  . Smoking status: Former Smoker    Last attempt to quit: 08/14/2000    Years since quitting: 17.4  . Smokeless tobacco: Never Used  Substance Use Topics  . Alcohol use: No  . Drug use: No    Review of Systems Constitutional: Positive for mild dizziness, improved from earlier per patient. Eyes: Negative for visual complaints ENT: Negative for congestion Cardiovascular: States mild chest pain 2/10. Respiratory: Negative for shortness of breath. Gastrointestinal: Negative for abdominal pain.  Positive for nausea but negative for  vomiting Genitourinary: Negative for urinary compaints Musculoskeletal: Negative for musculoskeletal complaints Skin: Negative for skin complaints  Neurological: Moderate headache this morning, now gone after Tylenol. All other ROS negative  ____________________________________________   PHYSICAL EXAM:  VITAL SIGNS: ED Triage Vitals [01/07/18 0809]  Enc Vitals Group     BP (!) 174/79     Pulse Rate 84     Resp 18     Temp 97.7 F (36.5 C)     Temp Source Oral     SpO2 99 %     Weight 151 lb (68.5 kg)     Height 5\' 2"  (1.575 m)     Head Circumference      Peak Flow      Pain Score 4     Pain Loc      Pain Edu?      Excl. in GC?    Constitutional: Alert and oriented. Well appearing and in no distress. Eyes: Normal exam ENT   Head: Normocephalic and atraumatic   Mouth/Throat: Mucous membranes are moist. Cardiovascular: Normal rate, regular  rhythm. Respiratory: Normal respiratory effort without tachypnea nor retractions. Breath sounds are clear  Gastrointestinal: Soft and nontender. No distention.   Musculoskeletal: Nontender with normal range of motion in all extremities. No lower extremity tenderness  Neurologic:  Normal speech and language. No gross focal neurologic deficits Skin:  Skin is warm, dry and intact.  Psychiatric: Mood and affect are normal.   ____________________________________________    EKG  EKG reviewed and interpreted by myself shows normal sinus rhythm at 76 bpm with a narrow QRS, normal axis, normal intervals, no concerning ST changes.  ____________________________________________    RADIOLOGY  CT scan of the head is normal.  ____________________________________________   INITIAL IMPRESSION / ASSESSMENT AND PLAN / ED COURSE  Pertinent labs & imaging results that were available during my care of the patient were reviewed by me and considered in my medical decision making (see chart for details).  Patient presents to the emergency department with elevated blood pressure dizziness and chest discomfort.  Differential would include hypertension, ACS, angina, ICH, anxiety.  Overall the patient appears well, no distress.  We will check labs including cardiac panel, and CT scan of the head.  We will continue to closely monitor in the emergency department.  Current blood pressures 202 systolic.  Patient CT scan is normal.  Labs are resulted largely within normal limits mild renal insufficiency although unchanged from past lab work 5 months ago per record review.  Blood pressure currently 172/81.  Patient appears very well currently.  Discussed with the patient PCP follow-up and return precautions, she is agreeable to this plan of care.  ____________________________________________   FINAL CLINICAL IMPRESSION(S) / ED DIAGNOSES  Hypertension Dizziness    , MD 01/07/18 [phone]

## 2018-01-09 ENCOUNTER — Encounter: Payer: Self-pay | Admitting: Family Medicine

## 2018-01-09 ENCOUNTER — Ambulatory Visit (INDEPENDENT_AMBULATORY_CARE_PROVIDER_SITE_OTHER): Payer: Medicare HMO | Admitting: Family Medicine

## 2018-01-09 VITALS — BP 130/74 | HR 80 | Ht 62.0 in | Wt 145.0 lb

## 2018-01-09 DIAGNOSIS — F410 Panic disorder [episodic paroxysmal anxiety] without agoraphobia: Secondary | ICD-10-CM

## 2018-01-09 DIAGNOSIS — R69 Illness, unspecified: Secondary | ICD-10-CM | POA: Diagnosis not present

## 2018-01-09 DIAGNOSIS — I10 Essential (primary) hypertension: Secondary | ICD-10-CM

## 2018-01-09 MED ORDER — METOPROLOL TARTRATE 25 MG PO TABS: 25.0000 mg | ORAL_TABLET | Freq: Two times a day (BID) | ORAL | 5 refills | Status: DC

## 2018-01-09 MED ORDER — ALPRAZOLAM 0.25 MG PO TABS: 0.2500 mg | ORAL_TABLET | Freq: Once | ORAL | 0 refills | Status: DC | PRN

## 2018-01-09 NOTE — Progress Notes (Signed)
Name: Judy Bolton   MRN: [id number]    DOB: 09-28-1946   Date:01/09/2018       Progress Note  Subjective  Chief Complaint  Chief Complaint  Patient presents with  . Hypertension    started Friday running in the 200s/100. took extra losartan. got up Sat- high again so went to the ER. Has hx of stroke, as of yesterday B/P was still 200s/100s. She took Xanax that was left over and this morning B/P was 161/82.    Hypertension  This is a chronic problem. The current episode started more than 1 year ago. The problem has been waxing and waning since onset. The problem is controlled. Pertinent negatives include no anxiety, blurred vision, chest pain, headaches, malaise/fatigue, neck pain, orthopnea, palpitations, peripheral edema, PND, shortness of breath or sweats. There are no associated agents to hypertension. There are no known risk factors for coronary artery disease. Past treatments include diuretics, beta blockers and angiotensin blockers. The current treatment provides moderate improvement. There are no compliance problems.  There is no history of angina, kidney disease, CAD/MI, CVA, heart failure, left ventricular hypertrophy, PVD or retinopathy. There is no history of chronic renal disease, a hypertension causing med or renovascular disease.  Anxiety  Presents for follow-up visit. Symptoms include nervous/anxious behavior. Patient reports no chest pain, confusion, dizziness, excessive worry, feeling of choking, insomnia, irritability, nausea, palpitations, shortness of breath or suicidal ideas. Primary symptoms comment: elevated bp. Symptoms occur occasionally. The severity of symptoms is moderate.      No problem-specific Assessment & Plan notes found for this encounter.   Past Medical History:  Diagnosis Date  . Acid reflux   . Arthritis   . Asthma   . Back pain   . Back pain   . COPD (chronic obstructive pulmonary disease) (HCC)   . Diabetes mellitus without complication (HCC)    . Hyperlipidemia   . Hypertension   . Stroke [facility])    March of 2017 vocabulary    Past Surgical History:  Procedure Laterality Date  . ABDOMINAL HYSTERECTOMY    . ANTERIOR (CYSTOCELE) AND POSTERIOR REPAIR (RECTOCELE) WITH XENFORM GRAFT AND SACROSPINOUS FIXATION    . APPENDECTOMY    . CTR Bilateral   . LUMBAR LAMINECTOMY/DECOMPRESSION MICRODISCECTOMY N/A 07/27/2017   Procedure: LUMBAR LAMINECTOMY/DECOMPRESSION MICRODISCECTOMY 3 LEVELS-L3-S1;  Surgeon: Venetia Night, MD;  Location: [facility] ORS;  Service: Neurosurgery;  Laterality: N/A;  . ROTATOR CUFF REPAIR Right     Family History  Problem Relation Age of Onset  . Heart attack Mother   . Cancer Father     Social History   Socioeconomic History  . Marital status: Widowed    Spouse name: Not on file  . Number of children: Not on file  . Years of education: Not on file  . Highest education level: Not on file  Social Needs  . Financial resource strain: Not on file  . Food insecurity - worry: Not on file  . Food insecurity - inability: Not on file  . Transportation needs - medical: Not on file  . Transportation needs - non-medical: Not on file  Occupational History  . Not on file  Tobacco Use  . Smoking status: Former Smoker    Last attempt to quit: 08/14/2000    Years since quitting: 17.4  . Smokeless tobacco: Never Used  Substance and Sexual Activity  . Alcohol use: No  . Drug use: No  . Sexual activity: Not Currently  Other Topics Concern  .  Not on file  Social History Narrative  . Not on file    Allergies  Allergen Reactions  . Codeine Itching  . Latex Itching and Swelling  . Meloxicam Swelling  . Penicillins Other (See Comments)    Paralysis  Has patient had a PCN reaction causing immediate rash, facial/tongue/throat swelling, SOB or lightheadedness with hypotension: No Has patient had a PCN reaction causing severe rash involving mucus membranes or skin necrosis: No Has patient had a PCN reaction that  required hospitalization Yes Has patient had a PCN reaction occurring within the last 10 years: No If all of the above answers are "NO", then may proceed with Cephalosporin use.    Outpatient Medications Prior to Visit  Medication Sig Dispense Refill  . albuterol (PROVENTIL HFA;VENTOLIN HFA) 108 (90 Base) MCG/ACT inhaler Inhale 2 puffs into the lungs every 6 (six) hours as needed for wheezing or shortness of breath.    Marland Kitchen albuterol (PROVENTIL) (2.5 MG/3ML) 0.083% nebulizer solution Take 3 mLs (2.5 mg total) by nebulization every 6 (six) hours as needed for wheezing or shortness of breath. 75 mL 12  . atorvastatin (LIPITOR) 40 MG tablet Take 1 tablet (40 mg total) by mouth daily. 90 tablet 2  . fexofenadine (ALLEGRA) 180 MG tablet Take 180 mg by mouth daily.    . Fluticasone-Umeclidin-Vilant (TRELEGY ELLIPTA) 100-62.5-25 MCG/INH AEPB Inhale 1 puff into the lungs daily.    Marland Kitchen gemfibrozil (LOPID) 600 MG tablet Take 1 tablet (600 mg total) by mouth 2 (two) times daily. 180 tablet 2  . hydrochlorothiazide (HYDRODIURIL) 25 MG tablet Take 1 tablet (25 mg total) by mouth daily. 90 tablet 2  . liraglutide (VICTOZA) 18 MG/3ML SOPN Inject 18 mg into the skin daily as needed (FOR ELEVATED BLOOD SUGARS).     Marland Kitchen losartan (COZAAR) 50 MG tablet Take 1 tablet (50 mg total) by mouth 2 (two) times daily. 180 tablet 2  . metFORMIN (GLUCOPHAGE) 1000 MG tablet Take 1,000 mg by mouth 2 (two) times daily with a meal.    . predniSONE (DELTASONE) 10 MG tablet Take 0.5 tablets by mouth daily.     . ranitidine (ZANTAC) 150 MG tablet Take 2 tablets (300 mg total) by mouth daily as needed for heartburn (takes 2 tablets if needed). 90 tablet 2  . cetirizine (ZYRTEC) 10 MG tablet Take 10 mg by mouth daily.  1  . metoprolol tartrate (LOPRESSOR) 25 MG tablet Take 0.5 tablets (12.5 mg total) by mouth 2 (two) times daily. (Patient taking differently: Take 25 mg by mouth daily. ) 90 tablet 2  . budesonide-formoterol (SYMBICORT)  160-4.5 MCG/ACT inhaler Inhale 2 puffs into the lungs 2 (two) times daily. (Patient not taking: Reported on 07/19/2017) 1 Inhaler 3  . etodolac (LODINE XL) 500 MG 24 hr tablet Take 1 tablet (500 mg total) daily by mouth. (Patient not taking: Reported on 01/09/2018) 30 tablet 4  . insulin regular (HUMULIN R) 100 units/mL injection Inject 4-12 Units into the skin 2 (two) times daily.    . vitamin B-12 (CYANOCOBALAMIN) 1000 MCG tablet Take 500 mcg by mouth at bedtime.     Marland Kitchen azithromycin (ZITHROMAX) 250 MG tablet 2 today then 1 a day for 4 days 6 tablet 0  . tiotropium (SPIRIVA) 18 MCG inhalation capsule Place 1 capsule (18 mcg total) into inhaler and inhale daily. (Patient not taking: Reported on 08/30/2017) 30 capsule 11   Facility-Administered Medications Prior to Visit  Medication Dose Route Frequency Provider Last Rate Last Dose  .  ipratropium-albuterol (DUONEB) 0.5-2.5 (3) MG/3ML nebulizer solution 3 mL  3 mL Nebulization Once Duanne Limerick, MD        Review of Systems  Constitutional: Negative for chills, fever, irritability, malaise/fatigue and weight loss.  HENT: Negative for ear discharge, ear pain and sore throat.   Eyes: Negative for blurred vision.  Respiratory: Negative for cough, sputum production, shortness of breath and wheezing.   Cardiovascular: Negative for chest pain, palpitations, orthopnea, leg swelling and PND.  Gastrointestinal: Negative for abdominal pain, blood in stool, constipation, diarrhea, heartburn, melena and nausea.  Genitourinary: Negative for dysuria, frequency, hematuria and urgency.  Musculoskeletal: Negative for back pain, joint pain, myalgias and neck pain.  Skin: Negative for rash.  Neurological: Negative for dizziness, tingling, sensory change, focal weakness and headaches.  Endo/Heme/Allergies: Negative for environmental allergies and polydipsia. Does not bruise/bleed easily.  Psychiatric/Behavioral: Negative for confusion, depression and suicidal ideas.  The patient is nervous/anxious. The patient does not have insomnia.      Objective  Vitals:   01/09/18 1005  BP: 130/74  Pulse: 80  Weight: 145 lb (65.8 kg)  Height: 5\' 2"  (1.575 m)    Physical Exam  Constitutional: She is well-developed, well-nourished, and in no distress. No distress.  HENT:  Head: Normocephalic and atraumatic.  Right Ear: External ear normal.  Left Ear: External ear normal.  Nose: Nose normal.  Mouth/Throat: Oropharynx is clear and moist.  Eyes: Conjunctivae and EOM are normal. Pupils are equal, round, and reactive to light. Right eye exhibits no discharge. Left eye exhibits no discharge.  Neck: Normal range of motion. Neck supple. No JVD present. No thyromegaly present.  Cardiovascular: Normal rate, regular rhythm, normal heart sounds and intact distal pulses. Exam reveals no gallop and no friction rub.  No murmur heard. Pulmonary/Chest: Effort normal and breath sounds normal. She has no wheezes. She has no rales.  Abdominal: Soft. Bowel sounds are normal. She exhibits no mass. There is no tenderness. There is no guarding.  Musculoskeletal: Normal range of motion. She exhibits no edema.  Lymphadenopathy:    She has no cervical adenopathy.  Neurological: She is alert. She has normal reflexes.  Skin: Skin is warm and dry. She is not diaphoretic.  Psychiatric: Mood and affect normal.  Nursing note and vitals reviewed.     Assessment & Plan  Problem List Items Addressed This Visit      Cardiovascular and Mediastinum   Essential hypertension - Primary   Relevant Medications   metoprolol tartrate (LOPRESSOR) 25 MG tablet    Other Visit Diagnoses    Panic disorder       Relevant Medications   ALPRAZolam (XANAX) 0.25 MG tablet      Meds ordered this encounter  Medications  . metoprolol tartrate (LOPRESSOR) 25 MG tablet    Sig: Take 1 tablet (25 mg total) by mouth 2 (two) times daily.    Dispense:  60 tablet    Refill:  5  . ALPRAZolam (XANAX)  0.25 MG tablet    Sig: Take 1 tablet (0.25 mg total) by mouth once as needed for up to 1 dose for anxiety.    Dispense:  12 tablet    Refill:  0      Dr. [facility]  01/09/18

## 2018-01-10 DIAGNOSIS — R0602 Shortness of breath: Secondary | ICD-10-CM | POA: Diagnosis not present

## 2018-01-10 DIAGNOSIS — R05 Cough: Secondary | ICD-10-CM | POA: Diagnosis not present

## 2018-01-17 DIAGNOSIS — E1165 Type 2 diabetes mellitus with hyperglycemia: Secondary | ICD-10-CM | POA: Diagnosis not present

## 2018-01-18 LAB — HM DIABETES EYE EXAM

## 2018-01-20 ENCOUNTER — Other Ambulatory Visit: Payer: Self-pay

## 2018-01-23 ENCOUNTER — Ambulatory Visit (INDEPENDENT_AMBULATORY_CARE_PROVIDER_SITE_OTHER): Payer: Medicare HMO

## 2018-01-23 VITALS — BP 142/78 | HR 70 | Temp 98.3°F | Resp 12 | Ht 62.0 in | Wt 146.4 lb

## 2018-01-23 DIAGNOSIS — Z1239 Encounter for other screening for malignant neoplasm of breast: Secondary | ICD-10-CM

## 2018-01-23 DIAGNOSIS — Z1159 Encounter for screening for other viral diseases: Secondary | ICD-10-CM

## 2018-01-23 DIAGNOSIS — Z1231 Encounter for screening mammogram for malignant neoplasm of breast: Secondary | ICD-10-CM

## 2018-01-23 DIAGNOSIS — Z0000 Encounter for general adult medical examination without abnormal findings: Secondary | ICD-10-CM

## 2018-01-23 NOTE — Progress Notes (Signed)
Subjective:   Judy Bolton is a 72 y.o. female who presents for Medicare Annual (Subsequent) preventive examination.  Review of Systems:  N/A Cardiac Risk Factors include: advanced age (>13men, >49 women);diabetes mellitus;hypertension;dyslipidemia;sedentary lifestyle     Objective:     Vitals: BP (!) 142/78 (BP Location: Right Arm, Patient Position: Sitting, Cuff Size: Normal)   Pulse 70   Temp 98.3 F (36.8 C) (Oral)   Resp 12   Ht 5\' 2"  (1.575 m)   Wt 146 lb 6.4 oz (66.4 kg)   BMI 26.78 kg/m   Body mass index is 26.78 kg/m.  Advanced Directives 01/23/2018 01/07/2018 07/27/2017 07/27/2017 07/21/2017 05/31/2017 03/03/2017  Does Patient Have a Medical Advance Directive? Yes Yes Yes Yes Yes Yes Yes  Type of Advance Directive Living will Living will Healthcare Power of [place];Living will Healthcare Power of [place];Living will Healthcare Power of [place];Living will Healthcare Power of [place];Living will Living will  Does patient want to make changes to medical advance directive? Yes (MAU/Ambulatory/Procedural Areas - Information given) - No - Patient declined No - Patient declined - - -  Copy of Healthcare Power of Attorney in Chart? - - No - copy requested Yes - - -  Would patient like information on creating a medical advance directive? - No - Patient declined - - - - -    Tobacco Social History   Tobacco Use  Smoking Status Former Smoker  . Packs/day: 1.50  . Years: 28.00  . Pack years: 42.00  . Types: Cigarettes  . Last attempt to quit: 08/14/2000  . Years since quitting: 17.4  Smokeless Tobacco Never Used  Tobacco Comment   smoking cessatiuon materials not required     Counseling given: No Comment: smoking cessatiuon materials not required   Clinical Intake:  Pre-visit preparation completed: Yes  Pain : No/denies pain     BMI - recorded: 26.78 Nutritional Status: BMI 25 -29 Overweight Nutritional Risks: None Diabetes: Yes CBG done?: No Did pt.  bring in CBG monitor from home?: No  How often do you need to have someone help you when you read instructions, pamphlets, or other written materials from your doctor or pharmacy?: 1 - Never  Interpreter Needed?: No  Information entered by :: AEversole, LPN  Past Medical History:  Diagnosis Date  . Acid reflux   . Arthritis   . Asthma   . Back pain   . Back pain   . COPD (chronic obstructive pulmonary disease) (HCC)   . Diabetes mellitus without complication (HCC)   . Hyperlipidemia   . Hypertension   . Stroke [facility])    March of 2017 vocabulary   Past Surgical History:  Procedure Laterality Date  . ABDOMINAL HYSTERECTOMY    . ANTERIOR (CYSTOCELE) AND POSTERIOR REPAIR (RECTOCELE) WITH XENFORM GRAFT AND SACROSPINOUS FIXATION    . APPENDECTOMY    . CTR Bilateral   . LUMBAR LAMINECTOMY/DECOMPRESSION MICRODISCECTOMY N/A 07/27/2017   Procedure: LUMBAR LAMINECTOMY/DECOMPRESSION MICRODISCECTOMY 3 LEVELS-L3-S1;  Surgeon: Venetia Night, MD;  Location: [facility] ORS;  Service: Neurosurgery;  Laterality: N/A;  . ROTATOR CUFF REPAIR Right    Family History  Problem Relation Age of Onset  . Heart attack Mother   . Cancer Father   . Heart attack Maternal Grandmother   . Heart attack Maternal Grandfather   . Breast cancer Paternal Grandmother    Social History   Socioeconomic History  . Marital status: Widowed    Spouse name: None  . Number of children: 1  .  Years of education: some college  . Highest education level: 12th grade  Social Needs  . Financial resource strain: Not hard at all  . Food insecurity - worry: Never true  . Food insecurity - inability: Never true  . Transportation needs - medical: No  . Transportation needs - non-medical: No  Occupational History  . Occupation: Retired  Tobacco Use  . Smoking status: Former Smoker    Packs/day: 1.50    Years: 28.00    Pack years: 42.00    Types: Cigarettes    Last attempt to quit: 08/14/2000    Years since quitting:  17.4  . Smokeless tobacco: Never Used  . Tobacco comment: smoking cessatiuon materials not required  Substance and Sexual Activity  . Alcohol use: No  . Drug use: No  . Sexual activity: Not Currently  Other Topics Concern  . None  Social History Narrative  . None    Outpatient Encounter Medications as of 01/23/2018  Medication Sig  . albuterol (PROVENTIL HFA;VENTOLIN HFA) 108 (90 Base) MCG/ACT inhaler Inhale 2 puffs into the lungs every 6 (six) hours as needed for wheezing or shortness of breath.  Marland Kitchen albuterol (PROVENTIL) (2.5 MG/3ML) 0.083% nebulizer solution Take 3 mLs (2.5 mg total) by nebulization every 6 (six) hours as needed for wheezing or shortness of breath.  . ALPRAZolam (XANAX) 0.25 MG tablet Take 1 tablet (0.25 mg total) by mouth once as needed for up to 1 dose for anxiety.  Marland Kitchen atorvastatin (LIPITOR) 40 MG tablet Take 1 tablet (40 mg total) by mouth daily.  . budesonide-formoterol (SYMBICORT) 160-4.5 MCG/ACT inhaler Inhale 2 puffs into the lungs 2 (two) times daily.  Marland Kitchen etodolac (LODINE XL) 500 MG 24 hr tablet Take 1 tablet (500 mg total) daily by mouth.  . fexofenadine (ALLEGRA) 180 MG tablet Take 180 mg by mouth daily.  . Fluticasone-Umeclidin-Vilant (TRELEGY ELLIPTA) 100-62.5-25 MCG/INH AEPB Inhale 1 puff into the lungs daily.  Marland Kitchen gemfibrozil (LOPID) 600 MG tablet Take 1 tablet (600 mg total) by mouth 2 (two) times daily.  . hydrochlorothiazide (HYDRODIURIL) 25 MG tablet Take 1 tablet (25 mg total) by mouth daily.  . insulin regular (HUMULIN R) 100 units/mL injection Inject 4-12 Units into the skin 2 (two) times daily.  Marland Kitchen liraglutide (VICTOZA) 18 MG/3ML SOPN Inject 18 mg into the skin daily as needed (FOR ELEVATED BLOOD SUGARS).   . metFORMIN (GLUCOPHAGE) 1000 MG tablet Take 1,000 mg by mouth 2 (two) times daily with a meal.  . metoprolol tartrate (LOPRESSOR) 25 MG tablet Take 1 tablet (25 mg total) by mouth 2 (two) times daily.  . predniSONE (DELTASONE) 10 MG tablet Take 0.5  tablets by mouth daily.   . ranitidine (ZANTAC) 150 MG tablet Take 2 tablets (300 mg total) by mouth daily as needed for heartburn (takes 2 tablets if needed).  . vitamin B-12 (CYANOCOBALAMIN) 1000 MCG tablet Take 500 mcg by mouth at bedtime.   Marland Kitchen losartan (COZAAR) 50 MG tablet Take 1 tablet (50 mg total) by mouth 2 (two) times daily.   Facility-Administered Encounter Medications as of 01/23/2018  Medication  . ipratropium-albuterol (DUONEB) 0.5-2.5 (3) MG/3ML nebulizer solution 3 mL    Activities of Daily Living In your present state of health, do you have any difficulty performing the following activities: 01/23/2018 07/27/2017  Hearing? N N  Comment denies wearing hearing aids -  Vision? N N  Comment wears eyeglasses -  Difficulty concentrating or making decisions? Y N  Comment short term memory loss -  Walking or climbing stairs? Y N  Comment dyspnea, back pain, joint pain -  Dressing or bathing? N N  Doing errands, shopping? N N  Preparing Food and eating ? N -  Comment denies wearing dentures -  Using the Toilet? N -  In the past six months, have you accidently leaked urine? N -  Do you have problems with loss of bowel control? N -  Managing your Medications? N -  Managing your Finances? N -  Housekeeping or managing your Housekeeping? N -  Some recent data might be hidden    Patient Care Team: Duanne Limerick, MD as PCP - General (Family Medicine) Mertie Moores, MD as Consulting Physician (Specialist)    Assessment:   This is a routine wellness examination for [place].  Exercise Activities and Dietary recommendations Current Exercise Habits: The patient does not participate in regular exercise at present, Exercise limited by: None identified  Goals    . DIET - INCREASE WATER INTAKE     Recommend to drink at least 6-8 8oz glasses of water per day.       Fall Risk Fall Risk  01/23/2018 06/23/2017 04/16/2016 01/23/2016 01/01/2016  Falls in the past year? No No No No  Yes  Number falls in past yr: - - - - 1  Risk for fall due to : History of fall(s);Impaired balance/gait;Impaired vision;Medication side effect - - - -  Risk for fall due to: Comment back pain, wears eyeglasses - - - -  Follow up - - - - Falls evaluation completed   Is the patient's home free of loose throw rugs in walkways, pet beds, electrical cords, etc?   Yes Does the patient have any grab bars in the bathroom? No  Does the patient use a shower chair when bathing? No Does the patient have any stairs in or around the home? Yes If so, are there any handrails?  Yes Does the patient have adequate lighting?  Yes Does the patient use a cane, walker or w/c? No Does the patient use of an elevated toilet seat? Yes  Timed Get Up and Go Performed: Yes. Pt ambulated 10 feet within 10 sec. Gait stead-fast and without the use of an assistive device. No intervention required at this time. Fall risk prevention has been discussed.  Pt declined my offer to send Community Resource Referral to Care Guide for installation of grab bars in the shower and shower chair.  Depression Screen PHQ 2/9 Scores 01/23/2018 01/23/2018 06/23/2017 04/16/2016  PHQ - 2 Score 0 0 0 0  PHQ- 9 Score 0 - - -     Cognitive Function     6CIT Screen 01/23/2018  What Year? 0 points  What month? 0 points  What time? 0 points  Count back from 20 0 points  Months in reverse 0 points  Repeat phrase 4 points  Total Score 4    Immunization History  Administered Date(s) Administered  . Influenza, High Dose Seasonal PF 08/30/2017  . Influenza,inj,Quad PF,6+ Mos 09/01/2015, 09/09/2016  . Pneumococcal Conjugate-13 09/01/2015  . Pneumococcal Polysaccharide-23 09/08/2017  . Tdap 08/30/2017    Qualifies for Shingles Vaccine? Yes. Due for Zostavax or Shingrix vaccine. Education has been provided regarding the importance of this vaccine. Pt has been advised to call her insurance company to determine her out of pocket expense.  Advised she may also receive this vaccine at her local pharmacy or Health Dept. Verbalized acceptance and understanding.  Screening Tests Health Maintenance  Topic Date Due  . Hepatitis C Screening  08/20/1946  . MAMMOGRAM  04/17/2015  . HEMOGLOBIN A1C  09/01/2016  . FOOT EXAM  12/29/2018  . OPHTHALMOLOGY EXAM  01/18/2019  . COLONOSCOPY  10/29/2024  . TETANUS/TDAP  08/31/2027  . INFLUENZA VACCINE  Completed  . DEXA SCAN  Completed  . PNA vac Low Risk Adult  Completed    Cancer Screenings: Lung: Low Dose CT Chest recommended if Age 46-80 years, 30 pack-year currently smoking OR have quit w/in 15years. Patient does qualify. An Epic message has been sent to Glenna Fellows, RN (Oncology Nurse Navigator) regarding the possible need for this exam. Ines Bloomer will review the patient's chart to determine if the patient truly qualifies for the exam. If the patient qualifies, Ines Bloomer will order the Low Dose CT of the chest to facilitate the scheduling of this exam. Breast:  Up to date on Mammogram? No. Last mammogram completed 04/16/14. Ordered on 07/11/17 but not completed. Mammogram reordered today. Message sent to Referral Coordinator for scheduling purposes.   Up to date of Bone Density/Dexa? Yes. Completed 11/07/16. Osteoporotic screenings no longer required. Colorectal: Completed colonoscopy 10/29/14. Repeat every 10 years.  Additional Screenings: Hepatitis B/HIV/Syphillis: Does not qualify Hepatitis C Screening: Ordered today      Plan:  I have personally reviewed and addressed the Medicare Annual Wellness questionnaire and have noted the following in the patient's chart:  A. Medical and social history B. Use of alcohol, tobacco or illicit drugs  C. Current medications and supplements D. Functional ability and status E.  Nutritional status F.  Physical activity G. Advance directives H. List of other physicians I.  Hospitalizations, surgeries, and ER visits in previous 12 months J.   Vitals K. Screenings such as hearing and vision if needed, cognitive and depression L. Referrals and appointments - none  In addition, I have reviewed and discussed with patient certain preventive protocols, quality metrics, and best practice recommendations. A written personalized care plan for preventive services as well as general preventive health recommendations were provided to patient.  Signed,  Deon Pilling, LPN Nurse Health Advisor  MD Recommendations: Due for Zostavax or Shingrix vaccine. Education has been provided regarding the importance of this vaccine. Pt has been advised to call her insurance company to determine her out of pocket expense. Advised she may also receive this vaccine at her local pharmacy or Health Dept. Verbalized acceptance and understanding.  Due for diabetic foot exam. Scheduled for CPE on 01/24/18.  Due for Mammogram. Last mammogram completed 04/16/14. Ordered on 07/11/17 but not completed. Mammogram reordered today. Message sent to Referral Coordinator for scheduling purposes.  Lung Cancer Screening: Patient does qualify. An Epic message has been sent to Glenna Fellows, RN (Oncology Nurse Navigator) regarding the possible need for this exam. Ines Bloomer will review the patient's chart to determine if the patient truly qualifies for the exam. If the patient qualifies, Ines Bloomer will order the Low Dose CT of the chest to facilitate the scheduling of this exam.  Due for Hep C Screening. Ordered today. Pt provided with lab req but requested to completed blood work tomorrow.

## 2018-01-23 NOTE — Patient Instructions (Signed)
Judy Bolton , Thank you for taking time to come for your Medicare Wellness Visit. I appreciate your ongoing commitment to your health goals. Please review the following plan we discussed and let me know if I can assist you in the future.   Screening recommendations/referrals: Colorectal Screening: Completed colonoscopy 10/29/14. Repeat every 10 years. Mammogram: Ordered today Bone Density: Completed 11/07/16. Osteoporotic screenings no longer required.  Vision/Dental/Diabetic Exams: Diabetic Exams: Recommend annual diabetic eye exams for retinopathy and diabetic foot exams.  Diabetic Eye Exam: Up to date Diabetic Foot Exam: Up to date Recommended yearly ophthalmology/optometry visit for glaucoma screening and checkup Recommended yearly dental visit for hygiene and checkup  Vaccinations: Influenza vaccine: Up to date Pneumococcal vaccine: Completed series Tdap vaccine: Up to date Shingles vaccine: Please call your insurance company to determine your out of pocket expense for the Shingrix vaccine. You may also receive this vaccine at your local pharmacy or Health Dept.   Advanced directives: Advance directive discussed with you today. I have provided a copy for you to complete at home and have notarized. Once this is complete please bring a copy in to our office so we can scan it into your chart.  Conditions/risks identified: Recommend to drink at least 6-8 8oz glasses of water per day.  Next appointment: You are scheduled to see Dr. Yetta Barre on 01/24/18 @ 9:30am.   Please schedule your Annual Wellness Visit with your Nurse Health Advisor in one year.  Preventive Care 18 Years and Older, Female Preventive care refers to lifestyle choices and visits with your health care provider that can promote health and wellness. What does preventive care include?  A yearly physical exam. This is also called an annual well check.  Dental exams once or twice a year.  Routine eye exams. Ask your health  care provider how often you should have your eyes checked.  Personal lifestyle choices, including:  Daily care of your teeth and gums.  Regular physical activity.  Eating a healthy diet.  Avoiding tobacco and drug use.  Limiting alcohol use.  Practicing safe sex.  Taking low-dose aspirin every day.  Taking vitamin and mineral supplements as recommended by your health care provider. What happens during an annual well check? The services and screenings done by your health care provider during your annual well check will depend on your age, overall health, lifestyle risk factors, and family history of disease. Counseling  Your health care provider may ask you questions about your:  Alcohol use.  Tobacco use.  Drug use.  Emotional well-being.  Home and relationship well-being.  Sexual activity.  Eating habits.  History of falls.  Memory and ability to understand (cognition).  Work and work Astronomer.  Reproductive health. Screening  You may have the following tests or measurements:  Height, weight, and BMI.  Blood pressure.  Lipid and cholesterol levels. These may be checked every 5 years, or more frequently if you are over 82 years old.  Skin check.  Lung cancer screening. You may have this screening every year starting at age 50 if you have a 30-pack-year history of smoking and currently smoke or have quit within the past 15 years.  Fecal occult blood test (FOBT) of the stool. You may have this test every year starting at age 46.  Flexible sigmoidoscopy or colonoscopy. You may have a sigmoidoscopy every 5 years or a colonoscopy every 10 years starting at age 33.  Hepatitis C blood test.  Hepatitis B blood test.  Sexually transmitted  disease (STD) testing.  Diabetes screening. This is done by checking your blood sugar (glucose) after you have not eaten for a while (fasting). You may have this done every 1-3 years.  Bone density scan. This is done  to screen for osteoporosis. You may have this done starting at age 64.  Mammogram. This may be done every 1-2 years. Talk to your health care provider about how often you should have regular mammograms. Talk with your health care provider about your test results, treatment options, and if necessary, the need for more tests. Vaccines  Your health care provider may recommend certain vaccines, such as:  Influenza vaccine. This is recommended every year.  Tetanus, diphtheria, and acellular pertussis (Tdap, Td) vaccine. You may need a Td booster every 10 years.  Zoster vaccine. You may need this after age 75.  Pneumococcal 13-valent conjugate (PCV13) vaccine. One dose is recommended after age 55.  Pneumococcal polysaccharide (PPSV23) vaccine. One dose is recommended after age 11. Talk to your health care provider about which screenings and vaccines you need and how often you need them. This information is not intended to replace advice given to you by your health care provider. Make sure you discuss any questions you have with your health care provider. Document Released: 12/19/2015 Document Revised: 08/11/2016 Document Reviewed: 09/23/2015 Elsevier Interactive Patient Education  2017 [employer].  Fall Prevention in the Home Falls can cause injuries. They can happen to people of all ages. There are many things you can do to make your home safe and to help prevent falls. What can I do on the outside of my home?  Regularly fix the edges of walkways and driveways and fix any cracks.  Remove anything that might make you trip as you walk through a door, such as a raised step or threshold.  Trim any bushes or trees on the path to your home.  Use bright outdoor lighting.  Clear any walking paths of anything that might make someone trip, such as rocks or tools.  Regularly check to see if handrails are loose or broken. Make sure that both sides of any steps have handrails.  Any raised decks  and porches should have guardrails on the edges.  Have any leaves, snow, or ice cleared regularly.  Use sand or salt on walking paths during winter.  Clean up any spills in your garage right away. This includes oil or grease spills. What can I do in the bathroom?  Use night lights.  Install grab bars by the toilet and in the tub and shower. Do not use towel bars as grab bars.  Use non-skid mats or decals in the tub or shower.  If you need to sit down in the shower, use a plastic, non-slip stool.  Keep the floor dry. Clean up any water that spills on the floor as soon as it happens.  Remove soap buildup in the tub or shower regularly.  Attach bath mats securely with double-sided non-slip rug tape.  Do not have throw rugs and other things on the floor that can make you trip. What can I do in the bedroom?  Use night lights.  Make sure that you have a light by your bed that is easy to reach.  Do not use any sheets or blankets that are too big for your bed. They should not hang down onto the floor.  Have a firm chair that has side arms. You can use this for support while you get dressed.  Do not have throw rugs and other things on the floor that can make you trip. What can I do in the kitchen?  Clean up any spills right away.  Avoid walking on wet floors.  Keep items that you use a lot in easy-to-reach places.  If you need to reach something above you, use a strong step stool that has a grab bar.  Keep electrical cords out of the way.  Do not use floor polish or wax that makes floors slippery. If you must use wax, use non-skid floor wax.  Do not have throw rugs and other things on the floor that can make you trip. What can I do with my stairs?  Do not leave any items on the stairs.  Make sure that there are handrails on both sides of the stairs and use them. Fix handrails that are broken or loose. Make sure that handrails are as long as the stairways.  Check any  carpeting to make sure that it is firmly attached to the stairs. Fix any carpet that is loose or worn.  Avoid having throw rugs at the top or bottom of the stairs. If you do have throw rugs, attach them to the floor with carpet tape.  Make sure that you have a light switch at the top of the stairs and the bottom of the stairs. If you do not have them, ask someone to add them for you. What else can I do to help prevent falls?  Wear shoes that:  Do not have high heels.  Have rubber bottoms.  Are comfortable and fit you well.  Are closed at the toe. Do not wear sandals.  If you use a stepladder:  Make sure that it is fully opened. Do not climb a closed stepladder.  Make sure that both sides of the stepladder are locked into place.  Ask someone to hold it for you, if possible.  Clearly mark and make sure that you can see:  Any grab bars or handrails.  First and last steps.  Where the edge of each step is.  Use tools that help you move around (mobility aids) if they are needed. These include:  Canes.  Walkers.  Scooters.  Crutches.  Turn on the lights when you go into a dark area. Replace any light bulbs as soon as they burn out.  Set up your furniture so you have a clear path. Avoid moving your furniture around.  If any of your floors are uneven, fix them.  If there are any pets around you, be aware of where they are.  Review your medicines with your doctor. Some medicines can make you feel dizzy. This can increase your chance of falling. Ask your doctor what other things that you can do to help prevent falls. This information is not intended to replace advice given to you by your health care provider. Make sure you discuss any questions you have with your health care provider. Document Released: 09/18/2009 Document Revised: 04/29/2016 Document Reviewed: 12/27/2014 Elsevier Interactive Patient Education  2017 [employer].

## 2018-01-24 ENCOUNTER — Encounter: Payer: Self-pay | Admitting: Family Medicine

## 2018-01-24 ENCOUNTER — Ambulatory Visit (INDEPENDENT_AMBULATORY_CARE_PROVIDER_SITE_OTHER): Payer: Medicare HMO | Admitting: Family Medicine

## 2018-01-24 VITALS — BP 120/88 | HR 64 | Ht 62.0 in | Wt 145.0 lb

## 2018-01-24 DIAGNOSIS — E785 Hyperlipidemia, unspecified: Secondary | ICD-10-CM

## 2018-01-24 DIAGNOSIS — I10 Essential (primary) hypertension: Secondary | ICD-10-CM

## 2018-01-24 DIAGNOSIS — E119 Type 2 diabetes mellitus without complications: Secondary | ICD-10-CM

## 2018-01-24 DIAGNOSIS — Z1159 Encounter for screening for other viral diseases: Secondary | ICD-10-CM

## 2018-01-24 DIAGNOSIS — Z0000 Encounter for general adult medical examination without abnormal findings: Secondary | ICD-10-CM

## 2018-01-24 NOTE — Progress Notes (Signed)
Name: Judy Bolton   MRN: [id number]    DOB: [birth year]   Date:01/24/2018       Progress Note  Subjective  Chief Complaint  Chief Complaint  Patient presents with  . Annual Exam    needs mammo    Patient presents for annual physical exam.   Cough  This is a new problem. The current episode started more than 1 month ago. The problem has been waxing and waning. The cough is non-productive. Pertinent negatives include no chest pain, chills, ear congestion, ear pain, fever, headaches, heartburn, hemoptysis, myalgias, nasal congestion, postnasal drip, rash, rhinorrhea, sore throat, shortness of breath, sweats, weight loss or wheezing. There is no history of environmental allergies.    No problem-specific Assessment & Plan notes found for this encounter.   Past Medical History:  Diagnosis Date  . Acid reflux   . Arthritis   . Asthma   . Back pain   . Back pain   . COPD (chronic obstructive pulmonary disease) (HCC)   . Diabetes mellitus without complication (HCC)   . Hyperlipidemia   . Hypertension   . Stroke [facility])    March of 2017 vocabulary    Past Surgical History:  Procedure Laterality Date  . ABDOMINAL HYSTERECTOMY    . ANTERIOR (CYSTOCELE) AND POSTERIOR REPAIR (RECTOCELE) WITH XENFORM GRAFT AND SACROSPINOUS FIXATION    . APPENDECTOMY    . CTR Bilateral   . LUMBAR LAMINECTOMY/DECOMPRESSION MICRODISCECTOMY N/A 07/27/2017   Procedure: LUMBAR LAMINECTOMY/DECOMPRESSION MICRODISCECTOMY 3 LEVELS-L3-S1;  Surgeon: Venetia Night, MD;  Location: [facility] ORS;  Service: Neurosurgery;  Laterality: N/A;  . ROTATOR CUFF REPAIR Right     Family History  Problem Relation Age of Onset  . Heart attack Mother   . Cancer Father   . Heart attack Maternal Grandmother   . Heart attack Maternal Grandfather   . Breast cancer Paternal Grandmother     Social History   Socioeconomic History  . Marital status: Widowed    Spouse name: Not on file  . Number of children: 1  . Years of  education: some college  . Highest education level: 12th grade  Social Needs  . Financial resource strain: Not hard at all  . Food insecurity - worry: Never true  . Food insecurity - inability: Never true  . Transportation needs - medical: No  . Transportation needs - non-medical: No  Occupational History  . Occupation: Retired  Tobacco Use  . Smoking status: Former Smoker    Packs/day: 1.50    Years: 28.00    Pack years: 42.00    Types: Cigarettes    Last attempt to quit: 08/14/2000    Years since quitting: 17.4  . Smokeless tobacco: Never Used  . Tobacco comment: smoking cessatiuon materials not required  Substance and Sexual Activity  . Alcohol use: No  . Drug use: No  . Sexual activity: Not Currently  Other Topics Concern  . Not on file  Social History Narrative  . Not on file    Allergies  Allergen Reactions  . Codeine Itching  . Latex Itching and Swelling  . Meloxicam Swelling  . Penicillins Other (See Comments)    Paralysis  Has patient had a PCN reaction causing immediate rash, facial/tongue/throat swelling, SOB or lightheadedness with hypotension: No Has patient had a PCN reaction causing severe rash involving mucus membranes or skin necrosis: No Has patient had a PCN reaction that required hospitalization Yes Has patient had a PCN reaction occurring within the last  10 years: No If all of the above answers are "NO", then may proceed with Cephalosporin use.    Outpatient Medications Prior to Visit  Medication Sig Dispense Refill  . albuterol (PROVENTIL HFA;VENTOLIN HFA) 108 (90 Base) MCG/ACT inhaler Inhale 2 puffs into the lungs every 6 (six) hours as needed for wheezing or shortness of breath.    Marland Kitchen albuterol (PROVENTIL) (2.5 MG/3ML) 0.083% nebulizer solution Take 3 mLs (2.5 mg total) by nebulization every 6 (six) hours as needed for wheezing or shortness of breath. 75 mL 12  . ALPRAZolam (XANAX) 0.25 MG tablet Take 1 tablet (0.25 mg total) by mouth once as  needed for up to 1 dose for anxiety. 12 tablet 0  . atorvastatin (LIPITOR) 40 MG tablet Take 1 tablet (40 mg total) by mouth daily. 90 tablet 2  . fexofenadine (ALLEGRA) 180 MG tablet Take 180 mg by mouth daily.    . Fluticasone-Umeclidin-Vilant (TRELEGY ELLIPTA) 100-62.5-25 MCG/INH AEPB Inhale 1 puff into the lungs daily.    Marland Kitchen gemfibrozil (LOPID) 600 MG tablet Take 1 tablet (600 mg total) by mouth 2 (two) times daily. 180 tablet 2  . hydrochlorothiazide (HYDRODIURIL) 25 MG tablet Take 1 tablet (25 mg total) by mouth daily. 90 tablet 2  . liraglutide (VICTOZA) 18 MG/3ML SOPN Inject 18 mg into the skin daily as needed (FOR ELEVATED BLOOD SUGARS).     . metFORMIN (GLUCOPHAGE) 1000 MG tablet Take 1,000 mg by mouth 2 (two) times daily with a meal.    . metoprolol tartrate (LOPRESSOR) 25 MG tablet Take 1 tablet (25 mg total) by mouth 2 (two) times daily. 60 tablet 5  . predniSONE (DELTASONE) 10 MG tablet Take 10 mg by mouth daily.     . ranitidine (ZANTAC) 150 MG tablet Take 2 tablets (300 mg total) by mouth daily as needed for heartburn (takes 2 tablets if needed). 90 tablet 2  . vitamin B-12 (CYANOCOBALAMIN) 1000 MCG tablet Take 500 mcg by mouth at bedtime.     Marland Kitchen losartan (COZAAR) 50 MG tablet Take 1 tablet (50 mg total) by mouth 2 (two) times daily. 180 tablet 2  . budesonide-formoterol (SYMBICORT) 160-4.5 MCG/ACT inhaler Inhale 2 puffs into the lungs 2 (two) times daily. (Patient not taking: Reported on 01/24/2018) 1 Inhaler 3  . etodolac (LODINE XL) 500 MG 24 hr tablet Take 1 tablet (500 mg total) daily by mouth. (Patient not taking: Reported on 01/24/2018) 30 tablet 4  . insulin regular (HUMULIN R) 100 units/mL injection Inject 4-12 Units into the skin 2 (two) times daily.     Facility-Administered Medications Prior to Visit  Medication Dose Route Frequency Provider Last Rate Last Dose  . ipratropium-albuterol (DUONEB) 0.5-2.5 (3) MG/3ML nebulizer solution 3 mL  3 mL Nebulization Once Duanne Limerick, MD        Review of Systems  Constitutional: Negative for chills, fever, malaise/fatigue and weight loss.  HENT: Negative for ear discharge, ear pain, postnasal drip, rhinorrhea and sore throat.   Eyes: Negative for blurred vision.  Respiratory: Positive for cough. Negative for hemoptysis, sputum production, shortness of breath and wheezing.   Cardiovascular: Negative for chest pain, palpitations and leg swelling.  Gastrointestinal: Negative for abdominal pain, blood in stool, constipation, diarrhea, heartburn, melena and nausea.  Genitourinary: Negative for dysuria, frequency, hematuria and urgency.  Musculoskeletal: Negative for back pain, joint pain, myalgias and neck pain.  Skin: Negative for rash.  Neurological: Negative for dizziness, tingling, sensory change, focal weakness and headaches.  Endo/Heme/Allergies:  Negative for environmental allergies and polydipsia. Does not bruise/bleed easily.  Psychiatric/Behavioral: Negative for depression and suicidal ideas. The patient is not nervous/anxious and does not have insomnia.      Objective  Vitals:   01/24/18 0940  BP: 120/88  Pulse: 64  Weight: 145 lb (65.8 kg)  Height: 5\' 2"  (1.575 m)    Physical Exam  Constitutional: She is oriented to person, place, and time and well-developed, well-nourished, and in no distress. No distress.  HENT:  Head: Normocephalic and atraumatic.  Right Ear: Hearing, tympanic membrane, external ear and ear canal normal.  Left Ear: Hearing, tympanic membrane, external ear and ear canal normal.  Nose: Nose normal.  Mouth/Throat: Oropharynx is clear and moist.  Eyes: Conjunctivae and EOM are normal. Pupils are equal, round, and reactive to light. Right eye exhibits no discharge. Left eye exhibits no discharge.  Fundoscopic exam:      The right eye shows no arteriolar narrowing and no AV nicking.       The left eye shows no arteriolar narrowing and no AV nicking.  Neck: Trachea normal and normal  range of motion. Neck supple. Normal carotid pulses, no hepatojugular reflux and no JVD present. Carotid bruit is not present. No thyroid mass and no thyromegaly present.  Cardiovascular: Normal rate, regular rhythm, S1 normal, S2 normal, normal heart sounds, intact distal pulses and normal pulses. PMI is not displaced. Exam reveals no gallop, no S3, no S4 and no friction rub.  No murmur heard. Pulmonary/Chest: Effort normal and breath sounds normal. No accessory muscle usage. No respiratory distress. Right breast exhibits no inverted nipple, no mass, no nipple discharge, no skin change and no tenderness. Left breast exhibits no inverted nipple, no mass, no nipple discharge and no skin change. Breasts are symmetrical.  Abdominal: Soft. Normal aorta and bowel sounds are normal. She exhibits no mass. There is no hepatosplenomegaly. There is no tenderness. There is no rigidity, no rebound, no guarding and no CVA tenderness.  Musculoskeletal: Normal range of motion. She exhibits no edema.  Lymphadenopathy:       Head (right side): No submandibular adenopathy present.       Head (left side): No submandibular adenopathy present.    She has no cervical adenopathy.  Neurological: She is alert and oriented to person, place, and time. She has normal sensation, normal strength, normal reflexes and intact cranial nerves.  Skin: Skin is warm, dry and intact. She is not diaphoretic.  Psychiatric: Mood and affect normal.  Nursing note and vitals reviewed.     Assessment & Plan  Problem List Items Addressed This Visit      Cardiovascular and Mediastinum   Essential hypertension   Relevant Orders   Renal Function Panel     Endocrine   Type 2 diabetes mellitus without complication, without long-term current use of insulin (HCC)   Relevant Orders   Hemoglobin A1c   Renal Function Panel   Lipid Profile     Other   Hyperlipidemia   Relevant Orders   Lipid Profile    Other Visit Diagnoses     Encounter for annual physical examination excluding gynecological examination in a patient older than 17 years    -  Primary   Need for hepatitis C screening test          No orders of the defined types were placed in this encounter. SAMIYAH SCHACH is a 72 y.o. female who presents today for her Complete Annual Exam. She feels  well. She reports exercising intermitant. She reports she is sleeping well.  Immunizations are reviewed and recommendations provided.   Age appropriate screening tests are discussed. Counseling given for risk factor reduction interventions. Health risks of being over weight were discussed and patient was counseled on weight loss options and exercise.  Dr. Hayden [facility]  01/24/18

## 2018-01-25 ENCOUNTER — Ambulatory Visit
Admission: RE | Admit: 2018-01-25 | Discharge: 2018-01-25 | Disposition: A | Discharge status: Home / Self Care | Payer: Medicare HMO | Source: Ambulatory Visit | Attending: Family Medicine | Admitting: Family Medicine

## 2018-01-25 DIAGNOSIS — Z1239 Encounter for other screening for malignant neoplasm of breast: Secondary | ICD-10-CM

## 2018-01-25 DIAGNOSIS — Z1231 Encounter for screening mammogram for malignant neoplasm of breast: Secondary | ICD-10-CM | POA: Diagnosis not present

## 2018-01-25 LAB — HEMOGLOBIN A1C
Est. average glucose Bld gHb Est-mCnc: 151 mg/dL
Hgb A1c MFr Bld: 6.9 % — ABNORMAL HIGH (ref 4.8–5.6)

## 2018-01-25 LAB — RENAL FUNCTION PANEL
ALBUMIN: 4.8 g/dL (ref 3.6–4.8)
BUN/Creatinine Ratio: 23 (ref 12–28)
BUN: 33 mg/dL — ABNORMAL HIGH (ref 8–27)
CO2: 20 mmol/L (ref 20–29)
Calcium: 9.9 mg/dL (ref 8.7–10.3)
Chloride: 103 mmol/L (ref 96–106)
Creatinine, Ser: 1.45 mg/dL — ABNORMAL HIGH (ref 0.57–1.00)
GFR calc Af Amer: 42 mL/min/{1.73_m2} — ABNORMAL LOW (ref >59)
GFR, EST NON AFRICAN AMERICAN: 37 mL/min/{1.73_m2} — AB (ref >59)
GLUCOSE: 130 mg/dL — AB (ref 65–99)
PHOSPHORUS: 3.4 mg/dL (ref 2.5–4.5)
POTASSIUM: 5 mmol/L (ref 3.5–5.2)
Sodium: 141 mmol/L (ref 134–144)

## 2018-01-25 LAB — LIPID PANEL
CHOL/HDL RATIO: 2.8 ratio (ref 0.0–4.4)
Cholesterol, Total: 185 mg/dL (ref 100–199)
HDL: 66 mg/dL (ref >39)
LDL Calculated: 88 mg/dL (ref 0–99)
TRIGLYCERIDES: 154 mg/dL — AB (ref 0–149)
VLDL Cholesterol Cal: 31 mg/dL (ref 5–40)

## 2018-01-25 LAB — HEPATITIS C ANTIBODY: Hep C Virus Ab: 0.1 s/co ratio (ref 0.0–0.9)

## 2018-02-01 ENCOUNTER — Ambulatory Visit: Payer: Medicare HMO

## 2018-03-27 DIAGNOSIS — R69 Illness, unspecified: Secondary | ICD-10-CM | POA: Diagnosis not present

## 2018-04-07 ENCOUNTER — Other Ambulatory Visit: Payer: Self-pay | Admitting: Family Medicine

## 2018-04-12 ENCOUNTER — Other Ambulatory Visit: Payer: Self-pay | Admitting: Family Medicine

## 2018-04-12 DIAGNOSIS — E785 Hyperlipidemia, unspecified: Secondary | ICD-10-CM

## 2018-04-20 ENCOUNTER — Ambulatory Visit (INDEPENDENT_AMBULATORY_CARE_PROVIDER_SITE_OTHER): Payer: Medicare HMO | Admitting: Family Medicine

## 2018-04-20 ENCOUNTER — Encounter: Payer: Self-pay | Admitting: Family Medicine

## 2018-04-20 VITALS — BP 130/80 | HR 80 | Ht 62.0 in | Wt 142.0 lb

## 2018-04-20 DIAGNOSIS — R058 Other specified cough: Secondary | ICD-10-CM

## 2018-04-20 DIAGNOSIS — I700 Atherosclerosis of aorta: Secondary | ICD-10-CM | POA: Diagnosis not present

## 2018-04-20 DIAGNOSIS — I10 Essential (primary) hypertension: Secondary | ICD-10-CM | POA: Diagnosis not present

## 2018-04-20 DIAGNOSIS — T464X5A Adverse effect of angiotensin-converting-enzyme inhibitors, initial encounter: Secondary | ICD-10-CM | POA: Insufficient documentation

## 2018-04-20 DIAGNOSIS — R05 Cough: Secondary | ICD-10-CM

## 2018-04-20 MED ORDER — METOPROLOL TARTRATE 25 MG PO TABS: 25.0000 mg | ORAL_TABLET | Freq: Two times a day (BID) | ORAL | 1 refills | Status: DC

## 2018-04-20 MED ORDER — HYDROCHLOROTHIAZIDE 25 MG PO TABS: 25.0000 mg | ORAL_TABLET | Freq: Every day | ORAL | 1 refills | Status: DC

## 2018-04-20 NOTE — Progress Notes (Signed)
Name: Judy Bolton   MRN: [id number]    DOB: 12/06/1946   Date:04/20/2018       Progress Note  Subjective  Chief Complaint  Chief Complaint  Patient presents with  . Hypertension  . Labs consultation    discuss hep labs needed?    Patient possible exposure to CMV hepatitis and desire to be informed.  Hypertension  This is a chronic problem. The current episode started more than 1 year ago. The problem is unchanged. The problem is controlled. Pertinent negatives include no anxiety, blurred vision, chest pain, headaches, malaise/fatigue, neck pain, orthopnea, palpitations, peripheral edema, PND, shortness of breath or sweats. There are no associated agents to hypertension. Risk factors for coronary artery disease include post-menopausal state. Past treatments include beta blockers and diuretics (recent cessation od losartin). The current treatment provides moderate improvement. There are no compliance problems.  There is no history of angina, kidney disease, CAD/MI, CVA, heart failure, left ventricular hypertrophy, PVD or retinopathy. There is no history of chronic renal disease, a hypertension causing med or renovascular disease.  Cough  This is a chronic problem. The current episode started more than 1 year ago. The problem has been waxing and waning. The cough is non-productive. Pertinent negatives include no chest pain, chills, ear pain, fever, headaches, heartburn, myalgias, rash, sore throat, shortness of breath, sweats, weight loss or wheezing. Exacerbated by: medication. She has tried oral steroids, a beta-agonist inhaler and ipratropium inhaler for the symptoms. The treatment provided significant (since stopping ace/arb) relief. Her past medical history is significant for COPD. There is no history of asthma, bronchiectasis, bronchitis, emphysema, environmental allergies or pneumonia.    No problem-specific Assessment & Plan notes found for this encounter.   Past Medical History:   Diagnosis Date  . Acid reflux   . Arthritis   . Asthma   . Back pain   . Back pain   . COPD (chronic obstructive pulmonary disease) (HCC)   . Diabetes mellitus without complication (HCC)   . Hyperlipidemia   . Hypertension   . Stroke [facility])    March of 2017 vocabulary    Past Surgical History:  Procedure Laterality Date  . ABDOMINAL HYSTERECTOMY    . ANTERIOR (CYSTOCELE) AND POSTERIOR REPAIR (RECTOCELE) WITH XENFORM GRAFT AND SACROSPINOUS FIXATION    . APPENDECTOMY    . CTR Bilateral   . LUMBAR LAMINECTOMY/DECOMPRESSION MICRODISCECTOMY N/A 07/27/2017   Procedure: LUMBAR LAMINECTOMY/DECOMPRESSION MICRODISCECTOMY 3 LEVELS-L3-S1;  Surgeon: Venetia Night, MD;  Location: [facility] ORS;  Service: Neurosurgery;  Laterality: N/A;  . ROTATOR CUFF REPAIR Right     Family History  Problem Relation Age of Onset  . Heart attack Mother   . Cancer Father   . Heart attack Maternal Grandmother   . Heart attack Maternal Grandfather   . Breast cancer Paternal Grandmother     Social History   Socioeconomic History  . Marital status: Widowed    Spouse name: Not on file  . Number of children: 1  . Years of education: some college  . Highest education level: 12th grade  Occupational History  . Occupation: Retired  Engineer, production  . Financial resource strain: Not hard at all  . Food insecurity:    Worry: Never true    Inability: Never true  . Transportation needs:    Medical: No    Non-medical: No  Tobacco Use  . Smoking status: Former Smoker    Packs/day: 1.50    Years: 28.00    Pack years:  42.00    Types: Cigarettes    Last attempt to quit: 08/14/2000    Years since quitting: 17.6  . Smokeless tobacco: Never Used  . Tobacco comment: smoking cessatiuon materials not required  Substance and Sexual Activity  . Alcohol use: No  . Drug use: No  . Sexual activity: Not Currently  Lifestyle  . Physical activity:    Days per week: 0 days    Minutes per session: 0 min  . Stress: Not  at all  Relationships  . Social connections:    Talks on phone: Patient refused    Gets together: Patient refused    Attends religious service: Patient refused    Active member of club or organization: Patient refused    Attends meetings of clubs or organizations: Patient refused    Relationship status: Widowed  . Intimate partner violence:    Fear of current or ex partner: No    Emotionally abused: No    Physically abused: No    Forced sexual activity: No  Other Topics Concern  . Not on file  Social History Narrative  . Not on file    Allergies  Allergen Reactions  . Codeine Itching  . Latex Itching and Swelling  . Meloxicam Swelling  . Penicillins Other (See Comments)    Paralysis  Has patient had a PCN reaction causing immediate rash, facial/tongue/throat swelling, SOB or lightheadedness with hypotension: No Has patient had a PCN reaction causing severe rash involving mucus membranes or skin necrosis: No Has patient had a PCN reaction that required hospitalization Yes Has patient had a PCN reaction occurring within the last 10 years: No If all of the above answers are "NO", then may proceed with Cephalosporin use.    Outpatient Medications Prior to Visit  Medication Sig Dispense Refill  . albuterol (PROVENTIL HFA;VENTOLIN HFA) 108 (90 Base) MCG/ACT inhaler Inhale 2 puffs into the lungs every 6 (six) hours as needed for wheezing or shortness of breath.    Marland Kitchen albuterol (PROVENTIL) (2.5 MG/3ML) 0.083% nebulizer solution Take 3 mLs (2.5 mg total) by nebulization every 6 (six) hours as needed for wheezing or shortness of breath. 75 mL 12  . ALPRAZolam (XANAX) 0.25 MG tablet Take 1 tablet (0.25 mg total) by mouth once as needed for up to 1 dose for anxiety. 12 tablet 0  . atorvastatin (LIPITOR) 40 MG tablet TAKE 1 TABLET BY MOUTH EVERY DAY 90 tablet 0  . budesonide-formoterol (SYMBICORT) 160-4.5 MCG/ACT inhaler Inhale 2 puffs into the lungs 2 (two) times daily. 1 Inhaler 3  .  etodolac (LODINE XL) 500 MG 24 hr tablet TAKE 1 TABLET (500 MG TOTAL) DAILY BY MOUTH. 30 tablet 0  . fexofenadine (ALLEGRA) 180 MG tablet Take 180 mg by mouth daily.    . Fluticasone-Umeclidin-Vilant (TRELEGY ELLIPTA) 100-62.5-25 MCG/INH AEPB Inhale 1 puff into the lungs daily.    Marland Kitchen gemfibrozil (LOPID) 600 MG tablet Take 1 tablet (600 mg total) by mouth 2 (two) times daily. 180 tablet 2  . liraglutide (VICTOZA) 18 MG/3ML SOPN Inject 18 mg into the skin daily as needed (FOR ELEVATED BLOOD SUGARS).     . metFORMIN (GLUCOPHAGE) 1000 MG tablet Take 1,000 mg by mouth 2 (two) times daily with a meal.    . predniSONE (DELTASONE) 10 MG tablet Take 10 mg by mouth daily.     . ranitidine (ZANTAC) 150 MG tablet Take 2 tablets (300 mg total) by mouth daily as needed for heartburn (takes 2 tablets if needed).  90 tablet 2  . vitamin B-12 (CYANOCOBALAMIN) 1000 MCG tablet Take 500 mcg by mouth at bedtime.     . hydrochlorothiazide (HYDRODIURIL) 25 MG tablet Take 1 tablet (25 mg total) by mouth daily. 90 tablet 2  . metoprolol tartrate (LOPRESSOR) 25 MG tablet Take 1 tablet (25 mg total) by mouth 2 (two) times daily. 60 tablet 5   Facility-Administered Medications Prior to Visit  Medication Dose Route Frequency Provider Last Rate Last Dose  . ipratropium-albuterol (DUONEB) 0.5-2.5 (3) MG/3ML nebulizer solution 3 mL  3 mL Nebulization Once Duanne Limerick, MD        Review of Systems  Constitutional: Negative for chills, fever, malaise/fatigue and weight loss.  HENT: Negative for ear discharge, ear pain and sore throat.   Eyes: Negative for blurred vision.  Respiratory: Negative for cough, sputum production, shortness of breath and wheezing.   Cardiovascular: Negative for chest pain, palpitations, orthopnea, leg swelling and PND.  Gastrointestinal: Negative for abdominal pain, blood in stool, constipation, diarrhea, heartburn, melena and nausea.  Genitourinary: Negative for dysuria, frequency, hematuria and  urgency.  Musculoskeletal: Negative for back pain, joint pain, myalgias and neck pain.  Skin: Negative for rash.  Neurological: Negative for dizziness, tingling, sensory change, focal weakness and headaches.  Endo/Heme/Allergies: Negative for environmental allergies and polydipsia. Does not bruise/bleed easily.  Psychiatric/Behavioral: Negative for depression and suicidal ideas. The patient is not nervous/anxious and does not have insomnia.      Objective  Vitals:   04/20/18 0924  BP: 130/80  Pulse: 80  Weight: 142 lb (64.4 kg)  Height: 5\' 2"  (1.575 m)    Physical Exam  Constitutional: No distress.  HENT:  Head: Normocephalic and atraumatic.  Right Ear: External ear normal.  Left Ear: External ear normal.  Nose: Nose normal.  Mouth/Throat: Oropharynx is clear and moist.  Eyes: Pupils are equal, round, and reactive to light. Conjunctivae and EOM are normal. Right eye exhibits no discharge. Left eye exhibits no discharge.  Neck: Normal range of motion. Neck supple. No JVD present. No thyromegaly present.  Cardiovascular: Normal rate, regular rhythm, normal heart sounds and intact distal pulses. Exam reveals no gallop and no friction rub.  No murmur heard. Pulmonary/Chest: Effort normal. No stridor. No respiratory distress. She has wheezes. She has no rales. She exhibits no tenderness.  Abdominal: Soft. Bowel sounds are normal. She exhibits no mass. There is no hepatosplenomegaly. There is no tenderness. There is no rigidity, no rebound, no guarding and no CVA tenderness.  Musculoskeletal: Normal range of motion. She exhibits no edema.  Lymphadenopathy:       Head (right side): No submandibular adenopathy present.       Head (left side): No submandibular adenopathy present.    She has no cervical adenopathy.  Neurological: She is alert. She has normal reflexes.  Skin: Skin is warm, dry and intact. She is not diaphoretic. No pallor.  No icteris  Nursing note and vitals  reviewed.     Assessment & Plan  Problem List Items Addressed This Visit      Cardiovascular and Mediastinum   Aortic atherosclerosis (HCC)   Relevant Medications   hydrochlorothiazide (HYDRODIURIL) 25 MG tablet   metoprolol tartrate (LOPRESSOR) 25 MG tablet   Essential hypertension - Primary   Relevant Medications   hydrochlorothiazide (HYDRODIURIL) 25 MG tablet   metoprolol tartrate (LOPRESSOR) 25 MG tablet     Other   RESOLVED: Cough due to ACE inhibitor      Meds ordered  this encounter  Medications  . hydrochlorothiazide (HYDRODIURIL) 25 MG tablet    Sig: Take 1 tablet (25 mg total) by mouth daily.    Dispense:  90 tablet    Refill:  1  . metoprolol tartrate (LOPRESSOR) 25 MG tablet    Sig: Take 1 tablet (25 mg total) by mouth 2 (two) times daily.    Dispense:  180 tablet    Refill:  1  pt to continue meds as discussed    Dr. Elizabeth Sauer [facility]  04/20/18

## 2018-05-04 DIAGNOSIS — H02834 Dermatochalasis of left upper eyelid: Secondary | ICD-10-CM | POA: Diagnosis not present

## 2018-05-04 DIAGNOSIS — H02831 Dermatochalasis of right upper eyelid: Secondary | ICD-10-CM | POA: Diagnosis not present

## 2018-05-05 DIAGNOSIS — J439 Emphysema, unspecified: Secondary | ICD-10-CM | POA: Diagnosis not present

## 2018-05-09 ENCOUNTER — Ambulatory Visit (INDEPENDENT_AMBULATORY_CARE_PROVIDER_SITE_OTHER): Payer: Medicare HMO | Admitting: Family Medicine

## 2018-05-09 ENCOUNTER — Encounter: Payer: Self-pay | Admitting: Family Medicine

## 2018-05-09 DIAGNOSIS — J432 Centrilobular emphysema: Secondary | ICD-10-CM | POA: Diagnosis not present

## 2018-05-09 MED ORDER — GUAIFENESIN-CODEINE 100-10 MG/5ML PO SYRP: 5.0000 mL | ORAL_SOLUTION | Freq: Three times a day (TID) | ORAL | 0 refills | Status: DC | PRN

## 2018-05-09 MED ORDER — BENZONATATE 100 MG PO CAPS: 100.0000 mg | ORAL_CAPSULE | Freq: Three times a day (TID) | ORAL | 0 refills | Status: DC | PRN

## 2018-05-09 NOTE — Progress Notes (Signed)
Name: Judy Bolton   MRN: [id number]    DOB: 10-28-1946   Date:05/09/2018       Progress Note  Subjective  Chief Complaint  Chief Complaint  Patient presents with  . Cough    white production. received solumedrol shot on 05/05/2018 with Dr Meredeth Ide. Finished last prednisone dose last Friday.    Cough  This is a new problem. The current episode started more than 1 month ago. The problem has been unchanged. The problem occurs every few minutes. The cough is productive of sputum. Associated symptoms include wheezing. Pertinent negatives include no chest pain, chills, ear congestion, ear pain, fever, headaches, heartburn, hemoptysis, myalgias, nasal congestion, postnasal drip, rash, rhinorrhea, sore throat, shortness of breath, sweats or weight loss. Nothing aggravates the symptoms. There is no history of environmental allergies.    Centrilobular emphysema (HCC) Differential: COPD flare/add spireva respimist 2 puffs/ postnasal drainage/ sudafed and robitussin AC /Samples symbicort 160 given   Past Medical History:  Diagnosis Date  . Acid reflux   . Arthritis   . Asthma   . Back pain   . Back pain   . COPD (chronic obstructive pulmonary disease) (HCC)   . Diabetes mellitus without complication (HCC)   . Hyperlipidemia   . Hypertension   . Stroke [facility])    March of 2017 vocabulary    Past Surgical History:  Procedure Laterality Date  . ABDOMINAL HYSTERECTOMY    . ANTERIOR (CYSTOCELE) AND POSTERIOR REPAIR (RECTOCELE) WITH XENFORM GRAFT AND SACROSPINOUS FIXATION    . APPENDECTOMY    . CTR Bilateral   . LUMBAR LAMINECTOMY/DECOMPRESSION MICRODISCECTOMY N/A 07/27/2017   Procedure: LUMBAR LAMINECTOMY/DECOMPRESSION MICRODISCECTOMY 3 LEVELS-L3-S1;  Surgeon: Venetia Night, MD;  Location: [facility] ORS;  Service: Neurosurgery;  Laterality: N/A;  . ROTATOR CUFF REPAIR Right     Family History  Problem Relation Age of Onset  . Heart attack Mother   . Cancer Father   . Heart attack  Maternal Grandmother   . Heart attack Maternal Grandfather   . Breast cancer Paternal Grandmother     Social History   Socioeconomic History  . Marital status: Widowed    Spouse name: Not on file  . Number of children: 1  . Years of education: some college  . Highest education level: 12th grade  Occupational History  . Occupation: Retired  Engineer, production  . Financial resource strain: Not hard at all  . Food insecurity:    Worry: Never true    Inability: Never true  . Transportation needs:    Medical: No    Non-medical: No  Tobacco Use  . Smoking status: Former Smoker    Packs/day: 1.50    Years: 28.00    Pack years: 42.00    Types: Cigarettes    Last attempt to quit: 08/14/2000    Years since quitting: 17.7  . Smokeless tobacco: Never Used  . Tobacco comment: smoking cessatiuon materials not required  Substance and Sexual Activity  . Alcohol use: No  . Drug use: No  . Sexual activity: Not Currently  Lifestyle  . Physical activity:    Days per week: 0 days    Minutes per session: 0 min  . Stress: Not at all  Relationships  . Social connections:    Talks on phone: Patient refused    Gets together: Patient refused    Attends religious service: Patient refused    Active member of club or organization: Patient refused    Attends meetings of clubs  or organizations: Patient refused    Relationship status: Widowed  . Intimate partner violence:    Fear of current or ex partner: No    Emotionally abused: No    Physically abused: No    Forced sexual activity: No  Other Topics Concern  . Not on file  Social History Narrative  . Not on file    Allergies  Allergen Reactions  . Codeine Itching  . Latex Itching and Swelling  . Meloxicam Swelling  . Penicillins Other (See Comments)    Paralysis  Has patient had a PCN reaction causing immediate rash, facial/tongue/throat swelling, SOB or lightheadedness with hypotension: No Has patient had a PCN reaction causing severe  rash involving mucus membranes or skin necrosis: No Has patient had a PCN reaction that required hospitalization Yes Has patient had a PCN reaction occurring within the last 10 years: No If all of the above answers are "NO", then may proceed with Cephalosporin use.    Outpatient Medications Prior to Visit  Medication Sig Dispense Refill  . albuterol (PROVENTIL HFA;VENTOLIN HFA) 108 (90 Base) MCG/ACT inhaler Inhale 2 puffs into the lungs every 6 (six) hours as needed for wheezing or shortness of breath.    Marland Kitchen albuterol (PROVENTIL) (2.5 MG/3ML) 0.083% nebulizer solution Take 3 mLs (2.5 mg total) by nebulization every 6 (six) hours as needed for wheezing or shortness of breath. 75 mL 12  . ALPRAZolam (XANAX) 0.25 MG tablet Take 1 tablet (0.25 mg total) by mouth once as needed for up to 1 dose for anxiety. 12 tablet 0  . atorvastatin (LIPITOR) 40 MG tablet TAKE 1 TABLET BY MOUTH EVERY DAY 90 tablet 0  . budesonide-formoterol (SYMBICORT) 160-4.5 MCG/ACT inhaler Inhale 2 puffs into the lungs 2 (two) times daily. 1 Inhaler 3  . etodolac (LODINE XL) 500 MG 24 hr tablet TAKE 1 TABLET (500 MG TOTAL) DAILY BY MOUTH. 30 tablet 0  . fexofenadine (ALLEGRA) 180 MG tablet Take 180 mg by mouth daily.    Marland Kitchen gemfibrozil (LOPID) 600 MG tablet Take 1 tablet (600 mg total) by mouth 2 (two) times daily. 180 tablet 2  . hydrochlorothiazide (HYDRODIURIL) 25 MG tablet Take 1 tablet (25 mg total) by mouth daily. 90 tablet 1  . liraglutide (VICTOZA) 18 MG/3ML SOPN Inject 18 mg into the skin daily as needed (FOR ELEVATED BLOOD SUGARS).     . metFORMIN (GLUCOPHAGE) 1000 MG tablet Take 1,000 mg by mouth 2 (two) times daily with a meal.    . metoprolol tartrate (LOPRESSOR) 25 MG tablet Take 1 tablet (25 mg total) by mouth 2 (two) times daily. 180 tablet 1  . ranitidine (ZANTAC) 150 MG tablet Take 2 tablets (300 mg total) by mouth daily as needed for heartburn (takes 2 tablets if needed). 90 tablet 2  . vitamin B-12  (CYANOCOBALAMIN) 1000 MCG tablet Take 500 mcg by mouth at bedtime.     . Fluticasone-Umeclidin-Vilant (TRELEGY ELLIPTA) 100-62.5-25 MCG/INH AEPB Inhale 1 puff into the lungs daily.    . predniSONE (DELTASONE) 10 MG tablet Take 10 mg by mouth daily.      Facility-Administered Medications Prior to Visit  Medication Dose Route Frequency Provider Last Rate Last Dose  . ipratropium-albuterol (DUONEB) 0.5-2.5 (3) MG/3ML nebulizer solution 3 mL  3 mL Nebulization Once Duanne Limerick, MD        Review of Systems  Constitutional: Negative for chills, fever, malaise/fatigue and weight loss.  HENT: Negative for ear discharge, ear pain, postnasal drip, rhinorrhea and  sore throat.   Eyes: Negative for blurred vision.  Respiratory: Positive for cough and wheezing. Negative for hemoptysis, sputum production and shortness of breath.   Cardiovascular: Negative for chest pain, palpitations and leg swelling.  Gastrointestinal: Negative for abdominal pain, blood in stool, constipation, diarrhea, heartburn, melena and nausea.  Genitourinary: Negative for dysuria, frequency, hematuria and urgency.  Musculoskeletal: Negative for back pain, joint pain, myalgias and neck pain.  Skin: Negative for rash.  Neurological: Negative for dizziness, tingling, sensory change, focal weakness and headaches.  Endo/Heme/Allergies: Negative for environmental allergies and polydipsia. Does not bruise/bleed easily.  Psychiatric/Behavioral: Negative for depression and suicidal ideas. The patient is not nervous/anxious and does not have insomnia.      Objective  Vitals:   05/09/18 1431  BP: 130/80  Pulse: 80  SpO2: 98%  Weight: 143 lb (64.9 kg)  Height: 5\' 2"  (1.575 m)    Physical Exam  Constitutional: She is oriented to person, place, and time. She appears well-developed and well-nourished.  HENT:  Head: Normocephalic.  Right Ear: External ear normal.  Left Ear: External ear normal.  Mouth/Throat: Oropharynx is  clear and moist.  Eyes: Pupils are equal, round, and reactive to light. Conjunctivae and EOM are normal. Lids are everted and swept, no foreign bodies found. Left eye exhibits no hordeolum. No foreign body present in the left eye. Right conjunctiva is not injected. Left conjunctiva is not injected. No scleral icterus.  Neck: Normal range of motion. Neck supple. No JVD present. No tracheal deviation present. No thyromegaly present.  Cardiovascular: Normal rate, regular rhythm, normal heart sounds and intact distal pulses. Exam reveals no gallop and no friction rub.  No murmur heard. Pulmonary/Chest: Effort normal and breath sounds normal. No respiratory distress. She has no wheezes. She has no rales.  Abdominal: Soft. Bowel sounds are normal. She exhibits no mass. There is no hepatosplenomegaly. There is no tenderness. There is no rebound and no guarding.  Musculoskeletal: Normal range of motion. She exhibits no edema or tenderness.  Lymphadenopathy:    She has no cervical adenopathy.  Neurological: She is alert and oriented to person, place, and time. She has normal strength. She displays normal reflexes. No cranial nerve deficit.  Skin: Skin is warm. No rash noted.  Psychiatric: She has a normal mood and affect. Her mood appears not anxious. She does not exhibit a depressed mood.  Nursing note and vitals reviewed.     Assessment & Plan  Problem List Items Addressed This Visit      Respiratory   Centrilobular emphysema (HCC)    Differential: COPD flare/add spireva respimist 2 puffs/ postnasal drainage/ sudafed and robitussin AC /Samples symbicort 160 given      Relevant Medications   benzonatate (TESSALON PERLES) 100 MG capsule   guaiFENesin-codeine (ROBITUSSIN AC) 100-10 MG/5ML syrup      Meds ordered this encounter  Medications  . benzonatate (TESSALON PERLES) 100 MG capsule    Sig: Take 1 capsule (100 mg total) by mouth 3 (three) times daily as needed for cough.    Dispense:   20 capsule    Refill:  0  . guaiFENesin-codeine (ROBITUSSIN AC) 100-10 MG/5ML syrup    Sig: Take 5 mLs by mouth 3 (three) times daily as needed for cough.    Dispense:  120 mL    Refill:  0      Dr. [facility]  05/09/18

## 2018-05-09 NOTE — Assessment & Plan Note (Signed)
Differential: COPD flare/add spireva respimist 2 puffs/ postnasal drainage/ sudafed and robitussin AC /Samples symbicort 160 given

## 2018-05-10 MED ORDER — ALBUTEROL SULFATE HFA 108 (90 BASE) MCG/ACT IN AERS: 2.0000 | INHALATION_SPRAY | Freq: Four times a day (QID) | RESPIRATORY_TRACT | 3 refills | Status: DC | PRN

## 2018-05-10 NOTE — Addendum Note (Signed)
Addended by: Everitt Amber on: 05/10/2018 08:59 AM   Modules accepted: Orders

## 2018-05-29 DIAGNOSIS — K219 Gastro-esophageal reflux disease without esophagitis: Secondary | ICD-10-CM | POA: Diagnosis not present

## 2018-05-29 DIAGNOSIS — Z7982 Long term (current) use of aspirin: Secondary | ICD-10-CM | POA: Diagnosis not present

## 2018-05-29 DIAGNOSIS — M1990 Unspecified osteoarthritis, unspecified site: Secondary | ICD-10-CM | POA: Diagnosis not present

## 2018-05-29 DIAGNOSIS — J449 Chronic obstructive pulmonary disease, unspecified: Secondary | ICD-10-CM | POA: Diagnosis not present

## 2018-05-29 DIAGNOSIS — Z791 Long term (current) use of non-steroidal anti-inflammatories (NSAID): Secondary | ICD-10-CM | POA: Diagnosis not present

## 2018-05-29 DIAGNOSIS — E785 Hyperlipidemia, unspecified: Secondary | ICD-10-CM | POA: Diagnosis not present

## 2018-05-29 DIAGNOSIS — G8929 Other chronic pain: Secondary | ICD-10-CM | POA: Diagnosis not present

## 2018-05-29 DIAGNOSIS — I10 Essential (primary) hypertension: Secondary | ICD-10-CM | POA: Diagnosis not present

## 2018-05-29 DIAGNOSIS — E119 Type 2 diabetes mellitus without complications: Secondary | ICD-10-CM | POA: Diagnosis not present

## 2018-05-29 DIAGNOSIS — Z7951 Long term (current) use of inhaled steroids: Secondary | ICD-10-CM | POA: Diagnosis not present

## 2018-06-03 DIAGNOSIS — N3000 Acute cystitis without hematuria: Secondary | ICD-10-CM | POA: Diagnosis not present

## 2018-06-03 DIAGNOSIS — M542 Cervicalgia: Secondary | ICD-10-CM | POA: Diagnosis not present

## 2018-06-03 DIAGNOSIS — S51811A Laceration without foreign body of right forearm, initial encounter: Secondary | ICD-10-CM | POA: Diagnosis not present

## 2018-06-03 DIAGNOSIS — S0990XA Unspecified injury of head, initial encounter: Secondary | ICD-10-CM | POA: Diagnosis not present

## 2018-06-03 DIAGNOSIS — R55 Syncope and collapse: Secondary | ICD-10-CM | POA: Diagnosis not present

## 2018-06-03 DIAGNOSIS — S99911A Unspecified injury of right ankle, initial encounter: Secondary | ICD-10-CM | POA: Diagnosis not present

## 2018-06-03 DIAGNOSIS — M25531 Pain in right wrist: Secondary | ICD-10-CM | POA: Diagnosis not present

## 2018-06-03 DIAGNOSIS — S3992XA Unspecified injury of lower back, initial encounter: Secondary | ICD-10-CM | POA: Diagnosis not present

## 2018-06-03 DIAGNOSIS — R0789 Other chest pain: Secondary | ICD-10-CM | POA: Diagnosis not present

## 2018-06-03 DIAGNOSIS — S299XXA Unspecified injury of thorax, initial encounter: Secondary | ICD-10-CM | POA: Diagnosis not present

## 2018-06-03 DIAGNOSIS — S4992XA Unspecified injury of left shoulder and upper arm, initial encounter: Secondary | ICD-10-CM | POA: Diagnosis not present

## 2018-06-03 DIAGNOSIS — S3991XA Unspecified injury of abdomen, initial encounter: Secondary | ICD-10-CM | POA: Diagnosis not present

## 2018-06-03 DIAGNOSIS — M25512 Pain in left shoulder: Secondary | ICD-10-CM | POA: Diagnosis not present

## 2018-06-03 DIAGNOSIS — Y33XXXA Other specified events, undetermined intent, initial encounter: Secondary | ICD-10-CM | POA: Diagnosis not present

## 2018-06-03 DIAGNOSIS — S80811A Abrasion, right lower leg, initial encounter: Secondary | ICD-10-CM | POA: Diagnosis not present

## 2018-06-03 DIAGNOSIS — S81801A Unspecified open wound, right lower leg, initial encounter: Secondary | ICD-10-CM | POA: Diagnosis not present

## 2018-06-03 DIAGNOSIS — S6991XA Unspecified injury of right wrist, hand and finger(s), initial encounter: Secondary | ICD-10-CM | POA: Diagnosis not present

## 2018-06-03 DIAGNOSIS — S81811A Laceration without foreign body, right lower leg, initial encounter: Secondary | ICD-10-CM | POA: Diagnosis not present

## 2018-06-03 DIAGNOSIS — S3993XA Unspecified injury of pelvis, initial encounter: Secondary | ICD-10-CM | POA: Diagnosis not present

## 2018-06-03 DIAGNOSIS — M546 Pain in thoracic spine: Secondary | ICD-10-CM | POA: Diagnosis not present

## 2018-06-03 DIAGNOSIS — M25571 Pain in right ankle and joints of right foot: Secondary | ICD-10-CM | POA: Diagnosis not present

## 2018-06-03 DIAGNOSIS — S8991XA Unspecified injury of right lower leg, initial encounter: Secondary | ICD-10-CM | POA: Diagnosis not present

## 2018-06-03 DIAGNOSIS — S40811A Abrasion of right upper arm, initial encounter: Secondary | ICD-10-CM | POA: Diagnosis not present

## 2018-06-03 DIAGNOSIS — S60811A Abrasion of right wrist, initial encounter: Secondary | ICD-10-CM | POA: Diagnosis not present

## 2018-06-12 ENCOUNTER — Encounter: Payer: Self-pay | Admitting: Family Medicine

## 2018-06-12 ENCOUNTER — Ambulatory Visit (INDEPENDENT_AMBULATORY_CARE_PROVIDER_SITE_OTHER): Payer: Medicare HMO | Admitting: Family Medicine

## 2018-06-12 DIAGNOSIS — N3090 Cystitis, unspecified without hematuria: Secondary | ICD-10-CM | POA: Diagnosis not present

## 2018-06-12 DIAGNOSIS — R55 Syncope and collapse: Secondary | ICD-10-CM

## 2018-06-12 DIAGNOSIS — E119 Type 2 diabetes mellitus without complications: Secondary | ICD-10-CM | POA: Diagnosis not present

## 2018-06-12 LAB — POCT URINALYSIS DIPSTICK
BILIRUBIN UA: NEGATIVE
GLUCOSE UA: NEGATIVE
Ketones, UA: NEGATIVE
Leukocytes, UA: NEGATIVE
Nitrite, UA: NEGATIVE
Protein, UA: NEGATIVE
RBC UA: NEGATIVE
Spec Grav, UA: 1.01 (ref 1.010–1.025)
Urobilinogen, UA: 0.2 E.U./dL
pH, UA: 6 (ref 5.0–8.0)

## 2018-06-12 NOTE — Assessment & Plan Note (Signed)
Chronic stable Will continue With victoza and metformen. Will check a1c to determine if control is too restrictive.
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
## 2018-06-13 ENCOUNTER — Other Ambulatory Visit: Payer: Self-pay | Admitting: Family Medicine

## 2018-06-13 DIAGNOSIS — R419 Unspecified symptoms and signs involving cognitive functions and awareness: Secondary | ICD-10-CM | POA: Diagnosis not present

## 2018-06-13 DIAGNOSIS — R569 Unspecified convulsions: Secondary | ICD-10-CM | POA: Diagnosis not present

## 2018-06-13 LAB — HEMOGLOBIN A1C
ESTIMATED AVERAGE GLUCOSE: 111 mg/dL
HEMOGLOBIN A1C: 5.5 % (ref 4.8–5.6)

## 2018-06-22 ENCOUNTER — Other Ambulatory Visit: Payer: Self-pay | Admitting: Nurse Practitioner

## 2018-06-22 DIAGNOSIS — R569 Unspecified convulsions: Secondary | ICD-10-CM

## 2018-06-26 ENCOUNTER — Other Ambulatory Visit: Payer: Self-pay

## 2018-06-26 DIAGNOSIS — R69 Illness, unspecified: Secondary | ICD-10-CM | POA: Diagnosis not present

## 2018-06-26 MED ORDER — ALBUTEROL SULFATE (2.5 MG/3ML) 0.083% IN NEBU: 2.5000 mg | INHALATION_SOLUTION | Freq: Four times a day (QID) | RESPIRATORY_TRACT | 12 refills | Status: DC | PRN

## 2018-06-29 ENCOUNTER — Other Ambulatory Visit: Payer: Self-pay | Admitting: Family Medicine

## 2018-06-30 ENCOUNTER — Ambulatory Visit
Admission: RE | Admit: 2018-06-30 | Discharge: 2018-06-30 | Disposition: A | Discharge status: Home / Self Care | Payer: Medicare HMO | Source: Ambulatory Visit | Attending: Nurse Practitioner | Admitting: Nurse Practitioner

## 2018-06-30 ENCOUNTER — Other Ambulatory Visit: Payer: Self-pay | Admitting: Nurse Practitioner

## 2018-06-30 DIAGNOSIS — R569 Unspecified convulsions: Secondary | ICD-10-CM

## 2018-07-08 ENCOUNTER — Other Ambulatory Visit: Payer: Self-pay | Admitting: Family Medicine

## 2018-07-08 DIAGNOSIS — E785 Hyperlipidemia, unspecified: Secondary | ICD-10-CM

## 2018-07-10 ENCOUNTER — Ambulatory Visit (INDEPENDENT_AMBULATORY_CARE_PROVIDER_SITE_OTHER): Payer: Medicare HMO | Admitting: Family Medicine

## 2018-07-10 ENCOUNTER — Encounter: Payer: Self-pay | Admitting: Family Medicine

## 2018-07-10 VITALS — BP 120/78 | HR 68 | Ht 62.0 in | Wt 142.0 lb

## 2018-07-10 DIAGNOSIS — J440 Chronic obstructive pulmonary disease with acute lower respiratory infection: Secondary | ICD-10-CM | POA: Diagnosis not present

## 2018-07-10 DIAGNOSIS — J209 Acute bronchitis, unspecified: Secondary | ICD-10-CM

## 2018-07-10 DIAGNOSIS — R69 Illness, unspecified: Secondary | ICD-10-CM | POA: Diagnosis not present

## 2018-07-10 MED ORDER — LEVOFLOXACIN 500 MG PO TABS: 500.0000 mg | ORAL_TABLET | Freq: Every day | ORAL | 0 refills | Status: DC

## 2018-07-10 MED ORDER — ALBUTEROL SULFATE (2.5 MG/3ML) 0.083% IN NEBU: 2.5000 mg | INHALATION_SOLUTION | Freq: Four times a day (QID) | RESPIRATORY_TRACT | 12 refills | Status: AC | PRN

## 2018-07-10 MED ORDER — PREDNISONE 10 MG PO TABS: 10.0000 mg | ORAL_TABLET | Freq: Every day | ORAL | 0 refills | Status: DC

## 2018-07-10 NOTE — Progress Notes (Signed)
Name: Judy Bolton   MRN: [id number]    DOB: Mar 05, 1946   Date:07/10/2018       Progress Note  Subjective  Chief Complaint  Chief Complaint  Patient presents with  . Sinusitis    thick/ white production. Wheezing- having to use inhalers more. Took last dose of Pred yesterday    Cough  This is a new problem. The current episode started in the past 7 days. The problem has been gradually worsening. The cough is productive of sputum. Associated symptoms include ear pain, a fever, nasal congestion, postnasal drip, shortness of breath and wheezing. Pertinent negatives include no chest pain, chills, ear congestion, headaches, heartburn, hemoptysis, myalgias, rash, rhinorrhea, sore throat, sweats or weight loss. The symptoms are aggravated by pollens. She has tried a beta-agonist inhaler, ipratropium inhaler, steroid inhaler and oral steroids for the symptoms. The treatment provided mild relief. Her past medical history is significant for COPD. There is no history of environmental allergies.    No problem-specific Assessment & Plan notes found for this encounter.   Past Medical History:  Diagnosis Date  . Acid reflux   . Arthritis   . Asthma   . Back pain   . Back pain   . COPD (chronic obstructive pulmonary disease) (HCC)   . Diabetes mellitus without complication (HCC)   . Hyperlipidemia   . Hypertension   . Stroke [facility])    March of 2017 vocabulary    Past Surgical History:  Procedure Laterality Date  . ABDOMINAL HYSTERECTOMY    . ANTERIOR (CYSTOCELE) AND POSTERIOR REPAIR (RECTOCELE) WITH XENFORM GRAFT AND SACROSPINOUS FIXATION    . APPENDECTOMY    . CTR Bilateral   . LUMBAR LAMINECTOMY/DECOMPRESSION MICRODISCECTOMY N/A 07/27/2017   Procedure: LUMBAR LAMINECTOMY/DECOMPRESSION MICRODISCECTOMY 3 LEVELS-L3-S1;  Surgeon: Venetia Night, MD;  Location: [facility] ORS;  Service: Neurosurgery;  Laterality: N/A;  . ROTATOR CUFF REPAIR Right     Family History  Problem Relation Age of  Onset  . Heart attack Mother   . Cancer Father   . Heart attack Maternal Grandmother   . Heart attack Maternal Grandfather   . Breast cancer Paternal Grandmother     Social History   Socioeconomic History  . Marital status: Widowed    Spouse name: Not on file  . Number of children: 1  . Years of education: some college  . Highest education level: 12th grade  Occupational History  . Occupation: Retired  Engineer, production  . Financial resource strain: Not hard at all  . Food insecurity:    Worry: Never true    Inability: Never true  . Transportation needs:    Medical: No    Non-medical: No  Tobacco Use  . Smoking status: Former Smoker    Packs/day: 1.50    Years: 28.00    Pack years: 42.00    Types: Cigarettes    Last attempt to quit: 08/14/2000    Years since quitting: 17.9  . Smokeless tobacco: Never Used  . Tobacco comment: smoking cessatiuon materials not required  Substance and Sexual Activity  . Alcohol use: No  . Drug use: No  . Sexual activity: Not Currently  Lifestyle  . Physical activity:    Days per week: 0 days    Minutes per session: 0 min  . Stress: Not at all  Relationships  . Social connections:    Talks on phone: Patient refused    Gets together: Patient refused    Attends religious service: Patient refused  Active member of club or organization: Patient refused    Attends meetings of clubs or organizations: Patient refused    Relationship status: Widowed  . Intimate partner violence:    Fear of current or ex partner: No    Emotionally abused: No    Physically abused: No    Forced sexual activity: No  Other Topics Concern  . Not on file  Social History Narrative  . Not on file    Allergies  Allergen Reactions  . Codeine Itching  . Latex Itching and Swelling  . Meloxicam Swelling  . Penicillins Other (See Comments)    Paralysis  Has patient had a PCN reaction causing immediate rash, facial/tongue/throat swelling, SOB or lightheadedness  with hypotension: No Has patient had a PCN reaction causing severe rash involving mucus membranes or skin necrosis: No Has patient had a PCN reaction that required hospitalization Yes Has patient had a PCN reaction occurring within the last 10 years: No If all of the above answers are "NO", then may proceed with Cephalosporin use.    Outpatient Medications Prior to Visit  Medication Sig Dispense Refill  . albuterol (PROVENTIL HFA;VENTOLIN HFA) 108 (90 Base) MCG/ACT inhaler Inhale 2 puffs into the lungs every 6 (six) hours as needed for wheezing or shortness of breath.    Marland Kitchen atorvastatin (LIPITOR) 40 MG tablet TAKE 1 TABLET BY MOUTH EVERY DAY 90 tablet 0  . budesonide-formoterol (SYMBICORT) 160-4.5 MCG/ACT inhaler Inhale 2 puffs into the lungs 2 (two) times daily. 1 Inhaler 3  . etodolac (LODINE XL) 500 MG 24 hr tablet TAKE 1 TABLET (500 MG TOTAL) DAILY BY MOUTH. 30 tablet 0  . gemfibrozil (LOPID) 600 MG tablet Take 1 tablet (600 mg total) by mouth 2 (two) times daily. 180 tablet 2  . hydrochlorothiazide (HYDRODIURIL) 25 MG tablet Take 1 tablet (25 mg total) by mouth daily. 90 tablet 1  . loratadine (CLARITIN) 10 MG tablet Take 10 mg by mouth daily.    . metFORMIN (GLUCOPHAGE) 1000 MG tablet Take 1,000 mg by mouth 2 (two) times daily with a meal.    . metoprolol tartrate (LOPRESSOR) 25 MG tablet Take 1 tablet (25 mg total) by mouth 2 (two) times daily. 180 tablet 1  . montelukast (SINGULAIR) 10 MG tablet Take 1 tablet by mouth daily.    . ranitidine (ZANTAC) 150 MG tablet Take 2 tablets (300 mg total) by mouth daily as needed for heartburn (takes 2 tablets if needed). 90 tablet 2  . Tiotropium Bromide Monohydrate (SPIRIVA RESPIMAT) 2.5 MCG/ACT AERS Inhale 1 puff into the lungs daily.    . vitamin B-12 (CYANOCOBALAMIN) 1000 MCG tablet Take 500 mcg by mouth at bedtime.     Marland Kitchen albuterol (PROVENTIL) (2.5 MG/3ML) 0.083% nebulizer solution Take 3 mLs (2.5 mg total) by nebulization every 6 (six) hours as  needed for wheezing or shortness of breath. 75 mL 12  . fexofenadine (ALLEGRA) 180 MG tablet Take 180 mg by mouth daily.    Marland Kitchen ALPRAZolam (XANAX) 0.25 MG tablet Take 1 tablet (0.25 mg total) by mouth once as needed for up to 1 dose for anxiety. (Patient not taking: Reported on 07/10/2018) 12 tablet 0  . liraglutide (VICTOZA) 18 MG/3ML SOPN Inject 18 mg into the skin daily as needed (FOR ELEVATED BLOOD SUGARS).     Marland Kitchen albuterol (PROVENTIL HFA;VENTOLIN HFA) 108 (90 Base) MCG/ACT inhaler Inhale 2 puffs into the lungs every 6 (six) hours as needed for wheezing or shortness of breath. 1 Inhaler 3  .  Fluticasone-Umeclidin-Vilant (TRELEGY ELLIPTA) 100-62.5-25 MCG/INH AEPB Inhale 1 puff into the lungs daily.     Facility-Administered Medications Prior to Visit  Medication Dose Route Frequency Provider Last Rate Last Dose  . ipratropium-albuterol (DUONEB) 0.5-2.5 (3) MG/3ML nebulizer solution 3 mL  3 mL Nebulization Once Duanne Limerick, MD        Review of Systems  Constitutional: Positive for fever. Negative for chills, malaise/fatigue and weight loss.  HENT: Positive for ear pain and postnasal drip. Negative for ear discharge, rhinorrhea and sore throat.   Eyes: Negative for blurred vision.  Respiratory: Positive for cough, shortness of breath and wheezing. Negative for hemoptysis and sputum production.   Cardiovascular: Negative for chest pain, palpitations and leg swelling.  Gastrointestinal: Negative for abdominal pain, blood in stool, constipation, diarrhea, heartburn, melena and nausea.  Genitourinary: Negative for dysuria, frequency, hematuria and urgency.  Musculoskeletal: Negative for back pain, joint pain, myalgias and neck pain.  Skin: Negative for rash.  Neurological: Negative for dizziness, tingling, sensory change, focal weakness and headaches.  Endo/Heme/Allergies: Negative for environmental allergies and polydipsia. Does not bruise/bleed easily.  Psychiatric/Behavioral: Negative for  depression and suicidal ideas. The patient is not nervous/anxious and does not have insomnia.      Objective  Vitals:   07/10/18 1403  BP: 120/78  Pulse: 68  SpO2: 97%  Weight: 142 lb (64.4 kg)  Height: 5\' 2"  (1.575 m)    Physical Exam  Constitutional: She is oriented to person, place, and time. She appears well-developed and well-nourished.  HENT:  Head: Normocephalic.  Right Ear: External ear normal.  Left Ear: External ear normal.  Nose: Nose normal.  Mouth/Throat: Oropharynx is clear and moist.  Eyes: Pupils are equal, round, and reactive to light. Conjunctivae and EOM are normal. Lids are everted and swept, no foreign bodies found. Left eye exhibits no hordeolum. No foreign body present in the left eye. Right conjunctiva is not injected. Left conjunctiva is not injected. No scleral icterus.  Neck: Normal range of motion. Neck supple. No JVD present. No tracheal deviation present. No thyromegaly present.  Cardiovascular: Normal rate, regular rhythm, normal heart sounds and intact distal pulses. Exam reveals no gallop and no friction rub.  No murmur heard. Pulmonary/Chest: Effort normal and breath sounds normal. No respiratory distress. She has no wheezes. She has no rales.  Abdominal: Soft. Bowel sounds are normal. She exhibits no mass. There is no hepatosplenomegaly. There is no tenderness. There is no rebound and no guarding.  Musculoskeletal: Normal range of motion. She exhibits no edema or tenderness.  Lymphadenopathy:    She has no cervical adenopathy.  Neurological: She is alert and oriented to person, place, and time. She has normal strength. She displays normal reflexes. No cranial nerve deficit.  Skin: Skin is warm. No rash noted.  Psychiatric: She has a normal mood and affect. Her mood appears not anxious. She does not exhibit a depressed mood.  Nursing note and vitals reviewed.     Assessment & Plan  Problem List Items Addressed This Visit    None    Visit  Diagnoses    COPD with acute bronchitis (HCC)    -  Primary   start Levaquin 500mg /continue inhalers and breathing treatments/gave a breathing Tx in office/restart prednisone/continue singulair and loratadine/[place] next    Relevant Medications   Tiotropium Bromide Monohydrate (SPIRIVA RESPIMAT) 2.5 MCG/ACT AERS   montelukast (SINGULAIR) 10 MG tablet   loratadine (CLARITIN) 10 MG tablet   levofloxacin (LEVAQUIN) 500 MG  tablet   predniSONE (DELTASONE) 10 MG tablet   albuterol (PROVENTIL) (2.5 MG/3ML) 0.083% nebulizer solution      Meds ordered this encounter  Medications  . levofloxacin (LEVAQUIN) 500 MG tablet    Sig: Take 1 tablet (500 mg total) by mouth daily.    Dispense:  7 tablet    Refill:  0  . predniSONE (DELTASONE) 10 MG tablet    Sig: Take 1 tablet (10 mg total) by mouth daily with breakfast.    Dispense:  30 tablet    Refill:  0    Taper 4,4,4,3,3,3,2,2,2,1,1,1.  . albuterol (PROVENTIL) (2.5 MG/3ML) 0.083% nebulizer solution    Sig: Take 3 mLs (2.5 mg total) by nebulization every 6 (six) hours as needed for wheezing or shortness of breath.    Dispense:  75 mL    Refill:  12      Dr. [facility]  07/10/18

## 2018-08-08 DIAGNOSIS — J449 Chronic obstructive pulmonary disease, unspecified: Secondary | ICD-10-CM | POA: Diagnosis not present

## 2018-08-10 DIAGNOSIS — R69 Illness, unspecified: Secondary | ICD-10-CM | POA: Diagnosis not present

## 2018-08-11 ENCOUNTER — Encounter: Payer: Self-pay | Admitting: Family Medicine

## 2018-08-11 ENCOUNTER — Ambulatory Visit (INDEPENDENT_AMBULATORY_CARE_PROVIDER_SITE_OTHER): Payer: Medicare HMO | Admitting: Family Medicine

## 2018-08-11 VITALS — BP 134/80 | HR 76 | Ht 62.0 in | Wt 140.0 lb

## 2018-08-11 DIAGNOSIS — E119 Type 2 diabetes mellitus without complications: Secondary | ICD-10-CM

## 2018-08-11 NOTE — Progress Notes (Addendum)
Name: Judy Bolton   MRN: [id number]    DOB: December 19, 1945   Date:08/11/2018       Progress Note  Subjective  Chief Complaint  Chief Complaint  Patient presents with  . Diabetes    follow up after stopping victoza    Diabetes  She presents for her follow-up diabetic visit. She has type 2 diabetes mellitus. Her disease course has been stable. Pertinent negatives for hypoglycemia include no confusion, dizziness, headaches, hunger, mood changes, nervousness/anxiousness, pallor, seizures, sleepiness, speech difficulty, sweats or tremors. Pertinent negatives for diabetes include no blurred vision, no chest pain, no fatigue, no foot paresthesias, no foot ulcerations, no polydipsia, no polyphagia, no polyuria, no visual change, no weakness and no weight loss. There are no hypoglycemic complications. Symptoms are stable. There are no diabetic complications. Current diabetic treatment includes oral agent (monotherapy) (recently stopped victoza). She is compliant with treatment some of the time. Her weight is stable. She is following a generally healthy diet. She participates in exercise intermittently. Her breakfast blood glucose is taken between 8-9 am. Her breakfast blood glucose range is generally 130-140 mg/dl. An ACE inhibitor/angiotensin II receptor blocker is not being taken. She does not see a podiatrist.Eye exam is not current.    Type 2 diabetes mellitus without complication, without long-term current use of insulin (HCC) Chronic Stable until solu medrol injection. FBS 130 range. Continue metformen with prn insulin coverage. Check a1c in 6 weeks.   Past Medical History:  Diagnosis Date  . Acid reflux   . Arthritis   . Asthma   . Back pain   . Back pain   . COPD (chronic obstructive pulmonary disease) (HCC)   . Diabetes mellitus without complication (HCC)   . Hyperlipidemia   . Hypertension   . Stroke [facility])    March of 2017 vocabulary    Past Surgical History:  Procedure Laterality  Date  . ABDOMINAL HYSTERECTOMY    . ANTERIOR (CYSTOCELE) AND POSTERIOR REPAIR (RECTOCELE) WITH XENFORM GRAFT AND SACROSPINOUS FIXATION    . APPENDECTOMY    . CTR Bilateral   . LUMBAR LAMINECTOMY/DECOMPRESSION MICRODISCECTOMY N/A 07/27/2017   Procedure: LUMBAR LAMINECTOMY/DECOMPRESSION MICRODISCECTOMY 3 LEVELS-L3-S1;  Surgeon: Venetia Night, MD;  Location: [facility] ORS;  Service: Neurosurgery;  Laterality: N/A;  . ROTATOR CUFF REPAIR Right     Family History  Problem Relation Age of Onset  . Heart attack Mother   . Cancer Father   . Heart attack Maternal Grandmother   . Heart attack Maternal Grandfather   . Breast cancer Paternal Grandmother     Social History   Socioeconomic History  . Marital status: Widowed    Spouse name: Not on file  . Number of children: 1  . Years of education: some college  . Highest education level: 12th grade  Occupational History  . Occupation: Retired  Engineer, production  . Financial resource strain: Not hard at all  . Food insecurity:    Worry: Never true    Inability: Never true  . Transportation needs:    Medical: No    Non-medical: No  Tobacco Use  . Smoking status: Former Smoker    Packs/day: 1.50    Years: 28.00    Pack years: 42.00    Types: Cigarettes    Last attempt to quit: 08/14/2000    Years since quitting: 18.0  . Smokeless tobacco: Never Used  . Tobacco comment: smoking cessatiuon materials not required  Substance and Sexual Activity  . Alcohol use: No  .  Drug use: No  . Sexual activity: Not Currently  Lifestyle  . Physical activity:    Days per week: 0 days    Minutes per session: 0 min  . Stress: Not at all  Relationships  . Social connections:    Talks on phone: Patient refused    Gets together: Patient refused    Attends religious service: Patient refused    Active member of club or organization: Patient refused    Attends meetings of clubs or organizations: Patient refused    Relationship status: Widowed  .  Intimate partner violence:    Fear of current or ex partner: No    Emotionally abused: No    Physically abused: No    Forced sexual activity: No  Other Topics Concern  . Not on file  Social History Narrative  . Not on file    Allergies  Allergen Reactions  . Codeine Itching  . Latex Itching and Swelling  . Meloxicam Swelling  . Penicillins Other (See Comments)    Paralysis  Has patient had a PCN reaction causing immediate rash, facial/tongue/throat swelling, SOB or lightheadedness with hypotension: No Has patient had a PCN reaction causing severe rash involving mucus membranes or skin necrosis: No Has patient had a PCN reaction that required hospitalization Yes Has patient had a PCN reaction occurring within the last 10 years: No If all of the above answers are "NO", then may proceed with Cephalosporin use.    Outpatient Medications Prior to Visit  Medication Sig Dispense Refill  . albuterol (PROVENTIL HFA;VENTOLIN HFA) 108 (90 Base) MCG/ACT inhaler Inhale 2 puffs into the lungs every 6 (six) hours as needed for wheezing or shortness of breath.    Marland Kitchen albuterol (PROVENTIL) (2.5 MG/3ML) 0.083% nebulizer solution Take 3 mLs (2.5 mg total) by nebulization every 6 (six) hours as needed for wheezing or shortness of breath. 75 mL 12  . atorvastatin (LIPITOR) 40 MG tablet TAKE 1 TABLET BY MOUTH EVERY DAY 90 tablet 0  . budesonide-formoterol (SYMBICORT) 160-4.5 MCG/ACT inhaler Inhale 2 puffs into the lungs 2 (two) times daily. 1 Inhaler 3  . etodolac (LODINE XL) 500 MG 24 hr tablet TAKE 1 TABLET (500 MG TOTAL) DAILY BY MOUTH. 30 tablet 0  . gemfibrozil (LOPID) 600 MG tablet Take 1 tablet (600 mg total) by mouth 2 (two) times daily. 180 tablet 2  . hydrochlorothiazide (HYDRODIURIL) 25 MG tablet Take 1 tablet (25 mg total) by mouth daily. 90 tablet 1  . loratadine (CLARITIN) 10 MG tablet Take 10 mg by mouth daily.    . metFORMIN (GLUCOPHAGE) 1000 MG tablet Take 1,000 mg by mouth 2 (two) times  daily with a meal.    . metoprolol tartrate (LOPRESSOR) 25 MG tablet Take 1 tablet (25 mg total) by mouth 2 (two) times daily. 180 tablet 1  . montelukast (SINGULAIR) 10 MG tablet Take 1 tablet by mouth daily.    . predniSONE (DELTASONE) 10 MG tablet Take 1 tablet (10 mg total) by mouth daily with breakfast. 30 tablet 0  . ranitidine (ZANTAC) 150 MG tablet Take 2 tablets (300 mg total) by mouth daily as needed for heartburn (takes 2 tablets if needed). 90 tablet 2  . vitamin B-12 (CYANOCOBALAMIN) 1000 MCG tablet Take 500 mcg by mouth at bedtime.     . ALPRAZolam (XANAX) 0.25 MG tablet Take 1 tablet (0.25 mg total) by mouth once as needed for up to 1 dose for anxiety. (Patient not taking: Reported on 07/10/2018) 12 tablet  0  . liraglutide (VICTOZA) 18 MG/3ML SOPN Inject 18 mg into the skin daily as needed (FOR ELEVATED BLOOD SUGARS).     . Tiotropium Bromide Monohydrate (SPIRIVA RESPIMAT) 2.5 MCG/ACT AERS Inhale 1 puff into the lungs daily.    Marland Kitchen levofloxacin (LEVAQUIN) 500 MG tablet Take 1 tablet (500 mg total) by mouth daily. 7 tablet 0   Facility-Administered Medications Prior to Visit  Medication Dose Route Frequency Provider Last Rate Last Dose  . ipratropium-albuterol (DUONEB) 0.5-2.5 (3) MG/3ML nebulizer solution 3 mL  3 mL Nebulization Once Duanne Limerick, MD        Review of Systems  Constitutional: Negative for chills, fatigue, fever, malaise/fatigue and weight loss.  HENT: Negative for ear discharge, ear pain and sore throat.   Eyes: Negative for blurred vision.  Respiratory: Negative for cough, sputum production, shortness of breath and wheezing.   Cardiovascular: Negative for chest pain, palpitations and leg swelling.  Gastrointestinal: Negative for abdominal pain, blood in stool, constipation, diarrhea, heartburn, melena and nausea.  Genitourinary: Negative for dysuria, frequency, hematuria and urgency.  Musculoskeletal: Negative for back pain, joint pain, myalgias and neck pain.   Skin: Negative for pallor and rash.  Neurological: Negative for dizziness, tingling, tremors, sensory change, focal weakness, seizures, speech difficulty, weakness and headaches.  Endo/Heme/Allergies: Negative for environmental allergies, polydipsia and polyphagia. Does not bruise/bleed easily.  Psychiatric/Behavioral: Negative for confusion, depression and suicidal ideas. The patient is not nervous/anxious and does not have insomnia.      Objective  Vitals:   08/11/18 1012  BP: 134/80  Pulse: 76  Weight: 140 lb (63.5 kg)  Height: 5\' 2"  (1.575 m)    Physical Exam  Constitutional: She is oriented to person, place, and time. She appears well-developed and well-nourished.  HENT:  Head: Normocephalic.  Right Ear: External ear normal.  Left Ear: External ear normal.  Mouth/Throat: Oropharynx is clear and moist.  Eyes: Pupils are equal, round, and reactive to light. Conjunctivae and EOM are normal. Lids are everted and swept, no foreign bodies found. Left eye exhibits no hordeolum. No foreign body present in the left eye. Right conjunctiva is not injected. Left conjunctiva is not injected. No scleral icterus.  Neck: Normal range of motion. Neck supple. No JVD present. No tracheal deviation present. No thyromegaly present.  Cardiovascular: Normal rate, regular rhythm, normal heart sounds and intact distal pulses. Exam reveals no gallop and no friction rub.  No murmur heard. Pulmonary/Chest: Effort normal and breath sounds normal. No respiratory distress. She has no wheezes. She has no rales.  Abdominal: Soft. Bowel sounds are normal. She exhibits no mass. There is no hepatosplenomegaly. There is no tenderness. There is no rebound and no guarding.  Musculoskeletal: Normal range of motion. She exhibits no edema or tenderness.  Lymphadenopathy:    She has no cervical adenopathy.  Neurological: She is alert and oriented to person, place, and time. She has normal strength. She displays normal  reflexes. No cranial nerve deficit.  Skin: Skin is warm. No rash noted.  Psychiatric: She has a normal mood and affect. Her mood appears not anxious. She does not exhibit a depressed mood.  Nursing note and vitals reviewed.     Assessment & Plan  Problem List Items Addressed This Visit      Endocrine   Type 2 diabetes mellitus without complication, without long-term current use of insulin (HCC) - Primary    Chronic Stable until solu medrol injection. FBS 130 range. Continue metformen with prn  insulin coverage. Check a1c in 6 weeks.         No orders of the defined types were placed in this encounter.     Dr. Hayden [facility]  08/11/18

## 2018-08-11 NOTE — Assessment & Plan Note (Addendum)
Chronic Stable until solu medrol injection. FBS 130 range. Continue metformen with prn insulin coverage. Check a1c in 6 weeks.

## 2018-08-14 ENCOUNTER — Ambulatory Visit: Payer: Medicare HMO | Admitting: Family Medicine

## 2018-08-22 ENCOUNTER — Other Ambulatory Visit: Payer: Self-pay

## 2018-08-22 ENCOUNTER — Telehealth: Payer: Self-pay

## 2018-08-22 DIAGNOSIS — R059 Cough, unspecified: Secondary | ICD-10-CM

## 2018-08-22 DIAGNOSIS — R05 Cough: Secondary | ICD-10-CM

## 2018-08-22 LAB — HM DIABETES EYE EXAM

## 2018-08-22 MED ORDER — BENZONATATE 100 MG PO CAPS: 100.0000 mg | ORAL_CAPSULE | Freq: Two times a day (BID) | ORAL | 0 refills | Status: DC | PRN

## 2018-08-22 NOTE — Telephone Encounter (Signed)
Pt wanted tessalon perles sent into [facility]

## 2018-08-23 ENCOUNTER — Other Ambulatory Visit: Payer: Self-pay | Admitting: Family Medicine

## 2018-08-29 ENCOUNTER — Other Ambulatory Visit: Payer: Self-pay

## 2018-08-29 ENCOUNTER — Encounter: Payer: Self-pay | Admitting: *Deleted

## 2018-08-30 ENCOUNTER — Ambulatory Visit (INDEPENDENT_AMBULATORY_CARE_PROVIDER_SITE_OTHER): Payer: Medicare HMO

## 2018-08-30 DIAGNOSIS — Z23 Encounter for immunization: Secondary | ICD-10-CM

## 2018-08-31 DIAGNOSIS — R062 Wheezing: Secondary | ICD-10-CM | POA: Diagnosis not present

## 2018-08-31 DIAGNOSIS — R05 Cough: Secondary | ICD-10-CM | POA: Diagnosis not present

## 2018-08-31 DIAGNOSIS — R0602 Shortness of breath: Secondary | ICD-10-CM | POA: Diagnosis not present

## 2018-09-04 NOTE — Discharge Instructions (Signed)
INSTRUCTIONS FOLLOWING OCULOPLASTIC SURGERY °AMY M. FOWLER, MD ° °AFTER YOUR EYE SURGERY, THER ARE MANY THINGS THWIHC YOU, THE PATIENT, CAN DO TO ASSURE THE BEST POSSIBLE RESULT FROM YOUR OPERATION.  THIS SHEET SHOULD BE REFERRED TO WHENEVER QUESTIONS ARISE.  IF THERE ARE ANY QUESTIONS NOT ANSWERED HERE, DO NOT HESITATE TO CALL OUR OFFICE AT [phone] OR [phone].  THERE IS ALWAYS [facility] AVAILABLE TO CALL IF QUESTIONS OR PROBLEMS ARISE. ° °VISION: Your vision may be blurred and out of focus after surgery until you are able to stop using your ointment, swelling resolves and your eye(s) heal. This may take 1 to 2 weeks at the least.  If your vision becomes gradually more dim or dark, this is not normal and you need to call our office immediately. ° °EYE CARE: For the first 48 hours after surgery, use ice packs frequently - “20 minutes on, 20 minutes off” - to help reduce swelling and bruising.  Small bags of frozen peas or corn make good ice packs along with cloths soaked in ice water.  If you are wearing a patch or other type of dressing following surgery, keep this on for the amount of time specified by your doctor.  For the first week following surgery, you will need to treat your stitches with great care.  If is OK to shower, but take care to not allow soapy water to run into your eye(s) to help reduce changes of infection.  You may gently clean the eyelashes and around the eye(s) with cotton balls and sterile water, BUT DO NOT RUB THE STITCHES VIGOROUSLY.  Keeping your stitches moist with ointment will help promote healing with minimal scar formation. ° °ACTIVITY: When you leave the surgery center, you should go home, rest and be inactive.  The eye(s) may feel scratchy and keeping the eyes closed will allow for faster healing.  The first week following surgery, avoid straining (anything making the face turn red) or lifting over 20 pounds.  Additionally, avoid bending which causes your head to go below  your waist.  Using your eyes will NOT harm them, so feel free to read, watch television, use the computer, etc as desired.  Driving depends on each individual, so check with your doctor if you have questions about driving. ° °MEDICATIONS:  You will be given a prescription for an ointment to use 4 times a day on your stitches.  You can use the ointment in your eyes if they feel scratchy or irritated.  If you eyelid(s) don’t close completely when you sleep, put some ointment in your eyes before bedtime. ° °EMERGENCY: If you experience SEVERE EYE PAIN OR HEADACHE UNRELIEVED BY TYLENOL OR PERCOCET, NAUSEA OR VOMITING, WORSENING REDNESS, OR WORSENING VISION (ESPECIALLY VISION THAT WA INITIALLY BETTER) CALL [phone] OR [phone] DURING BUSINESS HOURS OR AFTER HOURS. ° °General Anesthesia, Adult, Care After °These instructions provide you with information about caring for yourself after your procedure. Your health care provider may also give you more specific instructions. Your treatment has been planned according to current medical practices, but problems sometimes occur. Call your health care provider if you have any problems or questions after your procedure. °What can I expect after the procedure? °After the procedure, it is common to have: °· Vomiting. °· A sore throat. °· Mental slowness. ° °It is common to feel: °· Nauseous. °· Cold or shivery. °· Sleepy. °· Tired. °· Sore or achy, even in parts of your body where you did not have surgery. ° °  Follow these instructions at home: °For at least 24 hours after the procedure: °· Do not: °? Participate in activities where you could fall or become injured. °? Drive. °? Use heavy machinery. °? Drink alcohol. °? Take sleeping pills or medicines that cause drowsiness. °? Make important decisions or sign legal documents. °? Take care of children on your own. °· Rest. °Eating and drinking °· If you vomit, drink water, juice, or soup when you can drink without  vomiting. °· Drink enough fluid to keep your urine clear or pale yellow. °· Make sure you have little or no nausea before eating solid foods. °· Follow the diet recommended by your health care provider. °General instructions °· Have a responsible adult stay with you until you are awake and alert. °· Return to your normal activities as told by your health care provider. Ask your health care provider what activities are safe for you. °· Take over-the-counter and prescription medicines only as told by your health care provider. °· If you smoke, do not smoke without supervision. °· Keep all follow-up visits as told by your health care provider. This is important. °Contact a health care provider if: °· You continue to have nausea or vomiting at home, and medicines are not helpful. °· You cannot drink fluids or start eating again. °· You cannot urinate after 8-12 hours. °· You develop a skin rash. °· You have fever. °· You have increasing redness at the site of your procedure. °Get help right away if: °· You have difficulty breathing. °· You have chest pain. °· You have unexpected bleeding. °· You feel that you are having a life-threatening or urgent problem. °This information is not intended to replace advice given to you by your health care provider. Make sure you discuss any questions you have with your health care provider. °Document Released: 02/28/2001 Document Revised: 04/26/2016 Document Reviewed: 11/06/2015 °Elsevier Interactive Patient Education © 2018 [employer] ° °

## 2018-09-05 ENCOUNTER — Encounter
Admission: RE | Disposition: A | Discharge status: Home / Self Care | Payer: Self-pay | Source: Ambulatory Visit | Attending: Ophthalmology

## 2018-09-05 ENCOUNTER — Ambulatory Visit: Payer: Medicare HMO | Admitting: Anesthesiology

## 2018-09-05 ENCOUNTER — Other Ambulatory Visit: Payer: Self-pay

## 2018-09-05 ENCOUNTER — Ambulatory Visit
Admission: RE | Admit: 2018-09-05 | Discharge: 2018-09-05 | Disposition: A | Discharge status: Home / Self Care | Payer: Medicare HMO | Source: Ambulatory Visit | Attending: Ophthalmology | Admitting: Ophthalmology

## 2018-09-05 DIAGNOSIS — H02834 Dermatochalasis of left upper eyelid: Secondary | ICD-10-CM | POA: Insufficient documentation

## 2018-09-05 DIAGNOSIS — E7800 Pure hypercholesterolemia, unspecified: Secondary | ICD-10-CM | POA: Insufficient documentation

## 2018-09-05 DIAGNOSIS — I110 Hypertensive heart disease with heart failure: Secondary | ICD-10-CM | POA: Diagnosis not present

## 2018-09-05 DIAGNOSIS — E119 Type 2 diabetes mellitus without complications: Secondary | ICD-10-CM | POA: Diagnosis not present

## 2018-09-05 DIAGNOSIS — I2510 Atherosclerotic heart disease of native coronary artery without angina pectoris: Secondary | ICD-10-CM | POA: Insufficient documentation

## 2018-09-05 DIAGNOSIS — Z87891 Personal history of nicotine dependence: Secondary | ICD-10-CM | POA: Insufficient documentation

## 2018-09-05 DIAGNOSIS — Z85048 Personal history of other malignant neoplasm of rectum, rectosigmoid junction, and anus: Secondary | ICD-10-CM | POA: Diagnosis not present

## 2018-09-05 DIAGNOSIS — Z79899 Other long term (current) drug therapy: Secondary | ICD-10-CM | POA: Insufficient documentation

## 2018-09-05 DIAGNOSIS — I509 Heart failure, unspecified: Secondary | ICD-10-CM | POA: Diagnosis not present

## 2018-09-05 DIAGNOSIS — H02403 Unspecified ptosis of bilateral eyelids: Secondary | ICD-10-CM | POA: Diagnosis not present

## 2018-09-05 DIAGNOSIS — Z7984 Long term (current) use of oral hypoglycemic drugs: Secondary | ICD-10-CM | POA: Insufficient documentation

## 2018-09-05 DIAGNOSIS — Z7982 Long term (current) use of aspirin: Secondary | ICD-10-CM | POA: Diagnosis not present

## 2018-09-05 DIAGNOSIS — K219 Gastro-esophageal reflux disease without esophagitis: Secondary | ICD-10-CM | POA: Diagnosis not present

## 2018-09-05 DIAGNOSIS — J449 Chronic obstructive pulmonary disease, unspecified: Secondary | ICD-10-CM | POA: Insufficient documentation

## 2018-09-05 DIAGNOSIS — H02831 Dermatochalasis of right upper eyelid: Secondary | ICD-10-CM | POA: Diagnosis not present

## 2018-09-05 DIAGNOSIS — G4730 Sleep apnea, unspecified: Secondary | ICD-10-CM | POA: Insufficient documentation

## 2018-09-05 HISTORY — PX: PTOSIS REPAIR: SHX6568

## 2018-09-05 HISTORY — DX: Unspecified convulsions: R56.9

## 2018-09-05 HISTORY — PX: BROW LIFT: SHX178

## 2018-09-05 HISTORY — DX: Sleep apnea, unspecified: G47.30

## 2018-09-05 LAB — GLUCOSE, CAPILLARY
GLUCOSE-CAPILLARY: 163 mg/dL — AB (ref 70–99)
Glucose-Capillary: 139 mg/dL — ABNORMAL HIGH (ref 70–99)

## 2018-09-05 SURGERY — BLEPHAROPLASTY
Anesthesia: Monitor Anesthesia Care | Site: Eye | Laterality: Bilateral

## 2018-09-05 MED ORDER — ERYTHROMYCIN 5 MG/GM OP OINT: TOPICAL_OINTMENT | OPHTHALMIC | 2 refills | Status: DC

## 2018-09-05 MED ORDER — MIDAZOLAM HCL 2 MG/2ML IJ SOLN
INTRAMUSCULAR | Status: DC | PRN
  Administered 2018-09-05 (×2): 1 mg via INTRAVENOUS

## 2018-09-05 MED ORDER — PROPOFOL 500 MG/50ML IV EMUL: INTRAVENOUS | Status: DC | PRN

## 2018-09-05 MED ORDER — LIDOCAINE-EPINEPHRINE 2 %-1:100000 IJ SOLN
INTRAMUSCULAR | Status: DC | PRN
  Administered 2018-09-05: 2 mL via OPHTHALMIC

## 2018-09-05 MED ORDER — ONDANSETRON HCL 4 MG/2ML IJ SOLN: 4.0000 mg | Freq: Once | INTRAMUSCULAR | Status: DC | PRN

## 2018-09-05 MED ORDER — ERYTHROMYCIN 5 MG/GM OP OINT
TOPICAL_OINTMENT | OPHTHALMIC | Status: DC | PRN
  Administered 2018-09-05: 1 via OPHTHALMIC

## 2018-09-05 MED ORDER — ACETAMINOPHEN 160 MG/5ML PO SOLN: 325.0000 mg | ORAL | Status: DC | PRN

## 2018-09-05 MED ORDER — TRAMADOL HCL 50 MG PO TABS: ORAL_TABLET | ORAL | 0 refills | Status: DC

## 2018-09-05 MED ORDER — LACTATED RINGERS IV SOLN
INTRAVENOUS | Status: DC
  Administered 2018-09-05: 08:00:00 via INTRAVENOUS

## 2018-09-05 MED ORDER — ALFENTANIL 500 MCG/ML IJ INJ
INJECTION | INTRAVENOUS | Status: DC | PRN
  Administered 2018-09-05: 400 ug via INTRAVENOUS
  Administered 2018-09-05: 200 ug via INTRAVENOUS

## 2018-09-05 MED ORDER — ACETAMINOPHEN 325 MG PO TABS: 650.0000 mg | ORAL_TABLET | Freq: Once | ORAL | Status: DC | PRN

## 2018-09-05 MED ORDER — TETRACAINE HCL 0.5 % OP SOLN
OPHTHALMIC | Status: DC | PRN
  Administered 2018-09-05: 2 [drp] via OPHTHALMIC

## 2018-09-05 MED ORDER — DIPHENHYDRAMINE HCL 50 MG/ML IJ SOLN
INTRAMUSCULAR | Status: DC | PRN
  Administered 2018-09-05: 12.5 mg via INTRAVENOUS

## 2018-09-05 MED ORDER — LIDOCAINE HCL (CARDIAC) PF 100 MG/5ML IV SOSY: PREFILLED_SYRINGE | INTRAVENOUS | Status: DC | PRN

## 2018-09-05 MED ORDER — BSS IO SOLN
INTRAOCULAR | Status: DC | PRN
  Administered 2018-09-05: 15 mL

## 2018-09-05 SURGICAL SUPPLY — 23 items
APPLICATOR COTTON TIP WD 3 STR (MISCELLANEOUS) ×4 IMPLANT
BLADE SURG 15 STRL LF DISP TIS (BLADE) ×1 IMPLANT
BLADE SURG 15 STRL SS (BLADE) ×1
CORD BIP STRL DISP 12FT (MISCELLANEOUS) ×2 IMPLANT
DRAPE HEAD BAR (DRAPES) ×2 IMPLANT
GAUZE SPONGE 4X4 12PLY STRL (GAUZE/BANDAGES/DRESSINGS) ×2 IMPLANT
GAUZE SPONGE NON-WVN 2X2 STRL (MISCELLANEOUS) ×10 IMPLANT
GLOVE SURG LX 7.0 MICRO (GLOVE) ×2
GLOVE SURG LX STRL 7.0 MICRO (GLOVE) ×2 IMPLANT
MARKER SKIN XFINE TIP W/RULER (MISCELLANEOUS) ×2 IMPLANT
NEEDLE FILTER BLUNT 18X 1/2SAF (NEEDLE) ×1
NEEDLE FILTER BLUNT 18X1 1/2 (NEEDLE) ×1 IMPLANT
NEEDLE HYPO 30X.5 LL (NEEDLE) ×4 IMPLANT
PACK ENT CUSTOM (PACKS) ×2 IMPLANT
SOL PREP PVP 2OZ (MISCELLANEOUS) ×2
SOLUTION PREP PVP 2OZ (MISCELLANEOUS) ×1 IMPLANT
SPONGE VERSALON 2X2 STRL (MISCELLANEOUS) ×10
SUT PLAIN GUT (SUTURE) ×2 IMPLANT
SUT PROLENE 5 0 P 3 (SUTURE) ×2 IMPLANT
SUT PROLENE 6 0 P 1 18 (SUTURE) ×4 IMPLANT
SYR 10ML LL (SYRINGE) ×2 IMPLANT
SYR 3ML LL SCALE MARK (SYRINGE) ×2 IMPLANT
WATER STERILE IRR 250ML POUR (IV SOLUTION) ×2 IMPLANT

## 2018-09-05 NOTE — H&P (Signed)
See the history and physical completed at [facility] on 08/22/18 and scanned into the chart.

## 2018-09-05 NOTE — Transfer of Care (Signed)
Immediate Anesthesia Transfer of Care Note  Patient: Judy Bolton  Procedure(s) Performed: BLEPHAROPLASTY UPPER EYELID W/EXCESS SKIN (Bilateral Eye) PTOSIS REPAIR RESECT EX (Bilateral Eye)  Patient Location: PACU  Anesthesia Type: MAC  Level of Consciousness: awake, alert  and patient cooperative  Airway and Oxygen Therapy: Patient Spontanous Breathing and Patient connected to supplemental oxygen  Post-op Assessment: Post-op Vital signs reviewed, Patient's Cardiovascular Status Stable, Respiratory Function Stable, Patent Airway and No signs of Nausea or vomiting  Post-op Vital Signs: Reviewed and stable  Complications: No apparent anesthesia complications

## 2018-09-05 NOTE — Op Note (Signed)
Preoperative Diagnosis:  1. Visually significant blepharoptosis bilateral upper Eyelid(s) 2. Visually significant dermatochalasis bilateral upper Eyelid(s)  Postoperative Diagnosis:  Same.  Procedure(s) Performed:   1. Blepharoptosis repair with levator aponeurosis advancement bilateral upper Eyelid(s) 2. Upper eyelid blepharoplasty with excess skin excision bilateral upper Eyelid(s)  Surgeon: Philis Pique. Vickki Muff, M.D.  Assistants: none  Anesthesia: MAC  Specimens: None.  Estimated Blood Loss: Minimal.  Complications: None.  Operative Findings: None Dictated  Procedure:   Allergies were reviewed and the patient Betadine [povidone iodine]; Codeine; Latex; Meloxicam; and Penicillins.  The allergy to Betadine is limited to swelling only.  We decided to prep with this since she did not have a history of rash or hives and we pretreated with IV Benadryl.  After the risks, benefits, complications and alternatives were discussed with the patient, appropriate informed consent was obtained.  While seated in an upright position and looking in primary gaze, the mid pupillary line was marked on the upper eyelid margins bilaterally. The patient was then brought to the operating suite and reclined supine.  Timeout was conducted and the patient was sedated.  Local anesthetic consisting of a 50-50 mixture of 2% lidocaine with epinephrine and 0.75% bupivacaine with added Hylenex was injected subcutaneously to both upper eyelid(s). After adequate local was instilled, the patient was prepped and draped in the usual sterile fashion for eyelid surgery.   Attention was turned to the upper eyelids. A 27m upper eyelid crease incision line was marked with calipers on both upper eyelid(s).  A pinch test was used to estimate the amount of excess skin to remove and this was marked in standard blepharoplasty style fashion. Attention was turned to the right upper eyelid. A #15 blade was used to open the premarked  incision line. A skin and muscle flap was excised and hemostasis was obtained with bipolar cautery.   A buttonhole was created medially in orbicularis and orbital septum to reveal the medial fat pocket. This was dissected free from fascial attachments, cauterized towards the pedicle base and excised to produce a nice flattening of the medial corner of the upper eyelid.   Westcott scissors were then used to transect through orbicularis down to the tarsal plate. Epitarsus was dissected to create a smooth surface to suture to. Dissection was then carried superiorly in the plane between orbicularis and orbital septum. Once the preaponeurotic fat pocket was identified, the orbital septum was opened. This revealed the levator and its aponeurosis.    Attention was then turned to the opposite eyelid where the same procedure was performed in the same manner. Hemostasis was obtained with bipolar cautery throughout.   3 interrupted 6-0 Prolene sutures were then passed partial thickness through the tarsal plates of both upper eyelid(s). These sutures were placed in line with the mid pupillary, medial limbal, and lateral limbal lines. The sutures were fixed to the levator aponeurosis and adjusted until a nice lid height and contour were achieved. Once nice symmetry was achieved, the skin incisions were closed with a running 6-0 fast absorbing plain suture. The patient tolerated the procedure well.  Erythromycin ophthalmic ointment was applied to the incision site(s) followed by ice packs. The patient was taken to the recovery area where she recovered without difficulty.  Post-Op Plan/Instructions:  The patient was instructed to use ice packs frequently for the next 48 hours. She was instructed to use erythromycin ophthalmic ointment on her incisions 4 times a day for the next 12 to 14 days. She was given a  prescription for tramadol for pain control should Tylenol not be effective. She was asked to to follow up at  the [facility] in [place], [place] in 2 weeks' time or sooner as needed for problems.  Basir Niven M. Vickki Muff, M.D. Attending,Ophthalmology

## 2018-09-05 NOTE — Interval H&P Note (Signed)
History and Physical Interval Note:  09/05/2018 8:13 AM  Judy Bolton  has presented today for surgery, with the diagnosis of H02.831 H02.834 DERMATOCHALASIS H02.403 PTOSIS OF EYELID UNSPECIFIED  The various methods of treatment have been discussed with the patient and family. After consideration of risks, benefits and other options for treatment, the patient has consented to  Procedure(s) with comments: BLEPHAROPLASTY UPPER EYELID W/EXCESS SKIN (Bilateral) PTOSIS REPAIR RESECT EX (Bilateral) - Diabetic - oral meds sleep apnea Latexd allergy as a surgical intervention .  The patient's history has been reviewed, patient examined, no change in status, stable for surgery.  I have reviewed the patient's chart and labs.  Questions were answered to the patient's satisfaction.     Ether Griffins, Amy M

## 2018-09-05 NOTE — Anesthesia Preprocedure Evaluation (Signed)
Anesthesia Evaluation  Patient identified by MRN, date of birth, ID band Patient awake    Reviewed: Allergy & Precautions, NPO status , Patient's Chart, lab work & pertinent test results  History of Anesthesia Complications Negative for: history of anesthetic complications  Airway Mallampati: III  TM Distance: >3 FB Neck ROM: Full    Dental  (+)    Pulmonary asthma , sleep apnea , COPD, former smoker (quit 2001),    Pulmonary exam normal breath sounds clear to auscultation       Cardiovascular hypertension, + Peripheral Vascular Disease  Normal cardiovascular exam Rhythm:Regular Rate:Normal     Neuro/Psych Seizures - (x1, not on medication),  PSYCHIATRIC DISORDERS Anxiety CVA (2017, no deficits)    GI/Hepatic GERD  ,  Endo/Other  diabetes  Renal/GU      Musculoskeletal  (+) Arthritis ,   Abdominal   Peds  Hematology negative hematology ROS (+)   Anesthesia Other Findings   Reproductive/Obstetrics                             Anesthesia Physical Anesthesia Plan  ASA: III  Anesthesia Plan: MAC   Post-op Pain Management:    Induction: Intravenous  PONV Risk Score and Plan: 2 and TIVA and Midazolam  Airway Management Planned: Natural Airway  Additional Equipment:   Intra-op Plan:   Post-operative Plan:   Informed Consent: I have reviewed the patients History and Physical, chart, labs and discussed the procedure including the risks, benefits and alternatives for the proposed anesthesia with the patient or authorized representative who has indicated his/her understanding and acceptance.     Plan Discussed with: CRNA  Anesthesia Plan Comments:         Anesthesia Quick Evaluation

## 2018-09-05 NOTE — Anesthesia Procedure Notes (Addendum)
Date/Time: 09/05/2018 8:23 AM Performed by: Maree Krabbe, CRNA Pre-anesthesia Checklist: Patient identified, Emergency Drugs available, Suction available, Timeout performed and Patient being monitored Patient Re-evaluated:Patient Re-evaluated prior to induction Oxygen Delivery Method: Nasal cannula Placement Confirmation: positive ETCO2

## 2018-09-05 NOTE — Anesthesia Postprocedure Evaluation (Signed)
Anesthesia Post Note  Patient: Judy Bolton  Procedure(s) Performed: BLEPHAROPLASTY UPPER EYELID W/EXCESS SKIN (Bilateral Eye) PTOSIS REPAIR RESECT EX (Bilateral Eye)  Patient location during evaluation: PACU Anesthesia Type: MAC Level of consciousness: awake and alert, oriented and patient cooperative Pain management: pain level controlled Vital Signs Assessment: post-procedure vital signs reviewed and stable Respiratory status: spontaneous breathing, nonlabored ventilation and respiratory function stable Cardiovascular status: blood pressure returned to baseline and stable Postop Assessment: adequate PO intake Anesthetic complications: no    Darrin Nipper

## 2018-09-06 ENCOUNTER — Encounter: Payer: Self-pay | Admitting: Ophthalmology

## 2018-09-09 ENCOUNTER — Other Ambulatory Visit: Payer: Self-pay | Admitting: Family Medicine

## 2018-09-09 DIAGNOSIS — E785 Hyperlipidemia, unspecified: Secondary | ICD-10-CM

## 2018-09-19 DIAGNOSIS — R69 Illness, unspecified: Secondary | ICD-10-CM | POA: Diagnosis not present

## 2018-10-08 ENCOUNTER — Other Ambulatory Visit: Payer: Self-pay | Admitting: Family Medicine

## 2018-10-16 DIAGNOSIS — R69 Illness, unspecified: Secondary | ICD-10-CM | POA: Diagnosis not present

## 2018-10-18 DIAGNOSIS — R69 Illness, unspecified: Secondary | ICD-10-CM | POA: Diagnosis not present

## 2018-10-18 DIAGNOSIS — J4520 Mild intermittent asthma, uncomplicated: Secondary | ICD-10-CM | POA: Diagnosis not present

## 2018-10-26 ENCOUNTER — Ambulatory Visit: Payer: Medicare HMO | Admitting: Family Medicine

## 2018-11-05 ENCOUNTER — Other Ambulatory Visit: Payer: Self-pay | Admitting: Family Medicine

## 2018-11-17 ENCOUNTER — Other Ambulatory Visit: Payer: Self-pay | Admitting: Family Medicine

## 2018-11-17 ENCOUNTER — Ambulatory Visit (INDEPENDENT_AMBULATORY_CARE_PROVIDER_SITE_OTHER): Payer: Medicare HMO | Admitting: Family Medicine

## 2018-11-17 ENCOUNTER — Encounter: Payer: Self-pay | Admitting: Family Medicine

## 2018-11-17 VITALS — BP 148/78 | HR 72 | Ht 62.0 in | Wt 138.0 lb

## 2018-11-17 DIAGNOSIS — E119 Type 2 diabetes mellitus without complications: Secondary | ICD-10-CM

## 2018-11-17 DIAGNOSIS — E785 Hyperlipidemia, unspecified: Secondary | ICD-10-CM

## 2018-11-17 DIAGNOSIS — N342 Other urethritis: Secondary | ICD-10-CM

## 2018-11-17 LAB — POCT URINALYSIS DIPSTICK
BILIRUBIN UA: POSITIVE
Blood, UA: NEGATIVE
GLUCOSE UA: NEGATIVE
KETONES UA: NEGATIVE
Nitrite, UA: NEGATIVE
Protein, UA: NEGATIVE
Spec Grav, UA: 1.01 (ref 1.010–1.025)
Urobilinogen, UA: 0.2 E.U./dL
pH, UA: 6 (ref 5.0–8.0)

## 2018-11-17 MED ORDER — FLUCONAZOLE 150 MG PO TABS: 150.0000 mg | ORAL_TABLET | Freq: Once | ORAL | 0 refills | Status: AC

## 2018-11-17 MED ORDER — NITROFURANTOIN MONOHYD MACRO 100 MG PO CAPS: 100.0000 mg | ORAL_CAPSULE | Freq: Two times a day (BID) | ORAL | 0 refills | Status: DC

## 2018-11-17 NOTE — Progress Notes (Signed)
Date:  11/17/2018   Name:  Judy Bolton   DOB:  July 27, 1946   MRN:  [id number]   Chief Complaint: Dizziness (pt got her metformin and lopid confused, she has started taking the medicine right again, but sugar is still running about 200) and Urinary Tract Infection (is currently taking a long dose of Zpack due to cough from [employer] (eye))  Diabetes  She presents for her follow-up diabetic visit. She has type 2 diabetes mellitus. Her disease course has been stable. Pertinent negatives for hypoglycemia include no confusion, dizziness, headaches, hunger, mood changes, nervousness/anxiousness, pallor, seizures, sleepiness, speech difficulty, sweats or tremors. Pertinent negatives for diabetes include no blurred vision, no chest pain, no fatigue, no foot paresthesias, no foot ulcerations, no polydipsia, no polyphagia, no polyuria, no visual change, no weakness and no weight loss. Symptoms are stable. There are no diabetic complications. There are no known risk factors for coronary artery disease. Current diabetic treatment includes oral agent (monotherapy). She is compliant with treatment most of the time. Her weight is stable. She is following a generally healthy diet. She participates in exercise intermittently. Her home blood glucose trend is decreasing rapidly. Her breakfast blood glucose is taken between 8-9 am. Her breakfast blood glucose range is generally 180-200 mg/dl.    Review of Systems  Constitutional: Negative.  Negative for chills, fatigue, fever, unexpected weight change and weight loss.  HENT: Negative for congestion, ear discharge, ear pain, rhinorrhea, sinus pressure, sneezing and sore throat.   Eyes: Negative for blurred vision, photophobia, pain, discharge, redness and itching.  Respiratory: Negative for cough, shortness of breath, wheezing and stridor.   Cardiovascular: Negative for chest pain.  Gastrointestinal: Negative for abdominal pain, blood in stool, constipation,  diarrhea, nausea and vomiting.  Endocrine: Negative for cold intolerance, heat intolerance, polydipsia, polyphagia and polyuria.  Genitourinary: Negative for dysuria, flank pain, frequency, hematuria, menstrual problem, pelvic pain, urgency, vaginal bleeding and vaginal discharge.  Musculoskeletal: Negative for arthralgias, back pain and myalgias.  Skin: Negative for pallor and rash.  Allergic/Immunologic: Negative for environmental allergies and food allergies.  Neurological: Negative for dizziness, tremors, seizures, speech difficulty, weakness, light-headedness, numbness and headaches.  Hematological: Negative for adenopathy. Does not bruise/bleed easily.  Psychiatric/Behavioral: Negative for confusion and dysphoric mood. The patient is not nervous/anxious.     Patient Active Problem List   Diagnosis Date Noted  . Essential hypertension 08/30/2017  . Hyperlipidemia 08/30/2017  . Gastroesophageal reflux disease 08/30/2017  . Lumbar stenosis with neurogenic claudication 07/27/2017  . Aortic atherosclerosis (HCC) 05/31/2017  . PAD (peripheral artery disease) (HCC) 05/31/2017  . Type 2 diabetes mellitus without complication, without long-term current use of insulin (HCC) 03/03/2017  . Acute anxiety 04/16/2016  . Bronchitis 04/16/2016  . Centrilobular emphysema (HCC) 04/16/2016  . TIA (transient ischemic attack) 02/29/2016    Allergies  Allergen Reactions  . Betadine [Povidone Iodine] Swelling  . Codeine Itching  . Latex Itching and Swelling    (urinary catheter)  . Meloxicam Swelling  . Penicillins Other (See Comments)    Paralysis  Has patient had a PCN reaction causing immediate rash, facial/tongue/throat swelling, SOB or lightheadedness with hypotension: No Has patient had a PCN reaction causing severe rash involving mucus membranes or skin necrosis: No Has patient had a PCN reaction that required hospitalization Yes Has patient had a PCN reaction occurring within the last 10  years: No If all of the above answers are "NO", then may proceed with Cephalosporin use.  Past Surgical History:  Procedure Laterality Date  . ABDOMINAL HYSTERECTOMY    . ANTERIOR (CYSTOCELE) AND POSTERIOR REPAIR (RECTOCELE) WITH XENFORM GRAFT AND SACROSPINOUS FIXATION    . APPENDECTOMY    . BROW LIFT Bilateral 09/05/2018   Procedure: BLEPHAROPLASTY UPPER EYELID W/EXCESS SKIN;  Surgeon: Imagene Riches, MD;  Location: [facility];  Service: Ophthalmology;  Laterality: Bilateral;  . CTR Bilateral   . LUMBAR LAMINECTOMY/DECOMPRESSION MICRODISCECTOMY N/A 07/27/2017   Procedure: LUMBAR LAMINECTOMY/DECOMPRESSION MICRODISCECTOMY 3 LEVELS-L3-S1;  Surgeon: Venetia Night, MD;  Location: [facility];  Service: Neurosurgery;  Laterality: N/A;  . PTOSIS REPAIR Bilateral 09/05/2018   Procedure: PTOSIS REPAIR RESECT EX;  Surgeon: Imagene Riches, MD;  Location: [facility];  Service: Ophthalmology;  Laterality: Bilateral;  Diabetic - oral meds sleep apnea Latexd allergy  . ROTATOR CUFF REPAIR Right     Social History   Tobacco Use  . Smoking status: Former Smoker    Packs/day: 1.50    Years: 28.00    Pack years: 42.00    Types: Cigarettes    Last attempt to quit: 08/14/2000    Years since quitting: 18.2  . Smokeless tobacco: Never Used  . Tobacco comment: smoking cessatiuon materials not required  Substance Use Topics  . Alcohol use: No  . Drug use: No     Medication list has been reviewed and updated.  Current Meds  Medication Sig  . albuterol (PROVENTIL HFA;VENTOLIN HFA) 108 (90 Base) MCG/ACT inhaler Inhale 2 puffs into the lungs every 6 (six) hours as needed for wheezing or shortness of breath.  Marland Kitchen albuterol (PROVENTIL) (2.5 MG/3ML) 0.083% nebulizer solution Take 3 mLs (2.5 mg total) by nebulization every 6 (six) hours as needed for wheezing or shortness of breath.  . ALPRAZolam (XANAX) 0.25 MG tablet Take 1 tablet (0.25 mg total) by mouth once as needed for up to 1 dose  for anxiety.  Marland Kitchen atorvastatin (LIPITOR) 40 MG tablet TAKE 1 TABLET BY MOUTH EVERY DAY  . azithromycin (ZITHROMAX) 250 MG tablet   . budesonide-formoterol (SYMBICORT) 160-4.5 MCG/ACT inhaler Inhale 2 puffs into the lungs 2 (two) times daily.  Marland Kitchen etodolac (LODINE XL) 500 MG 24 hr tablet TAKE 1 TABLET (500 MG TOTAL) DAILY BY MOUTH.  Marland Kitchen gemfibrozil (LOPID) 600 MG tablet TAKE 1 TABLET BY MOUTH TWICE A DAY  . hydrochlorothiazide (HYDRODIURIL) 25 MG tablet Take 1 tablet (25 mg total) by mouth daily.  . metFORMIN (GLUCOPHAGE) 1000 MG tablet Take 1,000 mg by mouth 2 (two) times daily with a meal.  . metoprolol tartrate (LOPRESSOR) 25 MG tablet Take 1 tablet (25 mg total) by mouth 2 (two) times daily.  . montelukast (SINGULAIR) 10 MG tablet Take 1 tablet by mouth daily.  Marland Kitchen omeprazole (PRILOSEC) 20 MG capsule Take 20 mg by mouth daily.  . predniSONE (DELTASONE) 10 MG tablet Take 1 tablet (10 mg total) by mouth daily with breakfast.    PHQ 2/9 Scores 05/09/2018 01/23/2018 01/23/2018 06/23/2017  PHQ - 2 Score 0 0 0 0  PHQ- 9 Score 0 0 - -    Physical Exam Constitutional:      General: She is not in acute distress.    Appearance: She is not diaphoretic.  HENT:     Head: Normocephalic and atraumatic.     Right Ear: Tympanic membrane and external ear normal.     Left Ear: Tympanic membrane and external ear normal.     Nose: Nose normal. No congestion or rhinorrhea.  Eyes:  General:        Right eye: No discharge.        Left eye: No discharge.     Conjunctiva/sclera: Conjunctivae normal.     Pupils: Pupils are equal, round, and reactive to light.  Neck:     Musculoskeletal: Normal range of motion and neck supple.     Thyroid: No thyromegaly.     Vascular: No JVD.  Cardiovascular:     Rate and Rhythm: Normal rate and regular rhythm.     Heart sounds: Normal heart sounds. No murmur. No friction rub. No gallop.   Pulmonary:     Effort: Pulmonary effort is normal.     Breath sounds: Normal breath  sounds. No wheezing, rhonchi or rales.  Chest:     Chest wall: No tenderness.  Abdominal:     General: Bowel sounds are normal.     Palpations: Abdomen is soft. There is no mass.     Tenderness: There is no abdominal tenderness. There is no guarding.  Musculoskeletal: Normal range of motion.  Lymphadenopathy:     Cervical: No cervical adenopathy.  Skin:    General: Skin is warm and dry.  Neurological:     Mental Status: She is alert.     Deep Tendon Reflexes: Reflexes are normal and symmetric.     BP (!) 148/78   Pulse 72   Ht 5\' 2"  (1.575 m)   Wt 138 lb (62.6 kg)   BMI 25.24 kg/m   Assessment and Plan: 1. Type 2 diabetes mellitus without complication, without long-term current use of insulin (HCC) Patient had mixed medicine up- was taking lopid in the place of metformin for approx 3 weeks. Blood sugar had reached as high as 500. She has been taking the correct dose of metformin now for approx one to two weeks. Continue present dose on medsication  2. Urethritis Checked urine- start diflucan and macrobid bid for 3 days - fluconazole (DIFLUCAN) 150 MG tablet; Take 1 tablet (150 mg total) by mouth once for 1 dose.  Dispense: 1 tablet; Refill: 0 - POCT urinalysis dipstick

## 2018-11-20 DIAGNOSIS — R69 Illness, unspecified: Secondary | ICD-10-CM | POA: Diagnosis not present

## 2018-11-28 ENCOUNTER — Other Ambulatory Visit: Payer: Self-pay | Admitting: Family Medicine

## 2018-12-12 ENCOUNTER — Other Ambulatory Visit: Payer: Self-pay | Admitting: Family Medicine

## 2018-12-13 ENCOUNTER — Other Ambulatory Visit: Payer: Self-pay | Admitting: Family Medicine

## 2018-12-13 DIAGNOSIS — I10 Essential (primary) hypertension: Secondary | ICD-10-CM

## 2018-12-15 DIAGNOSIS — E119 Type 2 diabetes mellitus without complications: Secondary | ICD-10-CM | POA: Diagnosis not present

## 2018-12-15 DIAGNOSIS — E538 Deficiency of other specified B group vitamins: Secondary | ICD-10-CM | POA: Diagnosis not present

## 2018-12-15 DIAGNOSIS — E1129 Type 2 diabetes mellitus with other diabetic kidney complication: Secondary | ICD-10-CM | POA: Diagnosis not present

## 2018-12-15 DIAGNOSIS — R809 Proteinuria, unspecified: Secondary | ICD-10-CM | POA: Diagnosis not present

## 2018-12-15 DIAGNOSIS — I10 Essential (primary) hypertension: Secondary | ICD-10-CM | POA: Diagnosis not present

## 2018-12-15 DIAGNOSIS — Z794 Long term (current) use of insulin: Secondary | ICD-10-CM | POA: Diagnosis not present

## 2018-12-15 DIAGNOSIS — E785 Hyperlipidemia, unspecified: Secondary | ICD-10-CM | POA: Diagnosis not present

## 2018-12-19 DIAGNOSIS — R69 Illness, unspecified: Secondary | ICD-10-CM | POA: Diagnosis not present

## 2018-12-20 DIAGNOSIS — R0602 Shortness of breath: Secondary | ICD-10-CM | POA: Diagnosis not present

## 2018-12-27 LAB — HM DIABETES EYE EXAM

## 2018-12-28 ENCOUNTER — Other Ambulatory Visit: Payer: Self-pay | Admitting: Family Medicine

## 2018-12-28 DIAGNOSIS — I10 Essential (primary) hypertension: Secondary | ICD-10-CM

## 2019-01-03 ENCOUNTER — Other Ambulatory Visit: Payer: Self-pay | Admitting: Family Medicine

## 2019-01-04 ENCOUNTER — Other Ambulatory Visit: Payer: Self-pay | Admitting: Family Medicine

## 2019-01-24 ENCOUNTER — Ambulatory Visit: Payer: Medicare HMO

## 2019-01-31 ENCOUNTER — Ambulatory Visit (INDEPENDENT_AMBULATORY_CARE_PROVIDER_SITE_OTHER): Payer: Medicare HMO

## 2019-01-31 VITALS — BP 132/90 | HR 85 | Temp 98.1°F | Resp 17 | Ht 62.0 in | Wt 144.2 lb

## 2019-01-31 DIAGNOSIS — Z1231 Encounter for screening mammogram for malignant neoplasm of breast: Secondary | ICD-10-CM

## 2019-01-31 DIAGNOSIS — Z780 Asymptomatic menopausal state: Secondary | ICD-10-CM

## 2019-01-31 DIAGNOSIS — Z0000 Encounter for general adult medical examination without abnormal findings: Secondary | ICD-10-CM | POA: Diagnosis not present

## 2019-01-31 NOTE — Progress Notes (Signed)
Subjective:   Judy Bolton is a 73 y.o. female who presents for Medicare Annual (Subsequent) preventive examination.  Review of Systems:   Cardiac Risk Factors include: advanced age (>50men, >63 women);diabetes mellitus;dyslipidemia;hypertension     Objective:     Vitals: BP 132/90 (BP Location: Left Arm, Patient Position: Sitting, Cuff Size: Normal)   Pulse 85   Temp 98.1 F (36.7 C) (Oral)   Resp 17   Ht  (1.575 m)   Wt 144 lb 3.2 oz (65.4 kg)   SpO2 95%   BMI 26.37 kg/m   Body mass index is 26.37 kg/m.  Advanced Directives 01/31/2019 09/05/2018 01/23/2018 01/07/2018 07/27/2017 07/27/2017 07/21/2017  Does Patient Have a Medical Advance Directive? Yes No Yes Yes Yes Yes Yes  Type of Advance Directive Living will;Healthcare Power of Attorney - Living will Living will Healthcare Power of [place];Living will Healthcare Power of [place];Living will Healthcare Power of [place];Living will  Does patient want to make changes to medical advance directive? Yes (MAU/Ambulatory/Procedural Areas - Information given) - Yes (MAU/Ambulatory/Procedural Areas - Information given) - No - Patient declined No - Patient declined -  Copy of Healthcare Power of Attorney in Chart? No - copy requested - - - No - copy requested Yes -  Would patient like information on creating a medical advance directive? - No - Patient declined - No - Patient declined - - -    Tobacco Social History   Tobacco Use  Smoking Status Former Smoker  . Packs/day: 1.50  . Years: 28.00  . Pack years: 42.00  . Types: Cigarettes  . Last attempt to quit: 08/14/2000  . Years since quitting: 18.4  Smokeless Tobacco Never Used  Tobacco Comment   smoking cessatiuon materials not required     Counseling given: Not Answered Comment: smoking cessatiuon materials not required   Clinical Intake:  Pre-visit preparation completed: Yes  Pain : No/denies pain     BMI - recorded: 26.37 Nutritional Status: BMI 25 -29  Overweight Nutritional Risks: None Diabetes: Yes CBG done?: No Did pt. bring in CBG monitor from home?: No   Nutrition Risk Assessment:  Has the patient had any N/V/D within the last 2 months?  No  Does the patient have any non-healing wounds?  No  Has the patient had any unintentional weight loss or weight gain?  No   Diabetes:  Is the patient diabetic?  Yes  If diabetic, was a CBG obtained today?  No  Did the patient bring in their glucometer from home?  No  How often do you monitor your CBG's? Daily fasting in morning, has noticed a decrease since being on trial medication. .   Financial Strains and Diabetes Management:  Are you having any financial strains with the device, your supplies or your medication? No .  Does the patient want to be seen by Chronic Care Management for management of their diabetes?  No  Would the patient like to be referred to a Nutritionist or for Diabetic Management?  No   Diabetic Exams:  Diabetic Eye Exam: Completed 08/22/18.   Diabetic Foot Exam: Completed 12/29/17. Pt has been advised about the importance in completing this exam. Being seen by endocrinology.   How often do you need to have someone help you when you read instructions, pamphlets, or other written materials from your doctor or pharmacy?: 1 - Never What is the last grade level you completed in school?: 12th grade  Interpreter Needed?: No  Information entered by ::  Reather LittlerKasey Shaquayla Klimas LPN  Past Medical History:  Diagnosis Date  . Acid reflux   . Arthritis   . Asthma   . Back pain   . Back pain   . COPD (chronic obstructive pulmonary disease) (HCC)   . Diabetes mellitus without complication (HCC)   . Hyperlipidemia   . Hypertension   . Seizure (HCC)    x1 - over 30 yrs ago  . Sleep apnea    No CPAP  . Stroke [facility](HCC)    March of 2017 vocabulary   Past Surgical History:  Procedure Laterality Date  . ABDOMINAL HYSTERECTOMY    . ANTERIOR (CYSTOCELE) AND POSTERIOR REPAIR  (RECTOCELE) WITH XENFORM GRAFT AND SACROSPINOUS FIXATION    . APPENDECTOMY    . BROW LIFT Bilateral 09/05/2018   Procedure: BLEPHAROPLASTY UPPER EYELID W/EXCESS SKIN;  Surgeon: Imagene RichesFowler, Amy M, MD;  [facility];  Service: Ophthalmology;  Laterality: Bilateral;  . CTR Bilateral   . LUMBAR LAMINECTOMY/DECOMPRESSION MICRODISCECTOMY N/A 07/27/2017   Procedure: LUMBAR LAMINECTOMY/DECOMPRESSION MICRODISCECTOMY 3 LEVELS-L3-S1;  Surgeon: Venetia NightYarbrough, Chester, MD;  Location: [facility];  Service: Neurosurgery;  Laterality: N/A;  . PTOSIS REPAIR Bilateral 09/05/2018   Procedure: PTOSIS REPAIR RESECT EX;  Surgeon: Imagene RichesFowler, Amy M, MD;  [facility];  Service: Ophthalmology;  Laterality: Bilateral;  Diabetic - oral meds sleep apnea Latexd allergy  . ROTATOR CUFF REPAIR Right    Family History  Problem Relation Age of Onset  . Heart attack Mother   . Prostate cancer Father   . Brain cancer Father   . Heart attack Maternal Grandmother   . Heart attack Maternal Grandfather   . Breast cancer Paternal Grandmother    Social History   Socioeconomic History  . Marital status: Widowed    Spouse name: Not on file  . Number of children: 1  . Years of education: some college  . Highest education level: 12th grade  Occupational History  . Occupation: Retired  Engineer, productionocial Needs  . Financial resource strain: Not hard at all  . Food insecurity:    Worry: Never true    Inability: Never true  . Transportation needs:    Medical: No    Non-medical: No  Tobacco Use  . Smoking status: Former Smoker    Packs/day: 1.50    Years: 28.00    Pack years: 42.00    Types: Cigarettes    Last attempt to quit: 08/14/2000    Years since quitting: 18.4  . Smokeless tobacco: Never Used  . Tobacco comment: smoking cessatiuon materials not required  Substance and Sexual Activity  . Alcohol use: No  . Drug use: No  . Sexual activity: Not Currently  Lifestyle  . Physical activity:    Days per  week: 0 days    Minutes per session: 0 min  . Stress: Not at all  Relationships  . Social connections:    Talks on phone: More than three times a week    Gets together: More than three times a week    Attends religious service: More than 4 times per year    Active member of club or organization: Yes    Attends meetings of clubs or organizations: More than 4 times per year    Relationship status: Widowed  Other Topics Concern  . Not on file  Social History Narrative  . Not on file    Outpatient Encounter Medications as of 01/31/2019  Medication Sig  . albuterol (PROVENTIL HFA;VENTOLIN HFA) 108 (90 Base)  MCG/ACT inhaler Inhale 2 puffs into the lungs every 6 (six) hours as needed for wheezing or shortness of breath.  Marland Kitchen albuterol (PROVENTIL) (2.5 MG/3ML) 0.083% nebulizer solution Take 3 mLs (2.5 mg total) by nebulization every 6 (six) hours as needed for wheezing or shortness of breath.  Marland Kitchen aspirin EC 81 MG tablet Take by mouth.  Marland Kitchen atorvastatin (LIPITOR) 40 MG tablet TAKE 1 TABLET BY MOUTH EVERY DAY  . etodolac (LODINE XL) 500 MG 24 hr tablet TAKE 1 TABLET (500 MG TOTAL) DAILY BY MOUTH. (Patient taking differently: Take 500 mg by mouth daily. PRN)  . gemfibrozil (LOPID) 600 MG tablet TAKE 1 TABLET BY MOUTH TWICE A DAY  . hydrochlorothiazide (HYDRODIURIL) 25 MG tablet TAKE 1 TABLET BY MOUTH EVERY DAY  . ipratropium (ATROVENT) 0.02 % nebulizer solution Inhale 2.5 mLs into the lungs 4 (four) times daily.  . metFORMIN (GLUCOPHAGE) 1000 MG tablet TAKE 1 TABLET BY MOUTH TWICE A DAY  . metoprolol tartrate (LOPRESSOR) 25 MG tablet TAKE 1 TABLET BY MOUTH TWICE A DAY  . montelukast (SINGULAIR) 10 MG tablet Take 1 tablet by mouth daily.  Marland Kitchen omeprazole (PRILOSEC) 20 MG capsule Take 20 mg by mouth daily.  . predniSONE (DELTASONE) 10 MG tablet Take 1 tablet (10 mg total) by mouth daily with breakfast.  . budesonide-formoterol (SYMBICORT) 160-4.5 MCG/ACT inhaler Inhale 2 puffs into the lungs 2 (two) times  daily. (Patient not taking: Reported on 01/31/2019)  . [DISCONTINUED] ALPRAZolam (XANAX) 0.25 MG tablet Take 1 tablet (0.25 mg total) by mouth once as needed for up to 1 dose for anxiety.  . [DISCONTINUED] azithromycin (ZITHROMAX) 250 MG tablet   . [DISCONTINUED] liraglutide (VICTOZA) 18 MG/3ML SOPN Inject 18 mg into the skin daily as needed (FOR ELEVATED BLOOD SUGARS).   . [DISCONTINUED] nitrofurantoin, macrocrystal-monohydrate, (MACROBID) 100 MG capsule Take 1 capsule (100 mg total) by mouth 2 (two) times daily.   No facility-administered encounter medications on file as of 01/31/2019.     Activities of Daily Living In your present state of health, do you have any difficulty performing the following activities: 01/31/2019 09/05/2018  Hearing? N N  Comment declines hearing aids -  Vision? N N  Comment wears glasses -  Difficulty concentrating or making decisions? N N  Walking or climbing stairs? N N  Dressing or bathing? N N  Doing errands, shopping? N -  Preparing Food and eating ? N -  Using the Toilet? N -  In the past six months, have you accidently leaked urine? N -  Do you have problems with loss of bowel control? N -  Managing your Medications? N -  Managing your Finances? N -  Housekeeping or managing your Housekeeping? N -  Some recent data might be hidden    Patient Care Team: Duanne Limerick, MD as PCP - General (Family Medicine) Mertie Moores, MD as Consulting Physician (Specialist)    Assessment:   This is a routine wellness examination for Judy Bolton.  Exercise Activities and Dietary recommendations Current Exercise Habits: The patient does not participate in regular exercise at present, Exercise limited by: respiratory conditions(s);orthopedic condition(s)  Goals    . DIET - INCREASE WATER INTAKE     Recommend to drink at least 6-8 8oz glasses of water per day.    . Patient Stated     Pt states she would like to maintain her health and lower A1C.        Fall Risk Fall Risk  01/31/2019 01/23/2018  06/23/2017 04/16/2016 01/23/2016  Falls in the past year? 1 No No No No  Number falls in past yr: 1 - - - -  Comment tripped over nebulizer cord - - - -  Injury with Fall? 0 - - - -  Risk for fall due to : - History of fall(s);Impaired balance/gait;Impaired vision;Medication side effect - - -  Risk for fall due to: Comment - back pain, wears eyeglasses - - -  Follow up Falls prevention discussed - - - -   FALL RISK PREVENTION PERTAINING TO THE HOME:  Any stairs in or around the home? Yes  If so, do they handrails? Yes   Home free of loose throw rugs in walkways, pet beds, electrical cords, etc? Yes  Adequate lighting in your home to reduce risk of falls? Yes   ASSISTIVE DEVICES UTILIZED TO PREVENT FALLS:  Life alert? No  Use of a cane, walker or w/c? No  Grab bars in the bathroom? Yes  Shower chair or bench in shower? No  Elevated toilet seat or a handicapped toilet? Yes   DME ORDERS:  DME order needed?  No   TIMED UP AND GO:  Was the test performed? Yes .  Length of time to ambulate 10 feet: 5 sec.   GAIT:  Appearance of gait: Gait stead-fast and without the use of an assistive device.   Education: Fall risk prevention has been discussed.  Intervention(s) required? No    Depression Screen PHQ 2/9 Scores 01/31/2019 05/09/2018 01/23/2018 01/23/2018  PHQ - 2 Score 0 0 0 0  PHQ- 9 Score - 0 0 -     Cognitive Function     6CIT Screen 01/31/2019 01/23/2018  What Year? 0 points 0 points  What month? 0 points 0 points  What time? 0 points 0 points  Count back from 20 0 points 0 points  Months in reverse 0 points 0 points  Repeat phrase 0 points 4 points  Total Score 0 4    Immunization History  Administered Date(s) Administered  . Influenza, High Dose Seasonal PF 08/30/2017  . Influenza,inj,Quad PF,6+ Mos 09/01/2015, 09/09/2016, 08/30/2018  . Pneumococcal Conjugate-13 09/01/2015  . Pneumococcal Polysaccharide-23  09/08/2017  . Tdap 08/30/2017    Qualifies for Shingles Vaccine? Yes . Due for Shingrix. Education has been provided regarding the importance of this vaccine. Pt has been advised to call insurance company to determine out of pocket expense. Advised may also receive vaccine at local pharmacy or Health Dept. Verbalized acceptance and understanding.  Tdap: Up to date  Flu Vaccine: Up to date  Pneumococcal Vaccine: Up to date   Screening Tests Health Maintenance  Topic Date Due  . URINE MICROALBUMIN  08/30/2018  . HEMOGLOBIN A1C  12/13/2018  . FOOT EXAM  12/29/2018  . MAMMOGRAM  01/25/2019  . OPHTHALMOLOGY EXAM  08/23/2019  . COLONOSCOPY  10/29/2024  . TETANUS/TDAP  08/31/2027  . INFLUENZA VACCINE  Completed  . DEXA SCAN  Completed  . PNA vac Low Risk Adult  Completed  . Hepatitis C Screening  Discontinued    Cancer Screenings:  Colorectal Screening: Completed 10/29/14. Repeat every 10 years.   Mammogram: Completed 01/25/18. Repeat every year;  Ordered today. Pt provided with contact information and advised to call to schedule appt.   Bone Density: Completed 11/07/16. Results reflect  OSTEOPOROSIS. Repeat every 2 years. Ordered today. Pt provided with contact information and advised to call to schedule appt.   Lung Cancer Screening: (Low Dose CT Chest  recommended if Age 85-80 years, 30 pack-year currently smoking OR have quit w/in 15years.) does not qualify.   Lung Cancer Screening Referral: An Epic message has been sent to Glenna Fellows, RN (Oncology Nurse Navigator) regarding the possible need for this exam. Ines Bloomer will review the patient's chart to determine if the patient truly qualifies for the exam. If the patient qualifies, Ines Bloomer will order the Low Dose CT of the chest to facilitate the scheduling of this exam.  Additional Screening:  Hepatitis C Screening: does qualify; Completed 01/24/18  Vision Screening: Recommended annual ophthalmology exams for early detection of  glaucoma and other disorders of the eye. Is the patient up to date with their annual eye exam?  Yes  Who is the provider or what is the name of the office in which the pt attends annual eye exams? [facility]  Dental Screening: Recommended annual dental exams for proper oral hygiene  Community Resource Referral:  CRR required this visit?  No      Plan:     I have personally reviewed and addressed the Medicare Annual Wellness questionnaire and have noted the following in the patient's chart:  A. Medical and social history B. Use of alcohol, tobacco or illicit drugs  C. Current medications and supplements D. Functional ability and status E.  Nutritional status F.  Physical activity G. Advance directives H. List of other physicians I.  Hospitalizations, surgeries, and ER visits in previous 12 months J.  Vitals K. Screenings such as hearing and vision if needed, cognitive and depression L. Referrals and appointments   In addition, I have reviewed and discussed with patient certain preventive protocols, quality metrics, and best practice recommendations. A written personalized care plan for preventive services as well as general preventive health recommendations were provided to patient.   Signed,  Reather Littler, LPN Nurse Health Advisor   Nurse Notes: patient is participating in research trial for diabetes with [employer] and managed by Dr. Einar Crow. Copy of research trial card put in chart.

## 2019-01-31 NOTE — Patient Instructions (Addendum)
Judy Bolton , Thank you for taking time to come for your Medicare Wellness Visit. I appreciate your ongoing commitment to your health goals. Please review the following plan we discussed and let me know if I can assist you in the future.   Screening recommendations/referrals: Colonoscopy: done 10/29/14. Repeat in 2025 Mammogram: done 01/25/18 Please call [phone] to schedule your mammogram and bone density screening.  Bone Density: done 11/07/16. Recommended yearly ophthalmology/optometry visit for glaucoma screening and checkup Recommended yearly dental visit for hygiene and checkup  Vaccinations: Influenza vaccine: done 08/30/18 Pneumococcal vaccine: done 09/08/17 Tdap vaccine: done 08/30/17 Shingles vaccine: Shingrix discussed. Please contact your pharmacy for coverage information.     Advanced directives: Advance directive discussed with you today. I have provided a copy for you to complete at home and have notarized. Once this is complete please bring a copy in to our office so we can scan it into your chart.  Conditions/risks identified: Recommend increasing physical activity to 150 minutes per week.   Next appointment: Please follow up in one year for your Medicare Annual Wellness visit.     Preventive Care 35 Years and Older, Female Preventive care refers to lifestyle choices and visits with your health care provider that can promote health and wellness. What does preventive care include?  A yearly physical exam. This is also called an annual well check.  Dental exams once or twice a year.  Routine eye exams. Ask your health care provider how often you should have your eyes checked.  Personal lifestyle choices, including:  Daily care of your teeth and gums.  Regular physical activity.  Eating a healthy diet.  Avoiding tobacco and drug use.  Limiting alcohol use.  Practicing safe sex.  Taking low-dose aspirin every day.  Taking vitamin and mineral supplements as  recommended by your health care provider. What happens during an annual well check? The services and screenings done by your health care provider during your annual well check will depend on your age, overall health, lifestyle risk factors, and family history of disease. Counseling  Your health care provider may ask you questions about your:  Alcohol use.  Tobacco use.  Drug use.  Emotional well-being.  Home and relationship well-being.  Sexual activity.  Eating habits.  History of falls.  Memory and ability to understand (cognition).  Work and work Astronomer.  Reproductive health. Screening  You may have the following tests or measurements:  Height, weight, and BMI.  Blood pressure.  Lipid and cholesterol levels. These may be checked every 5 years, or more frequently if you are over 34 years old.  Skin check.  Lung cancer screening. You may have this screening every year starting at age 84 if you have a 30-pack-year history of smoking and currently smoke or have quit within the past 15 years.  Fecal occult blood test (FOBT) of the stool. You may have this test every year starting at age 52.  Flexible sigmoidoscopy or colonoscopy. You may have a sigmoidoscopy every 5 years or a colonoscopy every 10 years starting at age 89.  Hepatitis C blood test.  Hepatitis B blood test.  Sexually transmitted disease (STD) testing.  Diabetes screening. This is done by checking your blood sugar (glucose) after you have not eaten for a while (fasting). You may have this done every 1-3 years.  Bone density scan. This is done to screen for osteoporosis. You may have this done starting at age 28.  Mammogram. This may be done every  1-2 years. Talk to your health care provider about how often you should have regular mammograms. Talk with your health care provider about your test results, treatment options, and if necessary, the need for more tests. Vaccines  Your health care  provider may recommend certain vaccines, such as:  Influenza vaccine. This is recommended every year.  Tetanus, diphtheria, and acellular pertussis (Tdap, Td) vaccine. You may need a Td booster every 10 years.  Zoster vaccine. You may need this after age 78.  Pneumococcal 13-valent conjugate (PCV13) vaccine. One dose is recommended after age 69.  Pneumococcal polysaccharide (PPSV23) vaccine. One dose is recommended after age 31. Talk to your health care provider about which screenings and vaccines you need and how often you need them. This information is not intended to replace advice given to you by your health care provider. Make sure you discuss any questions you have with your health care provider. Document Released: 12/19/2015 Document Revised: 08/11/2016 Document Reviewed: 09/23/2015 Elsevier Interactive Patient Education  2017 [place] Prevention in the Home Falls can cause injuries. They can happen to people of all ages. There are many things you can do to make your home safe and to help prevent falls. What can I do on the outside of my home?  Regularly fix the edges of walkways and driveways and fix any cracks.  Remove anything that might make you trip as you walk through a door, such as a raised step or threshold.  Trim any bushes or trees on the path to your home.  Use bright outdoor lighting.  Clear any walking paths of anything that might make someone trip, such as rocks or tools.  Regularly check to see if handrails are loose or broken. Make sure that both sides of any steps have handrails.  Any raised decks and porches should have guardrails on the edges.  Have any leaves, snow, or ice cleared regularly.  Use sand or salt on walking paths during winter.  Clean up any spills in your garage right away. This includes oil or grease spills. What can I do in the bathroom?  Use night lights.  Install grab bars by the toilet and in the tub and shower. Do  not use towel bars as grab bars.  Use non-skid mats or decals in the tub or shower.  If you need to sit down in the shower, use a plastic, non-slip stool.  Keep the floor dry. Clean up any water that spills on the floor as soon as it happens.  Remove soap buildup in the tub or shower regularly.  Attach bath mats securely with double-sided non-slip rug tape.  Do not have throw rugs and other things on the floor that can make you trip. What can I do in the bedroom?  Use night lights.  Make sure that you have a light by your bed that is easy to reach.  Do not use any sheets or blankets that are too big for your bed. They should not hang down onto the floor.  Have a firm chair that has side arms. You can use this for support while you get dressed.  Do not have throw rugs and other things on the floor that can make you trip. What can I do in the kitchen?  Clean up any spills right away.  Avoid walking on wet floors.  Keep items that you use a lot in easy-to-reach places.  If you need to reach something above you, use a strong  step stool that has a grab bar.  Keep electrical cords out of the way.  Do not use floor polish or wax that makes floors slippery. If you must use wax, use non-skid floor wax.  Do not have throw rugs and other things on the floor that can make you trip. What can I do with my stairs?  Do not leave any items on the stairs.  Make sure that there are handrails on both sides of the stairs and use them. Fix handrails that are broken or loose. Make sure that handrails are as long as the stairways.  Check any carpeting to make sure that it is firmly attached to the stairs. Fix any carpet that is loose or worn.  Avoid having throw rugs at the top or bottom of the stairs. If you do have throw rugs, attach them to the floor with carpet tape.  Make sure that you have a light switch at the top of the stairs and the bottom of the stairs. If you do not have them,  ask someone to add them for you. What else can I do to help prevent falls?  Wear shoes that:  Do not have high heels.  Have rubber bottoms.  Are comfortable and fit you well.  Are closed at the toe. Do not wear sandals.  If you use a stepladder:  Make sure that it is fully opened. Do not climb a closed stepladder.  Make sure that both sides of the stepladder are locked into place.  Ask someone to hold it for you, if possible.  Clearly mark and make sure that you can see:  Any grab bars or handrails.  First and last steps.  Where the edge of each step is.  Use tools that help you move around (mobility aids) if they are needed. These include:  Canes.  Walkers.  Scooters.  Crutches.  Turn on the lights when you go into a dark area. Replace any light bulbs as soon as they burn out.  Set up your furniture so you have a clear path. Avoid moving your furniture around.  If any of your floors are uneven, fix them.  If there are any pets around you, be aware of where they are.  Review your medicines with your doctor. Some medicines can make you feel dizzy. This can increase your chance of falling. Ask your doctor what other things that you can do to help prevent falls. This information is not intended to replace advice given to you by your health care provider. Make sure you discuss any questions you have with your health care provider. Document Released: 09/18/2009 Document Revised: 04/29/2016 Document Reviewed: 12/27/2014 Elsevier Interactive Patient Education  2017 [employer].

## 2019-02-05 DIAGNOSIS — R0602 Shortness of breath: Secondary | ICD-10-CM | POA: Diagnosis not present

## 2019-02-05 DIAGNOSIS — J449 Chronic obstructive pulmonary disease, unspecified: Secondary | ICD-10-CM | POA: Diagnosis not present

## 2019-02-13 ENCOUNTER — Other Ambulatory Visit: Payer: Self-pay | Admitting: Family Medicine

## 2019-02-13 DIAGNOSIS — E785 Hyperlipidemia, unspecified: Secondary | ICD-10-CM

## 2019-02-15 ENCOUNTER — Ambulatory Visit
Admission: RE | Admit: 2019-02-15 | Discharge: 2019-02-15 | Disposition: A | Discharge status: Home / Self Care | Payer: Medicare HMO | Source: Ambulatory Visit | Attending: Family Medicine | Admitting: Family Medicine

## 2019-02-15 ENCOUNTER — Other Ambulatory Visit: Payer: Self-pay

## 2019-02-15 DIAGNOSIS — M85852 Other specified disorders of bone density and structure, left thigh: Secondary | ICD-10-CM | POA: Insufficient documentation

## 2019-02-15 DIAGNOSIS — Z780 Asymptomatic menopausal state: Secondary | ICD-10-CM

## 2019-02-15 DIAGNOSIS — Z13820 Encounter for screening for osteoporosis: Secondary | ICD-10-CM | POA: Insufficient documentation

## 2019-02-15 DIAGNOSIS — Z1231 Encounter for screening mammogram for malignant neoplasm of breast: Secondary | ICD-10-CM | POA: Diagnosis not present

## 2019-03-01 DIAGNOSIS — R69 Illness, unspecified: Secondary | ICD-10-CM | POA: Diagnosis not present

## 2019-03-07 ENCOUNTER — Other Ambulatory Visit: Payer: Self-pay | Admitting: Family Medicine

## 2019-03-07 DIAGNOSIS — I10 Essential (primary) hypertension: Secondary | ICD-10-CM

## 2019-03-09 ENCOUNTER — Encounter: Payer: Self-pay | Admitting: Family Medicine

## 2019-03-09 ENCOUNTER — Ambulatory Visit (INDEPENDENT_AMBULATORY_CARE_PROVIDER_SITE_OTHER): Payer: Medicare HMO | Admitting: Family Medicine

## 2019-03-09 ENCOUNTER — Other Ambulatory Visit: Payer: Self-pay

## 2019-03-09 VITALS — BP 130/80 | HR 80 | Ht 62.0 in | Wt 143.0 lb

## 2019-03-09 DIAGNOSIS — E785 Hyperlipidemia, unspecified: Secondary | ICD-10-CM

## 2019-03-09 DIAGNOSIS — R69 Illness, unspecified: Secondary | ICD-10-CM

## 2019-03-09 DIAGNOSIS — I10 Essential (primary) hypertension: Secondary | ICD-10-CM | POA: Diagnosis not present

## 2019-03-09 MED ORDER — METOPROLOL TARTRATE 25 MG PO TABS: 25.0000 mg | ORAL_TABLET | Freq: Two times a day (BID) | ORAL | 1 refills | Status: DC

## 2019-03-09 MED ORDER — GEMFIBROZIL 600 MG PO TABS: 600.0000 mg | ORAL_TABLET | Freq: Two times a day (BID) | ORAL | 1 refills | Status: DC

## 2019-03-09 MED ORDER — ATORVASTATIN CALCIUM 40 MG PO TABS: 40.0000 mg | ORAL_TABLET | Freq: Every day | ORAL | 1 refills | Status: DC

## 2019-03-09 MED ORDER — HYDROCHLOROTHIAZIDE 25 MG PO TABS: 25.0000 mg | ORAL_TABLET | Freq: Every day | ORAL | 1 refills | Status: DC

## 2019-03-09 NOTE — Progress Notes (Signed)
Date:  03/09/2019   Name:  Judy Bolton   DOB:  Nov 08, 1946   MRN:  [id number]   Chief Complaint: Hypertension and Hyperlipidemia  Hypertension  This is a chronic problem. The current episode started more than 1 year ago. The problem is unchanged. The problem is controlled. Associated symptoms include shortness of breath. Pertinent negatives include no anxiety, blurred vision, chest pain, headaches, malaise/fatigue, neck pain, orthopnea, palpitations, peripheral edema, PND or sweats. (Reactive airway disease) There are no associated agents to hypertension. Risk factors for coronary artery disease include diabetes mellitus, obesity, post-menopausal state and dyslipidemia. Past treatments include diuretics and beta blockers. The current treatment provides moderate improvement. There are no compliance problems.  Hypertensive end-organ damage includes CVA. There is no history of angina, kidney disease, CAD/MI, heart failure, left ventricular hypertrophy, PVD or retinopathy. There is no history of chronic renal disease, a hypertension causing med or renovascular disease.  Hyperlipidemia  This is a chronic problem. The problem is controlled. Recent lipid tests were reviewed and are normal. Exacerbating diseases include diabetes and obesity. She has no history of chronic renal disease, hypothyroidism, liver disease or nephrotic syndrome. Factors aggravating her hyperlipidemia include thiazides. Associated symptoms include shortness of breath. Pertinent negatives include no chest pain, focal sensory loss, focal weakness, leg pain or myalgias. Current antihyperlipidemic treatment includes statins. The current treatment provides moderate improvement of lipids. There are no compliance problems.  Risk factors for coronary artery disease include diabetes mellitus, dyslipidemia, hypertension and female sex.    Review of Systems  Constitutional: Negative.  Negative for chills, fatigue, fever, malaise/fatigue and  unexpected weight change.  HENT: Negative for congestion, ear discharge, ear pain, postnasal drip, rhinorrhea, sinus pressure, sneezing and sore throat.   Eyes: Negative for blurred vision, photophobia, pain, discharge, redness and itching.  Respiratory: Positive for shortness of breath. Negative for cough, wheezing and stridor.   Cardiovascular: Negative for chest pain, palpitations, orthopnea and PND.  Gastrointestinal: Negative for abdominal pain, blood in stool, constipation, diarrhea, nausea and vomiting.  Endocrine: Negative for cold intolerance, heat intolerance, polydipsia, polyphagia and polyuria.  Genitourinary: Negative for dysuria, flank pain, frequency, hematuria, menstrual problem, pelvic pain, urgency, vaginal bleeding and vaginal discharge.  Musculoskeletal: Negative for arthralgias, back pain, myalgias and neck pain.  Skin: Negative for rash.  Allergic/Immunologic: Negative for environmental allergies and food allergies.  Neurological: Negative for dizziness, focal weakness, weakness, light-headedness, numbness and headaches.  Hematological: Negative for adenopathy. Does not bruise/bleed easily.  Psychiatric/Behavioral: Negative for dysphoric mood. The patient is not nervous/anxious.     Patient Active Problem List   Diagnosis Date Noted  . Essential hypertension 08/30/2017  . Gastroesophageal reflux disease 08/30/2017  . Aortic atherosclerosis (HCC) 05/31/2017  . PAD (peripheral artery disease) (HCC) 05/31/2017  . Encounter for long-term (current) use of high-risk medication 12/28/2016  . Bilateral hand pain 11/08/2016  . Elevated C-reactive protein 11/08/2016  . Swelling of joint of right hand 11/08/2016  . Type 2 diabetes mellitus with microalbuminuria, without long-term current use of insulin (HCC) 07/06/2016  . Acute anxiety 04/16/2016  . Bronchitis 04/16/2016  . Centrilobular emphysema (HCC) 04/16/2016  . TIA (transient ischemic attack) 02/29/2016  . Lumbar  stenosis with neurogenic claudication 04/16/2015  . Spondylosis of lumbar region without myelopathy or radiculopathy 04/16/2015  . Hypertriglyceridemia 10/15/2014    Allergies  Allergen Reactions  . Betadine [Povidone Iodine] Swelling  . Codeine Itching  . Latex Itching and Swelling    (urinary catheter)  .  Meloxicam Swelling  . Penicillins Other (See Comments)    Paralysis  Has patient had a PCN reaction causing immediate rash, facial/tongue/throat swelling, SOB or lightheadedness with hypotension: No Has patient had a PCN reaction causing severe rash involving mucus membranes or skin necrosis: No Has patient had a PCN reaction that required hospitalization Yes Has patient had a PCN reaction occurring within the last 10 years: No If all of the above answers are "NO", then may proceed with Cephalosporin use.    Past Surgical History:  Procedure Laterality Date  . ABDOMINAL HYSTERECTOMY    . ANTERIOR (CYSTOCELE) AND POSTERIOR REPAIR (RECTOCELE) WITH XENFORM GRAFT AND SACROSPINOUS FIXATION    . APPENDECTOMY    . BROW LIFT Bilateral 09/05/2018   Procedure: BLEPHAROPLASTY UPPER EYELID W/EXCESS SKIN;  Surgeon: Imagene Riches, MD;  Location: [facility];  Service: Ophthalmology;  Laterality: Bilateral;  . CTR Bilateral   . LUMBAR LAMINECTOMY/DECOMPRESSION MICRODISCECTOMY N/A 07/27/2017   Procedure: LUMBAR LAMINECTOMY/DECOMPRESSION MICRODISCECTOMY 3 LEVELS-L3-S1;  Surgeon: Venetia Night, MD;  Location: [facility];  Service: Neurosurgery;  Laterality: N/A;  . PTOSIS REPAIR Bilateral 09/05/2018   Procedure: PTOSIS REPAIR RESECT EX;  Surgeon: Imagene Riches, MD;  Location: [facility];  Service: Ophthalmology;  Laterality: Bilateral;  Diabetic - oral meds sleep apnea Latexd allergy  . ROTATOR CUFF REPAIR Right     Social History   Tobacco Use  . Smoking status: Former Smoker    Packs/day: 1.50    Years: 28.00    Pack years: 42.00    Types: Cigarettes    Last attempt  to quit: 08/14/2000    Years since quitting: 18.5  . Smokeless tobacco: Never Used  . Tobacco comment: smoking cessatiuon materials not required  Substance Use Topics  . Alcohol use: No  . Drug use: No     Medication list has been reviewed and updated.  Current Meds  Medication Sig  . albuterol (PROVENTIL HFA;VENTOLIN HFA) 108 (90 Base) MCG/ACT inhaler Inhale 2 puffs into the lungs every 6 (six) hours as needed for wheezing or shortness of breath.  Marland Kitchen albuterol (PROVENTIL) (2.5 MG/3ML) 0.083% nebulizer solution Take 3 mLs (2.5 mg total) by nebulization every 6 (six) hours as needed for wheezing or shortness of breath.  Marland Kitchen aspirin EC 81 MG tablet Take by mouth.  Marland Kitchen atorvastatin (LIPITOR) 40 MG tablet TAKE 1 TABLET BY MOUTH EVERY DAY  . etodolac (LODINE XL) 500 MG 24 hr tablet TAKE 1 TABLET (500 MG TOTAL) DAILY BY MOUTH. (Patient taking differently: Take 500 mg by mouth daily. PRN)  . gemfibrozil (LOPID) 600 MG tablet TAKE 1 TABLET BY MOUTH TWICE A DAY  . hydrochlorothiazide (HYDRODIURIL) 25 MG tablet TAKE 1 TABLET BY MOUTH EVERY DAY  . ipratropium (ATROVENT) 0.02 % nebulizer solution Inhale 2.5 mLs into the lungs 4 (four) times daily.  . metFORMIN (GLUCOPHAGE) 1000 MG tablet TAKE 1 TABLET BY MOUTH TWICE A DAY (Patient taking differently: Take 500 mg by mouth 2 (two) times daily with a meal. Dr Dareen Piano)  . metoprolol tartrate (LOPRESSOR) 25 MG tablet TAKE 1 TABLET BY MOUTH TWICE A DAY  . montelukast (SINGULAIR) 10 MG tablet Take 1 tablet by mouth daily.  Marland Kitchen omeprazole (PRILOSEC) 20 MG capsule Take 20 mg by mouth daily. otc  . predniSONE (DELTASONE) 10 MG tablet Take 1 tablet (10 mg total) by mouth daily with breakfast.    PHQ 2/9 Scores 03/09/2019 01/31/2019 05/09/2018 01/23/2018  PHQ - 2 Score 0 0 0 0  PHQ- 9 Score 0 - 0 0    BP Readings from Last 3 Encounters:  03/09/19 130/80  01/31/19 132/90  11/17/18 (!) 148/78    Physical Exam Constitutional:      General: She is not in acute  distress.    Appearance: Normal appearance. She is not diaphoretic.  HENT:     Head: Normocephalic and atraumatic.     Right Ear: Tympanic membrane, ear canal and external ear normal.     Left Ear: Tympanic membrane, ear canal and external ear normal.     Nose: Nose normal. No congestion or rhinorrhea.     Mouth/Throat:     Mouth: Mucous membranes are moist.     Pharynx: Oropharynx is clear. No oropharyngeal exudate or posterior oropharyngeal erythema.  Eyes:     General:        Right eye: No discharge.        Left eye: No discharge.     Conjunctiva/sclera: Conjunctivae normal.     Pupils: Pupils are equal, round, and reactive to light.  Neck:     Musculoskeletal: Normal range of motion and neck supple.     Thyroid: No thyromegaly.     Vascular: No JVD.  Cardiovascular:     Rate and Rhythm: Normal rate and regular rhythm.     Pulses: Normal pulses.     Heart sounds: Normal heart sounds. No murmur. No friction rub. No gallop.   Pulmonary:     Effort: Pulmonary effort is normal.     Breath sounds: Normal breath sounds.  Abdominal:     General: Bowel sounds are normal.     Palpations: Abdomen is soft. There is no mass.     Tenderness: There is no abdominal tenderness. There is no guarding.  Musculoskeletal: Normal range of motion.  Lymphadenopathy:     Cervical: No cervical adenopathy.  Skin:    General: Skin is warm and dry.  Neurological:     General: No focal deficit present.     Mental Status: She is alert.     Deep Tendon Reflexes: Reflexes are normal and symmetric.     Wt Readings from Last 3 Encounters:  03/09/19 143 lb (64.9 kg)  01/31/19 144 lb 3.2 oz (65.4 kg)  11/17/18 138 lb (62.6 kg)    BP 130/80   Pulse 80   Ht 5\' 2"  (1.575 m)   Wt 143 lb (64.9 kg)   BMI 26.16 kg/m   Assessment and Plan:  1. Hyperlipidemia, unspecified hyperlipidemia type .  Controlled.  Continue atorvastatin 40 mg and gemfibrozil 600 mg twice a day.  Will check lipid panel. -  atorvastatin (LIPITOR) 40 MG tablet; Take 1 tablet (40 mg total) by mouth daily.  Dispense: 90 tablet; Refill: 1 - gemfibrozil (LOPID) 600 MG tablet; Take 1 tablet (600 mg total) by mouth 2 (two) times daily.  Dispense: 180 tablet; Refill: 1 - Lipid Panel With LDL/HDL Ratio  2. Essential hypertension Chronic.  Controlled.  Continue hydrochlorothiazide 25 mg once a day and metoprolol 25 mg twice a day.  Will check renal function panel to assess GFR. - hydrochlorothiazide (HYDRODIURIL) 25 MG tablet; Take 1 tablet (25 mg total) by mouth daily.  Dispense: 90 tablet; Refill: 1 - metoprolol tartrate (LOPRESSOR) 25 MG tablet; Take 1 tablet (25 mg total) by mouth 2 (two) times daily.  Dispense: 180 tablet; Refill: 1 - Renal Function Panel  3. Taking medication for chronic disease Patient is on a statin for control  of cholesterol we will check hepatic panel to rule out hepatotoxicity. - Hepatic function panel

## 2019-03-30 DIAGNOSIS — R69 Illness, unspecified: Secondary | ICD-10-CM | POA: Diagnosis not present

## 2019-04-02 DIAGNOSIS — E785 Hyperlipidemia, unspecified: Secondary | ICD-10-CM | POA: Diagnosis not present

## 2019-04-02 DIAGNOSIS — R809 Proteinuria, unspecified: Secondary | ICD-10-CM | POA: Diagnosis not present

## 2019-04-02 DIAGNOSIS — E538 Deficiency of other specified B group vitamins: Secondary | ICD-10-CM | POA: Diagnosis not present

## 2019-04-02 DIAGNOSIS — E1129 Type 2 diabetes mellitus with other diabetic kidney complication: Secondary | ICD-10-CM | POA: Diagnosis not present

## 2019-04-02 DIAGNOSIS — I10 Essential (primary) hypertension: Secondary | ICD-10-CM | POA: Diagnosis not present

## 2019-05-23 ENCOUNTER — Other Ambulatory Visit: Payer: Self-pay

## 2019-05-23 ENCOUNTER — Encounter: Payer: Self-pay | Admitting: Family Medicine

## 2019-05-23 ENCOUNTER — Ambulatory Visit (INDEPENDENT_AMBULATORY_CARE_PROVIDER_SITE_OTHER): Payer: Medicare HMO | Admitting: Family Medicine

## 2019-05-23 VITALS — BP 120/70 | HR 100 | Temp 98.4°F | Ht 62.0 in | Wt 141.0 lb

## 2019-05-23 DIAGNOSIS — J440 Chronic obstructive pulmonary disease with acute lower respiratory infection: Secondary | ICD-10-CM | POA: Diagnosis not present

## 2019-05-23 DIAGNOSIS — J0100 Acute maxillary sinusitis, unspecified: Secondary | ICD-10-CM

## 2019-05-23 DIAGNOSIS — J209 Acute bronchitis, unspecified: Secondary | ICD-10-CM | POA: Diagnosis not present

## 2019-05-23 DIAGNOSIS — R69 Illness, unspecified: Secondary | ICD-10-CM | POA: Diagnosis not present

## 2019-05-23 MED ORDER — GUAIFENESIN-CODEINE 100-10 MG/5ML PO SYRP: 5.0000 mL | ORAL_SOLUTION | Freq: Four times a day (QID) | ORAL | 0 refills | Status: DC | PRN

## 2019-05-23 MED ORDER — AZITHROMYCIN 250 MG PO TABS: ORAL_TABLET | ORAL | 0 refills | Status: DC

## 2019-05-23 NOTE — Progress Notes (Signed)
Date:  05/23/2019   Name:  Judy Bolton   DOB:  July 02, 1946   MRN:  [id number]   Chief Complaint: Sinusitis (stayed with someone who has bronchitis. coughing with yellow production and SOB. has had 2 neb treatments this morning- last was at 9:00)  Sinusitis This is a new problem. The current episode started 1 to 4 weeks ago. The problem has been gradually improving since onset. There has been no fever. The pain is mild. Associated symptoms include congestion, coughing, shortness of breath, sinus pressure and a sore throat. Pertinent negatives include no chills, diaphoresis, ear pain, headaches, hoarse voice, neck pain, sneezing or swollen glands. (Prod yellow) Past treatments include oral decongestants (singulair). The treatment provided moderate relief.    Review of Systems  Constitutional: Negative for chills, diaphoresis and fever.  HENT: Positive for congestion, sinus pressure and sore throat. Negative for drooling, ear discharge, ear pain, hoarse voice and sneezing.   Respiratory: Positive for cough and shortness of breath. Negative for wheezing.   Cardiovascular: Negative for chest pain, palpitations and leg swelling.  Gastrointestinal: Negative for abdominal pain, blood in stool, constipation, diarrhea and nausea.  Endocrine: Negative for polydipsia.  Genitourinary: Negative for dysuria, frequency, hematuria and urgency.  Musculoskeletal: Negative for back pain, myalgias and neck pain.  Skin: Negative for rash.  Allergic/Immunologic: Negative for environmental allergies.  Neurological: Negative for dizziness and headaches.  Hematological: Does not bruise/bleed easily.  Psychiatric/Behavioral: Negative for suicidal ideas. The patient is not nervous/anxious.     Patient Active Problem List   Diagnosis Date Noted  . Essential hypertension 08/30/2017  . Gastroesophageal reflux disease 08/30/2017  . Aortic atherosclerosis (HCC) 05/31/2017  . PAD (peripheral artery disease)  (HCC) 05/31/2017  . Encounter for long-term (current) use of high-risk medication 12/28/2016  . Bilateral hand pain 11/08/2016  . Elevated C-reactive protein 11/08/2016  . Swelling of joint of right hand 11/08/2016  . Type 2 diabetes mellitus with microalbuminuria, without long-term current use of insulin (HCC) 07/06/2016  . Acute anxiety 04/16/2016  . Bronchitis 04/16/2016  . Centrilobular emphysema (HCC) 04/16/2016  . TIA (transient ischemic attack) 02/29/2016  . Lumbar stenosis with neurogenic claudication 04/16/2015  . Spondylosis of lumbar region without myelopathy or radiculopathy 04/16/2015  . Hypertriglyceridemia 10/15/2014    Allergies  Allergen Reactions  . Betadine [Povidone Iodine] Swelling  . Codeine Itching  . Latex Itching and Swelling    (urinary catheter)  . Meloxicam Swelling  . Penicillins Other (See Comments)    Paralysis  Has patient had a PCN reaction causing immediate rash, facial/tongue/throat swelling, SOB or lightheadedness with hypotension: No Has patient had a PCN reaction causing severe rash involving mucus membranes or skin necrosis: No Has patient had a PCN reaction that required hospitalization Yes Has patient had a PCN reaction occurring within the last 10 years: No If all of the above answers are "NO", then may proceed with Cephalosporin use.    Past Surgical History:  Procedure Laterality Date  . ABDOMINAL HYSTERECTOMY    . ANTERIOR (CYSTOCELE) AND POSTERIOR REPAIR (RECTOCELE) WITH XENFORM GRAFT AND SACROSPINOUS FIXATION    . APPENDECTOMY    . BROW LIFT Bilateral 09/05/2018   Procedure: BLEPHAROPLASTY UPPER EYELID W/EXCESS SKIN;  Surgeon: Imagene Riches, MD;  Location: [facility];  Service: Ophthalmology;  Laterality: Bilateral;  . CTR Bilateral   . LUMBAR LAMINECTOMY/DECOMPRESSION MICRODISCECTOMY N/A 07/27/2017   Procedure: LUMBAR LAMINECTOMY/DECOMPRESSION MICRODISCECTOMY 3 LEVELS-L3-S1;  Surgeon: Venetia Night, MD;  Location:  [facility]  ORS;  Service: Neurosurgery;  Laterality: N/A;  . PTOSIS REPAIR Bilateral 09/05/2018   Procedure: PTOSIS REPAIR RESECT EX;  Surgeon: Karle Starch, MD;  Location: [place];  Service: Ophthalmology;  Laterality: Bilateral;  Diabetic - oral meds sleep apnea Latexd allergy  . ROTATOR CUFF REPAIR Right     Social History   Tobacco Use  . Smoking status: Former Smoker    Packs/day: 1.50    Years: 28.00    Pack years: 42.00    Types: Cigarettes    Quit date: 08/14/2000    Years since quitting: 18.7  . Smokeless tobacco: Never Used  . Tobacco comment: smoking cessatiuon materials not required  Substance Use Topics  . Alcohol use: No  . Drug use: No     Medication list has been reviewed and updated.  Current Meds  Medication Sig  . albuterol (PROVENTIL HFA;VENTOLIN HFA) 108 (90 Base) MCG/ACT inhaler Inhale 2 puffs into the lungs every 6 (six) hours as needed for wheezing or shortness of breath.  Marland Kitchen albuterol (PROVENTIL) (2.5 MG/3ML) 0.083% nebulizer solution Take 3 mLs (2.5 mg total) by nebulization every 6 (six) hours as needed for wheezing or shortness of breath.  Marland Kitchen aspirin EC 81 MG tablet Take by mouth.  Marland Kitchen atorvastatin (LIPITOR) 40 MG tablet Take 1 tablet (40 mg total) by mouth daily.  Marland Kitchen etodolac (LODINE XL) 500 MG 24 hr tablet TAKE 1 TABLET (500 MG TOTAL) DAILY BY MOUTH. (Patient taking differently: Take 500 mg by mouth daily. PRN)  . gemfibrozil (LOPID) 600 MG tablet Take 1 tablet (600 mg total) by mouth 2 (two) times daily.  . hydrochlorothiazide (HYDRODIURIL) 25 MG tablet Take 1 tablet (25 mg total) by mouth daily.  Marland Kitchen ipratropium (ATROVENT) 0.02 % nebulizer solution Inhale 2.5 mLs into the lungs 4 (four) times daily.  Marland Kitchen liraglutide (VICTOZA) 18 MG/3ML SOPN Inject 1.2 mg into the skin daily. hilary blackwood  . metFORMIN (GLUCOPHAGE) 1000 MG tablet TAKE 1 TABLET BY MOUTH TWICE A DAY (Patient taking differently: Take 500 mg by mouth 2 (two) times daily with a meal. Dr  Ouida Sills)  . metoprolol tartrate (LOPRESSOR) 25 MG tablet Take 1 tablet (25 mg total) by mouth 2 (two) times daily.  . montelukast (SINGULAIR) 10 MG tablet Take 1 tablet by mouth daily.  Marland Kitchen omeprazole (PRILOSEC) 20 MG capsule Take 20 mg by mouth daily. otc  . predniSONE (DELTASONE) 10 MG tablet Take 1 tablet (10 mg total) by mouth daily with breakfast.    PHQ 2/9 Scores 03/09/2019 01/31/2019 05/09/2018 01/23/2018  PHQ - 2 Score 0 0 0 0  PHQ- 9 Score 0 - 0 0    BP Readings from Last 3 Encounters:  05/23/19 120/70  03/09/19 130/80  01/31/19 132/90    Physical Exam Vitals signs and nursing note reviewed.  Constitutional:      Appearance: She is well-developed.  HENT:     Head: Normocephalic.     Right Ear: Hearing, tympanic membrane, ear canal and external ear normal.     Left Ear: Hearing, tympanic membrane, ear canal and external ear normal.     Nose: No congestion or rhinorrhea.     Right Turbinates: Swollen.     Left Turbinates: Swollen.     Right Sinus: Maxillary sinus tenderness present.     Left Sinus: Maxillary sinus tenderness present.     Mouth/Throat:     Mouth: Mucous membranes are moist.  Eyes:     General: Lids are everted,  no foreign bodies appreciated. No scleral icterus.       Left eye: No foreign body or hordeolum.     Conjunctiva/sclera: Conjunctivae normal.     Right eye: Right conjunctiva is not injected.     Left eye: Left conjunctiva is not injected.     Pupils: Pupils are equal, round, and reactive to light.  Neck:     Musculoskeletal: Normal range of motion and neck supple.     Thyroid: No thyromegaly.     Vascular: No JVD.     Trachea: No tracheal deviation.  Cardiovascular:     Rate and Rhythm: Normal rate and regular rhythm.     Pulses: Normal pulses.     Heart sounds: Normal heart sounds. No murmur. No friction rub. No gallop.   Pulmonary:     Effort: Pulmonary effort is normal. No respiratory distress.     Breath sounds: Normal breath sounds. No  stridor. No wheezing, rhonchi or rales.  Chest:     Chest wall: No tenderness.  Abdominal:     General: Bowel sounds are normal.     Palpations: Abdomen is soft. There is no mass.     Tenderness: There is no abdominal tenderness. There is no guarding or rebound.  Musculoskeletal: Normal range of motion.        General: No tenderness.  Lymphadenopathy:     Cervical: No cervical adenopathy.  Skin:    General: Skin is warm.     Findings: No rash.  Neurological:     Mental Status: She is alert and oriented to person, place, and time.     Cranial Nerves: No cranial nerve deficit.     Deep Tendon Reflexes: Reflexes normal.  Psychiatric:        Mood and Affect: Mood is not anxious or depressed.     Wt Readings from Last 3 Encounters:  05/23/19 141 lb (64 kg)  03/09/19 143 lb (64.9 kg)  01/31/19 144 lb 3.2 oz (65.4 kg)    BP 120/70   Pulse 100   Temp 98.4 F (36.9 C) (Oral)   Ht 5\' 2"  (1.575 m)   Wt 141 lb (64 kg)   SpO2 95%   BMI 25.79 kg/m   Assessment and Plan: 1. COPD with acute bronchitis (HCC) New onset bronchitis with underlying COPD.  Will give guaifenesin with codeine for the PRN cough.  We are treating the sinusitis with azithromycin at this time - azithromycin (ZITHROMAX) 250 MG tablet; 2 today then 1 a day for 4 days  Dispense: 6 tablet; Refill: 0 - guaiFENesin-codeine (ROBITUSSIN AC) 100-10 MG/5ML syrup; Take 5 mLs by mouth 4 (four) times daily as needed for cough.  Dispense: 118 mL; Refill: 0  2. Acute maxillary sinusitis, recurrence not specified Acute.  Initiate a azithromycin 250 mg 2 today followed by 1 a day for 4 days. - azithromycin (ZITHROMAX) 250 MG tablet; 2 today then 1 a day for 4 days  Dispense: 6 tablet; Refill: 0

## 2019-05-25 ENCOUNTER — Telehealth: Payer: Self-pay

## 2019-05-25 ENCOUNTER — Other Ambulatory Visit: Payer: Self-pay

## 2019-05-25 DIAGNOSIS — J209 Acute bronchitis, unspecified: Secondary | ICD-10-CM

## 2019-05-25 MED ORDER — LEVOFLOXACIN 500 MG PO TABS: 500.0000 mg | ORAL_TABLET | Freq: Every day | ORAL | 0 refills | Status: DC

## 2019-05-25 NOTE — Telephone Encounter (Signed)
Called with getting worse- sent in levaquin 500mg  x 5 days [facility] Judy Bolton

## 2019-05-28 ENCOUNTER — Telehealth: Payer: Self-pay | Admitting: *Deleted

## 2019-05-28 ENCOUNTER — Other Ambulatory Visit: Payer: Medicare HMO

## 2019-05-28 DIAGNOSIS — R6889 Other general symptoms and signs: Secondary | ICD-10-CM | POA: Diagnosis not present

## 2019-05-28 DIAGNOSIS — Z20822 Contact with and (suspected) exposure to covid-19: Secondary | ICD-10-CM

## 2019-05-28 NOTE — Telephone Encounter (Signed)
Contacted by Wynelle Cleveland, RN from [place]; she request that the pt be tested for COVID due to positive exposure; pt's DOB, address, and contact information [phone] verified; will attempt to contact pt.

## 2019-05-28 NOTE — Telephone Encounter (Signed)
Contacted pt to schedule testing; pt accepted at [facility] site 05/28/2019 at 1030; pt given address, location, and instructions that she and all occupants of a vehicle should wear mask; she verbalized understanding; orders placed per protocol.

## 2019-05-31 ENCOUNTER — Other Ambulatory Visit: Payer: Self-pay

## 2019-05-31 ENCOUNTER — Emergency Department
Admission: EM | Admit: 2019-05-31 | Discharge: 2019-05-31 | Disposition: A | Discharge status: Home / Self Care | Payer: Medicare HMO | Attending: Emergency Medicine | Admitting: Emergency Medicine

## 2019-05-31 ENCOUNTER — Encounter: Payer: Self-pay | Admitting: Emergency Medicine

## 2019-05-31 DIAGNOSIS — R197 Diarrhea, unspecified: Secondary | ICD-10-CM | POA: Diagnosis not present

## 2019-05-31 DIAGNOSIS — Z87891 Personal history of nicotine dependence: Secondary | ICD-10-CM | POA: Insufficient documentation

## 2019-05-31 DIAGNOSIS — Z7984 Long term (current) use of oral hypoglycemic drugs: Secondary | ICD-10-CM | POA: Insufficient documentation

## 2019-05-31 DIAGNOSIS — E1151 Type 2 diabetes mellitus with diabetic peripheral angiopathy without gangrene: Secondary | ICD-10-CM | POA: Insufficient documentation

## 2019-05-31 DIAGNOSIS — Z7982 Long term (current) use of aspirin: Secondary | ICD-10-CM | POA: Diagnosis not present

## 2019-05-31 DIAGNOSIS — R569 Unspecified convulsions: Secondary | ICD-10-CM | POA: Diagnosis not present

## 2019-05-31 DIAGNOSIS — U071 COVID-19: Secondary | ICD-10-CM | POA: Insufficient documentation

## 2019-05-31 DIAGNOSIS — R1111 Vomiting without nausea: Secondary | ICD-10-CM | POA: Diagnosis not present

## 2019-05-31 DIAGNOSIS — J432 Centrilobular emphysema: Secondary | ICD-10-CM | POA: Diagnosis not present

## 2019-05-31 DIAGNOSIS — I10 Essential (primary) hypertension: Secondary | ICD-10-CM | POA: Insufficient documentation

## 2019-05-31 DIAGNOSIS — R112 Nausea with vomiting, unspecified: Secondary | ICD-10-CM | POA: Diagnosis not present

## 2019-05-31 DIAGNOSIS — R110 Nausea: Secondary | ICD-10-CM | POA: Diagnosis not present

## 2019-05-31 LAB — URINALYSIS, COMPLETE (UACMP) WITH MICROSCOPIC
Bacteria, UA: NONE SEEN
Bilirubin Urine: NEGATIVE
Glucose, UA: NEGATIVE mg/dL
Hgb urine dipstick: NEGATIVE
Ketones, ur: NEGATIVE mg/dL
Leukocytes,Ua: NEGATIVE
Nitrite: NEGATIVE
Protein, ur: 100 mg/dL — AB
Specific Gravity, Urine: 1.015 (ref 1.005–1.030)
pH: 5 (ref 5.0–8.0)

## 2019-05-31 LAB — COMPREHENSIVE METABOLIC PANEL
ALT: 17 U/L (ref 0–44)
AST: 36 U/L (ref 15–41)
Albumin: 3.5 g/dL (ref 3.5–5.0)
Alkaline Phosphatase: 80 U/L (ref 38–126)
Anion gap: 16 — ABNORMAL HIGH (ref 5–15)
BUN: 33 mg/dL — ABNORMAL HIGH (ref 8–23)
CO2: 22 mmol/L (ref 22–32)
Calcium: 9.6 mg/dL (ref 8.9–10.3)
Chloride: 97 mmol/L — ABNORMAL LOW (ref 98–111)
Creatinine, Ser: 1.51 mg/dL — ABNORMAL HIGH (ref 0.44–1.00)
GFR calc Af Amer: 40 mL/min — ABNORMAL LOW (ref >60)
GFR calc non Af Amer: 35 mL/min — ABNORMAL LOW (ref >60)
Glucose, Bld: 143 mg/dL — ABNORMAL HIGH (ref 70–99)
Potassium: 4.3 mmol/L (ref 3.5–5.1)
Sodium: 135 mmol/L (ref 135–145)
Total Bilirubin: 0.8 mg/dL (ref 0.3–1.2)
Total Protein: 8 g/dL (ref 6.5–8.1)

## 2019-05-31 LAB — LIPASE, BLOOD: Lipase: 65 U/L — ABNORMAL HIGH (ref 11–51)

## 2019-05-31 LAB — CBC
HCT: 42 % (ref 36.0–46.0)
Hemoglobin: 14.2 g/dL (ref 12.0–15.0)
MCH: 28.5 pg (ref 26.0–34.0)
MCHC: 33.8 g/dL (ref 30.0–36.0)
MCV: 84.2 fL (ref 80.0–100.0)
Platelets: 235 10*3/uL (ref 150–400)
RBC: 4.99 MIL/uL (ref 3.87–5.11)
RDW: 13.1 % (ref 11.5–15.5)
WBC: 8.2 10*3/uL (ref 4.0–10.5)
nRBC: 0 % (ref 0.0–0.2)

## 2019-05-31 LAB — MAGNESIUM: Magnesium: 1.5 mg/dL — ABNORMAL LOW (ref 1.7–2.4)

## 2019-05-31 LAB — SARS CORONAVIRUS 2 BY RT PCR (HOSPITAL ORDER, PERFORMED IN ~~LOC~~ HOSPITAL LAB): SARS Coronavirus 2: POSITIVE — AB

## 2019-05-31 MED ORDER — SODIUM CHLORIDE 0.9 % IV BOLUS
1000.0000 mL | Freq: Once | INTRAVENOUS | Status: AC
  Administered 2019-05-31: 12:00:00 1000 mL via INTRAVENOUS

## 2019-05-31 MED ORDER — ONDANSETRON 4 MG PO TBDP: 4.0000 mg | ORAL_TABLET | Freq: Three times a day (TID) | ORAL | 0 refills | Status: DC | PRN

## 2019-05-31 MED ORDER — ONDANSETRON HCL 4 MG/2ML IJ SOLN
4.0000 mg | Freq: Once | INTRAMUSCULAR | Status: AC
  Administered 2019-05-31: 12:00:00 4 mg via INTRAVENOUS
  Filled 2019-05-31: qty 2

## 2019-05-31 MED ORDER — GUAIFENESIN-CODEINE 100-10 MG/5ML PO SOLN: 5.0000 mL | Freq: Four times a day (QID) | ORAL | 0 refills | Status: DC | PRN

## 2019-05-31 NOTE — ED Provider Notes (Signed)
[facility] Emergency Department Provider Note  Time seen: 11:47 AM  I have reviewed the triage vital signs and the nursing notes.   HISTORY  Chief Complaint Nausea   HPI Judy Bolton is a 73 y.o. female with a past medical history of arthritis, asthma, COPD, diabetes, hypertension, hyperlipidemia, presents to the emergency department  for nausea and vomiting.  According to the patient over the past 3 days she has had very frequent nausea vomiting and diarrhea.  Patient states she feels dehydrated.  States her muscles were "jumping" yesterday, this morning she was feeling tingling in her extremities.  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  Patient denies any shortness of breath but does state occasional cough like she needs to clear her throat.  Denies any known fever.  Past Medical History:  Diagnosis Date  . Acid reflux   . Arthritis   . Asthma   . Back pain   . Back pain   . COPD (chronic obstructive pulmonary disease) (HCC)   . Diabetes mellitus without complication (HCC)   . Hyperlipidemia   . Hypertension   . Seizure (HCC)    x1 - over 30 yrs ago  . Sleep apnea    No CPAP  . Stroke [facility])    March of 2017 vocabulary    Patient Active Problem List   Diagnosis Date Noted  . Essential hypertension 08/30/2017  . Gastroesophageal reflux disease 08/30/2017  . Aortic atherosclerosis (HCC) 05/31/2017  . PAD (peripheral artery disease) (HCC) 05/31/2017  . Encounter for long-term (current) use of high-risk medication 12/28/2016  . Bilateral hand pain 11/08/2016  . Elevated C-reactive protein 11/08/2016  . Swelling of joint of right hand 11/08/2016  . Type 2 diabetes mellitus with microalbuminuria, without long-term current use of insulin (HCC) 07/06/2016  . Acute anxiety 04/16/2016  . Bronchitis 04/16/2016  . Centrilobular emphysema (HCC) 04/16/2016  . TIA (transient ischemic attack)  02/29/2016  . Lumbar stenosis with neurogenic claudication 04/16/2015  . Spondylosis of lumbar region without myelopathy or radiculopathy 04/16/2015  . Hypertriglyceridemia 10/15/2014    Past Surgical History:  Procedure Laterality Date  . ABDOMINAL HYSTERECTOMY    . ANTERIOR (CYSTOCELE) AND POSTERIOR REPAIR (RECTOCELE) WITH XENFORM GRAFT AND SACROSPINOUS FIXATION    . APPENDECTOMY    . BROW LIFT Bilateral 09/05/2018   Procedure: BLEPHAROPLASTY UPPER EYELID W/EXCESS SKIN;  Surgeon: Imagene Riches, MD;  Location: [facility];  Service: Ophthalmology;  Laterality: Bilateral;  . CTR Bilateral   . LUMBAR LAMINECTOMY/DECOMPRESSION MICRODISCECTOMY N/A 07/27/2017   Procedure: LUMBAR LAMINECTOMY/DECOMPRESSION MICRODISCECTOMY 3 LEVELS-L3-S1;  Surgeon: Venetia Night, MD;  Location: [facility] ORS;  Service: Neurosurgery;  Laterality: N/A;  . PTOSIS REPAIR Bilateral 09/05/2018   Procedure: PTOSIS REPAIR RESECT EX;  Surgeon: Imagene Riches, MD;  Location: [facility];  Service: Ophthalmology;  Laterality: Bilateral;  Diabetic - oral meds sleep apnea Latexd allergy  . ROTATOR CUFF REPAIR Right     Prior to Admission medications   Medication Sig Start Date End Date Taking? Authorizing Provider  albuterol (PROVENTIL HFA;VENTOLIN HFA) 108 (90 Base) MCG/ACT inhaler Inhale 2 puffs into the lungs every 6 (six) hours as needed for wheezing or shortness of breath.    [provider]  albuterol (PROVENTIL) (2.5 MG/3ML) 0.083% nebulizer solution Take 3 mLs (2.5 mg total) by nebulization every 6 (six) hours as needed for wheezing or shortness of breath. 07/10/18  Duanne Limerick, MD  aspirin EC 81 MG tablet Take by mouth.    [provider]  atorvastatin (LIPITOR) 40 MG tablet Take 1 tablet (40 mg total) by mouth daily. 03/09/19   Duanne Limerick, MD  etodolac (LODINE XL) 500 MG 24 hr tablet TAKE 1 TABLET (500 MG TOTAL) DAILY BY MOUTH. Patient taking differently: Take 500 mg by mouth  daily. PRN 12/01/18   Duanne Limerick, MD  gemfibrozil (LOPID) 600 MG tablet Take 1 tablet (600 mg total) by mouth 2 (two) times daily. 03/09/19   Duanne Limerick, MD  guaiFENesin-codeine (ROBITUSSIN AC) 100-10 MG/5ML syrup Take 5 mLs by mouth 4 (four) times daily as needed for cough. 05/23/19   Duanne Limerick, MD  hydrochlorothiazide (HYDRODIURIL) 25 MG tablet Take 1 tablet (25 mg total) by mouth daily. 03/09/19   Duanne Limerick, MD  ipratropium (ATROVENT) 0.02 % nebulizer solution Inhale 2.5 mLs into the lungs 4 (four) times daily. 10/18/18 10/18/19  [provider]  levofloxacin (LEVAQUIN) 500 MG tablet Take 1 tablet (500 mg total) by mouth daily. 05/25/19   Duanne Limerick, MD  liraglutide (VICTOZA) 18 MG/3ML SOPN Inject 1.2 mg into the skin daily. hilary blackwood    [provider]  metFORMIN (GLUCOPHAGE) 1000 MG tablet TAKE 1 TABLET BY MOUTH TWICE A DAY Patient taking differently: Take 500 mg by mouth 2 (two) times daily with a meal. Dr Dareen Piano 01/04/19   Duanne Limerick, MD  metoprolol tartrate (LOPRESSOR) 25 MG tablet Take 1 tablet (25 mg total) by mouth 2 (two) times daily. 03/09/19   Duanne Limerick, MD  montelukast (SINGULAIR) 10 MG tablet Take 1 tablet by mouth daily. 04/27/18   [provider]  omeprazole (PRILOSEC) 20 MG capsule Take 20 mg by mouth daily. otc    [provider]  predniSONE (DELTASONE) 10 MG tablet Take 1 tablet (10 mg total) by mouth daily with breakfast. 07/10/18   Duanne Limerick, MD    Allergies  Allergen Reactions  . Betadine [Povidone Iodine] Swelling  . Codeine Itching  . Latex Itching and Swelling    (urinary catheter)  . Meloxicam Swelling  . Penicillins Other (See Comments)    Paralysis  Has patient had a PCN reaction causing immediate rash, facial/tongue/throat swelling, SOB or lightheadedness with hypotension: No Has patient had a PCN reaction causing severe rash involving mucus membranes or skin necrosis: No Has patient  had a PCN reaction that required hospitalization Yes Has patient had a PCN reaction occurring within the last 10 years: No If all of the above answers are "NO", then may proceed with Cephalosporin use.    Family History  Problem Relation Age of Onset  . Heart attack Mother   . Prostate cancer Father   . Brain cancer Father   . Heart attack Maternal Grandmother   . Heart attack Maternal Grandfather   . Breast cancer Paternal Grandmother     Social History Social History   Tobacco Use  . Smoking status: Former Smoker    Packs/day: 1.50    Years: 28.00    Pack years: 42.00    Types: Cigarettes    Quit date: 08/14/2000    Years since quitting: 18.8  . Smokeless tobacco: Never Used  . Tobacco comment: smoking cessatiuon materials not required  Substance Use Topics  . Alcohol use: No  . Drug use: No    Review of Systems Constitutional: Negative for fever. ENT: Negative for recent  illness/congestion Cardiovascular: Negative for chest pain. Respiratory: Negative for shortness of breath.  Occasional cough. Gastrointestinal: Slight abdominal discomfort generalized.  Positive for nausea vomiting and diarrhea. Genitourinary: Negative for urinary compaints Musculoskeletal: Negative for musculoskeletal complaints Skin: Negative for skin complaints  Neurological: Negative for headache All other ROS negative  ____________________________________________   PHYSICAL EXAM:  VITAL SIGNS: ED Triage Vitals  Enc Vitals Group     BP 05/31/19 1131 139/69     Pulse Rate 05/31/19 1131 92     Resp 05/31/19 1131 16     Temp 05/31/19 1131 98.8 F (37.1 C)     Temp Source 05/31/19 1131 Oral     SpO2 05/31/19 1131 95 %     Weight 05/31/19 1133 141 lb (64 kg)     Height 05/31/19 1133 5\' 2"  (1.575 m)     Head Circumference --      Peak Flow --      Pain Score 05/31/19 1133 4     Pain Loc --      Pain Edu? --      Excl. in [place]? --    Constitutional: Alert and oriented. Well appearing  and in no distress. Eyes: Normal exam ENT      Head: Normocephalic and atraumatic.      Mouth/Throat: Mucous membranes are moist. Cardiovascular: Normal rate, regular rhythm.  Respiratory: Normal respiratory effort without tachypnea nor retractions. Breath sounds are clear  Gastrointestinal: Soft and nontender. No distention.   Musculoskeletal: Nontender with normal range of motion in all extremities.  Neurologic:  Normal speech and language. No gross focal neurologic deficits Skin:  Skin is warm, dry and intact.  Psychiatric: Mood and affect are normal.    ____________________________________________   INITIAL IMPRESSION / ASSESSMENT AND PLAN / ED COURSE  Pertinent labs & imaging results that were available during my care of the patient were reviewed by me and considered in my medical decision making (see chart for details).   Patient presents emergency department for nausea vomiting and diarrhea over the past 3 days.  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  Denies any fever does state occasional cough.  Patient feels dehydrated.  We will check labs, swab for coronavirus, IV hydrate, treat Zofran and continue to closely monitor.  Patient agreeable to plan of care.  Patient's corona test is positive.  Patient's labs show mild dehydration or otherwise largely within normal limits.  Patient's room air saturation continues to be in the mid to upper 90s.  Patient is feeling better with fluids and nausea medication.  We will continue to closely monitor in the emergency department.  As long as the patient's nausea is able to be controlled we will discharge home with nausea medication.  Patient agreeable to plan of care.  Patient wishes to go home.  Agreeable to plan of care.  We will discharge with Zofran and a cough medication.  We were having difficulty finding transportation for the  patient, EMS was unwilling, friends and family are unwilling given her COVID positive status.  Taxi unwilling given covered positive status.  We were able to speak to the health department and they are providing transportation for the patient.  She appears very well at this time.  SEDONA WENK was evaluated in Emergency Department on 05/31/2019 for the symptoms described in the history of present illness. She was evaluated  in the context of the global COVID-19 pandemic, which necessitated consideration that the patient might be at risk for infection with the SARS-CoV-2 virus that causes COVID-19. Institutional protocols and algorithms that pertain to the evaluation of patients at risk for COVID-19 are in a state of rapid change based on information released by regulatory bodies including the CDC and federal and state organizations. These policies and algorithms were followed during the patient's care in the ED.  ____________________________________________   FINAL CLINICAL IMPRESSION(S) / ED DIAGNOSES  Vomiting diarrhea COVID-19   Minna AntisPaduchowski, Kenyatte Gruber, MD 05/31/19 1452

## 2019-05-31 NOTE — ED Notes (Signed)
Pt states her symptoms started 8 days ago and dx with bronchitis. The nausea started after she had been on levaquin a few days. Has one pill of levaquin left.

## 2019-05-31 NOTE — ED Notes (Signed)
Pt ambulated to toilet with no assistance with steady gait

## 2019-05-31 NOTE — Discharge Instructions (Addendum)
Person Under Monitoring Name: Judy Bolton  Location: [address] [place] [address]   Infection Prevention Recommendations for Individuals Confirmed to have, or Being Evaluated for, 2019 Novel Coronavirus (COVID-19) Infection Who Receive Care at Home  Individuals who are confirmed to have, or are being evaluated for, COVID-19 should follow the prevention steps below until a healthcare provider or local or state health department says they can return to normal activities.  Stay home except to get medical care You should restrict activities outside your home, except for getting medical care. Do not go to work, school, or public areas, and do not use public transportation or taxis.  Call ahead before visiting your doctor Before your medical appointment, call the healthcare provider and tell them that you have, or are being evaluated for, COVID-19 infection. This will help the healthcare providers office take steps to keep other people from getting infected. Ask your healthcare provider to call the local or state health department.  Monitor your symptoms Seek prompt medical attention if your illness is worsening (e.g., difficulty breathing). Before going to your medical appointment, call the healthcare provider and tell them that you have, or are being evaluated for, COVID-19 infection. Ask your healthcare provider to call the local or state health department.  Wear a facemask You should wear a facemask that covers your nose and mouth when you are in the same room with other people and when you visit a healthcare provider. People who live with or visit you should also wear a facemask while they are in the same room with you.  Separate yourself from other people in your home As much as possible, you should stay in a different room from other people in your home. Also, you should use a separate bathroom, if available.  Avoid sharing household items You  should not share dishes, drinking glasses, cups, eating utensils, towels, bedding, or other items with other people in your home. After using these items, you should wash them thoroughly with soap and water.  Cover your coughs and sneezes Cover your mouth and nose with a tissue when you cough or sneeze, or you can cough or sneeze into your sleeve. Throw used tissues in a lined trash can, and immediately wash your hands with soap and water for at least 20 seconds or use an alcohol-based hand rub.  Wash your [employer] your hands often and thoroughly with soap and water for at least 20 seconds. You can use an alcohol-based hand sanitizer if soap and water are not available and if your hands are not visibly dirty. Avoid touching your eyes, nose, and mouth with unwashed hands.   Prevention Steps for Caregivers and Household Members of Individuals Confirmed to have, or Being Evaluated for, COVID-19 Infection Being Cared for in the Home  If you live with, or provide care at home for, a person confirmed to have, or being evaluated for, COVID-19 infection please follow these guidelines to prevent infection:  Follow healthcare providers instructions Make sure that you understand and can help the patient follow any healthcare provider instructions for all care.  Provide for the patients basic needs You should help the patient with basic needs in the home and provide support for getting groceries, prescriptions, and other personal needs.  Monitor the patients symptoms If they are getting sicker, call his or her medical provider and tell them that the patient has, or is being evaluated for, COVID-19 infection. This will help  the healthcare providers office take steps to keep other people from getting infected. Ask the healthcare provider to call the local or state health department.  Limit the number of people who have contact with the patient If possible, have only one caregiver for the  patient. Other household members should stay in another home or place of residence. If this is not possible, they should stay in another room, or be separated from the patient as much as possible. Use a separate bathroom, if available. Restrict visitors who do not have an essential need to be in the home.  Keep older adults, very young children, and other sick people away from the patient Keep older adults, very young children, and those who have compromised immune systems or chronic health conditions away from the patient. This includes people with chronic heart, lung, or kidney conditions, diabetes, and cancer.  Ensure good ventilation Make sure that shared spaces in the home have good air flow, such as from an air conditioner or an opened window, weather permitting.  Wash your hands often Wash your hands often and thoroughly with soap and water for at least 20 seconds. You can use an alcohol based hand sanitizer if soap and water are not available and if your hands are not visibly dirty. Avoid touching your eyes, nose, and mouth with unwashed hands. Use disposable paper towels to dry your hands. If not available, use dedicated cloth towels and replace them when they become wet.  Wear a facemask and gloves Wear a disposable facemask at all times in the room and gloves when you touch or have contact with the patients blood, body fluids, and/or secretions or excretions, such as sweat, saliva, sputum, nasal mucus, vomit, urine, or feces.  Ensure the mask fits over your nose and mouth tightly, and do not touch it during use. Throw out disposable facemasks and gloves after using them. Do not reuse. Wash your hands immediately after removing your facemask and gloves. If your personal clothing becomes contaminated, carefully remove clothing and launder. Wash your hands after handling contaminated clothing. Place all used disposable facemasks, gloves, and other waste in a lined container before  disposing them with other household waste. Remove gloves and wash your hands immediately after handling these items.  Do not share dishes, glasses, or other household items with the patient Avoid sharing household items. You should not share dishes, drinking glasses, cups, eating utensils, towels, bedding, or other items with a patient who is confirmed to have, or being evaluated for, COVID-19 infection. After the person uses these items, you should wash them thoroughly with soap and water.  Wash laundry thoroughly Immediately remove and wash clothes or bedding that have blood, body fluids, and/or secretions or excretions, such as sweat, saliva, sputum, nasal mucus, vomit, urine, or feces, on them. Wear gloves when handling laundry from the patient. Read and follow directions on labels of laundry or clothing items and detergent. In general, wash and dry with the warmest temperatures recommended on the label.  Clean all areas the individual has used often Clean all touchable surfaces, such as counters, tabletops, doorknobs, bathroom fixtures, toilets, phones, keyboards, tablets, and bedside tables, every day. Also, clean any surfaces that may have blood, body fluids, and/or secretions or excretions on them. Wear gloves when cleaning surfaces the patient has come in contact with. Use a diluted bleach solution (e.g., dilute bleach with 1 part bleach and 10 parts water) or a household disinfectant with a label that says EPA-registered for coronaviruses.  To make a bleach solution at home, add 1 tablespoon of bleach to 1 quart (4 cups) of water. For a larger supply, add  cup of bleach to 1 gallon (16 cups) of water. Read labels of cleaning products and follow recommendations provided on product labels. Labels contain instructions for safe and effective use of the cleaning product including precautions you should take when applying the product, such as wearing gloves or eye protection and making sure you  have good ventilation during use of the product. Remove gloves and wash hands immediately after cleaning.  Monitor yourself for signs and symptoms of illness Caregivers and household members are considered close contacts, should monitor their health, and will be asked to limit movement outside of the home to the extent possible. Follow the monitoring steps for close contacts listed on the symptom monitoring form.   ? If you have additional questions, contact your local health department or call the epidemiologist on call at [phone] (available 24/7). ? This guidance is subject to change. For the most up-to-date guidance from [facility], please refer to their website: [email or web address]

## 2019-05-31 NOTE — ED Notes (Signed)
Pt informed her covid test was positive. States she feels better with nausea medicine and fluids.

## 2019-05-31 NOTE — ED Triage Notes (Signed)
Dx with bronchitis last week and has one day left of levaquin. C/o nausea, vomiting. covid tested on Monday but no results.

## 2019-05-31 NOTE — ED Notes (Addendum)
Health department arrived to take the pt home

## 2019-06-02 LAB — NOVEL CORONAVIRUS, NAA: SARS-CoV-2, NAA: DETECTED — AB

## 2019-06-04 ENCOUNTER — Telehealth: Payer: Self-pay

## 2019-06-04 ENCOUNTER — Other Ambulatory Visit: Payer: Self-pay

## 2019-06-04 DIAGNOSIS — J0100 Acute maxillary sinusitis, unspecified: Secondary | ICD-10-CM

## 2019-06-04 MED ORDER — AZITHROMYCIN 250 MG PO TABS: ORAL_TABLET | ORAL | 0 refills | Status: DC

## 2019-06-04 NOTE — Progress Notes (Unsigned)
Sent in ZPack 

## 2019-06-04 NOTE — Telephone Encounter (Signed)
Antibiotic sent in due to health dept stating needed for low grade fever- send in [place]- sent to [facility]

## 2019-06-18 DIAGNOSIS — R69 Illness, unspecified: Secondary | ICD-10-CM | POA: Diagnosis not present

## 2019-07-03 IMAGING — MG DIGITAL SCREENING BILATERAL MAMMOGRAM WITH TOMO AND CAD
8 series · 8 of 24 positions shown · non-contrast
Comparison: Previous exam(s).

CLINICAL DATA: Screening.

EXAM:
DIGITAL SCREENING BILATERAL MAMMOGRAM WITH TOMO AND CAD

[R CC synth-2D]
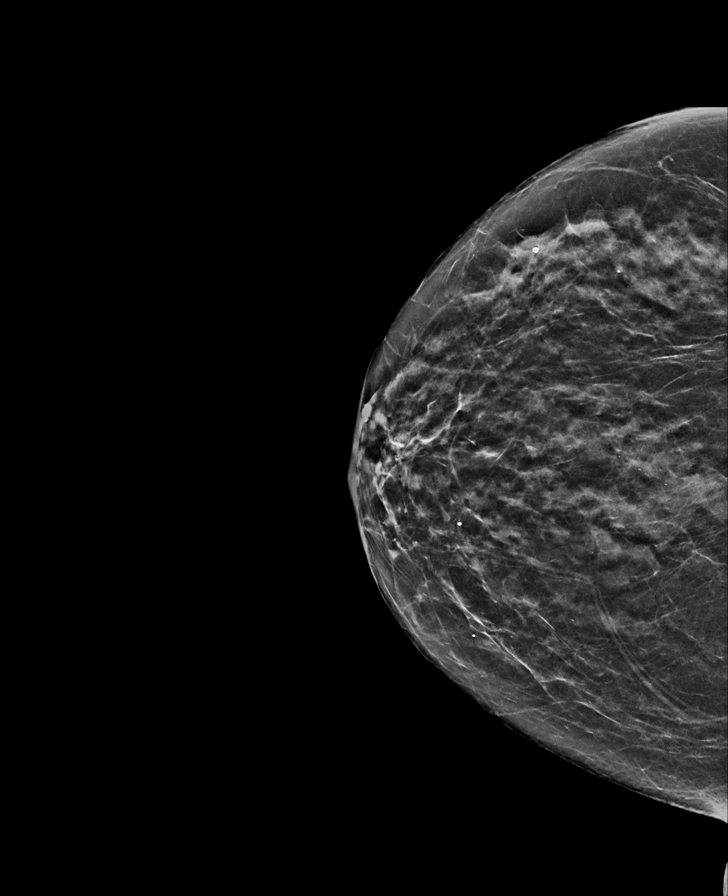

[R MLO synth-2D]
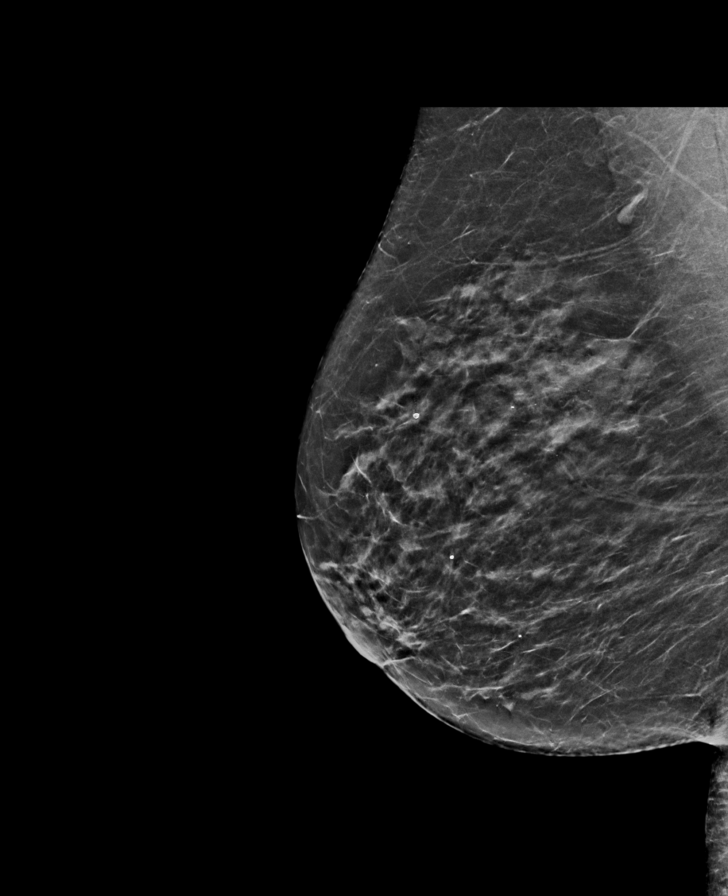

[L MLO synth-2D]
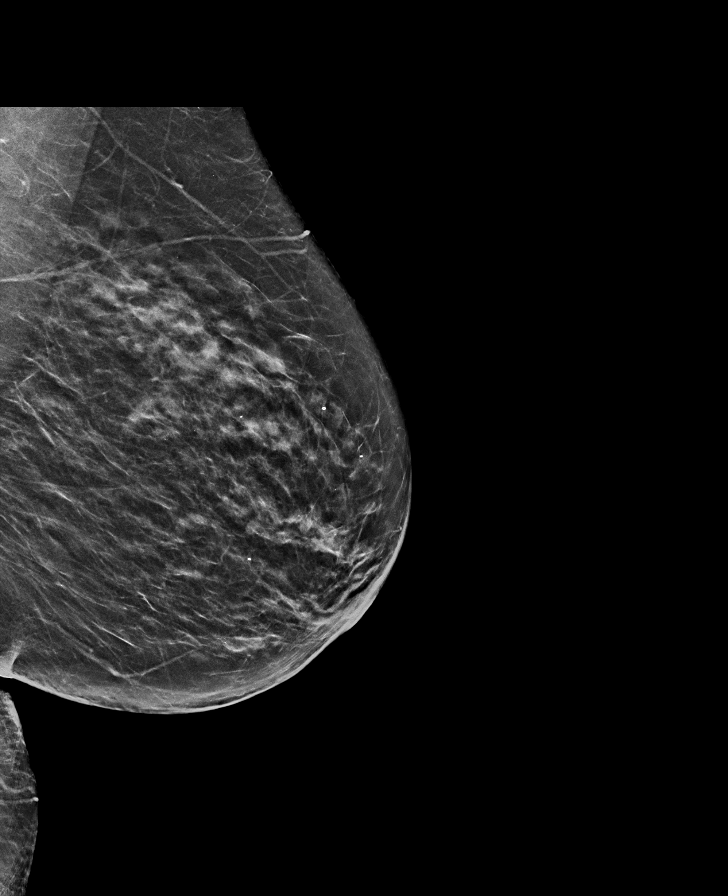

[L CC synth-2D]
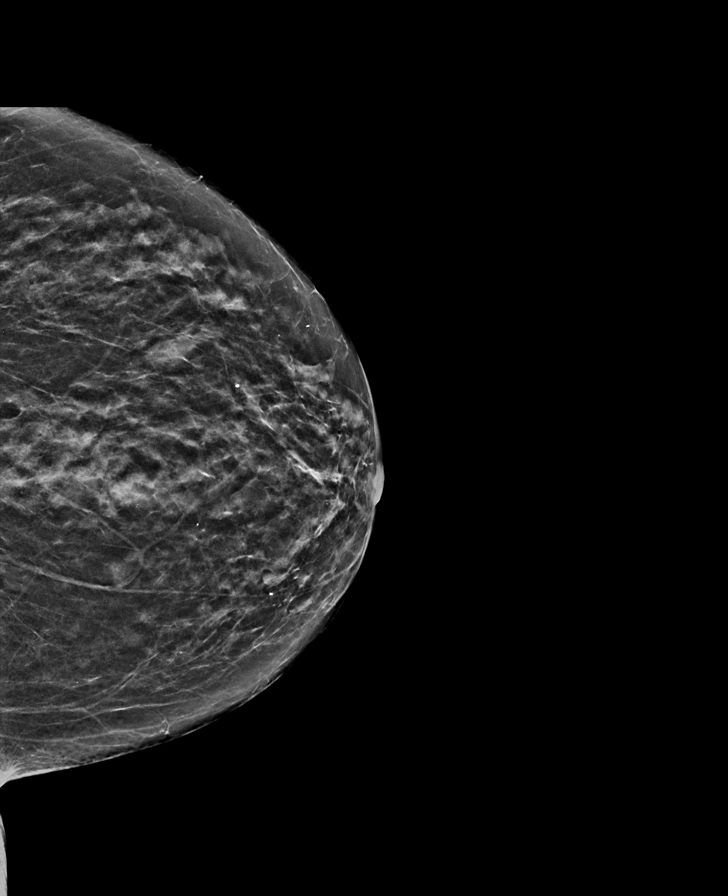

[L CC tomo · tomo slice 25/49.0]
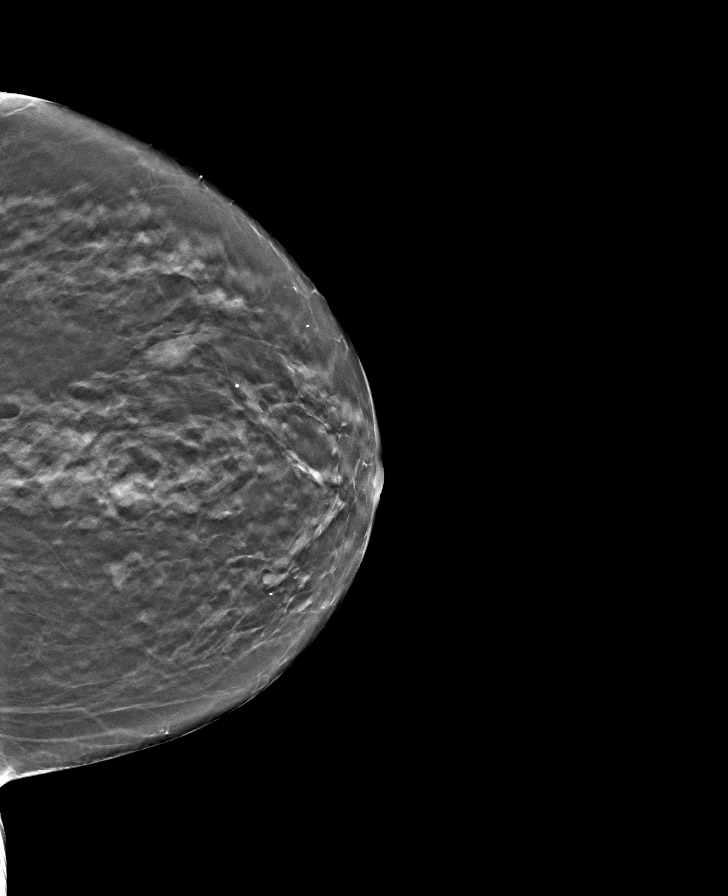

[L MLO tomo · tomo slice 29/57.0]
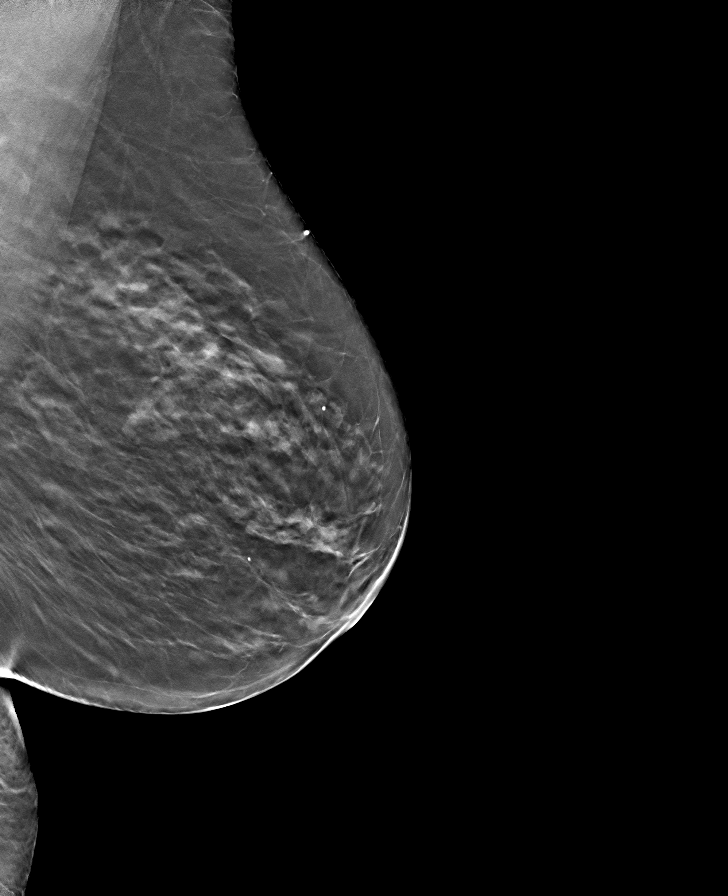

[R MLO tomo · tomo slice 31/61.0]
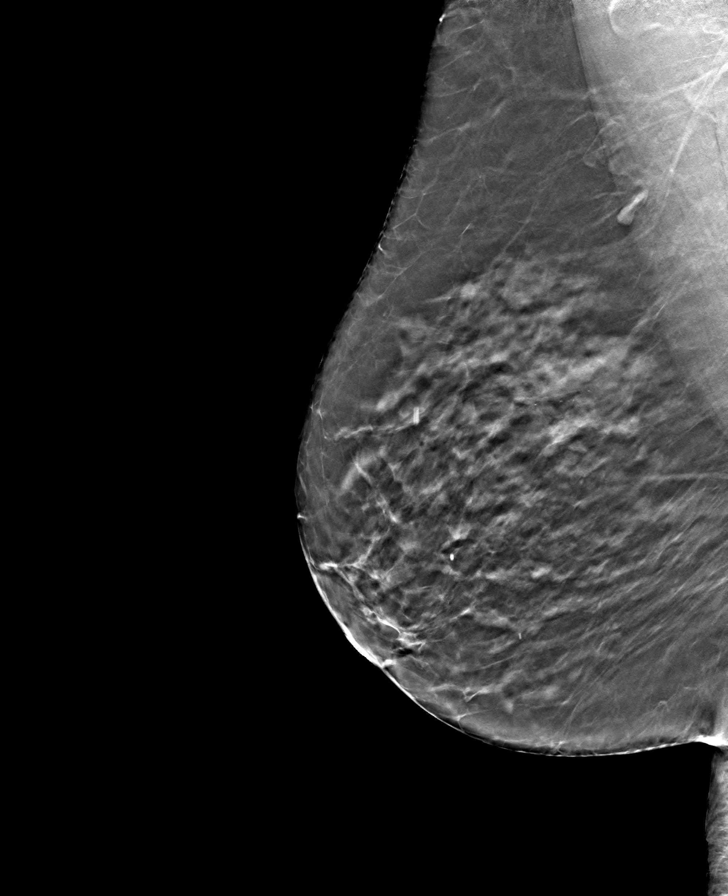

[R CC tomo · tomo slice 26/51.0]
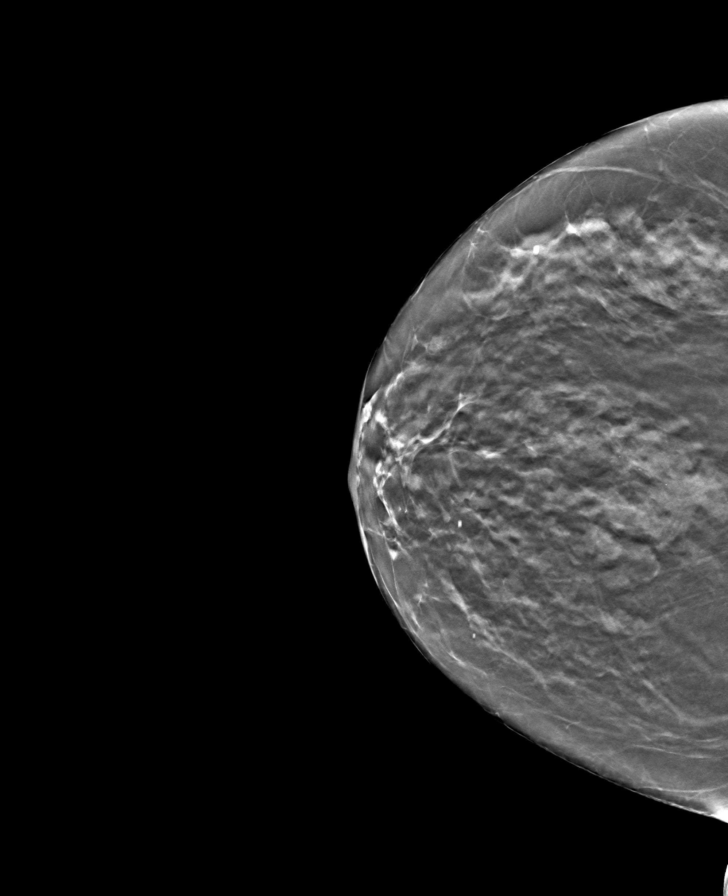

[8 of 24 positions shown; findings below may reference images not displayed]

ACR Breast Density Category c: The breast tissue is heterogeneously
dense, which may obscure small masses.
FINDINGS: There are no findings suspicious for malignancy. Images were
processed with CAD.
IMPRESSION: No mammographic evidence of malignancy. A result letter of this
screening mammogram will be mailed directly to the patient.

RECOMMENDATION:
Screening mammogram in one year. (Code:[id number])

BI-RADS CATEGORY  1: Negative.

## 2019-07-05 DIAGNOSIS — E1129 Type 2 diabetes mellitus with other diabetic kidney complication: Secondary | ICD-10-CM | POA: Diagnosis not present

## 2019-07-05 DIAGNOSIS — E785 Hyperlipidemia, unspecified: Secondary | ICD-10-CM | POA: Diagnosis not present

## 2019-07-05 DIAGNOSIS — R809 Proteinuria, unspecified: Secondary | ICD-10-CM | POA: Diagnosis not present

## 2019-07-05 DIAGNOSIS — E538 Deficiency of other specified B group vitamins: Secondary | ICD-10-CM | POA: Diagnosis not present

## 2019-07-12 DIAGNOSIS — E1129 Type 2 diabetes mellitus with other diabetic kidney complication: Secondary | ICD-10-CM | POA: Diagnosis not present

## 2019-07-12 DIAGNOSIS — R809 Proteinuria, unspecified: Secondary | ICD-10-CM | POA: Diagnosis not present

## 2019-07-12 DIAGNOSIS — E538 Deficiency of other specified B group vitamins: Secondary | ICD-10-CM | POA: Diagnosis not present

## 2019-07-12 DIAGNOSIS — I10 Essential (primary) hypertension: Secondary | ICD-10-CM | POA: Diagnosis not present

## 2019-07-12 DIAGNOSIS — E785 Hyperlipidemia, unspecified: Secondary | ICD-10-CM | POA: Diagnosis not present

## 2019-08-07 ENCOUNTER — Other Ambulatory Visit: Payer: Self-pay | Admitting: Family Medicine

## 2019-08-07 DIAGNOSIS — I10 Essential (primary) hypertension: Secondary | ICD-10-CM

## 2019-08-15 ENCOUNTER — Ambulatory Visit (INDEPENDENT_AMBULATORY_CARE_PROVIDER_SITE_OTHER): Payer: Medicare HMO

## 2019-08-15 ENCOUNTER — Other Ambulatory Visit: Payer: Self-pay

## 2019-08-15 DIAGNOSIS — Z23 Encounter for immunization: Secondary | ICD-10-CM | POA: Diagnosis not present

## 2019-08-24 ENCOUNTER — Other Ambulatory Visit: Payer: Self-pay

## 2019-08-24 ENCOUNTER — Ambulatory Visit (INDEPENDENT_AMBULATORY_CARE_PROVIDER_SITE_OTHER): Payer: Medicare HMO | Admitting: Family Medicine

## 2019-08-24 ENCOUNTER — Encounter: Payer: Self-pay | Admitting: Family Medicine

## 2019-08-24 VITALS — BP 132/82 | HR 72 | Resp 16 | Ht 62.0 in | Wt 138.0 lb

## 2019-08-24 DIAGNOSIS — N342 Other urethritis: Secondary | ICD-10-CM | POA: Diagnosis not present

## 2019-08-24 DIAGNOSIS — B3731 Acute candidiasis of vulva and vagina: Secondary | ICD-10-CM

## 2019-08-24 DIAGNOSIS — B373 Candidiasis of vulva and vagina: Secondary | ICD-10-CM

## 2019-08-24 LAB — POCT URINALYSIS DIPSTICK
Bilirubin, UA: NEGATIVE
Blood, UA: NEGATIVE
Glucose, UA: NEGATIVE
Ketones, UA: NEGATIVE
Leukocytes, UA: NEGATIVE
Nitrite, UA: POSITIVE
Protein, UA: POSITIVE — AB
Spec Grav, UA: 1.01 (ref 1.010–1.025)
Urobilinogen, UA: 0.2 E.U./dL
pH, UA: 5 (ref 5.0–8.0)

## 2019-08-24 MED ORDER — CIPROFLOXACIN HCL 250 MG PO TABS: 250.0000 mg | ORAL_TABLET | Freq: Two times a day (BID) | ORAL | 0 refills | Status: DC

## 2019-08-24 MED ORDER — NYSTATIN 100000 UNIT/GM EX CREA: 1.0000 "application " | TOPICAL_CREAM | Freq: Two times a day (BID) | CUTANEOUS | 0 refills | Status: DC

## 2019-08-24 MED ORDER — FLUCONAZOLE 150 MG PO TABS: 150.0000 mg | ORAL_TABLET | Freq: Once | ORAL | 0 refills | Status: AC

## 2019-08-24 NOTE — Progress Notes (Signed)
Date:  08/24/2019   Name:  Judy Bolton   DOB:  [birth year]   MRN:  [id number]   Chief Complaint: Dysuria (24 hours )  Dysuria  This is a new problem. The current episode started yesterday. The problem occurs every urination. The problem has been gradually worsening. The quality of the pain is described as burning. The pain is mild. Associated symptoms include a discharge, frequency and urgency. Pertinent negatives include no chills, flank pain, hematuria, hesitancy, nausea or sweats.    Review of Systems  Constitutional: Negative for chills and fever.  HENT: Negative for drooling, ear discharge, ear pain, postnasal drip, rhinorrhea and sore throat.   Respiratory: Negative for cough, shortness of breath and wheezing.   Cardiovascular: Negative for chest pain, palpitations and leg swelling.  Gastrointestinal: Negative for abdominal pain, blood in stool, constipation, diarrhea and nausea.  Endocrine: Negative for polydipsia.  Genitourinary: Positive for dysuria, frequency, urgency and vaginal discharge. Negative for flank pain, hematuria and hesitancy.  Musculoskeletal: Negative for back pain, myalgias and neck pain.  Skin: Negative for rash.  Allergic/Immunologic: Negative for environmental allergies.  Neurological: Negative for dizziness and headaches.  Hematological: Does not bruise/bleed easily.  Psychiatric/Behavioral: Negative for suicidal ideas. The patient is not nervous/anxious.     Patient Active Problem List   Diagnosis Date Noted  . Essential hypertension 08/30/2017  . Gastroesophageal reflux disease 08/30/2017  . Aortic atherosclerosis ([place]) 05/31/2017  . PAD (peripheral artery disease) ([place]) 05/31/2017  . Encounter for long-term (current) use of high-risk medication 12/28/2016  . Bilateral hand pain 11/08/2016  . Elevated C-reactive protein 11/08/2016  . Swelling of joint of right hand 11/08/2016  . Type 2 diabetes mellitus with microalbuminuria, without  long-term current use of insulin ([place]) 07/06/2016  . Acute anxiety 04/16/2016  . Bronchitis 04/16/2016  . Centrilobular emphysema ([place]) 04/16/2016  . TIA (transient ischemic attack) 02/29/2016  . Lumbar stenosis with neurogenic claudication 04/16/2015  . Spondylosis of lumbar region without myelopathy or radiculopathy 04/16/2015  . Hypertriglyceridemia 10/15/2014    Allergies  Allergen Reactions  . Betadine [Povidone Iodine] Swelling  . Codeine Itching  . Latex Itching and Swelling    (urinary catheter)  . Meloxicam Swelling  . Penicillins Other (See Comments)    Paralysis  Has patient had a PCN reaction causing immediate rash, facial/tongue/throat swelling, SOB or lightheadedness with hypotension: No Has patient had a PCN reaction causing severe rash involving mucus membranes or skin necrosis: No Has patient had a PCN reaction that required hospitalization Yes Has patient had a PCN reaction occurring within the last 10 years: No If all of the above answers are "NO", then may proceed with Cephalosporin use.    Past Surgical History:  Procedure Laterality Date  . ABDOMINAL HYSTERECTOMY    . ANTERIOR (CYSTOCELE) AND POSTERIOR REPAIR (RECTOCELE) WITH XENFORM GRAFT AND SACROSPINOUS FIXATION    . APPENDECTOMY    . BROW LIFT Bilateral 09/05/2018   Procedure: BLEPHAROPLASTY UPPER EYELID W/EXCESS SKIN;  Surgeon: Karle Starch, MD;  Location: [facility];  Service: Ophthalmology;  Laterality: Bilateral;  . CTR Bilateral   . LUMBAR LAMINECTOMY/DECOMPRESSION MICRODISCECTOMY N/A 07/27/2017   Procedure: LUMBAR LAMINECTOMY/DECOMPRESSION MICRODISCECTOMY 3 LEVELS-L3-S1;  Surgeon: Meade Maw, MD;  Location: [facility];  Service: Neurosurgery;  Laterality: N/A;  . PTOSIS REPAIR Bilateral 09/05/2018   Procedure: PTOSIS REPAIR RESECT EX;  Surgeon: Karle Starch, MD;  Location: [facility];  Service: Ophthalmology;  Laterality: Bilateral;  Diabetic - oral meds  sleep apnea Latexd  allergy  . ROTATOR CUFF REPAIR Right     Social History   Tobacco Use  . Smoking status: Former Smoker    Packs/day: 1.50    Years: 28.00    Pack years: 42.00    Types: Cigarettes    Quit date: 08/14/2000    Years since quitting: 19.0  . Smokeless tobacco: Never Used  . Tobacco comment: smoking cessatiuon materials not required  Substance Use Topics  . Alcohol use: No  . Drug use: No     Medication list has been reviewed and updated.  Current Meds  Medication Sig  . albuterol (PROVENTIL HFA;VENTOLIN HFA) 108 (90 Base) MCG/ACT inhaler Inhale 2 puffs into the lungs every 6 (six) hours as needed for wheezing or shortness of breath.  Marland Kitchen albuterol (PROVENTIL) (2.5 MG/3ML) 0.083% nebulizer solution Take 3 mLs (2.5 mg total) by nebulization every 6 (six) hours as needed for wheezing or shortness of breath.  Marland Kitchen aspirin EC 81 MG tablet Take by mouth.  Marland Kitchen atorvastatin (LIPITOR) 40 MG tablet Take 1 tablet (40 mg total) by mouth daily.  Marland Kitchen etodolac (LODINE XL) 500 MG 24 hr tablet TAKE 1 TABLET (500 MG TOTAL) DAILY BY MOUTH. (Patient taking differently: Take 500 mg by mouth daily. PRN)  . gemfibrozil (LOPID) 600 MG tablet Take 1 tablet (600 mg total) by mouth 2 (two) times daily.  . hydrochlorothiazide (HYDRODIURIL) 25 MG tablet TAKE 1 TABLET BY MOUTH EVERY DAY  . ipratropium (ATROVENT) 0.02 % nebulizer solution Inhale 2.5 mLs into the lungs 4 (four) times daily.  Marland Kitchen liraglutide (VICTOZA) 18 MG/3ML SOPN Inject 1.2 mg into the skin daily. hilary blackwood  . metFORMIN (GLUCOPHAGE) 1000 MG tablet TAKE 1 TABLET BY MOUTH TWICE A DAY (Patient taking differently: Take 500 mg by mouth 2 (two) times daily with a meal. Dr Dareen Piano)  . metoprolol tartrate (LOPRESSOR) 25 MG tablet Take 1 tablet (25 mg total) by mouth 2 (two) times daily.  . montelukast (SINGULAIR) 10 MG tablet Take 1 tablet by mouth daily.  Marland Kitchen omeprazole (PRILOSEC) 20 MG capsule Take 20 mg by mouth daily. otc  . ondansetron (ZOFRAN ODT) 4 MG  disintegrating tablet Take 1 tablet (4 mg total) by mouth every 8 (eight) hours as needed for nausea or vomiting.  . predniSONE (DELTASONE) 10 MG tablet Take 1 tablet (10 mg total) by mouth daily with breakfast.  . [DISCONTINUED] azithromycin (ZITHROMAX) 250 MG tablet Use as directed    PHQ 2/9 Scores 03/09/2019 01/31/2019 05/09/2018 01/23/2018  PHQ - 2 Score 0 0 0 0  PHQ- 9 Score 0 - 0 0    BP Readings from Last 3 Encounters:  08/24/19 132/82  05/31/19 (!) 158/76  05/23/19 120/70    Physical Exam Vitals signs and nursing note reviewed.  Constitutional:      Appearance: She is well-developed.  HENT:     Head: Normocephalic.     Right Ear: External ear normal.     Left Ear: External ear normal.  Eyes:     General: Lids are everted, no foreign bodies appreciated. No scleral icterus.       Left eye: No foreign body or hordeolum.     Conjunctiva/sclera: Conjunctivae normal.     Right eye: Right conjunctiva is not injected.     Left eye: Left conjunctiva is not injected.     Pupils: Pupils are equal, round, and reactive to light.  Neck:     Musculoskeletal: Normal range of motion  and neck supple.     Thyroid: No thyromegaly.     Vascular: No JVD.     Trachea: No tracheal deviation.  Cardiovascular:     Rate and Rhythm: Normal rate and regular rhythm.     Heart sounds: Normal heart sounds. No murmur. No friction rub. No gallop.   Pulmonary:     Effort: Pulmonary effort is normal. No respiratory distress.     Breath sounds: Normal breath sounds. No wheezing or rales.  Abdominal:     General: Bowel sounds are normal.     Palpations: Abdomen is soft. There is no mass.     Tenderness: There is abdominal tenderness in the suprapubic area. There is no right CVA tenderness, left CVA tenderness, guarding or rebound.  Musculoskeletal: Normal range of motion.        General: No tenderness.  Lymphadenopathy:     Cervical: No cervical adenopathy.  Skin:    General: Skin is warm.      Findings: No rash.  Neurological:     Mental Status: She is alert and oriented to person, place, and time.     Cranial Nerves: No cranial nerve deficit.     Deep Tendon Reflexes: Reflexes normal.  Psychiatric:        Mood and Affect: Mood is not anxious or depressed.     Wt Readings from Last 3 Encounters:  08/24/19 138 lb (62.6 kg)  05/31/19 141 lb (64 kg)  05/23/19 141 lb (64 kg)    BP 132/82   Pulse 72   Resp 16   Ht 5\' 2"  (1.575 m)   Wt 138 lb (62.6 kg)   BMI 25.24 kg/m   Assessment and Plan:  1. Urethritis Patient has symptoms suggestive of urethritis.  She is going to the beach.  Exam and history suggestive of UTI and we will initiate Cipro 250 mg twice a day for 3 days. - ciprofloxacin (CIPRO) 250 MG tablet; Take 1 tablet (250 mg total) by mouth 2 (two) times daily.  Dispense: 6 tablet; Refill: 0 - POCT urinalysis dipstick  2. Vulvovaginal candidiasis Patient also may have a bit of a urethritis secondary to candidiasis infiltration will initiate Diflucan 150 once a day and given nystatin cream to apply to urethral area as needed. - fluconazole (DIFLUCAN) 150 MG tablet; Take 1 tablet (150 mg total) by mouth once for 1 dose.  Dispense: 1 tablet; Refill: 0 - nystatin cream (MYCOSTATIN); Apply 1 application topically 2 (two) times daily.  Dispense: 30 g; Refill: 0

## 2019-10-01 ENCOUNTER — Other Ambulatory Visit: Payer: Self-pay

## 2019-10-01 ENCOUNTER — Other Ambulatory Visit: Payer: Self-pay | Admitting: Family Medicine

## 2019-10-02 ENCOUNTER — Other Ambulatory Visit: Payer: Self-pay | Admitting: Family Medicine

## 2019-10-03 ENCOUNTER — Telehealth: Payer: Self-pay

## 2019-10-03 ENCOUNTER — Other Ambulatory Visit: Payer: Medicare HMO

## 2019-10-03 ENCOUNTER — Other Ambulatory Visit: Payer: Self-pay

## 2019-10-03 ENCOUNTER — Other Ambulatory Visit
Admission: RE | Admit: 2019-10-03 | Discharge: 2019-10-03 | Disposition: A | Discharge status: Home / Self Care | Payer: Medicare HMO | Attending: Family Medicine | Admitting: Family Medicine

## 2019-10-03 DIAGNOSIS — R7989 Other specified abnormal findings of blood chemistry: Secondary | ICD-10-CM

## 2019-10-03 LAB — CREATININE, SERUM
Creatinine, Ser: 1.22 mg/dL — ABNORMAL HIGH (ref 0.44–1.00)
GFR calc Af Amer: 52 mL/min — ABNORMAL LOW (ref >60)
GFR calc non Af Amer: 45 mL/min — ABNORMAL LOW (ref >60)

## 2019-10-03 MED ORDER — ETODOLAC 200 MG PO CAPS: 200.0000 mg | ORAL_CAPSULE | Freq: Two times a day (BID) | ORAL | 0 refills | Status: DC

## 2019-10-03 NOTE — Telephone Encounter (Signed)
Pt wants etodolac refilled- she has elevated creat- will come in today for a recheck. Take Tylenol in the meantime.

## 2019-10-05 ENCOUNTER — Other Ambulatory Visit: Payer: Self-pay

## 2019-10-10 ENCOUNTER — Ambulatory Visit
Admission: RE | Admit: 2019-10-10 | Discharge: 2019-10-10 | Disposition: A | Discharge status: Home / Self Care | Payer: Medicare HMO | Source: Ambulatory Visit | Attending: Family Medicine | Admitting: Family Medicine

## 2019-10-10 ENCOUNTER — Other Ambulatory Visit: Payer: Self-pay

## 2019-10-10 ENCOUNTER — Ambulatory Visit
Admission: RE | Admit: 2019-10-10 | Discharge: 2019-10-10 | Disposition: A | Discharge status: Home / Self Care | Payer: Medicare HMO | Attending: Family Medicine | Admitting: Family Medicine

## 2019-10-10 ENCOUNTER — Encounter: Payer: Self-pay | Admitting: Family Medicine

## 2019-10-10 ENCOUNTER — Ambulatory Visit (INDEPENDENT_AMBULATORY_CARE_PROVIDER_SITE_OTHER): Payer: Medicare HMO | Admitting: Family Medicine

## 2019-10-10 VITALS — BP 124/78 | HR 85 | Resp 16 | Ht 62.0 in | Wt 147.0 lb

## 2019-10-10 DIAGNOSIS — M1611 Unilateral primary osteoarthritis, right hip: Secondary | ICD-10-CM | POA: Diagnosis not present

## 2019-10-10 DIAGNOSIS — M47816 Spondylosis without myelopathy or radiculopathy, lumbar region: Secondary | ICD-10-CM

## 2019-10-10 DIAGNOSIS — M25551 Pain in right hip: Secondary | ICD-10-CM

## 2019-10-10 DIAGNOSIS — R809 Proteinuria, unspecified: Secondary | ICD-10-CM | POA: Diagnosis not present

## 2019-10-10 DIAGNOSIS — E1129 Type 2 diabetes mellitus with other diabetic kidney complication: Secondary | ICD-10-CM | POA: Diagnosis not present

## 2019-10-10 LAB — HEMOGLOBIN A1C: Hemoglobin A1C: 8

## 2019-10-10 MED ORDER — PREDNISONE 10 MG PO TABS: ORAL_TABLET | ORAL | 1 refills | Status: DC

## 2019-10-10 MED ORDER — CYCLOBENZAPRINE HCL 10 MG PO TABS: 10.0000 mg | ORAL_TABLET | Freq: Three times a day (TID) | ORAL | 0 refills | Status: DC | PRN

## 2019-10-10 NOTE — Progress Notes (Signed)
Date:  10/10/2019   Name:  Judy Bolton   DOB:  1946-03-05   MRN:  [id number]   Chief Complaint: Hip Pain (R Hip Pain 2 weeks )  Hip Pain  The incident occurred 5 to 7 days ago (2 week). There was no injury mechanism. The pain is present in the right hip, right thigh and right leg. The quality of the pain is described as shooting. The pain is at a severity of 10/10. The pain is moderate. Associated symptoms include numbness and tingling. Pertinent negatives include no inability to bear weight, loss of motion, loss of sensation or muscle weakness. The symptoms are aggravated by weight bearing. She has tried NSAIDs and acetaminophen for the symptoms. The treatment provided mild relief.  Back Pain This is a new problem. The current episode started 1 to 4 weeks ago (2 weeks). The problem occurs constantly. The pain is present in the gluteal and lumbar spine. The quality of the pain is described as shooting and aching. The pain radiates to the right foot, right knee and right thigh. The pain is severe. The pain is the same all the time. The symptoms are aggravated by bending, twisting and standing. Associated symptoms include numbness and tingling. Pertinent negatives include no abdominal pain, bladder incontinence, bowel incontinence, chest pain, dysuria, fever or headaches. Risk factors: hx DDD lumbar. She has tried NSAIDs for the symptoms. The treatment provided mild relief.    Review of Systems  Constitutional: Negative for chills and fever.  HENT: Negative for drooling, ear discharge, ear pain and sore throat.   Respiratory: Negative for cough, shortness of breath and wheezing.   Cardiovascular: Negative for chest pain, palpitations and leg swelling.  Gastrointestinal: Negative for abdominal pain, blood in stool, bowel incontinence, constipation, diarrhea and nausea.  Endocrine: Negative for polydipsia.  Genitourinary: Negative for bladder incontinence, dysuria, frequency, hematuria and  urgency.  Musculoskeletal: Positive for back pain. Negative for myalgias and neck pain.  Skin: Negative for rash.  Allergic/Immunologic: Negative for environmental allergies.  Neurological: Positive for tingling and numbness. Negative for dizziness and headaches.  Hematological: Does not bruise/bleed easily.  Psychiatric/Behavioral: Negative for suicidal ideas. The patient is not nervous/anxious.     Patient Active Problem List   Diagnosis Date Noted  . Essential hypertension 08/30/2017  . Gastroesophageal reflux disease 08/30/2017  . Aortic atherosclerosis (HCC) 05/31/2017  . PAD (peripheral artery disease) (HCC) 05/31/2017  . Encounter for long-term (current) use of high-risk medication 12/28/2016  . Bilateral hand pain 11/08/2016  . Elevated C-reactive protein 11/08/2016  . Swelling of joint of right hand 11/08/2016  . Type 2 diabetes mellitus with microalbuminuria, without long-term current use of insulin (HCC) 07/06/2016  . Acute anxiety 04/16/2016  . Bronchitis 04/16/2016  . Centrilobular emphysema (HCC) 04/16/2016  . TIA (transient ischemic attack) 02/29/2016  . Lumbar stenosis with neurogenic claudication 04/16/2015  . Spondylosis of lumbar region without myelopathy or radiculopathy 04/16/2015  . Hypertriglyceridemia 10/15/2014    Allergies  Allergen Reactions  . Betadine [Povidone Iodine] Swelling  . Codeine Itching  . Latex Itching and Swelling    (urinary catheter)  . Meloxicam Swelling  . Penicillins Other (See Comments)    Paralysis  Has patient had a PCN reaction causing immediate rash, facial/tongue/throat swelling, SOB or lightheadedness with hypotension: No Has patient had a PCN reaction causing severe rash involving mucus membranes or skin necrosis: No Has patient had a PCN reaction that required hospitalization Yes Has patient had a PCN  reaction occurring within the last 10 years: No If all of the above answers are "NO", then may proceed with Cephalosporin  use.    Past Surgical History:  Procedure Laterality Date  . ABDOMINAL HYSTERECTOMY    . ANTERIOR (CYSTOCELE) AND POSTERIOR REPAIR (RECTOCELE) WITH XENFORM GRAFT AND SACROSPINOUS FIXATION    . APPENDECTOMY    . BROW LIFT Bilateral 09/05/2018   Procedure: BLEPHAROPLASTY UPPER EYELID W/EXCESS SKIN;  Surgeon: Imagene Riches, MD;  Location: [facility];  Service: Ophthalmology;  Laterality: Bilateral;  . CTR Bilateral   . LUMBAR LAMINECTOMY/DECOMPRESSION MICRODISCECTOMY N/A 07/27/2017   Procedure: LUMBAR LAMINECTOMY/DECOMPRESSION MICRODISCECTOMY 3 LEVELS-L3-S1;  Surgeon: Venetia Night, MD;  Location: [facility];  Service: Neurosurgery;  Laterality: N/A;  . PTOSIS REPAIR Bilateral 09/05/2018   Procedure: PTOSIS REPAIR RESECT EX;  Surgeon: Imagene Riches, MD;  Location: [facility];  Service: Ophthalmology;  Laterality: Bilateral;  Diabetic - oral meds sleep apnea Latexd allergy  . ROTATOR CUFF REPAIR Right     Social History   Tobacco Use  . Smoking status: Former Smoker    Packs/day: 1.50    Years: 28.00    Pack years: 42.00    Types: Cigarettes    Quit date: 08/14/2000    Years since quitting: 19.1  . Smokeless tobacco: Never Used  . Tobacco comment: smoking cessatiuon materials not required  Substance Use Topics  . Alcohol use: No  . Drug use: No     Medication list has been reviewed and updated.  Current Meds  Medication Sig  . albuterol (PROVENTIL HFA;VENTOLIN HFA) 108 (90 Base) MCG/ACT inhaler Inhale 2 puffs into the lungs every 6 (six) hours as needed for wheezing or shortness of breath.  Marland Kitchen albuterol (PROVENTIL) (2.5 MG/3ML) 0.083% nebulizer solution Take 3 mLs (2.5 mg total) by nebulization every 6 (six) hours as needed for wheezing or shortness of breath.  Marland Kitchen aspirin EC 81 MG tablet Take by mouth.  Marland Kitchen atorvastatin (LIPITOR) 40 MG tablet Take 1 tablet (40 mg total) by mouth daily.  Marland Kitchen gemfibrozil (LOPID) 600 MG tablet Take 1 tablet (600 mg total) by mouth 2  (two) times daily.  . hydrochlorothiazide (HYDRODIURIL) 25 MG tablet TAKE 1 TABLET BY MOUTH EVERY DAY  . ipratropium (ATROVENT) 0.02 % nebulizer solution Inhale 2.5 mLs into the lungs 4 (four) times daily.  Marland Kitchen liraglutide (VICTOZA) 18 MG/3ML SOPN Inject 1.2 mg into the skin daily. hilary blackwood  . metFORMIN (GLUCOPHAGE) 1000 MG tablet TAKE 1 TABLET BY MOUTH TWICE A DAY (Patient taking differently: Take 500 mg by mouth 2 (two) times daily with a meal. Dr Dareen Piano)  . metoprolol tartrate (LOPRESSOR) 25 MG tablet Take 1 tablet (25 mg total) by mouth 2 (two) times daily.  . montelukast (SINGULAIR) 10 MG tablet Take 1 tablet by mouth daily.  Marland Kitchen nystatin cream (MYCOSTATIN) Apply 1 application topically 2 (two) times daily.  Marland Kitchen omeprazole (PRILOSEC) 20 MG capsule Take 20 mg by mouth daily. otc  . predniSONE (DELTASONE) 10 MG tablet Take 1 tablet (10 mg total) by mouth daily with breakfast.  . [DISCONTINUED] ciprofloxacin (CIPRO) 250 MG tablet Take 1 tablet (250 mg total) by mouth 2 (two) times daily.  . [DISCONTINUED] etodolac (LODINE) 200 MG capsule Take 1 capsule (200 mg total) by mouth 2 (two) times daily.  . [DISCONTINUED] ondansetron (ZOFRAN ODT) 4 MG disintegrating tablet Take 1 tablet (4 mg total) by mouth every 8 (eight) hours as needed for nausea or vomiting.  PHQ 2/9 Scores 03/09/2019 01/31/2019 05/09/2018 01/23/2018  PHQ - 2 Score 0 0 0 0  PHQ- 9 Score 0 - 0 0    BP Readings from Last 3 Encounters:  10/10/19 124/78  08/24/19 132/82  05/31/19 (!) 158/76    Physical Exam Vitals signs and nursing note reviewed.  Constitutional:      Appearance: She is well-developed.  HENT:     Head: Normocephalic.     Right Ear: External ear normal.     Left Ear: External ear normal.  Eyes:     General: Lids are everted, no foreign bodies appreciated. No scleral icterus.       Left eye: No foreign body or hordeolum.     Conjunctiva/sclera: Conjunctivae normal.     Right eye: Right conjunctiva is  not injected.     Left eye: Left conjunctiva is not injected.     Pupils: Pupils are equal, round, and reactive to light.  Neck:     Musculoskeletal: Normal range of motion and neck supple.     Thyroid: No thyromegaly.     Vascular: No JVD.     Trachea: No tracheal deviation.  Cardiovascular:     Rate and Rhythm: Normal rate and regular rhythm.     Heart sounds: Normal heart sounds. No murmur. No friction rub. No gallop.   Pulmonary:     Effort: Pulmonary effort is normal. No respiratory distress.     Breath sounds: Normal breath sounds. No wheezing or rales.  Abdominal:     General: Bowel sounds are normal.     Palpations: Abdomen is soft. There is no mass.     Tenderness: There is no abdominal tenderness. There is no guarding or rebound.  Musculoskeletal: Normal range of motion.        General: No tenderness.     Lumbar back: She exhibits pain and spasm. She exhibits normal range of motion, no tenderness, no bony tenderness and no deformity.       Back:  Lymphadenopathy:     Cervical: No cervical adenopathy.  Skin:    General: Skin is warm.     Findings: No rash.  Neurological:     Mental Status: She is alert and oriented to person, place, and time.     Cranial Nerves: No cranial nerve deficit.     Deep Tendon Reflexes: Reflexes normal.  Psychiatric:        Mood and Affect: Mood is not anxious or depressed.     Wt Readings from Last 3 Encounters:  10/10/19 147 lb (66.7 kg)  08/24/19 138 lb (62.6 kg)  05/31/19 141 lb (64 kg)    BP 124/78   Pulse 85   Resp 16   Ht 5\' 2"  (1.575 m)   Wt 147 lb (66.7 kg)   SpO2 98%   BMI 26.89 kg/m   Assessment and Plan:  1. Hip pain, acute, right Patient has developed a 2-week history of acute CABG severe pain in her right hip area.  Is particularly exacerbated by standing up and weightbearing.  There is no palpable tenderness nor palpable defect there is reasonable rotation of the hip without pain we will x-ray this to make sure  that there has not been any possible avulsion or necrosis circumstance.  Patient's been encouraged to continue her etodolac at the reduced dosing since she has a history of GI bleed.  We have added cyclobenzaprine for some muscle relaxation.  Patient is unable to take controlled pain  control medications.  We will refer to orthopedics for evaluation I suspect there is a radicular pain circumstance given that the patient has had a history of surgery referral lumbar degenerative disease. - DG Hip Unilat W OR W/O Pelvis 2-3 Views Right; Future - Ambulatory referral to Orthopedic Surgery  2. Spondylosis of lumbar region without myelopathy or radiculopathy Patient has a history of spondylosis of the lumbar area and will x-ray the hip to begin with and if necessary we will further evaluate the lower lumbar area in an orthopedic setting.

## 2019-10-17 DIAGNOSIS — E1129 Type 2 diabetes mellitus with other diabetic kidney complication: Secondary | ICD-10-CM | POA: Diagnosis not present

## 2019-10-17 DIAGNOSIS — Z9889 Other specified postprocedural states: Secondary | ICD-10-CM | POA: Diagnosis not present

## 2019-10-17 DIAGNOSIS — I10 Essential (primary) hypertension: Secondary | ICD-10-CM | POA: Diagnosis not present

## 2019-10-17 DIAGNOSIS — E785 Hyperlipidemia, unspecified: Secondary | ICD-10-CM | POA: Diagnosis not present

## 2019-10-17 DIAGNOSIS — M5441 Lumbago with sciatica, right side: Secondary | ICD-10-CM | POA: Diagnosis not present

## 2019-10-17 DIAGNOSIS — R809 Proteinuria, unspecified: Secondary | ICD-10-CM | POA: Diagnosis not present

## 2019-10-17 DIAGNOSIS — M545 Low back pain: Secondary | ICD-10-CM | POA: Diagnosis not present

## 2019-10-18 ENCOUNTER — Other Ambulatory Visit: Payer: Self-pay | Admitting: Orthopedic Surgery

## 2019-10-18 DIAGNOSIS — M545 Low back pain, unspecified: Secondary | ICD-10-CM

## 2019-10-18 DIAGNOSIS — M5441 Lumbago with sciatica, right side: Secondary | ICD-10-CM

## 2019-10-18 DIAGNOSIS — Z9889 Other specified postprocedural states: Secondary | ICD-10-CM

## 2019-10-31 ENCOUNTER — Ambulatory Visit
Admission: RE | Admit: 2019-10-31 | Discharge: 2019-10-31 | Disposition: A | Discharge status: Home / Self Care | Payer: Medicare HMO | Source: Ambulatory Visit | Attending: Orthopedic Surgery | Admitting: Orthopedic Surgery

## 2019-10-31 ENCOUNTER — Other Ambulatory Visit: Payer: Self-pay

## 2019-10-31 DIAGNOSIS — Z9889 Other specified postprocedural states: Secondary | ICD-10-CM | POA: Insufficient documentation

## 2019-10-31 DIAGNOSIS — M5441 Lumbago with sciatica, right side: Secondary | ICD-10-CM | POA: Diagnosis not present

## 2019-10-31 DIAGNOSIS — M545 Low back pain, unspecified: Secondary | ICD-10-CM

## 2019-11-20 ENCOUNTER — Other Ambulatory Visit: Payer: Self-pay | Admitting: Family Medicine

## 2019-11-20 DIAGNOSIS — E785 Hyperlipidemia, unspecified: Secondary | ICD-10-CM

## 2019-11-22 ENCOUNTER — Other Ambulatory Visit: Payer: Self-pay | Admitting: Family Medicine

## 2019-11-22 DIAGNOSIS — E785 Hyperlipidemia, unspecified: Secondary | ICD-10-CM

## 2019-11-24 ENCOUNTER — Other Ambulatory Visit: Payer: Self-pay | Admitting: Family Medicine

## 2019-11-24 DIAGNOSIS — I10 Essential (primary) hypertension: Secondary | ICD-10-CM

## 2019-12-12 DIAGNOSIS — J449 Chronic obstructive pulmonary disease, unspecified: Secondary | ICD-10-CM | POA: Diagnosis not present

## 2019-12-12 DIAGNOSIS — R05 Cough: Secondary | ICD-10-CM | POA: Diagnosis not present

## 2019-12-18 LAB — HM DIABETES EYE EXAM

## 2019-12-20 DIAGNOSIS — Z794 Long term (current) use of insulin: Secondary | ICD-10-CM | POA: Diagnosis not present

## 2019-12-20 DIAGNOSIS — E11630 Type 2 diabetes mellitus with periodontal disease: Secondary | ICD-10-CM | POA: Diagnosis not present

## 2019-12-20 DIAGNOSIS — G4730 Sleep apnea, unspecified: Secondary | ICD-10-CM | POA: Diagnosis not present

## 2019-12-20 DIAGNOSIS — E785 Hyperlipidemia, unspecified: Secondary | ICD-10-CM | POA: Diagnosis not present

## 2019-12-20 DIAGNOSIS — J449 Chronic obstructive pulmonary disease, unspecified: Secondary | ICD-10-CM | POA: Diagnosis not present

## 2019-12-20 DIAGNOSIS — I10 Essential (primary) hypertension: Secondary | ICD-10-CM | POA: Diagnosis not present

## 2019-12-20 DIAGNOSIS — M069 Rheumatoid arthritis, unspecified: Secondary | ICD-10-CM | POA: Diagnosis not present

## 2019-12-20 DIAGNOSIS — I499 Cardiac arrhythmia, unspecified: Secondary | ICD-10-CM | POA: Diagnosis not present

## 2019-12-20 DIAGNOSIS — E1165 Type 2 diabetes mellitus with hyperglycemia: Secondary | ICD-10-CM | POA: Diagnosis not present

## 2019-12-20 DIAGNOSIS — G8929 Other chronic pain: Secondary | ICD-10-CM | POA: Diagnosis not present

## 2019-12-24 ENCOUNTER — Other Ambulatory Visit: Payer: Self-pay

## 2019-12-26 DIAGNOSIS — M5126 Other intervertebral disc displacement, lumbar region: Secondary | ICD-10-CM | POA: Diagnosis not present

## 2019-12-26 DIAGNOSIS — M48062 Spinal stenosis, lumbar region with neurogenic claudication: Secondary | ICD-10-CM | POA: Diagnosis not present

## 2019-12-26 DIAGNOSIS — M5416 Radiculopathy, lumbar region: Secondary | ICD-10-CM | POA: Diagnosis not present

## 2020-01-16 ENCOUNTER — Other Ambulatory Visit: Payer: Self-pay | Admitting: Family Medicine

## 2020-01-16 DIAGNOSIS — I10 Essential (primary) hypertension: Secondary | ICD-10-CM

## 2020-01-18 ENCOUNTER — Ambulatory Visit: Payer: Medicare HMO | Attending: Internal Medicine

## 2020-01-18 DIAGNOSIS — Z23 Encounter for immunization: Secondary | ICD-10-CM | POA: Insufficient documentation

## 2020-01-18 NOTE — Progress Notes (Signed)
   Covid-19 Vaccination Clinic  Name:  Judy Bolton    MRN: [id number] DOB: 1946-02-12  01/18/2020  Judy Bolton was observed post Covid-19 immunization for 15 minutes without incidence. She was provided with Vaccine Information Sheet and instruction to access the V-Safe system.   Judy Bolton was instructed to call 911 with any severe reactions post vaccine: Marland Kitchen Difficulty breathing  . Swelling of your face and throat  . A fast heartbeat  . A bad rash all over your body  . Dizziness and weakness    Immunizations Administered    Name Date Dose VIS Date Route   Pfizer COVID-19 Vaccine 01/18/2020  9:07 AM 0.3 mL 11/16/2019 Intramuscular   Manufacturer: [employer], [employer]   Lot: [id number]   NDC: [address]-8

## 2020-01-31 ENCOUNTER — Ambulatory Visit (INDEPENDENT_AMBULATORY_CARE_PROVIDER_SITE_OTHER): Payer: Medicare HMO | Admitting: Family Medicine

## 2020-01-31 ENCOUNTER — Other Ambulatory Visit: Payer: Self-pay

## 2020-01-31 ENCOUNTER — Encounter: Payer: Self-pay | Admitting: Family Medicine

## 2020-01-31 VITALS — BP 120/80 | HR 72 | Ht 62.0 in | Wt 143.0 lb

## 2020-01-31 DIAGNOSIS — J209 Acute bronchitis, unspecified: Secondary | ICD-10-CM

## 2020-01-31 DIAGNOSIS — I10 Essential (primary) hypertension: Secondary | ICD-10-CM | POA: Diagnosis not present

## 2020-01-31 DIAGNOSIS — J440 Chronic obstructive pulmonary disease with acute lower respiratory infection: Secondary | ICD-10-CM

## 2020-01-31 DIAGNOSIS — E785 Hyperlipidemia, unspecified: Secondary | ICD-10-CM | POA: Diagnosis not present

## 2020-01-31 MED ORDER — MONTELUKAST SODIUM 10 MG PO TABS: 10.0000 mg | ORAL_TABLET | Freq: Every day | ORAL | 1 refills | Status: DC

## 2020-01-31 MED ORDER — ATORVASTATIN CALCIUM 40 MG PO TABS: 40.0000 mg | ORAL_TABLET | Freq: Every day | ORAL | 1 refills | Status: DC

## 2020-01-31 MED ORDER — GEMFIBROZIL 600 MG PO TABS: 600.0000 mg | ORAL_TABLET | Freq: Two times a day (BID) | ORAL | 1 refills | Status: DC

## 2020-01-31 MED ORDER — HYDROCHLOROTHIAZIDE 25 MG PO TABS: 25.0000 mg | ORAL_TABLET | Freq: Every day | ORAL | 1 refills | Status: DC

## 2020-01-31 MED ORDER — METOPROLOL TARTRATE 25 MG PO TABS: 25.0000 mg | ORAL_TABLET | Freq: Two times a day (BID) | ORAL | 1 refills | Status: DC

## 2020-01-31 NOTE — Progress Notes (Signed)
Date:  01/31/2020   Name:  Judy Bolton   DOB:  09-Jan-1946   MRN:  [id number]   Chief Complaint: Hyperlipidemia and Hypertension  Hyperlipidemia This is a chronic problem. The current episode started more than 1 year ago. The problem is controlled. Recent lipid tests were reviewed and are normal. She has no history of chronic renal disease, diabetes, hypothyroidism, liver disease, obesity or nephrotic syndrome. There are no known factors aggravating her hyperlipidemia. Pertinent negatives include no chest pain, focal sensory loss, focal weakness, leg pain, myalgias or shortness of breath. Current antihyperlipidemic treatment includes statins. The current treatment provides moderate improvement of lipids. There are no compliance problems.  Risk factors for coronary artery disease include dyslipidemia, hypertension and female sex.  Hypertension This is a chronic problem. The current episode started more than 1 year ago. The problem has been gradually improving since onset. The problem is controlled. Pertinent negatives include no anxiety, blurred vision, chest pain, headaches, malaise/fatigue, neck pain, orthopnea, palpitations, peripheral edema, PND, shortness of breath or sweats. There are no associated agents to hypertension. Risk factors for coronary artery disease include dyslipidemia. Past treatments include beta blockers and diuretics. The current treatment provides moderate improvement. There are no compliance problems.  There is no history of angina, kidney disease, CAD/MI, CVA, heart failure, left ventricular hypertrophy, PVD or retinopathy. There is no history of chronic renal disease, a hypertension causing med or renovascular disease.  Asthma She complains of wheezing. There is no chest tightness, cough, difficulty breathing, frequent throat clearing, hemoptysis, hoarse voice, shortness of breath or sputum production. This is a chronic problem. The current episode started more than 1 year  ago. The problem occurs daily. The problem has been waxing and waning. Pertinent negatives include no appetite change, chest pain, ear pain, fever, headaches, heartburn, malaise/fatigue, myalgias, PND, rhinorrhea, sneezing, sore throat or sweats. Her past medical history is significant for asthma.    Lab Results  Component Value Date   CREATININE 1.22 (H) 10/03/2019   BUN 33 (H) 05/31/2019   NA 135 05/31/2019   K 4.3 05/31/2019   CL 97 (L) 05/31/2019   CO2 22 05/31/2019   Lab Results  Component Value Date   CHOL 185 01/24/2018   HDL 66 01/24/2018   LDLCALC 88 01/24/2018   TRIG 154 (H) 01/24/2018   CHOLHDL 2.8 01/24/2018   No results found for: TSH Lab Results  Component Value Date   HGBA1C 5.5 06/12/2018     Review of Systems  Constitutional: Negative.  Negative for appetite change, chills, fatigue, fever, malaise/fatigue and unexpected weight change.  HENT: Negative for congestion, ear discharge, ear pain, hoarse voice, mouth sores, rhinorrhea, sinus pressure, sneezing and sore throat.   Eyes: Negative for blurred vision, photophobia, pain, discharge, redness and itching.  Respiratory: Positive for wheezing. Negative for cough, hemoptysis, sputum production, shortness of breath and stridor.   Cardiovascular: Negative for chest pain, palpitations, orthopnea and PND.  Gastrointestinal: Negative for abdominal pain, blood in stool, constipation, diarrhea, heartburn, nausea and vomiting.  Endocrine: Negative for cold intolerance, heat intolerance, polydipsia, polyphagia and polyuria.  Genitourinary: Negative for dysuria, flank pain, frequency, hematuria, menstrual problem, pelvic pain, urgency, vaginal bleeding and vaginal discharge.  Musculoskeletal: Negative for arthralgias, back pain, myalgias and neck pain.  Skin: Negative for rash.  Allergic/Immunologic: Negative for environmental allergies and food allergies.  Neurological: Negative for dizziness, focal weakness, weakness,  light-headedness, numbness and headaches.  Hematological: Negative for adenopathy. Does not bruise/bleed easily.  Psychiatric/Behavioral: Negative for dysphoric mood. The patient is not nervous/anxious.     Patient Active Problem List   Diagnosis Date Noted  . Essential hypertension 08/30/2017  . Gastroesophageal reflux disease 08/30/2017  . Aortic atherosclerosis (HCC) 05/31/2017  . PAD (peripheral artery disease) (HCC) 05/31/2017  . Encounter for long-term (current) use of high-risk medication 12/28/2016  . Bilateral hand pain 11/08/2016  . Elevated C-reactive protein 11/08/2016  . Swelling of joint of right hand 11/08/2016  . Type 2 diabetes mellitus with microalbuminuria, without long-term current use of insulin (HCC) 07/06/2016  . Acute anxiety 04/16/2016  . Bronchitis 04/16/2016  . Centrilobular emphysema (HCC) 04/16/2016  . TIA (transient ischemic attack) 02/29/2016  . Lumbar stenosis with neurogenic claudication 04/16/2015  . Spondylosis of lumbar region without myelopathy or radiculopathy 04/16/2015  . Hypertriglyceridemia 10/15/2014    Allergies  Allergen Reactions  . Betadine [Povidone Iodine] Swelling  . Codeine Itching  . Latex Itching and Swelling    (urinary catheter)  . Meloxicam Swelling  . Penicillins Other (See Comments)    Paralysis  Has patient had a PCN reaction causing immediate rash, facial/tongue/throat swelling, SOB or lightheadedness with hypotension: No Has patient had a PCN reaction causing severe rash involving mucus membranes or skin necrosis: No Has patient had a PCN reaction that required hospitalization Yes Has patient had a PCN reaction occurring within the last 10 years: No If all of the above answers are "NO", then may proceed with Cephalosporin use.    Past Surgical History:  Procedure Laterality Date  . ABDOMINAL HYSTERECTOMY    . ANTERIOR (CYSTOCELE) AND POSTERIOR REPAIR (RECTOCELE) WITH XENFORM GRAFT AND SACROSPINOUS FIXATION    .  APPENDECTOMY    . BROW LIFT Bilateral 09/05/2018   Procedure: BLEPHAROPLASTY UPPER EYELID W/EXCESS SKIN;  Surgeon: Imagene Riches, MD;  Location: [facility];  Service: Ophthalmology;  Laterality: Bilateral;  . CTR Bilateral   . LUMBAR LAMINECTOMY/DECOMPRESSION MICRODISCECTOMY N/A 07/27/2017   Procedure: LUMBAR LAMINECTOMY/DECOMPRESSION MICRODISCECTOMY 3 LEVELS-L3-S1;  Surgeon: Venetia Night, MD;  Location: [facility];  Service: Neurosurgery;  Laterality: N/A;  . PTOSIS REPAIR Bilateral 09/05/2018   Procedure: PTOSIS REPAIR RESECT EX;  Surgeon: Imagene Riches, MD;  Location: [facility];  Service: Ophthalmology;  Laterality: Bilateral;  Diabetic - oral meds sleep apnea Latexd allergy  . ROTATOR CUFF REPAIR Right     Social History   Tobacco Use  . Smoking status: Former Smoker    Packs/day: 1.50    Years: 28.00    Pack years: 42.00    Types: Cigarettes    Quit date: 08/14/2000    Years since quitting: 19.4  . Smokeless tobacco: Never Used  . Tobacco comment: smoking cessatiuon materials not required  Substance Use Topics  . Alcohol use: No  . Drug use: No     Medication list has been reviewed and updated.  Current Meds  Medication Sig  . albuterol (PROVENTIL HFA;VENTOLIN HFA) 108 (90 Base) MCG/ACT inhaler Inhale 2 puffs into the lungs every 6 (six) hours as needed for wheezing or shortness of breath.  Marland Kitchen albuterol (PROVENTIL) (2.5 MG/3ML) 0.083% nebulizer solution Take 3 mLs (2.5 mg total) by nebulization every 6 (six) hours as needed for wheezing or shortness of breath.  Marland Kitchen aspirin EC 81 MG tablet Take by mouth.  Marland Kitchen atorvastatin (LIPITOR) 40 MG tablet TAKE 1 TABLET BY MOUTH EVERY DAY  . cyclobenzaprine (FLEXERIL) 10 MG tablet Take 1 tablet (10 mg total) by mouth 3 (three) times daily  as needed for muscle spasms.  Marland Kitchen gemfibrozil (LOPID) 600 MG tablet TAKE 1 TABLET BY MOUTH TWICE A DAY  . hydrochlorothiazide (HYDRODIURIL) 25 MG tablet TAKE 1 TABLET BY MOUTH EVERY DAY    . metFORMIN (GLUCOPHAGE) 1000 MG tablet TAKE 1 TABLET BY MOUTH TWICE A DAY (Patient taking differently: Take 500 mg by mouth 2 (two) times daily with a meal. Dr Dareen Piano)  . metoprolol tartrate (LOPRESSOR) 25 MG tablet TAKE 1 TABLET BY MOUTH TWICE A DAY  . montelukast (SINGULAIR) 10 MG tablet Take 1 tablet by mouth daily.  Marland Kitchen nystatin cream (MYCOSTATIN) Apply 1 application topically 2 (two) times daily.  Marland Kitchen omeprazole (PRILOSEC) 20 MG capsule Take 20 mg by mouth daily. otc  . predniSONE (DELTASONE) 10 MG tablet Taper 6,6,6,5,5,5,4,4,3,3,2,2,1,1 (Patient taking differently: 10 mg daily with breakfast. [place])    [facility] 2/9 Scores 01/31/2020 03/09/2019 01/31/2019 05/09/2018  PHQ - 2 Score 0 0 0 0  PHQ- 9 Score 1 0 - 0    BP Readings from Last 3 Encounters:  01/31/20 120/80  10/10/19 124/78  08/24/19 132/82    Physical Exam Vitals and nursing note reviewed.  Constitutional:      General: She is not in acute distress.    Appearance: She is not diaphoretic.  HENT:     Head: Normocephalic and atraumatic.     Right Ear: Tympanic membrane and external ear normal.     Left Ear: Tympanic membrane and external ear normal.     Nose: Nose normal. No congestion or rhinorrhea.  Eyes:     General:        Right eye: No discharge.        Left eye: No discharge.     Conjunctiva/sclera: Conjunctivae normal.     Pupils: Pupils are equal, round, and reactive to light.  Neck:     Thyroid: No thyromegaly.     Vascular: No JVD.  Cardiovascular:     Rate and Rhythm: Normal rate and regular rhythm.     Heart sounds: Normal heart sounds. No murmur. No friction rub. No gallop.   Pulmonary:     Effort: Pulmonary effort is normal.     Breath sounds: Wheezing present. No rhonchi or rales.     Comments: Increase I/E Chest:     Chest wall: No tenderness.  Abdominal:     General: Bowel sounds are normal.     Palpations: Abdomen is soft. There is no mass.     Tenderness: There is no abdominal tenderness. There  is no guarding.  Musculoskeletal:        General: Normal range of motion.     Cervical back: Normal range of motion and neck supple.  Lymphadenopathy:     Cervical: No cervical adenopathy.  Skin:    General: Skin is warm and dry.  Neurological:     Mental Status: She is alert.     Deep Tendon Reflexes: Reflexes are normal and symmetric.     Wt Readings from Last 3 Encounters:  01/31/20 143 lb (64.9 kg)  10/10/19 147 lb (66.7 kg)  08/24/19 138 lb (62.6 kg)    BP 120/80   Pulse 72   Ht 5\' 2"  (1.575 m)   Wt 143 lb (64.9 kg)   BMI 26.16 kg/m   Assessment and Plan:  1. Essential hypertension Chronic.  Controlled.  Stable.  Continue metoprolol 25 mg twice a day and hydrochlorothiazide 25 mg once a day.  Upon review of patient's most recent  renal function it was noted to be within normal limits and we will therefore wait and repeat in 6 months - metoprolol tartrate (LOPRESSOR) 25 MG tablet; Take 1 tablet (25 mg total) by mouth 2 (two) times daily.  Dispense: 180 tablet; Refill: 1 - hydrochlorothiazide (HYDRODIURIL) 25 MG tablet; Take 1 tablet (25 mg total) by mouth daily.  Dispense: 90 tablet; Refill: 1  2. Hyperlipidemia, unspecified hyperlipidemia type Chronic.  Controlled.  Stable.  Continue atorvastatin 40 mg once a day and gemfibrozil 600 mg twice a day.  I have reviewed previous lipid panel and we will wait and repeat in 6 months - atorvastatin (LIPITOR) 40 MG tablet; Take 1 tablet (40 mg total) by mouth daily.  Dispense: 90 tablet; Refill: 1 - gemfibrozil (LOPID) 600 MG tablet; Take 1 tablet (600 mg total) by mouth 2 (two) times daily.  Dispense: 180 tablet; Refill: 1  3. COPD with acute bronchitis (HCC) Chronic.  Controlled.  Stable.  Patient is having an exacerbation of her asthma probably from the pollen count is increasing given the recent change in weather.  In addition to her albuterol inhaler patient will initiate Dulera 100-5 twice a day over the next 3 months.

## 2020-02-06 ENCOUNTER — Ambulatory Visit (INDEPENDENT_AMBULATORY_CARE_PROVIDER_SITE_OTHER): Payer: Medicare HMO

## 2020-02-06 DIAGNOSIS — Z0000 Encounter for general adult medical examination without abnormal findings: Secondary | ICD-10-CM

## 2020-02-06 NOTE — Patient Instructions (Signed)
Judy Bolton , Thank you for taking time to come for your Medicare Wellness Visit. I appreciate your ongoing commitment to your health goals. Please review the following plan we discussed and let me know if I can assist you in the future.   Screening recommendations/referrals: Colonoscopy: done 10/29/14. Repeat in 2025. Mammogram: done 02/15/19 Bone Density: done 02/15/19 Recommended yearly ophthalmology/optometry visit for glaucoma screening and checkup Recommended yearly dental visit for hygiene and checkup  Vaccinations: Influenza vaccine: done 08/15/19 Pneumococcal vaccine: done 09/08/17 Tdap vaccine: done 08/30/17 Shingles vaccine: due for second dose of Shingrix   Covid-19: 1st dose 01/18/20  Advanced directives: Please bring a copy of your health care power of attorney and living will to the office at your convenience.  Conditions/risks identified: Recommend increasing physical activity  Next appointment: Please follow up in one year for your Medicare Annual Wellness visit.     Preventive Care 13 Years and Older, Female Preventive care refers to lifestyle choices and visits with your health care provider that can promote health and wellness. What does preventive care include?  A yearly physical exam. This is also called an annual well check.  Dental exams once or twice a year.  Routine eye exams. Ask your health care provider how often you should have your eyes checked.  Personal lifestyle choices, including:  Daily care of your teeth and gums.  Regular physical activity.  Eating a healthy diet.  Avoiding tobacco and drug use.  Limiting alcohol use.  Practicing safe sex.  Taking low-dose aspirin every day.  Taking vitamin and mineral supplements as recommended by your health care provider. What happens during an annual well check? The services and screenings done by your health care provider during your annual well check will depend on your age, overall health,  lifestyle risk factors, and family history of disease. Counseling  Your health care provider may ask you questions about your:  Alcohol use.  Tobacco use.  Drug use.  Emotional well-being.  Home and relationship well-being.  Sexual activity.  Eating habits.  History of falls.  Memory and ability to understand (cognition).  Work and work Astronomer.  Reproductive health. Screening  You may have the following tests or measurements:  Height, weight, and BMI.  Blood pressure.  Lipid and cholesterol levels. These may be checked every 5 years, or more frequently if you are over 45 years old.  Skin check.  Lung cancer screening. You may have this screening every year starting at age 30 if you have a 30-pack-year history of smoking and currently smoke or have quit within the past 15 years.  Fecal occult blood test (FOBT) of the stool. You may have this test every year starting at age 69.  Flexible sigmoidoscopy or colonoscopy. You may have a sigmoidoscopy every 5 years or a colonoscopy every 10 years starting at age 47.  Hepatitis C blood test.  Hepatitis B blood test.  Sexually transmitted disease (STD) testing.  Diabetes screening. This is done by checking your blood sugar (glucose) after you have not eaten for a while (fasting). You may have this done every 1-3 years.  Bone density scan. This is done to screen for osteoporosis. You may have this done starting at age 47.  Mammogram. This may be done every 1-2 years. Talk to your health care provider about how often you should have regular mammograms. Talk with your health care provider about your test results, treatment options, and if necessary, the need for more tests. Vaccines  Your health care provider may recommend certain vaccines, such as:  Influenza vaccine. This is recommended every year.  Tetanus, diphtheria, and acellular pertussis (Tdap, Td) vaccine. You may need a Td booster every 10 years.  Zoster  vaccine. You may need this after age 66.  Pneumococcal 13-valent conjugate (PCV13) vaccine. One dose is recommended after age 21.  Pneumococcal polysaccharide (PPSV23) vaccine. One dose is recommended after age 70. Talk to your health care provider about which screenings and vaccines you need and how often you need them. This information is not intended to replace advice given to you by your health care provider. Make sure you discuss any questions you have with your health care provider. Document Released: 12/19/2015 Document Revised: 08/11/2016 Document Reviewed: 09/23/2015 Elsevier Interactive Patient Education  2017 [employer] Prevention in the Home Falls can cause injuries. They can happen to people of all ages. There are many things you can do to make your home safe and to help prevent falls. What can I do on the outside of my home?  Regularly fix the edges of walkways and driveways and fix any cracks.  Remove anything that might make you trip as you walk through a door, such as a raised step or threshold.  Trim any bushes or trees on the path to your home.  Use bright outdoor lighting.  Clear any walking paths of anything that might make someone trip, such as rocks or tools.  Regularly check to see if handrails are loose or broken. Make sure that both sides of any steps have handrails.  Any raised decks and porches should have guardrails on the edges.  Have any leaves, snow, or ice cleared regularly.  Use sand or salt on walking paths during winter.  Clean up any spills in your garage right away. This includes oil or grease spills. What can I do in the bathroom?  Use night lights.  Install grab bars by the toilet and in the tub and shower. Do not use towel bars as grab bars.  Use non-skid mats or decals in the tub or shower.  If you need to sit down in the shower, use a plastic, non-slip stool.  Keep the floor dry. Clean up any water that spills on the  floor as soon as it happens.  Remove soap buildup in the tub or shower regularly.  Attach bath mats securely with double-sided non-slip rug tape.  Do not have throw rugs and other things on the floor that can make you trip. What can I do in the bedroom?  Use night lights.  Make sure that you have a light by your bed that is easy to reach.  Do not use any sheets or blankets that are too big for your bed. They should not hang down onto the floor.  Have a firm chair that has side arms. You can use this for support while you get dressed.  Do not have throw rugs and other things on the floor that can make you trip. What can I do in the kitchen?  Clean up any spills right away.  Avoid walking on wet floors.  Keep items that you use a lot in easy-to-reach places.  If you need to reach something above you, use a strong step stool that has a grab bar.  Keep electrical cords out of the way.  Do not use floor polish or wax that makes floors slippery. If you must use wax, use non-skid floor wax.  Do  not have throw rugs and other things on the floor that can make you trip. What can I do with my stairs?  Do not leave any items on the stairs.  Make sure that there are handrails on both sides of the stairs and use them. Fix handrails that are broken or loose. Make sure that handrails are as long as the stairways.  Check any carpeting to make sure that it is firmly attached to the stairs. Fix any carpet that is loose or worn.  Avoid having throw rugs at the top or bottom of the stairs. If you do have throw rugs, attach them to the floor with carpet tape.  Make sure that you have a light switch at the top of the stairs and the bottom of the stairs. If you do not have them, ask someone to add them for you. What else can I do to help prevent falls?  Wear shoes that:  Do not have high heels.  Have rubber bottoms.  Are comfortable and fit you well.  Are closed at the toe. Do not wear  sandals.  If you use a stepladder:  Make sure that it is fully opened. Do not climb a closed stepladder.  Make sure that both sides of the stepladder are locked into place.  Ask someone to hold it for you, if possible.  Clearly mark and make sure that you can see:  Any grab bars or handrails.  First and last steps.  Where the edge of each step is.  Use tools that help you move around (mobility aids) if they are needed. These include:  Canes.  Walkers.  Scooters.  Crutches.  Turn on the lights when you go into a dark area. Replace any light bulbs as soon as they burn out.  Set up your furniture so you have a clear path. Avoid moving your furniture around.  If any of your floors are uneven, fix them.  If there are any pets around you, be aware of where they are.  Review your medicines with your doctor. Some medicines can make you feel dizzy. This can increase your chance of falling. Ask your doctor what other things that you can do to help prevent falls. This information is not intended to replace advice given to you by your health care provider. Make sure you discuss any questions you have with your health care provider. Document Released: 09/18/2009 Document Revised: 04/29/2016 Document Reviewed: 12/27/2014 Elsevier Interactive Patient Education  2017 [employer].

## 2020-02-06 NOTE — Progress Notes (Signed)
Subjective:   Judy Bolton is a 74 y.o. female who presents for Medicare Annual (Subsequent) preventive examination.  Virtual Visit via Telephone Note  I connected with [name] on 02/06/20 at 10:00 AM EST by telephone and verified that I am speaking with the correct person using two identifiers.  Medicare Annual Wellness visit completed telephonically due to Covid-19 pandemic.   Location: Patient: home Provider: office   I discussed the limitations, risks, security and privacy concerns of performing an evaluation and management service by telephone and the availability of in person appointments. The patient expressed understanding and agreed to proceed.  Some vital signs may be absent or patient reported.   Reather Littler, LPN    Review of Systems:   Cardiac Risk Factors include: advanced age (>18men, >29 women);diabetes mellitus;dyslipidemia;hypertension     Objective:     Vitals: There were no vitals taken for this visit.  There is no height or weight on file to calculate BMI.  Advanced Directives 02/06/2020 05/31/2019 01/31/2019 09/05/2018 01/23/2018 01/07/2018 07/27/2017  Does Patient Have a Medical Advance Directive? Yes Yes Yes No Yes Yes Yes  Type of Estate agent of [place];Living will Healthcare Power of Attorney Living will;Healthcare Power of Attorney - Living will Living will Healthcare Power of [place];Living will  Does patient want to make changes to medical advance directive? - No - Patient declined Yes (MAU/Ambulatory/Procedural Areas - Information given) - Yes (MAU/Ambulatory/Procedural Areas - Information given) - No - Patient declined  Copy of Healthcare Power of Attorney in Chart? No - copy requested No - copy requested No - copy requested - - - No - copy requested  Would patient like information on creating a medical advance directive? - No - Patient declined - No - Patient declined - No - Patient declined -    Tobacco Social  History   Tobacco Use  Smoking Status Former Smoker  . Packs/day: 1.50  . Years: 28.00  . Pack years: 42.00  . Types: Cigarettes  . Quit date: 08/14/2000  . Years since quitting: 19.4  Smokeless Tobacco Never Used  Tobacco Comment   smoking cessatiuon materials not required     Counseling given: Not Answered Comment: smoking cessatiuon materials not required   Clinical Intake:  Pre-visit preparation completed: Yes  Pain : 0-10 Pain Score: 3  Pain Type: Chronic pain Pain Location: Shoulder Pain Orientation: Right Pain Descriptors / Indicators: Aching, Sore, Throbbing Pain Onset: More than a month ago Pain Frequency: Constant     BMI - recorded: 26.16 Nutritional Status: BMI 25 -29 Overweight Nutritional Risks: None Diabetes: Yes CBG done?: No Did pt. bring in CBG monitor from home?: No   Nutrition Risk Assessment:  Has the patient had any N/V/D within the last 2 months?  No  Does the patient have any non-healing wounds?  No  Has the patient had any unintentional weight loss or weight gain?  No   Diabetes:  Is the patient diabetic?  Yes  If diabetic, was a CBG obtained today?  No  Did the patient bring in their glucometer from home?  No  How often do you monitor your CBG's? daily.   Financial Strains and Diabetes Management:  Are you having any financial strains with the device, your supplies or your medication? No .  Does the patient want to be seen by Chronic Care Management for management of their diabetes?  No  Would the patient like to be referred to a Nutritionist or  for Diabetic Management?  No   Diabetic Exams:  Diabetic Eye Exam: Completed 12/18/19.   Diabetic Foot Exam: Completed per patient.  How often do you need to have someone help you when you read instructions, pamphlets, or other written materials from your doctor or pharmacy?: 1 - Never  Interpreter Needed?: No  Information entered by :: Reather Littler LPN  Past Medical History:    Diagnosis Date  . Acid reflux   . Arthritis   . Asthma   . Back pain   . Back pain   . COPD (chronic obstructive pulmonary disease) (HCC)   . Diabetes mellitus without complication (HCC)   . Hyperlipidemia   . Hypertension   . Seizure (HCC)    x1 - over 30 yrs ago  . Sleep apnea    No CPAP  . Stroke [facility])    March of 2017 vocabulary   Past Surgical History:  Procedure Laterality Date  . ABDOMINAL HYSTERECTOMY    . ANTERIOR (CYSTOCELE) AND POSTERIOR REPAIR (RECTOCELE) WITH XENFORM GRAFT AND SACROSPINOUS FIXATION    . APPENDECTOMY    . BROW LIFT Bilateral 09/05/2018   Procedure: BLEPHAROPLASTY UPPER EYELID W/EXCESS SKIN;  Surgeon: Imagene Riches, MD;  Location: [facility];  Service: Ophthalmology;  Laterality: Bilateral;  . CTR Bilateral   . LUMBAR LAMINECTOMY/DECOMPRESSION MICRODISCECTOMY N/A 07/27/2017   Procedure: LUMBAR LAMINECTOMY/DECOMPRESSION MICRODISCECTOMY 3 LEVELS-L3-S1;  Surgeon: Venetia Night, MD;  Location: [facility];  Service: Neurosurgery;  Laterality: N/A;  . PTOSIS REPAIR Bilateral 09/05/2018   Procedure: PTOSIS REPAIR RESECT EX;  Surgeon: Imagene Riches, MD;  Location: [facility];  Service: Ophthalmology;  Laterality: Bilateral;  Diabetic - oral meds sleep apnea Latexd allergy  . ROTATOR CUFF REPAIR Right    Family History  Problem Relation Age of Onset  . Heart attack Mother   . Prostate cancer Father   . Brain cancer Father   . Heart attack Maternal Grandmother   . Heart attack Maternal Grandfather   . Breast cancer Paternal Grandmother    Social History   Socioeconomic History  . Marital status: Widowed    Spouse name: Not on file  . Number of children: 1  . Years of education: some college  . Highest education level: 12th grade  Occupational History  . Occupation: Retired  Tobacco Use  . Smoking status: Former Smoker    Packs/day: 1.50    Years: 28.00    Pack years: 42.00    Types: Cigarettes    Quit date: 08/14/2000     Years since quitting: 19.4  . Smokeless tobacco: Never Used  . Tobacco comment: smoking cessatiuon materials not required  Substance and Sexual Activity  . Alcohol use: No  . Drug use: No  . Sexual activity: Not Currently  Other Topics Concern  . Not on file  Social History Narrative  . Not on file   Social Determinants of Health   Financial Resource Strain: Low Risk   . Difficulty of Paying Living Expenses: Not hard at all  Food Insecurity: No Food Insecurity  . Worried About Programme researcher, broadcasting/film/video in the Last Year: Never true  . Ran Out of Food in the Last Year: Never true  Transportation Needs: No Transportation Needs  . Lack of Transportation (Medical): No  . Lack of Transportation (Non-Medical): No  Physical Activity: Inactive  . Days of Exercise per Week: 0 days  . Minutes of Exercise per Session: 0 min  Stress: No Stress Concern  Present  . Feeling of Stress : Not at all  Social Connections: Slightly Isolated  . Frequency of Communication with Friends and Family: More than three times a week  . Frequency of Social Gatherings with Friends and Family: More than three times a week  . Attends Religious Services: More than 4 times per year  . Active Member of Clubs or Organizations: Yes  . Attends Banker Meetings: More than 4 times per year  . Marital Status: Widowed    Outpatient Encounter Medications as of 02/06/2020  Medication Sig  . albuterol (PROVENTIL HFA;VENTOLIN HFA) 108 (90 Base) MCG/ACT inhaler Inhale 2 puffs into the lungs every 6 (six) hours as needed for wheezing or shortness of breath.  Marland Kitchen albuterol (PROVENTIL) (2.5 MG/3ML) 0.083% nebulizer solution Take 3 mLs (2.5 mg total) by nebulization every 6 (six) hours as needed for wheezing or shortness of breath.  Marland Kitchen aspirin EC 81 MG tablet Take by mouth.  Marland Kitchen atorvastatin (LIPITOR) 40 MG tablet Take 1 tablet (40 mg total) by mouth daily.  . cyclobenzaprine (FLEXERIL) 10 MG tablet Take 1 tablet (10 mg total) by  mouth 3 (three) times daily as needed for muscle spasms.  Marland Kitchen gabapentin (NEURONTIN) 100 MG capsule Take 1 capsule by mouth at bedtime.  Marland Kitchen gemfibrozil (LOPID) 600 MG tablet Take 1 tablet (600 mg total) by mouth 2 (two) times daily.  . hydrochlorothiazide (HYDRODIURIL) 25 MG tablet Take 1 tablet (25 mg total) by mouth daily.  . Investigational - Study Medication Study name: Diabetes medication - oral tablet once daily Additional study details: Dr. Dareen Piano with [facility]  . metFORMIN (GLUCOPHAGE) 500 MG tablet Take 500 mg by mouth 2 (two) times daily.  . metoprolol tartrate (LOPRESSOR) 25 MG tablet Take 1 tablet (25 mg total) by mouth 2 (two) times daily.  . montelukast (SINGULAIR) 10 MG tablet Take 1 tablet (10 mg total) by mouth daily.  Marland Kitchen omeprazole (PRILOSEC) 20 MG capsule Take 20 mg by mouth daily. otc  . glipiZIDE (GLUCOTROL) 5 MG tablet Take 5 mg by mouth every morning.  Marland Kitchen ipratropium (ATROVENT) 0.02 % nebulizer solution Inhale 2.5 mLs into the lungs 4 (four) times daily.  . [DISCONTINUED] metFORMIN (GLUCOPHAGE) 1000 MG tablet TAKE 1 TABLET BY MOUTH TWICE A DAY (Patient taking differently: Take 500 mg by mouth 2 (two) times daily with a meal. Dr Dareen Piano)  . [DISCONTINUED] nystatin cream (MYCOSTATIN) Apply 1 application topically 2 (two) times daily.  . [DISCONTINUED] predniSONE (DELTASONE) 10 MG tablet Taper 6,6,6,5,5,5,4,4,3,3,2,2,1,1 (Patient taking differently: 10 mg daily with breakfast. Meredeth Ide)   No facility-administered encounter medications on file as of 02/06/2020.    Activities of Daily Living In your present state of health, do you have any difficulty performing the following activities: 02/06/2020  Hearing? N  Comment declines hearing aids  Vision? N  Difficulty concentrating or making decisions? N  Walking or climbing stairs? N  Dressing or bathing? N  Doing errands, shopping? N  Preparing Food and eating ? N  Using the Toilet? N  In the past six months, have you  accidently leaked urine? N  Do you have problems with loss of bowel control? N  Managing your Medications? N  Managing your Finances? N  Housekeeping or managing your Housekeeping? N  Some recent data might be hidden    Patient Care Team: Duanne Limerick, MD as PCP - General (Family Medicine) Mertie Moores, MD as Consulting Physician (Specialist)    Assessment:   This  is a routine wellness examination for [place].  Exercise Activities and Dietary recommendations Current Exercise Habits: The patient does not participate in regular exercise at present, Exercise limited by: orthopedic condition(s)  Goals    . DIET - INCREASE WATER INTAKE     Recommend to drink at least 6-8 8oz glasses of water per day.    . Patient Stated     Pt states she would like to maintain her health and lower A1C.       Fall Risk Fall Risk  02/06/2020 01/31/2019 01/23/2018 06/23/2017 04/16/2016  Falls in the past year? 1 1 No No No  Number falls in past yr: 1 1 - - -  Comment 02/2019 tripped over nebulizer cord - - -  Injury with Fall? 0 0 - - -  Risk for fall due to : History of fall(s) - History of fall(s);Impaired balance/gait;Impaired vision;Medication side effect - -  Risk for fall due to: Comment - - back pain, wears eyeglasses - -  Follow up Falls prevention discussed Falls prevention discussed - - -   FALL RISK PREVENTION PERTAINING TO THE HOME:  Any stairs in or around the home? Yes  If so, do they handrails? Yes   Home free of loose throw rugs in walkways, pet beds, electrical cords, etc? Yes  Adequate lighting in your home to reduce risk of falls? Yes   ASSISTIVE DEVICES UTILIZED TO PREVENT FALLS:  Life alert? No  Use of a cane, walker or w/c? cane Grab bars in the bathroom? Yes  Shower chair or bench in shower? No  Elevated toilet seat or a handicapped toilet? Yes   DME ORDERS:  DME order needed?  No   TIMED UP AND GO:  Was the test performed? No . Telephonic visit.    Education: Fall risk prevention has been discussed.  Intervention(s) required? No    Depression Screen PHQ 2/9 Scores 02/06/2020 01/31/2020 03/09/2019 01/31/2019  PHQ - 2 Score 0 0 0 0  PHQ- 9 Score - 1 0 -     Cognitive Function     6CIT Screen 01/31/2019 01/23/2018  What Year? 0 points 0 points  What month? 0 points 0 points  What time? 0 points 0 points  Count back from 20 0 points 0 points  Months in reverse 0 points 0 points  Repeat phrase 2 points 4 points  Total Score 2 4    Immunization History  Administered Date(s) Administered  . Fluad Quad(high Dose 65+) 08/15/2019  . Influenza, High Dose Seasonal PF 08/30/2017  . Influenza,inj,Quad PF,6+ Mos 09/01/2015, 09/09/2016, 08/30/2018  . PFIZER SARS-COV-2 Vaccination 01/18/2020  . Pneumococcal Conjugate-13 09/01/2015  . Pneumococcal Polysaccharide-23 09/08/2017  . Tdap 08/30/2017  . Zoster Recombinat (Shingrix) 08/15/2019    Qualifies for Shingles Vaccine? Yes   Due for second dose of Shingrix.   Tdap: Up to date  Flu Vaccine: Up to date  Pneumococcal Vaccine: Up to date    Screening Tests Health Maintenance  Topic Date Due  . MAMMOGRAM  02/15/2020  . OPHTHALMOLOGY EXAM  12/17/2020  . COLONOSCOPY  10/29/2024  . TETANUS/TDAP  08/31/2027  . INFLUENZA VACCINE  Completed  . DEXA SCAN  Completed  . PNA vac Low Risk Adult  Completed  . FOOT EXAM  Discontinued  . HEMOGLOBIN A1C  Discontinued  . URINE MICROALBUMIN  Discontinued  . Hepatitis C Screening  Discontinued    Cancer Screenings:  Colorectal Screening: Completed 10/29/14. Repeat every 10 years;  Mammogram: Completed  02/15/19. Repeat every year; Pt requests to postpone this screening.   Bone Density: Completed 02/15/19. Results reflect OSTEOPENIA. Repeat every 2 years.   Lung Cancer Screening: (Low Dose CT Chest recommended if Age 4-80 years, 30 pack-year currently smoking OR have quit w/in 15years.) does not qualify.    Additional  Screening:  Hepatitis C Screening: does qualify; Completed 01/24/18  Vision Screening: Recommended annual ophthalmology exams for early detection of glaucoma and other disorders of the eye. Is the patient up to date with their annual eye exam?  Yes  Who is the provider or what is the name of the office in which the pt attends annual eye exams? [facility]  Dental Screening: Recommended annual dental exams for proper oral hygiene  Community Resource Referral:  CRR required this visit?  No      Plan:     I have personally reviewed and addressed the Medicare Annual Wellness questionnaire and have noted the following in the patient's chart:  A. Medical and social history B. Use of alcohol, tobacco or illicit drugs  C. Current medications and supplements D. Functional ability and status E.  Nutritional status F.  Physical activity G. Advance directives H. List of other physicians I.  Hospitalizations, surgeries, and ER visits in previous 12 months J.  Vitals K. Screenings such as hearing and vision if needed, cognitive and depression L. Referrals and appointments   In addition, I have reviewed and discussed with patient certain preventive protocols, quality metrics, and best practice recommendations. A written personalized care plan for preventive services as well as general preventive health recommendations were provided to patient.   Signed,  Reather Littler, LPN Nurse Health Advisor   Nurse Notes: pt states she is in a drug trial for a new oral diabetes medication. She was also recently prescribed glipizide 5mg  and states it was making her blood sugar drop to 50-60 and she stopped taking it and will discuss with Dr. .

## 2020-02-12 ENCOUNTER — Ambulatory Visit: Payer: Medicare HMO | Attending: Internal Medicine

## 2020-02-12 DIAGNOSIS — Z23 Encounter for immunization: Secondary | ICD-10-CM

## 2020-02-12 NOTE — Progress Notes (Signed)
   Covid-[facility] Vaccination Clinic  Name:  Judy Bolton    MRN: [id number] DOB: October 26, 1946  02/12/2020  Judy Bolton was observed post Covid-19 immunization for 15 minutes without incident. She was provided with Vaccine Information Sheet and instruction to access the V-Safe system.   Judy Bolton was instructed to call 911 with any severe reactions post vaccine: Marland Kitchen Difficulty breathing  . Swelling of face and throat  . A fast heartbeat  . A bad rash all over body  . Dizziness and weakness   Immunizations Administered    Name Date Dose VIS Date Route   Pfizer COVID-19 Vaccine 02/12/2020  8:55 AM 0.3 mL 11/16/2019 Intramuscular   Manufacturer: [employer], [employer]   Lot: [id number]   NDC: [address]-7

## 2020-02-25 ENCOUNTER — Other Ambulatory Visit: Payer: Self-pay

## 2020-02-25 NOTE — Progress Notes (Unsigned)
Put in A1C 

## 2020-03-05 DIAGNOSIS — J432 Centrilobular emphysema: Secondary | ICD-10-CM | POA: Diagnosis not present

## 2020-03-05 DIAGNOSIS — R05 Cough: Secondary | ICD-10-CM | POA: Diagnosis not present

## 2020-03-05 DIAGNOSIS — R0600 Dyspnea, unspecified: Secondary | ICD-10-CM | POA: Diagnosis not present

## 2020-04-24 ENCOUNTER — Other Ambulatory Visit: Payer: Self-pay

## 2020-04-24 ENCOUNTER — Encounter: Payer: Self-pay | Admitting: Family Medicine

## 2020-04-24 ENCOUNTER — Ambulatory Visit (INDEPENDENT_AMBULATORY_CARE_PROVIDER_SITE_OTHER): Payer: Medicare HMO | Admitting: Family Medicine

## 2020-04-24 VITALS — BP 130/80 | HR 68 | Temp 98.7°F | Ht 62.0 in | Wt 141.0 lb

## 2020-04-24 DIAGNOSIS — J0100 Acute maxillary sinusitis, unspecified: Secondary | ICD-10-CM

## 2020-04-24 DIAGNOSIS — J440 Chronic obstructive pulmonary disease with acute lower respiratory infection: Secondary | ICD-10-CM

## 2020-04-24 DIAGNOSIS — J209 Acute bronchitis, unspecified: Secondary | ICD-10-CM

## 2020-04-24 DIAGNOSIS — Z1231 Encounter for screening mammogram for malignant neoplasm of breast: Secondary | ICD-10-CM | POA: Diagnosis not present

## 2020-04-24 MED ORDER — GUAIFENESIN-CODEINE 100-10 MG/5ML PO SYRP: 5.0000 mL | ORAL_SOLUTION | Freq: Four times a day (QID) | ORAL | 0 refills | Status: DC | PRN

## 2020-04-24 MED ORDER — LEVOFLOXACIN 500 MG PO TABS: 500.0000 mg | ORAL_TABLET | Freq: Every day | ORAL | 0 refills | Status: DC

## 2020-04-24 NOTE — Progress Notes (Signed)
Date:  04/24/2020   Name:  Judy Bolton   DOB:  [birth year]   MRN:  [id number]   Chief Complaint: Sore Throat (yellow production with cough)  Patient is a 74 year old female who presents for acute exacerbation of chronic underlying COPD.  Patient is followed by pulmonary and is on steroids and bronchodilators.. The patient reports the following problems: Cough with upper respiratory congestion and yellow to green sputum production. Health maintenance has been reviewed mammogram scheduling necessary.  Sinusitis This is a chronic problem. The current episode started more than 1 year ago. The problem has been waxing and waning since onset. There has been no fever. The fever has been present for 1 to 2 days. The pain is mild. Associated symptoms include congestion, coughing, shortness of breath, sinus pressure and sneezing. Pertinent negatives include no chills, diaphoresis, ear pain, headaches, hoarse voice, neck pain, sore throat or swollen glands.    Lab Results  Component Value Date   CREATININE 1.22 (H) 10/03/2019   BUN 33 (H) 05/31/2019   NA 135 05/31/2019   K 4.3 05/31/2019   CL 97 (L) 05/31/2019   CO2 22 05/31/2019   Lab Results  Component Value Date   CHOL 185 01/24/2018   HDL 66 01/24/2018   LDLCALC 88 01/24/2018   TRIG 154 (H) 01/24/2018   CHOLHDL 2.8 01/24/2018   No results found for: TSH Lab Results  Component Value Date   HGBA1C 8.0 10/10/2019   Lab Results  Component Value Date   WBC 8.2 05/31/2019   HGB 14.2 05/31/2019   HCT 42.0 05/31/2019   MCV 84.2 05/31/2019   PLT 235 05/31/2019   Lab Results  Component Value Date   ALT 17 05/31/2019   AST 36 05/31/2019   ALKPHOS 80 05/31/2019   BILITOT 0.8 05/31/2019     Review of Systems  Constitutional: Negative.  Negative for chills, diaphoresis, fatigue, fever and unexpected weight change.  HENT: Positive for congestion, sinus pressure and sneezing. Negative for ear discharge, ear pain, hoarse voice,  postnasal drip, rhinorrhea and sore throat.   Eyes: Negative for photophobia, pain, discharge, redness and itching.  Respiratory: Positive for cough and shortness of breath. Negative for wheezing and stridor.   Cardiovascular: Negative for chest pain, palpitations and leg swelling.  Gastrointestinal: Negative for abdominal pain, blood in stool, constipation, diarrhea, nausea and vomiting.  Endocrine: Negative for cold intolerance, heat intolerance, polydipsia, polyphagia and polyuria.  Genitourinary: Negative for dysuria, flank pain, frequency, hematuria, menstrual problem, pelvic pain, urgency, vaginal bleeding and vaginal discharge.  Musculoskeletal: Negative for arthralgias, back pain, myalgias and neck pain.  Skin: Negative for rash.  Allergic/Immunologic: Negative for environmental allergies and food allergies.  Neurological: Negative for dizziness, weakness, light-headedness, numbness and headaches.  Hematological: Negative for adenopathy. Does not bruise/bleed easily.  Psychiatric/Behavioral: Negative for dysphoric mood. The patient is not nervous/anxious.     Patient Active Problem List   Diagnosis Date Noted  . Essential hypertension 08/30/2017  . Gastroesophageal reflux disease 08/30/2017  . Aortic atherosclerosis (HCC) 05/31/2017  . PAD (peripheral artery disease) (HCC) 05/31/2017  . Encounter for long-term (current) use of high-risk medication 12/28/2016  . Bilateral hand pain 11/08/2016  . Elevated C-reactive protein 11/08/2016  . Swelling of joint of right hand 11/08/2016  . Type 2 diabetes mellitus with microalbuminuria, without long-term current use of insulin (HCC) 07/06/2016  . Acute anxiety 04/16/2016  . Bronchitis 04/16/2016  . Centrilobular emphysema (HCC) 04/16/2016  . TIA (  transient ischemic attack) 02/29/2016  . Lumbar stenosis with neurogenic claudication 04/16/2015  . Spondylosis of lumbar region without myelopathy or radiculopathy 04/16/2015  .  Hypertriglyceridemia 10/15/2014    Allergies  Allergen Reactions  . Betadine [Povidone Iodine] Swelling  . Codeine Itching  . Latex Itching and Swelling    (urinary catheter)  . Meloxicam Swelling  . Penicillins Other (See Comments)    Paralysis  Has patient had a PCN reaction causing immediate rash, facial/tongue/throat swelling, SOB or lightheadedness with hypotension: No Has patient had a PCN reaction causing severe rash involving mucus membranes or skin necrosis: No Has patient had a PCN reaction that required hospitalization Yes Has patient had a PCN reaction occurring within the last 10 years: No If all of the above answers are "NO", then may proceed with Cephalosporin use.    Past Surgical History:  Procedure Laterality Date  . ABDOMINAL HYSTERECTOMY    . ANTERIOR (CYSTOCELE) AND POSTERIOR REPAIR (RECTOCELE) WITH XENFORM GRAFT AND SACROSPINOUS FIXATION    . APPENDECTOMY    . BROW LIFT Bilateral 09/05/2018   Procedure: BLEPHAROPLASTY UPPER EYELID W/EXCESS SKIN;  Surgeon: Imagene Riches, MD;  Location: [facility];  Service: Ophthalmology;  Laterality: Bilateral;  . CTR Bilateral   . LUMBAR LAMINECTOMY/DECOMPRESSION MICRODISCECTOMY N/A 07/27/2017   Procedure: LUMBAR LAMINECTOMY/DECOMPRESSION MICRODISCECTOMY 3 LEVELS-L3-S1;  Surgeon: Venetia Night, MD;  Location: [facility];  Service: Neurosurgery;  Laterality: N/A;  . PTOSIS REPAIR Bilateral 09/05/2018   Procedure: PTOSIS REPAIR RESECT EX;  Surgeon: Imagene Riches, MD;  Location: [facility];  Service: Ophthalmology;  Laterality: Bilateral;  Diabetic - oral meds sleep apnea Latexd allergy  . ROTATOR CUFF REPAIR Right     Social History   Tobacco Use  . Smoking status: Former Smoker    Packs/day: 1.50    Years: 28.00    Pack years: 42.00    Types: Cigarettes    Quit date: 08/14/2000    Years since quitting: 19.7  . Smokeless tobacco: Never Used  . Tobacco comment: smoking cessatiuon materials not  required  Substance Use Topics  . Alcohol use: No  . Drug use: No     Medication list has been reviewed and updated.  Current Meds  Medication Sig  . albuterol (PROVENTIL HFA;VENTOLIN HFA) 108 (90 Base) MCG/ACT inhaler Inhale 2 puffs into the lungs every 6 (six) hours as needed for wheezing or shortness of breath.  Marland Kitchen albuterol (PROVENTIL) (2.5 MG/3ML) 0.083% nebulizer solution Take 3 mLs (2.5 mg total) by nebulization every 6 (six) hours as needed for wheezing or shortness of breath.  Marland Kitchen aspirin EC 81 MG tablet Take by mouth.  Marland Kitchen atorvastatin (LIPITOR) 40 MG tablet Take 1 tablet (40 mg total) by mouth daily.  . cyclobenzaprine (FLEXERIL) 10 MG tablet Take 1 tablet (10 mg total) by mouth 3 (three) times daily as needed for muscle spasms.  Marland Kitchen gemfibrozil (LOPID) 600 MG tablet Take 1 tablet (600 mg total) by mouth 2 (two) times daily.  Marland Kitchen glipiZIDE (GLUCOTROL) 5 MG tablet Take 5 mg by mouth every morning.  . hydrochlorothiazide (HYDRODIURIL) 25 MG tablet Take 1 tablet (25 mg total) by mouth daily.  . Investigational - Study Medication Study name: Diabetes medication - oral tablet once daily Additional study details: Dr. Dareen Piano with [employer]  . metFORMIN (GLUCOPHAGE) 500 MG tablet Take 500 mg by mouth 2 (two) times daily.  . metoprolol tartrate (LOPRESSOR) 25 MG tablet Take 1 tablet (25 mg total) by mouth 2 (two)  times daily.  . montelukast (SINGULAIR) 10 MG tablet Take 1 tablet (10 mg total) by mouth daily.  Marland Kitchen omeprazole (PRILOSEC) 20 MG capsule Take 20 mg by mouth daily. otc    PHQ 2/9 Scores 04/24/2020 02/06/2020 01/31/2020 03/09/2019  PHQ - 2 Score 0 0 0 0  PHQ- 9 Score 0 - 1 0    BP Readings from Last 3 Encounters:  04/24/20 130/80  01/31/20 120/80  10/10/19 124/78    Physical Exam Vitals and nursing note reviewed.  Constitutional:      General: She is not in acute distress.    Appearance: She is well-developed. She is not diaphoretic.  HENT:     Head: Normocephalic and  atraumatic.     Right Ear: Hearing, ear canal and external ear normal. Tympanic membrane is retracted.     Left Ear: Hearing, ear canal and external ear normal. Tympanic membrane is retracted.     Nose: Congestion and rhinorrhea present.     Right Turbinates: Swollen.     Left Turbinates: Swollen.     Right Sinus: Maxillary sinus tenderness present. No frontal sinus tenderness.     Left Sinus: Maxillary sinus tenderness present. No frontal sinus tenderness.     Mouth/Throat:     Lips: Pink.     Mouth: Mucous membranes are moist.  Eyes:     General: Lids are everted, no foreign bodies appreciated. No scleral icterus.       Right eye: No discharge.        Left eye: No foreign body, discharge or hordeolum.     Conjunctiva/sclera: Conjunctivae normal.     Right eye: Right conjunctiva is not injected.     Left eye: Left conjunctiva is not injected.     Pupils: Pupils are equal, round, and reactive to light.  Neck:     Thyroid: No thyromegaly.     Vascular: No JVD.     Trachea: No tracheal deviation.  Cardiovascular:     Rate and Rhythm: Normal rate and regular rhythm.     Heart sounds: Normal heart sounds. No murmur. No friction rub. No gallop.   Pulmonary:     Effort: Pulmonary effort is normal. No respiratory distress.     Breath sounds: Normal breath sounds. No wheezing or rales.  Abdominal:     General: Bowel sounds are normal.     Palpations: Abdomen is soft. There is no mass.     Tenderness: There is no abdominal tenderness. There is no guarding or rebound.  Musculoskeletal:        General: No tenderness. Normal range of motion.     Cervical back: Normal range of motion and neck supple.  Lymphadenopathy:     Cervical: No cervical adenopathy.  Skin:    General: Skin is warm and dry.     Findings: No rash.  Neurological:     Mental Status: She is alert and oriented to person, place, and time.     Cranial Nerves: No cranial nerve deficit.     Deep Tendon Reflexes: Reflexes  are normal and symmetric. Reflexes normal.  Psychiatric:        Mood and Affect: Mood is not anxious or depressed.     Wt Readings from Last 3 Encounters:  04/24/20 141 lb (64 kg)  01/31/20 143 lb (64.9 kg)  10/10/19 147 lb (66.7 kg)    BP 130/80   Pulse 68   Temp 98.7 F (37.1 C) (Oral)   Ht 5'  2" (1.575 m)   Wt 141 lb (64 kg)   SpO2 98%   BMI 25.79 kg/m    Assessment and Plan: 1. COPD with acute bronchitis (HCC) Acute on chronic exacerbation of longstanding COPD.  Chronic.  Longstanding.  An acute exacerbation patient is already on steroid and we will not increase however with the productive cough that she yellow we will cover for secondary infection with Levaquin 500 mg once a day.  Patient also has cough for which we will treat with Robitussin-AC. - levofloxacin (LEVAQUIN) 500 MG tablet; Take 1 tablet (500 mg total) by mouth daily.  Dispense: 7 tablet; Refill: 0  2. Acute maxillary sinusitis, recurrence not specified Chronic.  Controlled.  Stable.  Tenderness over the maxillary sinuses bilateral.  Will initiate Levaquin 500 mg once a day. - levofloxacin (LEVAQUIN) 500 MG tablet; Take 1 tablet (500 mg total) by mouth daily.  Dispense: 7 tablet; Refill: 0  3. Breast cancer screening by mammogram Discussed and administered and scheduled for 3D screening bilateral. - MM 3D SCREEN BREAST BILATERAL; Future

## 2020-04-30 ENCOUNTER — Telehealth: Payer: Self-pay | Admitting: Family Medicine

## 2020-04-30 ENCOUNTER — Ambulatory Visit: Payer: Self-pay | Admitting: *Deleted

## 2020-04-30 NOTE — Telephone Encounter (Signed)
Returned call to patient. See nurse triage note on 04/30/20.

## 2020-04-30 NOTE — Telephone Encounter (Signed)
Pt called originally stating that she still has chest congestion that is just now breaking up, and she is feeling drainage.  Pt would like another round of abx, she feels like she needs in order to get over her bronchitis.   Returned call to patient.Patient calling to request additional antibiotics due to feeling like she still has chest and nasal congestion. Pt recently had visit on 5/20 and was treated for bronchitis. Pt was prescribed Levaquin and completed course on today. Pt states that she still has a cough at night and has very light yellow drainage. Patient states that she has improved but is requesting an additional round of antibiotic to clear up bronchitis.  Reason for Disposition . [1] Caller has NON-URGENT question (includes prescribed medication questions) AND [2] triager unable to answer  Answer Assessment - Initial Assessment Questions 1. MAIN CONCERN OR SYMPTOM:  "What is your main concern right now?" "What question do you have?" "What's the main symptom you're worried about?" (e.g., breathing difficulty, cough, fever. pain)     Feeling stopped up at night with nasal and chest congestion. Increased cough at night 2. ONSET: "When did the  congestion  start?"     Recently seen for bronchitis on 5/20 3. BETTER-SAME-WORSE: "Are you getting better, staying the same, or getting worse compared to how you felt at your last visit to the doctor (most recent medical visit)"?     Has improved 4. VISIT DATE: "When were you seen?" (Date)     04/24/20 5. VISIT DOCTOR: "What is the name of the doctor taking care of you now?"     Dr. Yetta Barre 6. VISIT DIAGNOSIS:  "What was the main symptom or problem that you were seen for?" "Were you given a diagnosis?"      COPD/Bronchitis 7. VISIT MEDICATIONS: "Did the physician order any new medicines for you to use?" If yes: "Have you filled the prescription and started taking the medicine?"      Levaquin and completed course on today, 5/26 8. NEXT  APPOINTMENT: "Have you scheduled a follow-up appointment with your doctor?"     No f/u appt scheduled at this time 9. PAIN: "Is there any pain?" If so, ask: "How bad is it?"  (Scale 1-10; or mild, moderate, severe)     No complaints of pain voiced at this time 10. FEVER: "Do you have a fever?" If so, ask: "What is it, how was it measured  and when did it start?"       No fever 11. OTHER SYMPTOMS: "Do you have any other symptoms?"       No  Protocols used: RECENT MEDICAL VISIT FOR ILLNESS FOLLOW-UP CALL-A-AH

## 2020-04-30 NOTE — Telephone Encounter (Signed)
Pt states her chest congestion is just now breaking up, and she is feeling drainage.  Pt would like another round of abx, she feels like she needs in order to get over her bronchitis.   [id number] - [place], [place] - [address] Phone:  [phone]  Fax:  [phone]

## 2020-05-01 ENCOUNTER — Telehealth: Payer: Self-pay | Admitting: Family Medicine

## 2020-05-01 DIAGNOSIS — Z9889 Other specified postprocedural states: Secondary | ICD-10-CM | POA: Diagnosis not present

## 2020-05-01 DIAGNOSIS — M545 Low back pain: Secondary | ICD-10-CM | POA: Diagnosis not present

## 2020-05-01 DIAGNOSIS — R809 Proteinuria, unspecified: Secondary | ICD-10-CM | POA: Diagnosis not present

## 2020-05-01 DIAGNOSIS — M48062 Spinal stenosis, lumbar region with neurogenic claudication: Secondary | ICD-10-CM | POA: Diagnosis not present

## 2020-05-01 DIAGNOSIS — M5441 Lumbago with sciatica, right side: Secondary | ICD-10-CM | POA: Diagnosis not present

## 2020-05-01 DIAGNOSIS — E1129 Type 2 diabetes mellitus with other diabetic kidney complication: Secondary | ICD-10-CM | POA: Diagnosis not present

## 2020-05-01 NOTE — Telephone Encounter (Signed)
Front spoke to pt concerning appt

## 2020-05-01 NOTE — Telephone Encounter (Signed)
Copied from CRM [id number]. Topic: General - Other >> May 01, 2020 10:01 AM Lyn Hollingshead D wrote: Reason for CRM: PT is very upset, wants to speak with Delice Bison directly , decline leave a message

## 2020-05-01 NOTE — Telephone Encounter (Signed)
Spoke to pt

## 2020-05-02 ENCOUNTER — Ambulatory Visit
Admission: RE | Admit: 2020-05-02 | Discharge: 2020-05-02 | Disposition: A | Discharge status: Home / Self Care | Payer: Medicare HMO | Attending: Family Medicine | Admitting: Family Medicine

## 2020-05-02 ENCOUNTER — Encounter: Payer: Self-pay | Admitting: Family Medicine

## 2020-05-02 ENCOUNTER — Ambulatory Visit (INDEPENDENT_AMBULATORY_CARE_PROVIDER_SITE_OTHER): Payer: Medicare HMO | Admitting: Family Medicine

## 2020-05-02 ENCOUNTER — Other Ambulatory Visit: Payer: Self-pay

## 2020-05-02 ENCOUNTER — Ambulatory Visit
Admission: RE | Admit: 2020-05-02 | Discharge: 2020-05-02 | Disposition: A | Discharge status: Home / Self Care | Payer: Medicare HMO | Source: Ambulatory Visit | Attending: Family Medicine | Admitting: Family Medicine

## 2020-05-02 VITALS — BP 120/76 | HR 84 | Temp 98.0°F | Ht 62.0 in | Wt 143.0 lb

## 2020-05-02 DIAGNOSIS — J440 Chronic obstructive pulmonary disease with acute lower respiratory infection: Secondary | ICD-10-CM

## 2020-05-02 DIAGNOSIS — R05 Cough: Secondary | ICD-10-CM | POA: Diagnosis not present

## 2020-05-02 DIAGNOSIS — J209 Acute bronchitis, unspecified: Secondary | ICD-10-CM | POA: Insufficient documentation

## 2020-05-02 MED ORDER — PREDNISONE 10 MG PO TABS: 10.0000 mg | ORAL_TABLET | Freq: Every day | ORAL | 0 refills | Status: DC

## 2020-05-02 NOTE — Progress Notes (Signed)
Date:  05/02/2020   Name:  Judy Bolton   DOB:  28-Jan-1946   MRN:  [id number]   Chief Complaint: Follow-up (finished Levo 500mg  qday x 7 days yesterday- still having congestion)  Cough This is a chronic problem. The current episode started more than 1 year ago. The problem has been waxing and waning. The cough is productive of purulent sputum (yellow). Associated symptoms include shortness of breath. Pertinent negatives include no chest pain, chills, ear congestion, ear pain, eye redness, fever, headaches, heartburn, hemoptysis, myalgias, nasal congestion, postnasal drip, rash, rhinorrhea, sore throat, sweats, weight loss or wheezing. She has tried oral steroids for the symptoms. The treatment provided moderate relief. There is no history of asthma, bronchiectasis, bronchitis, COPD, emphysema, environmental allergies or pneumonia.  Shortness of Breath This is a chronic problem. The current episode started more than 1 year ago. The problem occurs intermittently. The problem has been waxing and waning. Pertinent negatives include no abdominal pain, chest pain, claudication, ear pain, fever, headaches, hemoptysis, leg pain, leg swelling, rash, rhinorrhea, sore throat, sputum production, vomiting or wheezing. There is no history of asthma, COPD or pneumonia.    Lab Results  Component Value Date   CREATININE 1.22 (H) 10/03/2019   BUN 33 (H) 05/31/2019   NA 135 05/31/2019   K 4.3 05/31/2019   CL 97 (L) 05/31/2019   CO2 22 05/31/2019   Lab Results  Component Value Date   CHOL 185 01/24/2018   HDL 66 01/24/2018   LDLCALC 88 01/24/2018   TRIG 154 (H) 01/24/2018   CHOLHDL 2.8 01/24/2018   No results found for: TSH Lab Results  Component Value Date   HGBA1C 8.0 10/10/2019   Lab Results  Component Value Date   WBC 8.2 05/31/2019   HGB 14.2 05/31/2019   HCT 42.0 05/31/2019   MCV 84.2 05/31/2019   PLT 235 05/31/2019   Lab Results  Component Value Date   ALT 17 05/31/2019   AST  36 05/31/2019   ALKPHOS 80 05/31/2019   BILITOT 0.8 05/31/2019     Review of Systems  Constitutional: Negative.  Negative for chills, fatigue, fever, unexpected weight change and weight loss.  HENT: Negative for congestion, ear discharge, ear pain, postnasal drip, rhinorrhea, sinus pressure, sneezing and sore throat.   Eyes: Negative for photophobia, pain, discharge, redness and itching.  Respiratory: Positive for cough and shortness of breath. Negative for hemoptysis, sputum production, wheezing and stridor.   Cardiovascular: Negative for chest pain, claudication and leg swelling.  Gastrointestinal: Negative for abdominal pain, blood in stool, constipation, diarrhea, heartburn, nausea and vomiting.  Endocrine: Negative for cold intolerance, heat intolerance, polydipsia, polyphagia and polyuria.  Genitourinary: Negative for dysuria, flank pain, frequency, hematuria, menstrual problem, pelvic pain, urgency, vaginal bleeding and vaginal discharge.  Musculoskeletal: Negative for arthralgias, back pain and myalgias.  Skin: Negative for rash.  Allergic/Immunologic: Negative for environmental allergies and food allergies.  Neurological: Negative for dizziness, weakness, light-headedness, numbness and headaches.  Hematological: Negative for adenopathy. Does not bruise/bleed easily.  Psychiatric/Behavioral: Negative for dysphoric mood. The patient is not nervous/anxious.     Patient Active Problem List   Diagnosis Date Noted  . Essential hypertension 08/30/2017  . Gastroesophageal reflux disease 08/30/2017  . Aortic atherosclerosis (HCC) 05/31/2017  . PAD (peripheral artery disease) (HCC) 05/31/2017  . Encounter for long-term (current) use of high-risk medication 12/28/2016  . Bilateral hand pain 11/08/2016  . Elevated C-reactive protein 11/08/2016  . Swelling of joint of right  hand 11/08/2016  . Type 2 diabetes mellitus with microalbuminuria, without long-term current use of insulin (HCC)  07/06/2016  . Acute anxiety 04/16/2016  . Bronchitis 04/16/2016  . Centrilobular emphysema (HCC) 04/16/2016  . TIA (transient ischemic attack) 02/29/2016  . Lumbar stenosis with neurogenic claudication 04/16/2015  . Spondylosis of lumbar region without myelopathy or radiculopathy 04/16/2015  . Hypertriglyceridemia 10/15/2014    Allergies  Allergen Reactions  . Betadine [Povidone Iodine] Swelling  . Codeine Itching  . Latex Itching and Swelling    (urinary catheter)  . Meloxicam Swelling  . Penicillins Other (See Comments)    Paralysis  Has patient had a PCN reaction causing immediate rash, facial/tongue/throat swelling, SOB or lightheadedness with hypotension: No Has patient had a PCN reaction causing severe rash involving mucus membranes or skin necrosis: No Has patient had a PCN reaction that required hospitalization Yes Has patient had a PCN reaction occurring within the last 10 years: No If all of the above answers are "NO", then may proceed with Cephalosporin use.    Past Surgical History:  Procedure Laterality Date  . ABDOMINAL HYSTERECTOMY    . ANTERIOR (CYSTOCELE) AND POSTERIOR REPAIR (RECTOCELE) WITH XENFORM GRAFT AND SACROSPINOUS FIXATION    . APPENDECTOMY    . BROW LIFT Bilateral 09/05/2018   Procedure: BLEPHAROPLASTY UPPER EYELID W/EXCESS SKIN;  Surgeon: Imagene Riches, MD;  Location: [facility];  Service: Ophthalmology;  Laterality: Bilateral;  . CTR Bilateral   . LUMBAR LAMINECTOMY/DECOMPRESSION MICRODISCECTOMY N/A 07/27/2017   Procedure: LUMBAR LAMINECTOMY/DECOMPRESSION MICRODISCECTOMY 3 LEVELS-L3-S1;  Surgeon: Venetia Night, MD;  Location: [facility];  Service: Neurosurgery;  Laterality: N/A;  . PTOSIS REPAIR Bilateral 09/05/2018   Procedure: PTOSIS REPAIR RESECT EX;  Surgeon: Imagene Riches, MD;  Location: [facility];  Service: Ophthalmology;  Laterality: Bilateral;  Diabetic - oral meds sleep apnea Latexd allergy  . ROTATOR CUFF REPAIR Right       Social History   Tobacco Use  . Smoking status: Former Smoker    Packs/day: 1.50    Years: 28.00    Pack years: 42.00    Types: Cigarettes    Quit date: 08/14/2000    Years since quitting: 19.7  . Smokeless tobacco: Never Used  . Tobacco comment: smoking cessatiuon materials not required  Substance Use Topics  . Alcohol use: No  . Drug use: No     Medication list has been reviewed and updated.  Current Meds  Medication Sig  . albuterol (PROVENTIL HFA;VENTOLIN HFA) 108 (90 Base) MCG/ACT inhaler Inhale 2 puffs into the lungs every 6 (six) hours as needed for wheezing or shortness of breath.  Marland Kitchen albuterol (PROVENTIL) (2.5 MG/3ML) 0.083% nebulizer solution Take 3 mLs (2.5 mg total) by nebulization every 6 (six) hours as needed for wheezing or shortness of breath.  Marland Kitchen aspirin EC 81 MG tablet Take by mouth.  Marland Kitchen atorvastatin (LIPITOR) 40 MG tablet Take 1 tablet (40 mg total) by mouth daily.  . celecoxib (CELEBREX) 200 MG capsule Take 1 capsule by mouth daily.  . cyclobenzaprine (FLEXERIL) 10 MG tablet Take 1 tablet (10 mg total) by mouth 3 (three) times daily as needed for muscle spasms.  Marland Kitchen gemfibrozil (LOPID) 600 MG tablet Take 1 tablet (600 mg total) by mouth 2 (two) times daily.  Marland Kitchen glipiZIDE (GLUCOTROL) 5 MG tablet Take 5 mg by mouth every morning.  . hydrochlorothiazide (HYDRODIURIL) 25 MG tablet Take 1 tablet (25 mg total) by mouth daily.  . Investigational - Study Medication Study  name: Diabetes medication - oral tablet once daily Additional study details: Dr. Dareen Piano with [facility]  . ipratropium (ATROVENT) 0.02 % nebulizer solution Inhale 2.5 mLs into the lungs 4 (four) times daily.  . metFORMIN (GLUCOPHAGE) 500 MG tablet Take 500 mg by mouth 2 (two) times daily.  . metoprolol tartrate (LOPRESSOR) 25 MG tablet Take 1 tablet (25 mg total) by mouth 2 (two) times daily.  . montelukast (SINGULAIR) 10 MG tablet Take 1 tablet (10 mg total) by mouth daily.  Marland Kitchen omeprazole (PRILOSEC)  20 MG capsule Take 20 mg by mouth daily. otc    PHQ 2/9 Scores 05/02/2020 04/24/2020 02/06/2020 01/31/2020  PHQ - 2 Score 0 0 0 0  PHQ- 9 Score 0 0 - 1    BP Readings from Last 3 Encounters:  05/02/20 120/76  04/24/20 130/80  01/31/20 120/80    Physical Exam Vitals and nursing note reviewed.  Constitutional:      General: She is not in acute distress.    Appearance: She is not diaphoretic.  HENT:     Head: Normocephalic and atraumatic.     Right Ear: Tympanic membrane, ear canal and external ear normal.     Left Ear: Tympanic membrane, ear canal and external ear normal.     Nose: Nose normal.  Eyes:     General:        Right eye: No discharge.        Left eye: No discharge.     Conjunctiva/sclera: Conjunctivae normal.     Pupils: Pupils are equal, round, and reactive to light.  Neck:     Thyroid: No thyromegaly.     Vascular: No JVD.  Cardiovascular:     Rate and Rhythm: Normal rate and regular rhythm.     Heart sounds: Normal heart sounds. No murmur. No friction rub. No gallop.   Pulmonary:     Effort: Pulmonary effort is normal.     Breath sounds: No stridor. Wheezing present. No rhonchi or rales.  Abdominal:     General: Bowel sounds are normal.     Palpations: Abdomen is soft. There is no mass.     Tenderness: There is no abdominal tenderness. There is no guarding.  Musculoskeletal:        General: Normal range of motion.     Cervical back: Normal range of motion and neck supple.  Lymphadenopathy:     Cervical: No cervical adenopathy.  Skin:    General: Skin is warm and dry.  Neurological:     Mental Status: She is alert.     Deep Tendon Reflexes: Reflexes are normal and symmetric.     Wt Readings from Last 3 Encounters:  05/02/20 143 lb (64.9 kg)  04/24/20 141 lb (64 kg)  01/31/20 143 lb (64.9 kg)    BP 120/76   Pulse 84   Temp 98 F (36.7 C) (Oral)   Ht 5\' 2"  (1.575 m)   Wt 143 lb (64.9 kg)   SpO2 98%   BMI 26.16 kg/m   Assessment and  Plan:  1. COPD with acute bronchitis [facility]) Patient with follow-up of her COPD with acute exacerbation and bronchitis.  Cough and wheezing have improved but continued to persist.  Patient is on baseline prednisone 10 mg a day and will run of that to a tapering dose at 40 mg over a 12-day..  We will obtain a chest x-ray at this time given that the coughing and the dyspnea has not totally dissipated.  Patient was given a sample of Trelegy to take in the morning along with her Ventolin. - DG Chest 2 View; Future - predniSONE (DELTASONE) 10 MG tablet; Take 1 tablet (10 mg total) by mouth daily with breakfast. Taper 4,4,4,3,3,3,2,2,2,1,1,1.  Dispense: 30 tablet; Refill: 0

## 2020-05-20 DIAGNOSIS — M5126 Other intervertebral disc displacement, lumbar region: Secondary | ICD-10-CM | POA: Diagnosis not present

## 2020-05-20 DIAGNOSIS — M48062 Spinal stenosis, lumbar region with neurogenic claudication: Secondary | ICD-10-CM | POA: Diagnosis not present

## 2020-05-20 DIAGNOSIS — M5416 Radiculopathy, lumbar region: Secondary | ICD-10-CM | POA: Diagnosis not present

## 2020-05-29 ENCOUNTER — Other Ambulatory Visit (INDEPENDENT_AMBULATORY_CARE_PROVIDER_SITE_OTHER): Payer: Self-pay | Admitting: Vascular Surgery

## 2020-05-29 DIAGNOSIS — I739 Peripheral vascular disease, unspecified: Secondary | ICD-10-CM

## 2020-06-11 DIAGNOSIS — R0600 Dyspnea, unspecified: Secondary | ICD-10-CM | POA: Diagnosis not present

## 2020-06-11 DIAGNOSIS — J310 Chronic rhinitis: Secondary | ICD-10-CM | POA: Diagnosis not present

## 2020-06-11 DIAGNOSIS — J439 Emphysema, unspecified: Secondary | ICD-10-CM | POA: Diagnosis not present

## 2020-06-24 LAB — HEMOGLOBIN A1C: Hemoglobin A1C: 8.9

## 2020-06-30 ENCOUNTER — Other Ambulatory Visit: Payer: Self-pay | Admitting: Family Medicine

## 2020-06-30 DIAGNOSIS — J209 Acute bronchitis, unspecified: Secondary | ICD-10-CM

## 2020-06-30 DIAGNOSIS — J440 Chronic obstructive pulmonary disease with acute lower respiratory infection: Secondary | ICD-10-CM

## 2020-07-17 ENCOUNTER — Telehealth: Payer: Self-pay | Admitting: *Deleted

## 2020-07-17 NOTE — Chronic Care Management (AMB) (Signed)
  Chronic Care Management   Outreach Note  07/17/2020 Name: Judy Bolton MRN: [id number] DOB: 01/25/1946  Judy Bolton is a 74 y.o. year old female who is a primary care patient of Duanne Limerick, MD. I reached out to [name] by phone today in response to a referral sent by Ms. [employer]'s health plan.     An unsuccessful telephone outreach was attempted today. The patient was referred to the case management team for assistance with care management and care coordination.   Follow Up Plan: A HIPPA compliant phone message was left for the patient providing contact information and requesting a return call. The care management team will reach out to the patient again over the next 7 days. If patient returns call to provider office, please advise to call Embedded Care Management Care Guide Gwenevere Ghazi at [phone].  Gwenevere Ghazi  Care Guide, Embedded Care Coordination [facility]  [place], [place] [address] Direct Dial: [phone] [email or web address] Website: [email or web address]

## 2020-07-23 NOTE — Chronic Care Management (AMB) (Signed)
  Chronic Care Management   Outreach Note  07/23/2020 Name: Judy Bolton MRN: [id number] DOB: Dec 24, 1945  Judy Bolton is a 74 y.o. year old female who is a primary care patient of Judy Limerick, MD. I reached out to Judy Bolton by phone today in response to a referral sent by Judy Bolton's health plan.     A second unsuccessful telephone outreach was attempted today. The patient was referred to the case management team for assistance with care management and care coordination.   Follow Up Plan: A HIPPA compliant phone message was left for the patient providing contact information and requesting a return call. The care management team will reach out to the patient again over the next 7 days. If patient returns call to provider office, please advise to call Embedded Care Management Care Guide Judy Bolton at [phone].  Judy Bolton  Care Guide, Embedded Care Coordination [facility]  [place], [place] [address] Direct Dial: [phone] Judy Bolton Website: [email or web address]

## 2020-07-30 ENCOUNTER — Other Ambulatory Visit: Payer: Self-pay | Admitting: Family Medicine

## 2020-07-30 DIAGNOSIS — E785 Hyperlipidemia, unspecified: Secondary | ICD-10-CM

## 2020-07-30 NOTE — Chronic Care Management (AMB) (Signed)
  Chronic Care Management   Note  07/30/2020 Name: DORINE DUFFEY MRN: [id number] DOB: [birth year]  Judy Bolton is a 74 y.o. year old female who is a primary care patient of Juline Patch, MD. I reached out to [name] by phone today in response to a referral sent by Ms. Alcar[employer]'s health plan.     Ms. Kittle was given information about Chronic Care Management services today including:  1. CCM service includes personalized support from designated clinical staff supervised by her physician, including individualized plan of care and coordination with other care providers 2. 24/7 contact phone numbers for assistance for urgent and routine care needs. 3. Service will only be billed when office clinical staff spend 20 minutes or more in a month to coordinate care. 4. Only one practitioner may furnish and bill the service in a calendar month. 5. The patient may stop CCM services at any time (effective at the end of the month) by phone call to the office staff. 6. The patient will be responsible for cost sharing (co-pay) of up to 20% of the service fee (after annual deductible is met).  Patient agreed to services and verbal consent obtained.   Follow up plan: Telephone appointment with care management team member scheduled for: 07/31/2020  [place], [place], [place] [address] Direct Dial: [email or web address] Website: [email or web address]

## 2020-07-30 NOTE — Telephone Encounter (Signed)
Requested medications are due for refill today?  Yes  Requested medications are on active medication list?  Yes  Last Refill:   01/31/2020 # 180 with one refill   Future visit scheduled?  No   Notes to Clinic:  Medications failed Rx refill protocol due to no labs within past 180 / 360 days.  In Care Everywhere a lipid panel was performed on 07/05/2019 and a chemistry panel on 10/10/2019.  AST and ALT were WNL, creatinine was 1.3, GFR 40.

## 2020-07-31 ENCOUNTER — Telehealth: Payer: Medicare HMO

## 2020-07-31 ENCOUNTER — Other Ambulatory Visit: Payer: Self-pay

## 2020-07-31 ENCOUNTER — Ambulatory Visit (INDEPENDENT_AMBULATORY_CARE_PROVIDER_SITE_OTHER): Payer: Medicare HMO | Admitting: Family Medicine

## 2020-07-31 ENCOUNTER — Encounter: Payer: Self-pay | Admitting: Family Medicine

## 2020-07-31 DIAGNOSIS — E785 Hyperlipidemia, unspecified: Secondary | ICD-10-CM | POA: Diagnosis not present

## 2020-07-31 DIAGNOSIS — I700 Atherosclerosis of aorta: Secondary | ICD-10-CM | POA: Diagnosis not present

## 2020-07-31 DIAGNOSIS — I10 Essential (primary) hypertension: Secondary | ICD-10-CM

## 2020-07-31 DIAGNOSIS — E1129 Type 2 diabetes mellitus with other diabetic kidney complication: Secondary | ICD-10-CM | POA: Diagnosis not present

## 2020-07-31 DIAGNOSIS — R809 Proteinuria, unspecified: Secondary | ICD-10-CM | POA: Diagnosis not present

## 2020-07-31 MED ORDER — ATORVASTATIN CALCIUM 40 MG PO TABS: 40.0000 mg | ORAL_TABLET | Freq: Every day | ORAL | 1 refills | Status: DC

## 2020-07-31 MED ORDER — HYDROCHLOROTHIAZIDE 25 MG PO TABS: 25.0000 mg | ORAL_TABLET | Freq: Every day | ORAL | 1 refills | Status: DC

## 2020-07-31 MED ORDER — METOPROLOL TARTRATE 25 MG PO TABS: 25.0000 mg | ORAL_TABLET | Freq: Two times a day (BID) | ORAL | 1 refills | Status: DC

## 2020-07-31 MED ORDER — GEMFIBROZIL 600 MG PO TABS: 600.0000 mg | ORAL_TABLET | Freq: Two times a day (BID) | ORAL | 1 refills | Status: DC

## 2020-07-31 NOTE — Progress Notes (Deleted)
Chronic Care Management Pharmacy  Name: Judy Bolton  MRN: [id number] DOB: 05-16-1946   Chief Complaint/ HPI  Judy Bolton,  74 y.o. , female presents for their Initial CCM visit with the clinical pharmacist via telephone due to COVID-19 Pandemic.  PCP : Juline Patch, MD Patient Care Team: Juline Patch, MD as PCP - General (Family Medicine) Erby Pian, MD as Consulting Physician (Specialist) Vladimir Faster, [facility] (Pharmacist)  Their chronic conditions include: Hypertension, Hyperlipidemia, Diabetes, Coronary Artery Disease, GERD and Lumbar stenosis   Office Visits: 07/31/20 - Dr. Ronnald Ramp - A1c repeat  Consult Visit: 06/11/20- Dr. Raul Del, Pulmonolgy - pred 56m 05/20/20 -  10/17/19 -   Allergies  Allergen Reactions  . Betadine [Povidone Iodine] Swelling  . Codeine Itching  . Latex Itching and Swelling    (urinary catheter)  . Meloxicam Swelling  . Penicillins Other (See Comments)    Paralysis  Has patient had a PCN reaction causing immediate rash, facial/tongue/throat swelling, SOB or lightheadedness with hypotension: No Has patient had a PCN reaction causing severe rash involving mucus membranes or skin necrosis: No Has patient had a PCN reaction that required hospitalization Yes Has patient had a PCN reaction occurring within the last 10 years: No If all of the above answers are "NO", then may proceed with Cephalosporin use.    Medications: Outpatient Encounter Medications as of 07/31/2020  Medication Sig  . albuterol (PROVENTIL HFA;VENTOLIN HFA) 108 (90 Base) MCG/ACT inhaler Inhale 2 puffs into the lungs every 6 (six) hours as needed for wheezing or shortness of breath.  .Marland Kitchenalbuterol (PROVENTIL) (2.5 MG/3ML) 0.083% nebulizer solution Take 3 mLs (2.5 mg total) by nebulization every 6 (six) hours as needed for wheezing or shortness of breath.  .Marland Kitchenaspirin EC 81 MG tablet Take by mouth.  .Marland Kitchenatorvastatin (LIPITOR) 40 MG tablet Take 1 tablet (40 mg total) by  mouth daily.  . cyclobenzaprine (FLEXERIL) 10 MG tablet Take 1 tablet (10 mg total) by mouth 3 (three) times daily as needed for muscle spasms.  .Marland Kitchengabapentin (NEURONTIN) 300 MG capsule Take 1 capsule by mouth at bedtime. mundy  . gemfibrozil (LOPID) 600 MG tablet Take 1 tablet (600 mg total) by mouth 2 (two) times daily.  .Marland KitchenglipiZIDE (GLUCOTROL) 5 MG tablet Take 5 mg by mouth every morning.  . hydrochlorothiazide (HYDRODIURIL) 25 MG tablet Take 1 tablet (25 mg total) by mouth daily.  . Investigational - Study Medication Study name: Diabetes medication - oral tablet once daily Additional study details: Dr. [place] . metFORMIN (GLUCOPHAGE) 500 MG tablet Take 500 mg by mouth 2 (two) times daily.  . metoprolol tartrate (LOPRESSOR) 25 MG tablet Take 1 tablet (25 mg total) by mouth 2 (two) times daily.  . montelukast (SINGULAIR) 10 MG tablet Take 1 tablet (10 mg total) by mouth daily.  .Marland Kitchenomeprazole (PRILOSEC) 20 MG capsule Take 20 mg by mouth daily. otc  . predniSONE (DELTASONE) 10 MG tablet Take 1 tablet (10 mg total) by mouth daily with breakfast. Taper 4,4,4,3,3,3,2,2,2,1,1,1.   No facility-administered encounter medications on file as of 07/31/2020.     Current Diagnosis/Assessment:    Goals Addressed   None    Diabetes   A1c goal <7%  Recent Relevant Labs: Lab Results  Component Value Date/Time   HGBA1C 8.9 06/24/2020 12:00 AM   HGBA1C 8.0 10/10/2019 12:00 AM   MICROALBUR didn't perform 08/30/2017 12:00 AM    BMP Latest Ref Rng &  Units 10/03/2019 05/31/2019 01/24/2018  Glucose 70 - 99 mg/dL - 143(H) 130(H)  BUN 8 - 23 mg/dL - 33(H) 33(H)  Creatinine 0.44 - 1.00 mg/dL 1.22(H) 1.51(H) 1.45(H)  BUN/Creat Ratio 12 - 28 - - 23  Sodium 135 - 145 mmol/L - 135 141  Potassium 3.5 - 5.1 mmol/L - 4.3 5.0  Chloride 98 - 111 mmol/L - 97(L) 103  CO2 22 - 32 mmol/L - 22 20  Calcium 8.9 - 10.3 mg/dL - 9.6 9.9    Last diabetic Eye exam:  Lab Results  Component Value  Date/Time   HMDIABEYEEXA No Retinopathy 12/18/2019 12:00 AM    Last diabetic Foot exam: No results found for: HMDIABFOOTEX   Checking BG: {CHL HP Blood Glucose Monitoring [phone]}  Recent FBG Readings: *** Recent pre-meal BG readings: *** Recent 2hr PP BG readings:  *** Recent HS BG readings: ***  Patient has failed these meds in past: *** Patient is currently {CHL Controlled/[phone]} on the following medications: Marland Kitchen Glipizide 5 mg qam . Metformin 500 mg bid . Study drug? . ? Voctoza 1.8?  We discussed: {CHL HP Upstream Pharmacy [phone]}  Plan  Continue {CHL HP Upstream Pharmacy Plans:[phone]}  Hypertension   BP goal is:  <130/80  Office blood pressures are  BP Readings from Last 3 Encounters:  07/31/20 138/80  05/02/20 120/76  04/24/20 130/80   Patient checks BP at home {CHL HP BP Monitoring Frequency:[phone]} Patient home BP readings are ranging: ***  Patient has failed these meds in the past: *** Patient is currently {CHL Controlled/[phone]} on the following medications:  . Hydrochlorothiazide 25 mg qd . Metoprolol tartrate 25 mg bid  We discussed {CHL HP Upstream Pharmacy [phone]}  Plan  Continue {CHL HP Upstream Pharmacy Plans:[phone]}      Hyperlipidemia/CAD/ h/o TIA   LDL goal <70  Lipid Panel     Component Value Date/Time   CHOL 185 01/24/2018 1052   TRIG 154 (H) 01/24/2018 1052   HDL 66 01/24/2018 1052   LDLCALC 88 01/24/2018 1052    Hepatic Function Latest Ref Rng & Units 05/31/2019 01/24/2018 01/07/2018  Total Protein 6.5 - 8.1 g/dL 8.0 - 7.4  Albumin 3.5 - 5.0 g/dL 3.5 4.8 4.0  AST 15 - 41 U/L 36 - 24  ALT 0 - 44 U/L 17 - 14  Alk Phosphatase 38 - 126 U/L 80 - 76  Total Bilirubin 0.3 - 1.2 mg/dL 0.8 - 0.7     The 10-year ASCVD risk score Mikey Bussing DC Jr., et al., 2013) is: 27.6%   Values used to calculate the score:     Age: 76 years     Sex: Female     Is  Non-Hispanic African American: No     Diabetic: Yes     Tobacco smoker: No     Systolic Blood Pressure: 283 mmHg     Is BP treated: Yes     HDL Cholesterol: 66 mg/dL     Total Cholesterol: 185 mg/dL   Patient has failed these meds in past: *** Patient is currently {CHL Controlled/[phone]} on the following medications:  . Atorvastatin 40 mg  . Gemfibrozil 680m bid . Aspirin 81 mg   We discussed:  {CHL HP Upstream Pharmacy [phone]}  Plan  Continue {CHL HP Upstream Pharmacy Plans:[phone]}   COPD / emphysema / Tobacco   Last spirometry score: ***  Gold Grade: {CHL HP Upstream Pharm COPD Gold GMOQHU:[id number]}Current COPD Classification:  {CHL HP Upstream Pharm COPD Classification:[phone]}  Eosinophil  count:   Lab Results  Component Value Date/Time   EOSPCT 12 02/29/2016 07:12 PM  %                               Eos (Absolute):  Lab Results  Component Value Date/Time   EOSABS 1.2 (H) 02/29/2016 07:12 PM    Tobacco Status:  Social History   Tobacco Use  Smoking Status Former Smoker  . Packs/day: 1.50  . Years: 28.00  . Pack years: 42.00  . Types: Cigarettes  . Quit date: 08/14/2000  . Years since quitting: 19.9  Smokeless Tobacco Never Used  Tobacco Comment   smoking cessatiuon materials not required    Patient has failed these meds in past: *** Patient is currently {CHL Controlled/[phone]} on the following medications:   Albuterol   Montelukast 10 mg qd  ?symbicort  ? Prednisone 5 mg  Using maintenance inhaler regularly? {yes/no:20286} Frequency of rescue inhaler use:  {CHL HP Upstream Pharm Inhaler IOMB:[id number]}  We discussed:  {CHL HP Upstream Pharmacy discussion:[phone]}  Plan  Continue {CHL HP Upstream Pharmacy Plans:[phone]}  GERD   Patient has failed these meds in past: *** Patient is currently {CHL Controlled/[phone]} on the following medications:  . Omeprazole  20 mg qd  We discussed:  ***  Plan  Continue {CHL HP Upstream Pharmacy Plans:[phone]}  Lumbar stenosis   Patient has failed these meds in past: *** Patient is currently {CHL Controlled/[phone]} on the following medications:  . Cyclobenzaprine 10 mg  . Gabapentin 323m hs .   We discussed:  ***  Plan  Continue {CHL HP Upstream Pharmacy PTXMIW:[id number]}  Vaccines   Reviewed and discussed patient's vaccination history.    Immunization History  Administered Date(s) Administered  . Fluad Quad(high Dose 65+) 08/15/2019  . Influenza, High Dose Seasonal PF 08/30/2017  . Influenza,inj,Quad PF,6+ Mos 09/01/2015, 09/09/2016, 08/30/2018  . PFIZER SARS-COV-2 Vaccination 01/18/2020, 02/12/2020  . Pneumococcal Conjugate-13 09/01/2015  . Pneumococcal Polysaccharide-23 09/08/2017  . Tdap 08/30/2017  . Zoster Recombinat (Shingrix) 08/15/2019    Plan  Recommended patient receive *** vaccine in *** office.   Medication Management   Pt uses *** pharmacy for all medications Uses pill box? {Yes or If no, why not?:20788} Pt endorses ***% compliance  We discussed: ***  Plan  {US Pharmacy PNOIB:70488}   Follow up: *** month phone visit  ***

## 2020-07-31 NOTE — Progress Notes (Signed)
Date:  07/31/2020   Name:  Judy Bolton   DOB:  1946/04/18   MRN:  [id number]   Chief Complaint: Hypertension and Hyperlipidemia  Hypertension This is a chronic problem. The current episode started more than 1 year ago. The problem has been gradually improving since onset. The problem is controlled. Pertinent negatives include no anxiety, blurred vision, chest pain, headaches, malaise/fatigue, neck pain, orthopnea, palpitations, peripheral edema, PND, shortness of breath or sweats. There are no associated agents to hypertension. There are no known risk factors for coronary artery disease. Past treatments include beta blockers and diuretics. The current treatment provides moderate improvement. There are no compliance problems.  There is no history of angina, kidney disease, CAD/MI, CVA, heart failure, left ventricular hypertrophy, PVD or retinopathy. There is no history of chronic renal disease, a hypertension causing med or renovascular disease.  Hyperlipidemia This is a chronic problem. The current episode started more than 1 year ago. The problem is controlled. Recent lipid tests were reviewed and are normal. She has no history of chronic renal disease, diabetes, hypothyroidism, liver disease, obesity or nephrotic syndrome. Factors aggravating her hyperlipidemia include thiazides. Pertinent negatives include no chest pain, focal sensory loss, focal weakness, leg pain, myalgias or shortness of breath. Current antihyperlipidemic treatment includes statins and fibric acid derivatives. The current treatment provides moderate improvement of lipids. There are no compliance problems.  Risk factors for coronary artery disease include dyslipidemia and hypertension.    Lab Results  Component Value Date   CREATININE 1.22 (H) 10/03/2019   BUN 33 (H) 05/31/2019   NA 135 05/31/2019   K 4.3 05/31/2019   CL 97 (L) 05/31/2019   CO2 22 05/31/2019   Lab Results  Component Value Date   CHOL 185  01/24/2018   HDL 66 01/24/2018   LDLCALC 88 01/24/2018   TRIG 154 (H) 01/24/2018   CHOLHDL 2.8 01/24/2018   No results found for: TSH Lab Results  Component Value Date   HGBA1C 8.9 06/24/2020   Lab Results  Component Value Date   WBC 8.2 05/31/2019   HGB 14.2 05/31/2019   HCT 42.0 05/31/2019   MCV 84.2 05/31/2019   PLT 235 05/31/2019   Lab Results  Component Value Date   ALT 17 05/31/2019   AST 36 05/31/2019   ALKPHOS 80 05/31/2019   BILITOT 0.8 05/31/2019     Review of Systems  Constitutional: Negative.  Negative for chills, fatigue, fever, malaise/fatigue and unexpected weight change.  HENT: Negative for congestion, ear discharge, ear pain, rhinorrhea, sinus pressure, sneezing and sore throat.   Eyes: Negative for blurred vision, photophobia, pain, discharge, redness and itching.  Respiratory: Negative for cough, shortness of breath, wheezing and stridor.   Cardiovascular: Negative for chest pain, palpitations, orthopnea and PND.  Gastrointestinal: Negative for abdominal pain, blood in stool, constipation, diarrhea, nausea and vomiting.  Endocrine: Negative for cold intolerance, heat intolerance, polydipsia, polyphagia and polyuria.  Genitourinary: Negative for dysuria, flank pain, frequency, hematuria, menstrual problem, pelvic pain, urgency, vaginal bleeding and vaginal discharge.  Musculoskeletal: Negative for arthralgias, back pain, myalgias and neck pain.  Skin: Negative for rash.  Allergic/Immunologic: Negative for environmental allergies and food allergies.  Neurological: Negative for dizziness, focal weakness, weakness, light-headedness, numbness and headaches.  Hematological: Negative for adenopathy. Does not bruise/bleed easily.  Psychiatric/Behavioral: Negative for dysphoric mood. The patient is not nervous/anxious.     Patient Active Problem List   Diagnosis Date Noted   Essential hypertension 08/30/2017  Gastroesophageal reflux disease 08/30/2017    Aortic atherosclerosis (HCC) 05/31/2017   PAD (peripheral artery disease) (HCC) 05/31/2017   Encounter for long-term (current) use of high-risk medication 12/28/2016   Bilateral hand pain 11/08/2016   Elevated C-reactive protein 11/08/2016   Swelling of joint of right hand 11/08/2016   Type 2 diabetes mellitus with microalbuminuria, without long-term current use of insulin (HCC) 07/06/2016   Acute anxiety 04/16/2016   Bronchitis 04/16/2016   Centrilobular emphysema (HCC) 04/16/2016   TIA (transient ischemic attack) 02/29/2016   Lumbar stenosis with neurogenic claudication 04/16/2015   Spondylosis of lumbar region without myelopathy or radiculopathy 04/16/2015   Hypertriglyceridemia 10/15/2014    Allergies  Allergen Reactions   Betadine [Povidone Iodine] Swelling   Codeine Itching   Latex Itching and Swelling    (urinary catheter)   Meloxicam Swelling   Penicillins Other (See Comments)    Paralysis  Has patient had a PCN reaction causing immediate rash, facial/tongue/throat swelling, SOB or lightheadedness with hypotension: No Has patient had a PCN reaction causing severe rash involving mucus membranes or skin necrosis: No Has patient had a PCN reaction that required hospitalization Yes Has patient had a PCN reaction occurring within the last 10 years: No If all of the above answers are "NO", then may proceed with Cephalosporin use.    Past Surgical History:  Procedure Laterality Date   ABDOMINAL HYSTERECTOMY     ANTERIOR (CYSTOCELE) AND POSTERIOR REPAIR (RECTOCELE) WITH XENFORM GRAFT AND SACROSPINOUS FIXATION     APPENDECTOMY     BROW LIFT Bilateral 09/05/2018   Procedure: BLEPHAROPLASTY UPPER EYELID W/EXCESS SKIN;  Surgeon: Imagene Riches, MD;  Location[facility];  Service: Ophthalmology;  Laterality: Bilateral;   CTR Bilateral    LUMBAR LAMINECTOMY/DECOMPRESSION MICRODISCECTOMY N/A 07/27/2017   Procedure: LUMBAR LAMINECTOMY/DECOMPRESSION  MICRODISCECTOMY 3 LEVELS-L3-S1;  Surgeon: Venetia Night, MD;  Location: [facility];  Service: Neurosurgery;  Laterality: N/A;   PTOSIS REPAIR Bilateral 09/05/2018   Procedure: PTOSIS REPAIR RESECT EX;  Surgeon: Imagene Riches, MD;  Location: [facility];  Service: Ophthalmology;  Laterality: Bilateral;  Diabetic - oral meds sleep apnea Latexd allergy   ROTATOR CUFF REPAIR Right     Social History   Tobacco Use   Smoking status: Former Smoker    Packs/day: 1.50    Years: 28.00    Pack years: 42.00    Types: Cigarettes    Quit date: 08/14/2000    Years since quitting: 19.9   Smokeless tobacco: Never Used   Tobacco comment: smoking cessatiuon materials not required  Vaping Use   Vaping Use: Never used  Substance Use Topics   Alcohol use: No   Drug use: No     Medication list has been reviewed and updated.  Current Meds  Medication Sig   albuterol (PROVENTIL HFA;VENTOLIN HFA) 108 (90 Base) MCG/ACT inhaler Inhale 2 puffs into the lungs every 6 (six) hours as needed for wheezing or shortness of breath.   albuterol (PROVENTIL) (2.5 MG/3ML) 0.083% nebulizer solution Take 3 mLs (2.5 mg total) by nebulization every 6 (six) hours as needed for wheezing or shortness of breath.   aspirin EC 81 MG tablet Take by mouth.   atorvastatin (LIPITOR) 40 MG tablet Take 1 tablet (40 mg total) by mouth daily.   cyclobenzaprine (FLEXERIL) 10 MG tablet Take 1 tablet (10 mg total) by mouth 3 (three) times daily as needed for muscle spasms.   gabapentin (NEURONTIN) 300 MG capsule Take 1 capsule by mouth at  bedtime. mundy   gemfibrozil (LOPID) 600 MG tablet TAKE 1 TABLET BY MOUTH TWICE A DAY   glipiZIDE (GLUCOTROL) 5 MG tablet Take 5 mg by mouth every morning.   hydrochlorothiazide (HYDRODIURIL) 25 MG tablet Take 1 tablet (25 mg total) by mouth daily.   Investigational - Study Medication Study name: Diabetes medication - oral tablet once daily Additional study details: Dr.  Dareen Piano with [facility]   metFORMIN (GLUCOPHAGE) 500 MG tablet Take 500 mg by mouth 2 (two) times daily.   metoprolol tartrate (LOPRESSOR) 25 MG tablet Take 1 tablet (25 mg total) by mouth 2 (two) times daily.   montelukast (SINGULAIR) 10 MG tablet Take 1 tablet (10 mg total) by mouth daily.   omeprazole (PRILOSEC) 20 MG capsule Take 20 mg by mouth daily. otc   predniSONE (DELTASONE) 10 MG tablet Take 1 tablet (10 mg total) by mouth daily with breakfast. Taper 4,4,4,3,3,3,2,2,2,1,1,1.    PHQ 2/9 Scores 07/31/2020 05/02/2020 04/24/2020 02/06/2020  PHQ - 2 Score 0 0 0 0  PHQ- 9 Score 0 0 0 -    GAD 7 : Generalized Anxiety Score 07/31/2020 05/02/2020 04/24/2020 01/31/2020  Nervous, Anxious, on Edge 0 0 0 0  Control/stop worrying 0 0 0 0  Worry too much - different things 0 0 0 0  Trouble relaxing 0 0 0 0  Restless 0 0 0 0  Easily annoyed or irritable 0 0 0 0  Afraid - awful might happen 0 0 0 0  Total GAD 7 Score 0 0 0 0  Anxiety Difficulty - - - Not difficult at all    BP Readings from Last 3 Encounters:  07/31/20 138/80  05/02/20 120/76  04/24/20 130/80    Physical Exam Vitals and nursing note reviewed.  Constitutional:      Appearance: She is well-developed.  HENT:     Head: Normocephalic.     Right Ear: Tympanic membrane, ear canal and external ear normal.     Left Ear: Tympanic membrane, ear canal and external ear normal.     Nose: Nose normal. No congestion or rhinorrhea.     Mouth/Throat:     Mouth: Mucous membranes are moist.  Eyes:     General: Lids are everted, no foreign bodies appreciated. No scleral icterus.       Left eye: No foreign body or hordeolum.     Extraocular Movements: Extraocular movements intact.     Conjunctiva/sclera: Conjunctivae normal.     Right eye: Right conjunctiva is not injected.     Left eye: Left conjunctiva is not injected.     Pupils: Pupils are equal, round, and reactive to light.  Neck:     Thyroid: No thyromegaly.     Vascular:  No JVD.     Trachea: No tracheal deviation.  Cardiovascular:     Rate and Rhythm: Normal rate and regular rhythm.     Heart sounds: Normal heart sounds. No murmur heard.  No friction rub. No gallop.   Pulmonary:     Effort: Pulmonary effort is normal. No respiratory distress.     Breath sounds: Normal breath sounds. No stridor. No wheezing, rhonchi or rales.  Chest:     Chest wall: No tenderness.  Abdominal:     General: Bowel sounds are normal. There is no distension.     Palpations: Abdomen is soft. There is no mass.     Tenderness: There is no abdominal tenderness. There is no right CVA tenderness, left CVA tenderness,  guarding or rebound.     Hernia: No hernia is present.  Musculoskeletal:        General: No tenderness. Normal range of motion.     Cervical back: Normal range of motion and neck supple.  Lymphadenopathy:     Cervical: No cervical adenopathy.  Skin:    General: Skin is warm.     Findings: No rash.  Neurological:     Mental Status: She is alert and oriented to person, place, and time.     Cranial Nerves: No cranial nerve deficit.     Motor: No weakness.     Deep Tendon Reflexes: Reflexes normal.  Psychiatric:        Mood and Affect: Mood is not anxious or depressed.     Wt Readings from Last 3 Encounters:  07/31/20 149 lb (67.6 kg)  05/02/20 143 lb (64.9 kg)  04/24/20 141 lb (64 kg)    BP 138/80    Pulse 80    Ht 5\' 2"  (1.575 m)    Wt 149 lb (67.6 kg)    BMI 27.25 kg/m   Assessment and Plan:   1. Essential hypertension Chronic.  Controlled.  Stable.  Continue metoprolol 25 mg 1 twice a day as well as hydrochlorothiazide 25 mg once a day.  Reviewed previous renal function panel unremarkable. - metoprolol tartrate (LOPRESSOR) 25 MG tablet; Take 1 tablet (25 mg total) by mouth 2 (two) times daily.  Dispense: 180 tablet; Refill: 1 - hydrochlorothiazide (HYDRODIURIL) 25 MG tablet; Take 1 tablet (25 mg total) by mouth daily.  Dispense: 90 tablet; Refill:  1  2. Hyperlipidemia, unspecified hyperlipidemia type Chronic.  Controlled.  Stable.  Continue gemfibrozil 600 mg twice a day as well as atorvastatin 40 mg once a day. - gemfibrozil (LOPID) 600 MG tablet; Take 1 tablet (600 mg total) by mouth 2 (two) times daily.  Dispense: 180 tablet; Refill: 1 - atorvastatin (LIPITOR) 40 MG tablet; Take 1 tablet (40 mg total) by mouth daily.  Dispense: 90 tablet; Refill: 1  3. Type 2 diabetes mellitus with microalbuminuria, without long-term current use of insulin (HCC) Followed by endocrinology.  4. Aortic atherosclerosis (HCC) Chronic.  Persistent.  Stable.  Well-controlled by maintaining control of blood pressure, lipids, and antiplatelet therapy/low-dose aspirin.

## 2020-08-01 ENCOUNTER — Telehealth: Payer: Self-pay | Admitting: Pharmacist

## 2020-08-01 NOTE — Progress Notes (Signed)
  Chronic Care Management   Outreach Note  08/01/2020 Name: Judy Bolton MRN: [id number] DOB: 05/08/1946  Referred by: Duanne Limerick, MD Reason for referral : No chief complaint on file.   An unsuccessful telephone outreach was attempted today. The patient was referred to the pharmacist for assistance with care management and care coordination.  Left HIPAA compliant message  to return my call at her convenience.  Will  Coordinate with care guides to reschedule for continued medication management concerns.    Mercer Pod. Tiburcio Pea PharmD, BCPS Clinical Pharmacist [facility] [phone]

## 2020-08-15 ENCOUNTER — Other Ambulatory Visit: Payer: Self-pay

## 2020-08-15 ENCOUNTER — Ambulatory Visit (INDEPENDENT_AMBULATORY_CARE_PROVIDER_SITE_OTHER): Payer: Medicare HMO

## 2020-08-15 DIAGNOSIS — Z23 Encounter for immunization: Secondary | ICD-10-CM | POA: Diagnosis not present

## 2020-08-20 ENCOUNTER — Telehealth: Payer: Self-pay

## 2020-08-20 NOTE — Chronic Care Management (AMB) (Signed)
  Care Management   Note  08/20/2020 Name: SCHUYLER BEHAN MRN: [id number] DOB: Oct 23, 1946  HIMANI CORONA is a 74 y.o. year old female who is a primary care patient of Duanne Limerick, MD and is actively engaged with the care management team. I reached out to [name] by phone today to assist with re-scheduling an initial visit with the Pharmacist  Follow up plan: Unsuccessful telephone outreach attempt made. A HIPPA compliant phone message was left for the patient providing contact information and requesting a return call.  The care management team will reach out to the patient again over the next 7 days.  If patient returns call to provider office, please advise to call Embedded Care Management Care Guide Penne Lash  at [phone]  Penne Lash, RMA Care Guide, Embedded Care Coordination [facility]  [place], [place] [address] Direct Dial: [phone] [email or web address]

## 2020-08-25 NOTE — Chronic Care Management (AMB) (Signed)
  Care Management   Note  08/25/2020 Name: [name] MRN: [id number] DOB: 1946/09/02  Judy Bolton is a 74 y.o. year old female who is a primary care patient of Duanne Limerick, MD and is actively engaged with the care management team. I reached out to [name] by phone today to assist with re-scheduling an initial visit with the Pharmacist  Follow up plan: Unsuccessful telephone outreach attempt made. A HIPAA compliant phone message was left for the patient providing contact information and requesting a return call.  The care management team will reach out to the patient again over the next 7 days.  If patient returns call to provider office, please advise to call Embedded Care Management Care Guide Penne Lash  at [phone]  Penne Lash, RMA Care Guide, Embedded Care Coordination [facility]  [place], [place] [address] Direct Dial: [phone] [email or web address]

## 2020-08-28 DIAGNOSIS — E785 Hyperlipidemia, unspecified: Secondary | ICD-10-CM | POA: Diagnosis not present

## 2020-08-28 DIAGNOSIS — E1129 Type 2 diabetes mellitus with other diabetic kidney complication: Secondary | ICD-10-CM | POA: Diagnosis not present

## 2020-08-28 DIAGNOSIS — R809 Proteinuria, unspecified: Secondary | ICD-10-CM | POA: Diagnosis not present

## 2020-08-28 DIAGNOSIS — I10 Essential (primary) hypertension: Secondary | ICD-10-CM | POA: Diagnosis not present

## 2020-08-28 DIAGNOSIS — E538 Deficiency of other specified B group vitamins: Secondary | ICD-10-CM | POA: Diagnosis not present

## 2020-09-01 ENCOUNTER — Telehealth: Payer: Self-pay | Admitting: Family Medicine

## 2020-09-01 NOTE — Telephone Encounter (Signed)
Copied from CRM [id number]. Topic: General - Call Back - No Documentation >> Sep 01, 2020  3:11 PM Baldo Daub L wrote: Reason for CRM:  Pt states she is returning a call to [facility] (was sent to me at [facility]).  States that she is not sure who Amber is or what they needed, but she is calling back.  I do not see any documentation in patient's chart waiting for return call.  Pt states she can be reached at [phone].

## 2020-09-01 NOTE — Chronic Care Management (AMB) (Signed)
  Care Management   Note  09/01/2020 Name: Judy Bolton MRN: [id number] DOB: September 30, 1946  Judy Bolton is a 74 y.o. year old female who is a primary care patient of Duanne Limerick, MD and is actively engaged with the care management team. I reached out to [name] by phone today to assist with re-scheduling an initial visit with the Pharmacist  Follow up plan: Unable to make contact on outreach attempts x 3. PCP Elizabeth Sauer and Pharm D Boykin Nearing  notified via routed documentation in medical record.   Penne Lash, RMA Care Guide, Embedded Care Coordination [facility]  [place], [place] [address] Direct Dial: [phone] [email or web address]

## 2020-09-01 NOTE — Telephone Encounter (Signed)
Spoke to pt

## 2020-09-12 ENCOUNTER — Other Ambulatory Visit: Payer: Self-pay

## 2020-09-12 ENCOUNTER — Encounter: Payer: Self-pay | Admitting: Emergency Medicine

## 2020-09-12 ENCOUNTER — Emergency Department: Payer: Medicare HMO

## 2020-09-12 ENCOUNTER — Ambulatory Visit: Payer: Self-pay | Admitting: *Deleted

## 2020-09-12 DIAGNOSIS — Z87891 Personal history of nicotine dependence: Secondary | ICD-10-CM | POA: Diagnosis not present

## 2020-09-12 DIAGNOSIS — R519 Headache, unspecified: Secondary | ICD-10-CM | POA: Diagnosis not present

## 2020-09-12 DIAGNOSIS — Z79899 Other long term (current) drug therapy: Secondary | ICD-10-CM | POA: Diagnosis not present

## 2020-09-12 DIAGNOSIS — E1121 Type 2 diabetes mellitus with diabetic nephropathy: Secondary | ICD-10-CM | POA: Insufficient documentation

## 2020-09-12 DIAGNOSIS — Z7984 Long term (current) use of oral hypoglycemic drugs: Secondary | ICD-10-CM | POA: Diagnosis not present

## 2020-09-12 DIAGNOSIS — J449 Chronic obstructive pulmonary disease, unspecified: Secondary | ICD-10-CM | POA: Diagnosis not present

## 2020-09-12 DIAGNOSIS — Z8673 Personal history of transient ischemic attack (TIA), and cerebral infarction without residual deficits: Secondary | ICD-10-CM | POA: Insufficient documentation

## 2020-09-12 DIAGNOSIS — Z7982 Long term (current) use of aspirin: Secondary | ICD-10-CM | POA: Insufficient documentation

## 2020-09-12 DIAGNOSIS — I10 Essential (primary) hypertension: Secondary | ICD-10-CM | POA: Diagnosis not present

## 2020-09-12 DIAGNOSIS — H538 Other visual disturbances: Secondary | ICD-10-CM | POA: Diagnosis not present

## 2020-09-12 DIAGNOSIS — R42 Dizziness and giddiness: Secondary | ICD-10-CM | POA: Insufficient documentation

## 2020-09-12 DIAGNOSIS — R4781 Slurred speech: Secondary | ICD-10-CM | POA: Diagnosis not present

## 2020-09-12 DIAGNOSIS — Z91040 Latex allergy status: Secondary | ICD-10-CM | POA: Diagnosis not present

## 2020-09-12 DIAGNOSIS — R030 Elevated blood-pressure reading, without diagnosis of hypertension: Secondary | ICD-10-CM | POA: Diagnosis not present

## 2020-09-12 LAB — COMPREHENSIVE METABOLIC PANEL
ALT: 18 U/L (ref 0–44)
AST: 18 U/L (ref 15–41)
Albumin: 4 g/dL (ref 3.5–5.0)
Alkaline Phosphatase: 61 U/L (ref 38–126)
Anion gap: 10 (ref 5–15)
BUN: 37 mg/dL — ABNORMAL HIGH (ref 8–23)
CO2: 26 mmol/L (ref 22–32)
Calcium: 9.9 mg/dL (ref 8.9–10.3)
Chloride: 99 mmol/L (ref 98–111)
Creatinine, Ser: 1.32 mg/dL — ABNORMAL HIGH (ref 0.44–1.00)
GFR, Estimated: 40 mL/min — ABNORMAL LOW (ref >60)
Glucose, Bld: 204 mg/dL — ABNORMAL HIGH (ref 70–99)
Potassium: 3.9 mmol/L (ref 3.5–5.1)
Sodium: 135 mmol/L (ref 135–145)
Total Bilirubin: 0.6 mg/dL (ref 0.3–1.2)
Total Protein: 7.4 g/dL (ref 6.5–8.1)

## 2020-09-12 LAB — URINALYSIS, COMPLETE (UACMP) WITH MICROSCOPIC
Bilirubin Urine: NEGATIVE
Glucose, UA: NEGATIVE mg/dL
Hgb urine dipstick: NEGATIVE
Ketones, ur: NEGATIVE mg/dL
Leukocytes,Ua: NEGATIVE
Nitrite: NEGATIVE
Protein, ur: 100 mg/dL — AB
Specific Gravity, Urine: 1.006 (ref 1.005–1.030)
pH: 6 (ref 5.0–8.0)

## 2020-09-12 LAB — CBC
HCT: 38.9 % (ref 36.0–46.0)
Hemoglobin: 13.4 g/dL (ref 12.0–15.0)
MCH: 29.5 pg (ref 26.0–34.0)
MCHC: 34.4 g/dL (ref 30.0–36.0)
MCV: 85.7 fL (ref 80.0–100.0)
Platelets: 252 10*3/uL (ref 150–400)
RBC: 4.54 MIL/uL (ref 3.87–5.11)
RDW: 12.7 % (ref 11.5–15.5)
WBC: 11.8 10*3/uL — ABNORMAL HIGH (ref 4.0–10.5)
nRBC: 0 % (ref 0.0–0.2)

## 2020-09-12 LAB — TROPONIN I (HIGH SENSITIVITY): Troponin I (High Sensitivity): 10 ng/L (ref <18)

## 2020-09-12 MED ORDER — METOPROLOL TARTRATE 50 MG PO TABS: 50.0000 mg | ORAL_TABLET | Freq: Two times a day (BID) | ORAL | 1 refills | Status: DC

## 2020-09-12 NOTE — ED Triage Notes (Addendum)
Patient states that her blood pressure has been elevated and feeling dizzy times three days. Patient states that her MD told her to double up on her blood pressure medication today but that her blood pressure has still been high. Patient states that yesterday she was talking on the phone and her speech became garbled. Patient states that she has also had intermittent chest pain that started yesterday.

## 2020-09-12 NOTE — Telephone Encounter (Signed)
Patient states she has dizzy spells yesterday and she has had some today. Patient BP is elevated and is not coming down with time. Advised ED  Reason for Disposition  [1] Systolic BP  >= 160 OR Diastolic >= 100 AND [2] cardiac or neurologic symptoms (e.g., chest pain, difficulty breathing, unsteady gait, blurred vision)  Answer Assessment - Initial Assessment Questions 1. BLOOD PRESSURE: "What is the blood pressure?" "Did you take at least two measurements 5 minutes apart?"     189/91 62, 189/81 63 2. ONSET: "When did you take your blood pressure?"     2:20 3. HOW: "How did you obtain the blood pressure?" (e.g., visiting nurse, automatic home BP monitor)     arm 4. HISTORY: "Do you have a history of high blood pressure?"     yes 5. MEDICATIONS: "Are you taking any medications for blood pressure?" "Have you missed any doses recently?"     Yes- yesterday forgot medication 6. OTHER SYMPTOMS: "Do you have any symptoms?" (e.g., headache, chest pain, blurred vision, difficulty breathing, weakness)     Dizziness- comes and goes- lasting seconds- feels off balance 7. PREGNANCY: "Is there any chance you are pregnant?" "When was your last menstrual period?"     n/a  Protocols used: BLOOD PRESSURE - HIGH-A-AH

## 2020-09-13 ENCOUNTER — Emergency Department: Payer: Medicare HMO

## 2020-09-13 ENCOUNTER — Emergency Department
Admission: EM | Admit: 2020-09-13 | Discharge: 2020-09-13 | Disposition: A | Discharge status: Home / Self Care | Payer: Medicare HMO | Attending: Emergency Medicine | Admitting: Emergency Medicine

## 2020-09-13 DIAGNOSIS — R030 Elevated blood-pressure reading, without diagnosis of hypertension: Secondary | ICD-10-CM | POA: Diagnosis not present

## 2020-09-13 DIAGNOSIS — I10 Essential (primary) hypertension: Secondary | ICD-10-CM

## 2020-09-13 MED ORDER — CLONIDINE HCL 0.1 MG PO TABS
0.1000 mg | ORAL_TABLET | Freq: Once | ORAL | Status: AC
  Administered 2020-09-13: 0.1 mg via ORAL

## 2020-09-13 MED ORDER — CLONIDINE HCL 0.1 MG PO TABS
0.1000 mg | ORAL_TABLET | Freq: Two times a day (BID) | ORAL | 0 refills | Status: DC | PRN
Start: 2020-09-13 — End: 2022-05-04

## 2020-09-13 NOTE — Discharge Instructions (Addendum)
Double your Metoprolol as instructed by your doctor. You may take Clonidine 0.1mg  up to twice daily for: top BP > 180 bottom BP > 100 3. Return to the ER for worsening symptoms, persistent vomiting, difficulty breathing or other concerns.

## 2020-09-13 NOTE — ED Provider Notes (Signed)
[facility] Emergency Department Provider Note   ____________________________________________   First MD Initiated Contact with Patient 09/13/20 [phone]     (approximate)  I have reviewed the triage vital signs and the nursing notes.   HISTORY  Chief Complaint Dizziness and Hypertension    HPI Judy Bolton is a 74 y.o. female who presents to the ED from home with a chief complaint of elevated blood pressure. Patient reports elevated blood pressure and feeling dizzy x3 days. Baseline BP 130s/80s. Missed a dose of her medications. Called her PCP who asked her to increase her Metoprolol 25mg  from qd to bid. Patient also takes Hydrochlorothiazide 25mg  qd. Endorses increased stress at work recently; considering quitting her job. Denies recent illness; denies fever, cough, chest pain, shortness of breath, abdominal pain, nausea or vomiting. Denies slurred speech, confusion, extremity weakness, numbness or tingling. Denies sick contacts.     Past Medical History:  Diagnosis Date  . Acid reflux   . Arthritis   . Asthma   . Back pain   . Back pain   . COPD (chronic obstructive pulmonary disease) (HCC)   . Diabetes mellitus without complication (HCC)   . Hyperlipidemia   . Hypertension   . Seizure (HCC)    x1 - over 30 yrs ago  . Sleep apnea    No CPAP  . Stroke [facility])    March of 2017 vocabulary    Patient Active Problem List   Diagnosis Date Noted  . Essential hypertension 08/30/2017  . Gastroesophageal reflux disease 08/30/2017  . Aortic atherosclerosis (HCC) 05/31/2017  . PAD (peripheral artery disease) (HCC) 05/31/2017  . Encounter for long-term (current) use of high-risk medication 12/28/2016  . Bilateral hand pain 11/08/2016  . Elevated C-reactive protein 11/08/2016  . Swelling of joint of right hand 11/08/2016  . Type 2 diabetes mellitus with microalbuminuria, without long-term current use of insulin (HCC) 07/06/2016  . Acute anxiety  04/16/2016  . Bronchitis 04/16/2016  . Centrilobular emphysema (HCC) 04/16/2016  . TIA (transient ischemic attack) 02/29/2016  . Lumbar stenosis with neurogenic claudication 04/16/2015  . Spondylosis of lumbar region without myelopathy or radiculopathy 04/16/2015  . Hyperlipidemia 10/15/2014    Past Surgical History:  Procedure Laterality Date  . ABDOMINAL HYSTERECTOMY    . ANTERIOR (CYSTOCELE) AND POSTERIOR REPAIR (RECTOCELE) WITH XENFORM GRAFT AND SACROSPINOUS FIXATION    . APPENDECTOMY    . BROW LIFT Bilateral 09/05/2018   Procedure: BLEPHAROPLASTY UPPER EYELID W/EXCESS SKIN;  Surgeon: Imagene Riches, MD;  Location: [facility];  Service: Ophthalmology;  Laterality: Bilateral;  . CTR Bilateral   . LUMBAR LAMINECTOMY/DECOMPRESSION MICRODISCECTOMY N/A 07/27/2017   Procedure: LUMBAR LAMINECTOMY/DECOMPRESSION MICRODISCECTOMY 3 LEVELS-L3-S1;  Surgeon: Venetia Night, MD;  Location: [facility];  Service: Neurosurgery;  Laterality: N/A;  . PTOSIS REPAIR Bilateral 09/05/2018   Procedure: PTOSIS REPAIR RESECT EX;  Surgeon: Imagene Riches, MD;  Location: [facility];  Service: Ophthalmology;  Laterality: Bilateral;  Diabetic - oral meds sleep apnea Latexd allergy  . ROTATOR CUFF REPAIR Right     Prior to Admission medications   Medication Sig Start Date End Date Taking? Authorizing Provider  albuterol (PROVENTIL HFA;VENTOLIN HFA) 108 (90 Base) MCG/ACT inhaler Inhale 2 puffs into the lungs every 6 (six) hours as needed for wheezing or shortness of breath.    [provider]  albuterol (PROVENTIL) (2.5 MG/3ML) 0.083% nebulizer solution Take 3 mLs (2.5 mg total) by nebulization every 6 (six) hours as needed for wheezing  or shortness of breath. 07/10/18   Duanne Limerick, MD  aspirin EC 81 MG tablet Take by mouth.    [provider]  atorvastatin (LIPITOR) 40 MG tablet Take 1 tablet (40 mg total) by mouth daily. 07/31/20   Duanne Limerick, MD  cloNIDine (CATAPRES) 0.1  MG tablet Take 1 tablet (0.1 mg total) by mouth 2 (two) times daily as needed. 09/13/20   Irean Hong, MD  cyclobenzaprine (FLEXERIL) 10 MG tablet Take 1 tablet (10 mg total) by mouth 3 (three) times daily as needed for muscle spasms. 10/10/19   Duanne Limerick, MD  gabapentin (NEURONTIN) 300 MG capsule Take 1 capsule by mouth at bedtime. mundy 12/10/19 07/31/20  [provider]  gemfibrozil (LOPID) 600 MG tablet Take 1 tablet (600 mg total) by mouth 2 (two) times daily. 07/31/20   Duanne Limerick, MD  glipiZIDE (GLUCOTROL) 5 MG tablet Take 5 mg by mouth every morning. 01/16/20   [provider]  hydrochlorothiazide (HYDRODIURIL) 25 MG tablet Take 1 tablet (25 mg total) by mouth daily. 07/31/20   Duanne Limerick, MD  Investigational - Study Medication Study name: Diabetes medication - oral tablet once daily Additional study details: Dr. Dareen Piano with [facility]    [provider]  metFORMIN (GLUCOPHAGE) 500 MG tablet Take 500 mg by mouth 2 (two) times daily. 01/12/20   [provider]  metoprolol tartrate (LOPRESSOR) 50 MG tablet Take 1 tablet (50 mg total) by mouth 2 (two) times daily. 09/12/20   Duanne Limerick, MD  montelukast (SINGULAIR) 10 MG tablet Take 1 tablet (10 mg total) by mouth daily. 01/31/20   Duanne Limerick, MD  omeprazole (PRILOSEC) 20 MG capsule Take 20 mg by mouth daily. otc    [provider]  predniSONE (DELTASONE) 10 MG tablet Take 1 tablet (10 mg total) by mouth daily with breakfast. Taper 4,4,4,3,3,3,2,2,2,1,1,1. 05/02/20   Duanne Limerick, MD    Allergies Betadine [povidone iodine], Codeine, Latex, Meloxicam, and Penicillins  Family History  Problem Relation Age of Onset  . Heart attack Mother   . Prostate cancer Father   . Brain cancer Father   . Heart attack Maternal Grandmother   . Heart attack Maternal Grandfather   . Breast cancer Paternal Grandmother     Social History Social History   Tobacco Use  . Smoking status:  Former Smoker    Packs/day: 1.50    Years: 28.00    Pack years: 42.00    Types: Cigarettes    Quit date: 08/14/2000    Years since quitting: 20.0  . Smokeless tobacco: Never Used  . Tobacco comment: smoking cessatiuon materials not required  Vaping Use  . Vaping Use: Never used  Substance Use Topics  . Alcohol use: No  . Drug use: No    Review of Systems  Constitutional: No fever/chills Eyes: No visual changes. ENT: No sore throat. Cardiovascular: Denies chest pain. Respiratory: Denies shortness of breath. Gastrointestinal: No abdominal pain.  No nausea, no vomiting.  No diarrhea.  No constipation. Genitourinary: Negative for dysuria. Musculoskeletal: Negative for back pain. Skin: Negative for rash. Neurological: Positive for dizziness. Negative for headaches, focal weakness or numbness.   ____________________________________________   PHYSICAL EXAM:  VITAL SIGNS: ED Triage Vitals  Enc Vitals Group     BP 09/12/20 2257 (!) 221/84     Pulse Rate 09/12/20 2257 65     Resp 09/12/20 2257 18     Temp 09/12/20 2257  98.1 F (36.7 C)     Temp Source 09/12/20 2257 Oral     SpO2 09/12/20 2257 98 %     Weight 09/12/20 2258 148 lb (67.1 kg)     Height 09/12/20 2258 5\' 1"  (1.549 m)     Head Circumference --      Peak Flow --      Pain Score 09/12/20 2258 0     Pain Loc --      Pain Edu? --      Excl. in GC? --     Constitutional: Alert and oriented. Well appearing and in no acute distress. Eyes: Conjunctivae are normal. PERRL. EOMI. Head: Atraumatic. Nose: No congestion/rhinnorhea. Mouth/Throat: Mucous membranes are moist.   Neck: No stridor. No carotid bruits. Cardiovascular: Normal rate, regular rhythm. Grossly normal heart sounds.  Good peripheral circulation. Respiratory: Normal respiratory effort.  No retractions. Lungs CTAB. Gastrointestinal: Soft and nontender. No distention. No abdominal bruits. No CVA tenderness. Musculoskeletal: No lower extremity tenderness  nor edema.  No joint effusions. Neurologic: Alert and oriented x3. CN II to XII grossly intact. Normal speech and language. No gross focal neurologic deficits are appreciated. No gait instability. Skin:  Skin is warm, dry and intact. No rash noted. Psychiatric: Mood and affect are normal. Speech and behavior are normal.  ____________________________________________   LABS (all labs ordered are listed, but only abnormal results are displayed)  Labs Reviewed  CBC - Abnormal; Notable for the following components:      Result Value   WBC 11.8 (*)    All other components within normal limits  URINALYSIS, COMPLETE (UACMP) WITH MICROSCOPIC - Abnormal; Notable for the following components:   Color, Urine STRAW (*)    APPearance CLEAR (*)    Protein, ur 100 (*)    Bacteria, UA RARE (*)    All other components within normal limits  COMPREHENSIVE METABOLIC PANEL - Abnormal; Notable for the following components:   Glucose, Bld 204 (*)    BUN 37 (*)    Creatinine, Ser 1.32 (*)    GFR, Estimated 40 (*)    All other components within normal limits  TROPONIN I (HIGH SENSITIVITY)   ____________________________________________  EKG  ED ECG REPORT I, Emanuelle Hammerstrom J, the attending physician, personally viewed and interpreted this ECG.   Date: 09/13/2020  EKG Time: 2256  Rate: 65  Rhythm: normal EKG, normal sinus rhythm  Axis: Normal  Intervals:none  ST&T Change: Nonspecific  ____________________________________________  RADIOLOGY I, Osie Merkin J, personally viewed and evaluated these images (plain radiographs) as part of my medical decision making, as well as reviewing the written report by the radiologist.  ED MD interpretation: No ICH; no acute cardiopulmonary process  Official radiology report(s): DG Chest 2 View  Result Date: 09/13/2020 CLINICAL DATA:  Elevated blood pressure. EXAM: CHEST - 2 VIEW COMPARISON:  May 02, 2020 FINDINGS: The heart size and mediastinal contours are within  normal limits. Both lungs are clear. The visualized skeletal structures are unremarkable. IMPRESSION: No active cardiopulmonary disease. Electronically Signed   By: May 04, 2020 M.D.   On: 09/13/2020 00:50   CT Head Wo Contrast  Result Date: 09/12/2020 CLINICAL DATA:  Hypertension, headache, blurred vision, slurred speech EXAM: CT HEAD WITHOUT CONTRAST TECHNIQUE: Contiguous axial images were obtained from the base of the skull through the vertex without intravenous contrast. COMPARISON:  None. FINDINGS: Brain: Normal anatomic configuration. No abnormal intra or extra-axial mass lesion or fluid collection. No abnormal mass effect or midline shift. No  evidence of acute intracranial hemorrhage or infarct. Ventricular size is normal. Cerebellum unremarkable. Vascular: Unremarkable Skull: Intact Sinuses/Orbits: Paranasal sinuses are clear. Orbits are unremarkable. Other: Mastoid air cells and middle ear cavities are clear. IMPRESSION: No acute intracranial abnormality. Electronically Signed   By: Helyn Numbers MD   On: 09/12/2020 23:25    ____________________________________________   PROCEDURES  Procedure(s) performed (including Critical Care):  Procedures   ____________________________________________   INITIAL IMPRESSION / ASSESSMENT AND PLAN / ED COURSE  As part of my medical decision making, I reviewed the following data within the electronic MEDICAL RECORD NUMBER Nursing notes reviewed and incorporated, Labs reviewed, EKG interpreted, Old chart reviewed, Radiograph reviewed and Notes from prior ED visits     74 year old female presenting with dizziness and elevated blood pressure. Differential diagnosis includes but is not limited to ACS, TIA/CVA, infectious, metabolic etiologies, etc.  Laboratory and imaging results unremarkable. Patient currently asymptomatic and eager for discharge home due to the long wait. Blood pressure improved, 182/77. She will continue to double her Metoprolol.  Will prescribe Clonidine for rescue use at home as needed. Patient will follow up closely with her PCP. Strict return precautions given. Patient verbalizes understanding agrees with plan of care.      ____________________________________________   FINAL CLINICAL IMPRESSION(S) / ED DIAGNOSES  Final diagnoses:  Hypertension, unspecified type     ED Discharge Orders         Ordered    cloNIDine (CATAPRES) 0.1 MG tablet  2 times daily PRN        09/13/20 0247          *Please note:  HAYDIN HARSTON was evaluated in Emergency Department on 09/13/2020 for the symptoms described in the history of present illness. She was evaluated in the context of the global COVID-19 pandemic, which necessitated consideration that the patient might be at risk for infection with the SARS-CoV-2 virus that causes COVID-19. Institutional protocols and algorithms that pertain to the evaluation of patients at risk for COVID-19 are in a state of rapid change based on information released by regulatory bodies including the CDC and federal and state organizations. These policies and algorithms were followed during the patient's care in the ED.  Some ED evaluations and interventions may be delayed as a result of limited staffing during and the pandemic.*   Note:  This document was prepared using Dragon voice recognition software and may include unintentional dictation errors.   Irean Hong, MD 09/13/20 [phone]

## 2020-09-13 NOTE — ED Notes (Signed)
No peripheral IV placed this visit.   Discharge instructions reviewed with patient. Questions fielded by this RN. Patient verbalizes understanding of instructions. Patient discharged home in stable condition per Dr Dolores Frame . No acute distress noted at time of discharge.   Pt ambu to DC

## 2020-09-13 NOTE — ED Notes (Signed)
Pt dressed and toileted DC'd att d/t registration being finished

## 2020-09-15 ENCOUNTER — Ambulatory Visit (INDEPENDENT_AMBULATORY_CARE_PROVIDER_SITE_OTHER): Payer: Medicare HMO | Admitting: Family Medicine

## 2020-09-15 ENCOUNTER — Other Ambulatory Visit: Payer: Self-pay

## 2020-09-15 ENCOUNTER — Encounter: Payer: Self-pay | Admitting: Family Medicine

## 2020-09-15 DIAGNOSIS — I10 Essential (primary) hypertension: Secondary | ICD-10-CM | POA: Diagnosis not present

## 2020-09-15 MED ORDER — METOPROLOL TARTRATE 50 MG PO TABS: 50.0000 mg | ORAL_TABLET | Freq: Two times a day (BID) | ORAL | 1 refills | Status: DC

## 2020-09-15 NOTE — Patient Instructions (Signed)
How to Take Your Blood Pressure You can take your blood pressure at home with a machine. You may need to check your blood pressure at home:  To check if you have high blood pressure (hypertension).  To check your blood pressure over time.  To make sure your blood pressure medicine is working. Supplies needed: You will need a blood pressure machine, or monitor. You can buy one at a drugstore or online. When choosing one:  Choose one with an arm cuff.  Choose one that wraps around your upper arm. Only one finger should fit between your arm and the cuff.  Do not choose one that measures your blood pressure from your wrist or finger. Your doctor can suggest a monitor. How to prepare Avoid these things for 30 minutes before checking your blood pressure:  Drinking caffeine.  Drinking alcohol.  Eating.  Smoking.  Exercising. Five minutes before checking your blood pressure:  Pee.  Sit in a dining chair. Avoid sitting in a soft couch or armchair.  Be quiet. Do not talk. How to take your blood pressure Follow the instructions that came with your machine. If you have a digital blood pressure monitor, these may be the instructions: 1. Sit up straight. 2. Place your feet on the floor. Do not cross your ankles or legs. 3. Rest your left arm at the level of your heart. You may rest it on a table, desk, or chair. 4. Pull up your shirt sleeve. 5. Wrap the blood pressure cuff around the upper part of your left arm. The cuff should be 1 inch (2.5 cm) above your elbow. It is best to wrap the cuff around bare skin. 6. Fit the cuff snugly around your arm. You should be able to place only one finger between the cuff and your arm. 7. Put the cord inside the groove of your elbow. 8. Press the power button. 9. Sit quietly while the cuff fills with air and loses air. 10. Write down the numbers on the screen. 11. Wait 2-3 minutes and then repeat steps 1-10. What do the numbers mean? Two  numbers make up your blood pressure. The first number is called systolic pressure. The second is called diastolic pressure. An example of a blood pressure reading is "120 over 80" (or 120/80). If you are an adult and do not have a medical condition, use this guide to find out if your blood pressure is normal: Normal  First number: below 120.  Second number: below 80. Elevated  First number: 120-129.  Second number: below 80. Hypertension stage 1  First number: 130-139.  Second number: 80-89. Hypertension stage 2  First number: 140 or above.  Second number: 90 or above. Your blood pressure is above normal even if only the top or bottom number is above normal. Follow these instructions at home:  Check your blood pressure as often as your doctor tells you to.  Take your monitor to your next doctor's appointment. Your doctor will: ? Make sure you are using it correctly. ? Make sure it is working right.  Make sure you understand what your blood pressure numbers should be.  Tell your doctor if your medicines are causing side effects. Contact a doctor if:  Your blood pressure keeps being high. Get help right away if:  Your first blood pressure number is higher than 180.  Your second blood pressure number is higher than 120. This information is not intended to replace advice given to you by your health   care provider. Make sure you discuss any questions you have with your health care provider. Document Revised: 11/04/2017 Document Reviewed: 04/30/2016 Elsevier Patient Education  2020 [employer]  

## 2020-09-15 NOTE — Progress Notes (Signed)
Date:  09/15/2020   Name:  Judy Bolton   DOB:  1946/10/18   MRN:  [id number]   Chief Complaint: Hypertension (follow up from doubling up on metoprolol and ER added Clonidine as needed)  Hypertension This is a chronic problem. The current episode started more than 1 year ago. The problem has been waxing and waning since onset. The problem is controlled. Pertinent negatives include no anxiety, blurred vision, chest pain, headaches, malaise/fatigue, neck pain, orthopnea, palpitations, peripheral edema, PND, shortness of breath or sweats. There are no associated agents to hypertension. Past treatments include beta blockers and diuretics. The current treatment provides moderate improvement. There are no compliance problems.  Hypertensive end-organ damage includes CVA. There is no history of angina, kidney disease, CAD/MI, heart failure, left ventricular hypertrophy, PVD or retinopathy. There is no history of chronic renal disease, a hypertension causing med or renovascular disease.    Lab Results  Component Value Date   CREATININE 1.32 (H) 09/12/2020   BUN 37 (H) 09/12/2020   NA 135 09/12/2020   K 3.9 09/12/2020   CL 99 09/12/2020   CO2 26 09/12/2020   Lab Results  Component Value Date   CHOL 185 01/24/2018   HDL 66 01/24/2018   LDLCALC 88 01/24/2018   TRIG 154 (H) 01/24/2018   CHOLHDL 2.8 01/24/2018   No results found for: TSH Lab Results  Component Value Date   HGBA1C 8.9 06/24/2020   Lab Results  Component Value Date   WBC 11.8 (H) 09/12/2020   HGB 13.4 09/12/2020   HCT 38.9 09/12/2020   MCV 85.7 09/12/2020   PLT 252 09/12/2020   Lab Results  Component Value Date   ALT 18 09/12/2020   AST 18 09/12/2020   ALKPHOS 61 09/12/2020   BILITOT 0.6 09/12/2020     Review of Systems  Constitutional: Negative.  Negative for chills, fatigue, fever, malaise/fatigue and unexpected weight change.  HENT: Negative for congestion, ear discharge, ear pain, rhinorrhea, sinus  pressure, sneezing and sore throat.   Eyes: Negative for blurred vision, photophobia, pain, discharge, redness and itching.  Respiratory: Negative for cough, shortness of breath, wheezing and stridor.   Cardiovascular: Negative for chest pain, palpitations, orthopnea and PND.  Gastrointestinal: Negative for abdominal pain, blood in stool, constipation, diarrhea, nausea and vomiting.  Endocrine: Negative for cold intolerance, heat intolerance, polydipsia, polyphagia and polyuria.  Genitourinary: Negative for dysuria, flank pain, frequency, hematuria, menstrual problem, pelvic pain, urgency, vaginal bleeding and vaginal discharge.  Musculoskeletal: Negative for arthralgias, back pain, myalgias and neck pain.  Skin: Negative for rash.  Allergic/Immunologic: Negative for environmental allergies and food allergies.  Neurological: Negative for dizziness, weakness, light-headedness, numbness and headaches.  Hematological: Negative for adenopathy. Does not bruise/bleed easily.  Psychiatric/Behavioral: Negative for dysphoric mood. The patient is not nervous/anxious.     Patient Active Problem List   Diagnosis Date Noted  . Essential hypertension 08/30/2017  . Gastroesophageal reflux disease 08/30/2017  . Aortic atherosclerosis (HCC) 05/31/2017  . PAD (peripheral artery disease) (HCC) 05/31/2017  . Encounter for long-term (current) use of high-risk medication 12/28/2016  . Bilateral hand pain 11/08/2016  . Elevated C-reactive protein 11/08/2016  . Swelling of joint of right hand 11/08/2016  . Type 2 diabetes mellitus with microalbuminuria, without long-term current use of insulin (HCC) 07/06/2016  . Acute anxiety 04/16/2016  . Bronchitis 04/16/2016  . Centrilobular emphysema (HCC) 04/16/2016  . TIA (transient ischemic attack) 02/29/2016  . Lumbar stenosis with neurogenic claudication 04/16/2015  .  Spondylosis of lumbar region without myelopathy or radiculopathy 04/16/2015  . Hyperlipidemia  10/15/2014    Allergies  Allergen Reactions  . Betadine [Povidone Iodine] Swelling  . Codeine Itching  . Latex Itching and Swelling    (urinary catheter)  . Meloxicam Swelling  . Penicillins Other (See Comments)    Paralysis  Has patient had a PCN reaction causing immediate rash, facial/tongue/throat swelling, SOB or lightheadedness with hypotension: No Has patient had a PCN reaction causing severe rash involving mucus membranes or skin necrosis: No Has patient had a PCN reaction that required hospitalization Yes Has patient had a PCN reaction occurring within the last 10 years: No If all of the above answers are "NO", then may proceed with Cephalosporin use.    Past Surgical History:  Procedure Laterality Date  . ABDOMINAL HYSTERECTOMY    . ANTERIOR (CYSTOCELE) AND POSTERIOR REPAIR (RECTOCELE) WITH XENFORM GRAFT AND SACROSPINOUS FIXATION    . APPENDECTOMY    . BROW LIFT Bilateral 09/05/2018   Procedure: BLEPHAROPLASTY UPPER EYELID W/EXCESS SKIN;  Surgeon: Imagene Riches, MD;  Location: [facility];  Service: Ophthalmology;  Laterality: Bilateral;  . CTR Bilateral   . LUMBAR LAMINECTOMY/DECOMPRESSION MICRODISCECTOMY N/A 07/27/2017   Procedure: LUMBAR LAMINECTOMY/DECOMPRESSION MICRODISCECTOMY 3 LEVELS-L3-S1;  Surgeon: Venetia Night, MD;  Location: [facility];  Service: Neurosurgery;  Laterality: N/A;  . PTOSIS REPAIR Bilateral 09/05/2018   Procedure: PTOSIS REPAIR RESECT EX;  Surgeon: Imagene Riches, MD;  Location: [facility];  Service: Ophthalmology;  Laterality: Bilateral;  Diabetic - oral meds sleep apnea Latexd allergy  . ROTATOR CUFF REPAIR Right     Social History   Tobacco Use  . Smoking status: Former Smoker    Packs/day: 1.50    Years: 28.00    Pack years: 42.00    Types: Cigarettes    Quit date: 08/14/2000    Years since quitting: 20.1  . Smokeless tobacco: Never Used  . Tobacco comment: smoking cessatiuon materials not required  Vaping Use  .  Vaping Use: Never used  Substance Use Topics  . Alcohol use: No  . Drug use: No     Medication list has been reviewed and updated.  Current Meds  Medication Sig  . albuterol (PROVENTIL HFA;VENTOLIN HFA) 108 (90 Base) MCG/ACT inhaler Inhale 2 puffs into the lungs every 6 (six) hours as needed for wheezing or shortness of breath.  Marland Kitchen albuterol (PROVENTIL) (2.5 MG/3ML) 0.083% nebulizer solution Take 3 mLs (2.5 mg total) by nebulization every 6 (six) hours as needed for wheezing or shortness of breath.  Marland Kitchen aspirin EC 81 MG tablet Take by mouth.  Marland Kitchen atorvastatin (LIPITOR) 40 MG tablet Take 1 tablet (40 mg total) by mouth daily.  . cloNIDine (CATAPRES) 0.1 MG tablet Take 1 tablet (0.1 mg total) by mouth 2 (two) times daily as needed.  . cyclobenzaprine (FLEXERIL) 10 MG tablet Take 1 tablet (10 mg total) by mouth 3 (three) times daily as needed for muscle spasms.  Marland Kitchen gabapentin (NEURONTIN) 100 MG capsule Take 2 capsules by mouth at bedtime. mundy  . gemfibrozil (LOPID) 600 MG tablet Take 1 tablet (600 mg total) by mouth 2 (two) times daily.  Marland Kitchen glipiZIDE (GLUCOTROL) 5 MG tablet Take 5 mg by mouth every morning.  . hydrochlorothiazide (HYDRODIURIL) 25 MG tablet Take 1 tablet (25 mg total) by mouth daily.  . Investigational - Study Medication Study name: Diabetes medication - oral tablet once daily Additional study details: Dr. Dareen Piano with [facility]  . metFORMIN (  GLUCOPHAGE) 500 MG tablet Take 500 mg by mouth 2 (two) times daily.  . metoprolol tartrate (LOPRESSOR) 50 MG tablet Take 1 tablet (50 mg total) by mouth 2 (two) times daily.  . montelukast (SINGULAIR) 10 MG tablet Take 1 tablet (10 mg total) by mouth daily.  Marland Kitchen omeprazole (PRILOSEC) 20 MG capsule Take 20 mg by mouth daily. otc  . predniSONE (DELTASONE) 10 MG tablet Take 1 tablet by mouth daily. [place]    [facility] 2/9 Scores 07/31/2020 05/02/2020 04/24/2020 02/06/2020  PHQ - 2 Score 0 0 0 0  PHQ- 9 Score 0 0 0 -    GAD 7 : Generalized Anxiety  Score 07/31/2020 05/02/2020 04/24/2020 01/31/2020  Nervous, Anxious, on Edge 0 0 0 0  Control/stop worrying 0 0 0 0  Worry too much - different things 0 0 0 0  Trouble relaxing 0 0 0 0  Restless 0 0 0 0  Easily annoyed or irritable 0 0 0 0  Afraid - awful might happen 0 0 0 0  Total GAD 7 Score 0 0 0 0  Anxiety Difficulty - - - Not difficult at all    BP Readings from Last 3 Encounters:  09/15/20 124/88  09/13/20 (!) 192/113  07/31/20 138/80    Physical Exam Vitals and nursing note reviewed.  Constitutional:      Appearance: She is well-developed.  HENT:     Head: Normocephalic.     Right Ear: Tympanic membrane, ear canal and external ear normal.     Left Ear: Tympanic membrane, ear canal and external ear normal.     Nose: Nose normal.     Mouth/Throat:     Mouth: Mucous membranes are moist.  Eyes:     General: Lids are everted, no foreign bodies appreciated. No scleral icterus.       Left eye: No foreign body or hordeolum.     Conjunctiva/sclera: Conjunctivae normal.     Right eye: Right conjunctiva is not injected.     Left eye: Left conjunctiva is not injected.     Pupils: Pupils are equal, round, and reactive to light.  Neck:     Thyroid: No thyromegaly.     Vascular: No JVD.     Trachea: No tracheal deviation.  Cardiovascular:     Rate and Rhythm: Normal rate and regular rhythm.     Heart sounds: Normal heart sounds. No murmur heard.  No friction rub. No gallop.   Pulmonary:     Effort: Pulmonary effort is normal. No respiratory distress.     Breath sounds: Normal breath sounds. No wheezing, rhonchi or rales.  Abdominal:     General: Bowel sounds are normal.     Palpations: Abdomen is soft. There is no mass.     Tenderness: There is no abdominal tenderness. There is no guarding or rebound.  Musculoskeletal:        General: No tenderness. Normal range of motion.     Cervical back: Normal range of motion and neck supple.  Lymphadenopathy:     Cervical: No  cervical adenopathy.  Skin:    General: Skin is warm.     Findings: No rash.  Neurological:     Mental Status: She is alert and oriented to person, place, and time.     Cranial Nerves: No cranial nerve deficit.     Deep Tendon Reflexes: Reflexes normal.  Psychiatric:        Mood and Affect: Mood is not anxious or depressed.  Wt Readings from Last 3 Encounters:  09/15/20 146 lb (66.2 kg)  09/12/20 148 lb (67.1 kg)  07/31/20 149 lb (67.6 kg)    BP 124/88   Pulse 60   Ht 5\' 1"  (1.549 m)   Wt 146 lb (66.2 kg)   BMI 27.59 kg/m   Assessment and Plan:  1. Essential hypertension Chronic.  Episodic elevations.  Stable.  On Friday patient had readings that were elevated in the 1 70-200 range systolically and this occurred after we had recently decreased her metoprolol to 25 mg twice a day due to some bradycardia.  Patient went to the ER and after somewhat of away she was eventually seen and it was determined to increase metoprolol back to the original 50 mg twice a day and patient was given some clonidine to take on an as-needed basis.  Today patient has an excellent reading in the 124/88 range.  Patient's been instructed to maybe not take her blood pressure but 2-3 times a day and we reiterated where to take it and under what circumstances information was given.  We will recheck patient in 4 to 6 months or on an as-needed basis. - metoprolol tartrate (LOPRESSOR) 50 MG tablet; Take 1 tablet (50 mg total) by mouth 2 (two) times daily.  Dispense: 90 tablet; Refill: 1

## 2020-11-19 ENCOUNTER — Other Ambulatory Visit: Payer: Self-pay | Admitting: Family Medicine

## 2020-11-20 ENCOUNTER — Other Ambulatory Visit: Payer: Self-pay | Admitting: Family Medicine

## 2020-11-20 DIAGNOSIS — E785 Hyperlipidemia, unspecified: Secondary | ICD-10-CM

## 2020-11-20 MED ORDER — MONTELUKAST SODIUM 10 MG PO TABS: 10.0000 mg | ORAL_TABLET | Freq: Every day | ORAL | 1 refills | Status: DC

## 2020-11-20 MED ORDER — GEMFIBROZIL 600 MG PO TABS: 600.0000 mg | ORAL_TABLET | Freq: Two times a day (BID) | ORAL | 1 refills | Status: DC

## 2020-11-20 MED ORDER — ATORVASTATIN CALCIUM 40 MG PO TABS: 40.0000 mg | ORAL_TABLET | Freq: Every day | ORAL | 1 refills | Status: DC

## 2020-11-20 NOTE — Telephone Encounter (Signed)
Atorvastatin 40 mg  Singulair 10 mg  Lopid 600mg 

## 2020-12-01 ENCOUNTER — Other Ambulatory Visit: Payer: Self-pay | Admitting: Family Medicine

## 2020-12-01 DIAGNOSIS — I10 Essential (primary) hypertension: Secondary | ICD-10-CM

## 2020-12-13 LAB — HEMOGLOBIN A1C: Hemoglobin A1C: 7.2

## 2020-12-15 LAB — HM DIABETES EYE EXAM

## 2020-12-17 ENCOUNTER — Other Ambulatory Visit: Payer: Self-pay | Admitting: Family Medicine

## 2020-12-17 DIAGNOSIS — I10 Essential (primary) hypertension: Secondary | ICD-10-CM

## 2020-12-17 NOTE — Progress Notes (Unsigned)
Entered diab eye exam 

## 2021-01-07 DIAGNOSIS — J310 Chronic rhinitis: Secondary | ICD-10-CM | POA: Diagnosis not present

## 2021-01-07 DIAGNOSIS — J9801 Acute bronchospasm: Secondary | ICD-10-CM | POA: Diagnosis not present

## 2021-01-07 DIAGNOSIS — J449 Chronic obstructive pulmonary disease, unspecified: Secondary | ICD-10-CM | POA: Diagnosis not present

## 2021-01-29 ENCOUNTER — Other Ambulatory Visit: Payer: Self-pay

## 2021-01-29 ENCOUNTER — Ambulatory Visit: Payer: Self-pay | Admitting: Family Medicine

## 2021-01-29 ENCOUNTER — Encounter: Payer: Self-pay | Admitting: Family Medicine

## 2021-01-29 ENCOUNTER — Ambulatory Visit (INDEPENDENT_AMBULATORY_CARE_PROVIDER_SITE_OTHER): Payer: Medicare Other | Admitting: Family Medicine

## 2021-01-29 VITALS — BP 150/84 | HR 82 | Ht 61.0 in | Wt 153.0 lb

## 2021-01-29 DIAGNOSIS — E785 Hyperlipidemia, unspecified: Secondary | ICD-10-CM | POA: Diagnosis not present

## 2021-01-29 DIAGNOSIS — J301 Allergic rhinitis due to pollen: Secondary | ICD-10-CM

## 2021-01-29 DIAGNOSIS — I10 Essential (primary) hypertension: Secondary | ICD-10-CM

## 2021-01-29 MED ORDER — ATORVASTATIN CALCIUM 40 MG PO TABS: 40.0000 mg | ORAL_TABLET | Freq: Every day | ORAL | 1 refills | Status: DC

## 2021-01-29 MED ORDER — GEMFIBROZIL 600 MG PO TABS: 600.0000 mg | ORAL_TABLET | Freq: Two times a day (BID) | ORAL | 1 refills | Status: DC

## 2021-01-29 MED ORDER — HYDROCHLOROTHIAZIDE 25 MG PO TABS: 25.0000 mg | ORAL_TABLET | Freq: Every day | ORAL | 1 refills | Status: DC

## 2021-01-29 MED ORDER — MONTELUKAST SODIUM 10 MG PO TABS: 10.0000 mg | ORAL_TABLET | Freq: Every day | ORAL | 1 refills | Status: DC

## 2021-01-29 MED ORDER — METOPROLOL TARTRATE 50 MG PO TABS: ORAL_TABLET | ORAL | 1 refills | Status: DC

## 2021-01-29 MED ORDER — VALSARTAN 40 MG PO TABS: 40.0000 mg | ORAL_TABLET | Freq: Every day | ORAL | 0 refills | Status: DC

## 2021-01-29 NOTE — Progress Notes (Signed)
Date:  01/29/2021   Name:  Judy Bolton   DOB:  31-Dec-1945   MRN:  [id number]   Chief Complaint: Hypertension, Hyperlipidemia, and Allergic Rhinitis   Hypertension This is a chronic problem. The current episode started more than 1 year ago. The problem is uncontrolled. Pertinent negatives include no anxiety, blurred vision, chest pain, headaches, malaise/fatigue, neck pain, orthopnea, palpitations, peripheral edema, PND, shortness of breath or sweats. There are no associated agents to hypertension. Risk factors for coronary artery disease include dyslipidemia. Past treatments include beta blockers and diuretics. The current treatment provides mild improvement. There are no compliance problems.  There is no history of angina, kidney disease, CVA, heart failure, PVD or retinopathy. There is no history of chronic renal disease.  Hyperlipidemia This is a chronic problem. The current episode started more than 1 year ago. The problem is controlled. Recent lipid tests were reviewed and are normal. She has no history of chronic renal disease, diabetes, hypothyroidism, liver disease, obesity or nephrotic syndrome. Pertinent negatives include no chest pain, focal sensory loss, focal weakness, leg pain, myalgias or shortness of breath. Current antihyperlipidemic treatment includes statins. The current treatment provides moderate improvement of lipids. There are no compliance problems.  Risk factors for coronary artery disease include hypertension and dyslipidemia.  URI  This is a chronic problem. The current episode started more than 1 year ago. The problem has been gradually improving. There has been no fever. Pertinent negatives include no abdominal pain, chest pain, congestion, coughing, diarrhea, dysuria, ear pain, headaches, nausea, neck pain, rash, rhinorrhea, sneezing, sore throat, vomiting or wheezing. The treatment provided mild relief.    Lab Results  Component Value Date   CREATININE 1.32 (H)  09/12/2020   BUN 37 (H) 09/12/2020   NA 135 09/12/2020   K 3.9 09/12/2020   CL 99 09/12/2020   CO2 26 09/12/2020   Lab Results  Component Value Date   CHOL 185 01/24/2018   HDL 66 01/24/2018   LDLCALC 88 01/24/2018   TRIG 154 (H) 01/24/2018   CHOLHDL 2.8 01/24/2018   No results found for: TSH Lab Results  Component Value Date   HGBA1C 7.2 12/13/2020   Lab Results  Component Value Date   WBC 11.8 (H) 09/12/2020   HGB 13.4 09/12/2020   HCT 38.9 09/12/2020   MCV 85.7 09/12/2020   PLT 252 09/12/2020   Lab Results  Component Value Date   ALT 18 09/12/2020   AST 18 09/12/2020   ALKPHOS 61 09/12/2020   BILITOT 0.6 09/12/2020     Review of Systems  Constitutional: Negative.  Negative for chills, fatigue, fever, malaise/fatigue and unexpected weight change.  HENT: Negative for congestion, ear discharge, ear pain, rhinorrhea, sinus pressure, sneezing and sore throat.   Eyes: Negative for blurred vision, double vision, photophobia, pain, discharge, redness and itching.  Respiratory: Negative for cough, shortness of breath, wheezing and stridor.   Cardiovascular: Negative for chest pain, palpitations, orthopnea and PND.  Gastrointestinal: Negative for abdominal pain, blood in stool, constipation, diarrhea, nausea and vomiting.  Endocrine: Negative for cold intolerance, heat intolerance, polydipsia, polyphagia and polyuria.  Genitourinary: Negative for dysuria, flank pain, frequency, hematuria, menstrual problem, pelvic pain, urgency, vaginal bleeding and vaginal discharge.  Musculoskeletal: Negative for arthralgias, back pain, myalgias and neck pain.  Skin: Negative for rash.  Allergic/Immunologic: Negative for environmental allergies and food allergies.  Neurological: Negative for dizziness, focal weakness, weakness, light-headedness, numbness and headaches.  Hematological: Negative for adenopathy. Does not  bruise/bleed easily.  Psychiatric/Behavioral: Negative for dysphoric  mood. The patient is not nervous/anxious.     Patient Active Problem List   Diagnosis Date Noted  . Essential hypertension 08/30/2017  . Gastroesophageal reflux disease 08/30/2017  . Aortic atherosclerosis (HCC) 05/31/2017  . PAD (peripheral artery disease) (HCC) 05/31/2017  . Encounter for long-term (current) use of high-risk medication 12/28/2016  . Bilateral hand pain 11/08/2016  . Elevated C-reactive protein 11/08/2016  . Swelling of joint of right hand 11/08/2016  . Type 2 diabetes mellitus with microalbuminuria, without long-term current use of insulin (HCC) 07/06/2016  . Acute anxiety 04/16/2016  . Bronchitis 04/16/2016  . Centrilobular emphysema (HCC) 04/16/2016  . TIA (transient ischemic attack) 02/29/2016  . Lumbar stenosis with neurogenic claudication 04/16/2015  . Spondylosis of lumbar region without myelopathy or radiculopathy 04/16/2015  . Hyperlipidemia 10/15/2014    Allergies  Allergen Reactions  . Betadine [Povidone Iodine] Swelling  . Codeine Itching  . Latex Itching and Swelling    (urinary catheter)  . Meloxicam Swelling  . Penicillins Other (See Comments)    Paralysis  Has patient had a PCN reaction causing immediate rash, facial/tongue/throat swelling, SOB or lightheadedness with hypotension: No Has patient had a PCN reaction causing severe rash involving mucus membranes or skin necrosis: No Has patient had a PCN reaction that required hospitalization Yes Has patient had a PCN reaction occurring within the last 10 years: No If all of the above answers are "NO", then may proceed with Cephalosporin use.    Past Surgical History:  Procedure Laterality Date  . ABDOMINAL HYSTERECTOMY    . ANTERIOR (CYSTOCELE) AND POSTERIOR REPAIR (RECTOCELE) WITH XENFORM GRAFT AND SACROSPINOUS FIXATION    . APPENDECTOMY    . BROW LIFT Bilateral 09/05/2018   Procedure: BLEPHAROPLASTY UPPER EYELID W/EXCESS SKIN;  Surgeon: Imagene Riches, MD;  Location: [facility];   Service: Ophthalmology;  Laterality: Bilateral;  . CTR Bilateral   . LUMBAR LAMINECTOMY/DECOMPRESSION MICRODISCECTOMY N/A 07/27/2017   Procedure: LUMBAR LAMINECTOMY/DECOMPRESSION MICRODISCECTOMY 3 LEVELS-L3-S1;  Surgeon: Venetia Night, MD;  Location: [facility];  Service: Neurosurgery;  Laterality: N/A;  . PTOSIS REPAIR Bilateral 09/05/2018   Procedure: PTOSIS REPAIR RESECT EX;  Surgeon: Imagene Riches, MD;  Location: [facility];  Service: Ophthalmology;  Laterality: Bilateral;  Diabetic - oral meds sleep apnea Latexd allergy  . ROTATOR CUFF REPAIR Right     Social History   Tobacco Use  . Smoking status: Former Smoker    Packs/day: 1.50    Years: 28.00    Pack years: 42.00    Types: Cigarettes    Quit date: 08/14/2000    Years since quitting: 20.4  . Smokeless tobacco: Never Used  . Tobacco comment: smoking cessatiuon materials not required  Vaping Use  . Vaping Use: Never used  Substance Use Topics  . Alcohol use: No  . Drug use: No     Medication list has been reviewed and updated.  Current Meds  Medication Sig  . albuterol (PROVENTIL HFA;VENTOLIN HFA) 108 (90 Base) MCG/ACT inhaler Inhale 2 puffs into the lungs every 6 (six) hours as needed for wheezing or shortness of breath.  Marland Kitchen albuterol (PROVENTIL) (2.5 MG/3ML) 0.083% nebulizer solution Take 3 mLs (2.5 mg total) by nebulization every 6 (six) hours as needed for wheezing or shortness of breath.  Marland Kitchen aspirin EC 81 MG tablet Take by mouth.  Marland Kitchen atorvastatin (LIPITOR) 40 MG tablet Take 1 tablet (40 mg total) by mouth daily.  . celecoxib (CELEBREX)  200 MG capsule Take 200 mg by mouth daily. mundy  . cloNIDine (CATAPRES) 0.1 MG tablet Take 1 tablet (0.1 mg total) by mouth 2 (two) times daily as needed. (Patient taking differently: Take 0.1 mg by mouth 2 (two) times daily as needed. Takes 1/2 tablet as needed twice a day)  . gabapentin (NEURONTIN) 100 MG capsule Take 2 capsules by mouth at bedtime. mundy  . gemfibrozil  (LOPID) 600 MG tablet Take 1 tablet (600 mg total) by mouth 2 (two) times daily.  Marland Kitchen glipiZIDE (GLUCOTROL) 5 MG tablet Take 5 mg by mouth every morning.  . hydrochlorothiazide (HYDRODIURIL) 25 MG tablet TAKE 1 TABLET BY MOUTH EVERY DAY  . Investigational - Study Medication Study name: Diabetes medication - oral tablet once daily Additional study details: Dr. Dareen Piano with [place]  . metFORMIN (GLUCOPHAGE) 500 MG tablet Take 500 mg by mouth 2 (two) times daily.  . metoprolol tartrate (LOPRESSOR) 50 MG tablet TAKE 1 TABLET(50 MG) BY MOUTH TWICE DAILY  . montelukast (SINGULAIR) 10 MG tablet Take 1 tablet (10 mg total) by mouth daily.  Marland Kitchen omeprazole (PRILOSEC) 20 MG capsule Take 20 mg by mouth daily. otc  . predniSONE (DELTASONE) 10 MG tablet Take 1 tablet by mouth daily. Meredeth Ide  . [DISCONTINUED] cyclobenzaprine (FLEXERIL) 10 MG tablet Take 1 tablet (10 mg total) by mouth 3 (three) times daily as needed for muscle spasms.    PHQ 2/9 Scores 01/29/2021 07/31/2020 05/02/2020 04/24/2020  PHQ - 2 Score 0 0 0 0  PHQ- 9 Score 0 0 0 0    GAD 7 : Generalized Anxiety Score 01/29/2021 07/31/2020 05/02/2020 04/24/2020  Nervous, Anxious, on Edge 0 0 0 0  Control/stop worrying 0 0 0 0  Worry too much - different things 0 0 0 0  Trouble relaxing 0 0 0 0  Restless 0 0 0 0  Easily annoyed or irritable 0 0 0 0  Afraid - awful might happen 0 0 0 0  Total GAD 7 Score 0 0 0 0  Anxiety Difficulty - - - -    BP Readings from Last 3 Encounters:  01/29/21 (!) 150/84  09/15/20 124/88  09/13/20 (!) 192/113    Physical Exam Vitals and nursing note reviewed.  Constitutional:      Appearance: She is well-developed and well-nourished.  HENT:     Head: Normocephalic.     Right Ear: Tympanic membrane and external ear normal.     Left Ear: Tympanic membrane and external ear normal.     Nose: Nose normal.     Mouth/Throat:     Mouth: Oropharynx is clear and moist.  Eyes:     General: Lids are everted, no foreign  bodies appreciated. No scleral icterus.       Left eye: No foreign body or hordeolum.     Extraocular Movements: EOM normal.     Conjunctiva/sclera: Conjunctivae normal.     Right eye: Right conjunctiva is not injected.     Left eye: Left conjunctiva is not injected.     Pupils: Pupils are equal, round, and reactive to light.  Neck:     Thyroid: No thyromegaly.     Vascular: No JVD.     Trachea: No tracheal deviation.  Cardiovascular:     Rate and Rhythm: Normal rate and regular rhythm.     Pulses: Normal pulses and intact distal pulses.     Heart sounds: Normal heart sounds, S1 normal and S2 normal. No murmur heard.  No systolic murmur is present.  No diastolic murmur is present. No friction rub. No gallop. No S3 or S4 sounds.   Pulmonary:     Effort: Pulmonary effort is normal. No respiratory distress.     Breath sounds: Normal breath sounds. No wheezing, rhonchi or rales.  Abdominal:     General: Bowel sounds are normal.     Palpations: Abdomen is soft. There is no hepatosplenomegaly or mass.     Tenderness: There is no abdominal tenderness. There is no guarding or rebound.  Musculoskeletal:        General: No tenderness or edema. Normal range of motion.     Cervical back: Normal range of motion and neck supple.     Right lower leg: No edema.     Left lower leg: No edema.  Lymphadenopathy:     Cervical: No cervical adenopathy.  Skin:    General: Skin is warm.     Findings: No rash.  Neurological:     Mental Status: She is alert and oriented to person, place, and time.     Cranial Nerves: No cranial nerve deficit.     Deep Tendon Reflexes: Strength normal. Reflexes normal.  Psychiatric:        Mood and Affect: Mood and affect normal. Mood is not anxious or depressed.     Wt Readings from Last 3 Encounters:  01/29/21 153 lb (69.4 kg)  09/15/20 146 lb (66.2 kg)  09/12/20 148 lb (67.1 kg)    BP (!) 150/84   Pulse 82   Ht 5\' 1"  (1.549 m)   Wt 153 lb (69.4 kg)   BMI  28.91 kg/m   Assessment and Plan:  1. Essential hypertension Chronic.  Controlled.  Stable.  Blood pressure is 150/84.  We will continue her hydrochlorothiazide 25 mg once a day metoprolol 50 mg twice a day but we will also add valsartan 40 mg 1 tablet daily.  Recheck in 6 months - hydrochlorothiazide (HYDRODIURIL) 25 MG tablet; Take 1 tablet (25 mg total) by mouth daily.  Dispense: 90 tablet; Refill: 1 - metoprolol tartrate (LOPRESSOR) 50 MG tablet; TAKE 1 TABLET(50 MG) BY MOUTH TWICE DAILY  Dispense: 180 tablet; Refill: 1 - valsartan (DIOVAN) 40 MG tablet; Take 1 tablet (40 mg total) by mouth daily.  Dispense: 90 tablet; Refill: 0  2. Hyperlipidemia, unspecified hyperlipidemia type Chronic.  Controlled.  Stable.  Continue atorvastatin 40 mg once a day and gemfibrozil 600 mg 1 twice a day. - atorvastatin (LIPITOR) 40 MG tablet; Take 1 tablet (40 mg total) by mouth daily.  Dispense: 90 tablet; Refill: 1 - gemfibrozil (LOPID) 600 MG tablet; Take 1 tablet (600 mg total) by mouth 2 (two) times daily.  Dispense: 180 tablet; Refill: 1  3. Seasonal allergic rhinitis due to pollen Chronic.  Controlled.  Stable.  Continue Singulair 10 mg once a day. - montelukast (SINGULAIR) 10 MG tablet; Take 1 tablet (10 mg total) by mouth daily.  Dispense: 90 tablet; Refill: 1

## 2021-01-30 ENCOUNTER — Other Ambulatory Visit: Payer: Self-pay | Admitting: Family Medicine

## 2021-01-30 DIAGNOSIS — I10 Essential (primary) hypertension: Secondary | ICD-10-CM

## 2021-02-02 ENCOUNTER — Other Ambulatory Visit: Payer: Self-pay

## 2021-02-02 ENCOUNTER — Ambulatory Visit: Payer: Self-pay | Admitting: *Deleted

## 2021-02-02 DIAGNOSIS — I10 Essential (primary) hypertension: Secondary | ICD-10-CM

## 2021-02-02 MED ORDER — AMLODIPINE BESYLATE 2.5 MG PO TABS: 2.5000 mg | ORAL_TABLET | Freq: Every day | ORAL | 0 refills | Status: DC

## 2021-02-02 NOTE — Telephone Encounter (Signed)
Pt to switch over to Amlodipine 2.5 mg in place of valsartan- will recheck b/p in office on Friday

## 2021-02-02 NOTE — Telephone Encounter (Signed)
Patient started Valsartan 40 mg on Thursday of last week. Saturday she began feeling shaky with HA. Sunday morning had blurred vision. Improved as the day went on plus used a lubricant in eyes. She did not take Valsartan last night, this morning eyes are less blurry but itchy.She had eyedrops given to her last year so used those today. HA and shakiness resolved after one day. Denies CP/SOB/weakness/slurred speech/dizziness/rash. Patient reported she had reaction to Losartan in the past.   Advised to hold Valsartan until we call with  physician recommendation. Increase water intake today to flush system. Reviewed symptoms she should seek immediate evaluation with if noticed. Verbalized understanding. Routing to pcp for recommendation. Reason for Disposition . [1] Caller has URGENT medicine question about med that PCP or specialist prescribed AND [2] triager unable to answer question  Answer Assessment - Initial Assessment Questions 1. NAME of MEDICATION: "What medicine are you calling about?"     Valsartan. Started taking on Thursday 2. QUESTION: "What is your question?" (e.g., medication refill, side effect)     Thinks blurred vision is reaction from Valsartan 3. PRESCRIBING HCP: "Who prescribed it?" Reason: if prescribed by specialist, call should be referred to that group.     Dr. Yetta Barre added this medication last week.  4. SYMPTOMS: "Do you have any symptoms?"     Mild blurred vision, is less than yesterday. 5. SEVERITY: If symptoms are present, ask "Are they mild, moderate or severe?"     mild 6. PREGNANCY:  "Is there any chance that you are pregnant?" "When was your last menstrual period?"     na  Protocols used: MEDICATION QUESTION CALL-A-AH

## 2021-02-02 NOTE — Progress Notes (Signed)
Sent in amlodipine in place of valsartan

## 2021-02-06 ENCOUNTER — Other Ambulatory Visit: Payer: Self-pay

## 2021-02-06 ENCOUNTER — Encounter: Payer: Self-pay | Admitting: Family Medicine

## 2021-02-06 ENCOUNTER — Ambulatory Visit (INDEPENDENT_AMBULATORY_CARE_PROVIDER_SITE_OTHER): Payer: Medicare Other | Admitting: Family Medicine

## 2021-02-06 VITALS — BP 132/80 | HR 78 | Ht 61.0 in | Wt 151.0 lb

## 2021-02-06 DIAGNOSIS — I10 Essential (primary) hypertension: Secondary | ICD-10-CM | POA: Diagnosis not present

## 2021-02-06 MED ORDER — AMLODIPINE BESYLATE 2.5 MG PO TABS: 2.5000 mg | ORAL_TABLET | Freq: Every day | ORAL | 1 refills | Status: DC

## 2021-02-06 NOTE — Progress Notes (Signed)
Date:  02/06/2021   Name:  Judy Bolton   DOB:  10/10/1946   MRN:  [id number]   Chief Complaint: Hypertension (Added amlodipine to regimen)  Hypertension This is a chronic problem. The current episode started more than 1 year ago. The problem has been gradually improving since onset. The problem is controlled. Pertinent negatives include no anxiety, blurred vision, chest pain, headaches, malaise/fatigue, neck pain, orthopnea, palpitations, peripheral edema, PND, shortness of breath or sweats. There are no associated agents to hypertension. Past treatments include central alpha agonists, calcium channel blockers, diuretics and beta blockers. The current treatment provides moderate improvement. There are no compliance problems.  There is no history of angina, kidney disease, CAD/MI, CVA, heart failure, left ventricular hypertrophy or PVD.    Lab Results  Component Value Date   CREATININE 1.32 (H) 09/12/2020   BUN 37 (H) 09/12/2020   NA 135 09/12/2020   K 3.9 09/12/2020   CL 99 09/12/2020   CO2 26 09/12/2020   Lab Results  Component Value Date   CHOL 185 01/24/2018   HDL 66 01/24/2018   LDLCALC 88 01/24/2018   TRIG 154 (H) 01/24/2018   CHOLHDL 2.8 01/24/2018   No results found for: TSH Lab Results  Component Value Date   HGBA1C 7.2 12/13/2020   Lab Results  Component Value Date   WBC 11.8 (H) 09/12/2020   HGB 13.4 09/12/2020   HCT 38.9 09/12/2020   MCV 85.7 09/12/2020   PLT 252 09/12/2020   Lab Results  Component Value Date   ALT 18 09/12/2020   AST 18 09/12/2020   ALKPHOS 61 09/12/2020   BILITOT 0.6 09/12/2020     Review of Systems  Constitutional: Negative.  Negative for chills, fatigue, fever, malaise/fatigue and unexpected weight change.  HENT: Negative for congestion, ear discharge, ear pain, rhinorrhea, sinus pressure, sneezing and sore throat.   Eyes: Negative for blurred vision, double vision, photophobia, pain, discharge, redness and itching.   Respiratory: Negative for cough, shortness of breath, wheezing and stridor.   Cardiovascular: Negative for chest pain, palpitations, orthopnea and PND.  Gastrointestinal: Negative for abdominal pain, blood in stool, constipation, diarrhea, nausea and vomiting.  Endocrine: Negative for cold intolerance, heat intolerance, polydipsia, polyphagia and polyuria.  Genitourinary: Negative for dysuria, flank pain, frequency, hematuria, menstrual problem, pelvic pain, urgency, vaginal bleeding and vaginal discharge.  Musculoskeletal: Negative for arthralgias, back pain, myalgias and neck pain.  Skin: Negative for rash.  Allergic/Immunologic: Negative for environmental allergies and food allergies.  Neurological: Negative for dizziness, weakness, light-headedness, numbness and headaches.  Hematological: Negative for adenopathy. Does not bruise/bleed easily.  Psychiatric/Behavioral: Negative for dysphoric mood. The patient is not nervous/anxious.     Patient Active Problem List   Diagnosis Date Noted  . Essential hypertension 08/30/2017  . Gastroesophageal reflux disease 08/30/2017  . Aortic atherosclerosis (HCC) 05/31/2017  . PAD (peripheral artery disease) (HCC) 05/31/2017  . Encounter for long-term (current) use of high-risk medication 12/28/2016  . Bilateral hand pain 11/08/2016  . Elevated C-reactive protein 11/08/2016  . Swelling of joint of right hand 11/08/2016  . Type 2 diabetes mellitus with microalbuminuria, without long-term current use of insulin (HCC) 07/06/2016  . Acute anxiety 04/16/2016  . Bronchitis 04/16/2016  . Centrilobular emphysema (HCC) 04/16/2016  . TIA (transient ischemic attack) 02/29/2016  . Lumbar stenosis with neurogenic claudication 04/16/2015  . Spondylosis of lumbar region without myelopathy or radiculopathy 04/16/2015  . Hyperlipidemia 10/15/2014    Allergies  Allergen Reactions  .  Betadine [Povidone Iodine] Swelling  . Codeine Itching  . Latex Itching and  Swelling    (urinary catheter)  . Losartan     intolerance  . Meloxicam Swelling  . Penicillins Other (See Comments)    Paralysis  Has patient had a PCN reaction causing immediate rash, facial/tongue/throat swelling, SOB or lightheadedness with hypotension: No Has patient had a PCN reaction causing severe rash involving mucus membranes or skin necrosis: No Has patient had a PCN reaction that required hospitalization Yes Has patient had a PCN reaction occurring within the last 10 years: No If all of the above answers are "NO", then may proceed with Cephalosporin use.  . Valsartan     Past Surgical History:  Procedure Laterality Date  . ABDOMINAL HYSTERECTOMY    . ANTERIOR (CYSTOCELE) AND POSTERIOR REPAIR (RECTOCELE) WITH XENFORM GRAFT AND SACROSPINOUS FIXATION    . APPENDECTOMY    . BROW LIFT Bilateral 09/05/2018   Procedure: BLEPHAROPLASTY UPPER EYELID W/EXCESS SKIN;  Surgeon: Imagene Riches, MD;  Location: [facility];  Service: Ophthalmology;  Laterality: Bilateral;  . CTR Bilateral   . LUMBAR LAMINECTOMY/DECOMPRESSION MICRODISCECTOMY N/A 07/27/2017   Procedure: LUMBAR LAMINECTOMY/DECOMPRESSION MICRODISCECTOMY 3 LEVELS-L3-S1;  Surgeon: Venetia Night, MD;  Location: [facility];  Service: Neurosurgery;  Laterality: N/A;  . PTOSIS REPAIR Bilateral 09/05/2018   Procedure: PTOSIS REPAIR RESECT EX;  Surgeon: Imagene Riches, MD;  Location: [facility];  Service: Ophthalmology;  Laterality: Bilateral;  Diabetic - oral meds sleep apnea Latexd allergy  . ROTATOR CUFF REPAIR Right     Social History   Tobacco Use  . Smoking status: Former Smoker    Packs/day: 1.50    Years: 28.00    Pack years: 42.00    Types: Cigarettes    Quit date: 08/14/2000    Years since quitting: 20.4  . Smokeless tobacco: Never Used  . Tobacco comment: smoking cessatiuon materials not required  Vaping Use  . Vaping Use: Never used  Substance Use Topics  . Alcohol use: No  . Drug use: No      Medication list has been reviewed and updated.  Current Meds  Medication Sig  . albuterol (PROVENTIL HFA;VENTOLIN HFA) 108 (90 Base) MCG/ACT inhaler Inhale 2 puffs into the lungs every 6 (six) hours as needed for wheezing or shortness of breath.  Marland Kitchen albuterol (PROVENTIL) (2.5 MG/3ML) 0.083% nebulizer solution Take 3 mLs (2.5 mg total) by nebulization every 6 (six) hours as needed for wheezing or shortness of breath.  Marland Kitchen amLODipine (NORVASC) 2.5 MG tablet Take 1 tablet (2.5 mg total) by mouth daily.  Marland Kitchen aspirin EC 81 MG tablet Take by mouth.  Marland Kitchen atorvastatin (LIPITOR) 40 MG tablet Take 1 tablet (40 mg total) by mouth daily.  . celecoxib (CELEBREX) 200 MG capsule Take 200 mg by mouth daily. mundy  . cloNIDine (CATAPRES) 0.1 MG tablet Take 1 tablet (0.1 mg total) by mouth 2 (two) times daily as needed. (Patient taking differently: Take 0.1 mg by mouth 2 (two) times daily as needed. Takes 1/2 tablet as needed twice a day)  . gemfibrozil (LOPID) 600 MG tablet Take 1 tablet (600 mg total) by mouth 2 (two) times daily.  Marland Kitchen glipiZIDE (GLUCOTROL) 5 MG tablet Take 5 mg by mouth every morning.  . hydrochlorothiazide (HYDRODIURIL) 25 MG tablet Take 1 tablet (25 mg total) by mouth daily.  . Investigational - Study Medication Study name: Diabetes medication - oral tablet once daily Additional study details: Dr. Dareen Piano with [facility]  .  metFORMIN (GLUCOPHAGE) 500 MG tablet Take 500 mg by mouth 2 (two) times daily.  . metoprolol tartrate (LOPRESSOR) 50 MG tablet TAKE 1 TABLET(50 MG) BY MOUTH TWICE DAILY  . montelukast (SINGULAIR) 10 MG tablet Take 1 tablet (10 mg total) by mouth daily.  Marland Kitchen omeprazole (PRILOSEC) 20 MG capsule Take 20 mg by mouth daily. otc  . predniSONE (DELTASONE) 10 MG tablet Take 1 tablet by mouth daily. [place]    [facility] 2/9 Scores 01/29/2021 07/31/2020 05/02/2020 04/24/2020  PHQ - 2 Score 0 0 0 0  PHQ- 9 Score 0 0 0 0    GAD 7 : Generalized Anxiety Score 01/29/2021 07/31/2020 05/02/2020  04/24/2020  Nervous, Anxious, on Edge 0 0 0 0  Control/stop worrying 0 0 0 0  Worry too much - different things 0 0 0 0  Trouble relaxing 0 0 0 0  Restless 0 0 0 0  Easily annoyed or irritable 0 0 0 0  Afraid - awful might happen 0 0 0 0  Total GAD 7 Score 0 0 0 0  Anxiety Difficulty - - - -    BP Readings from Last 3 Encounters:  02/06/21 132/80  01/29/21 (!) 150/84  09/15/20 124/88    Physical Exam Vitals and nursing note reviewed.  Constitutional:      Appearance: She is well-developed and well-nourished.  HENT:     Head: Normocephalic.     Right Ear: External ear normal.     Left Ear: External ear normal.     Mouth/Throat:     Mouth: Oropharynx is clear and moist.  Eyes:     General: Lids are everted, no foreign bodies appreciated. No scleral icterus.       Left eye: No foreign body or hordeolum.     Extraocular Movements: EOM normal.     Conjunctiva/sclera: Conjunctivae normal.     Right eye: Right conjunctiva is not injected.     Left eye: Left conjunctiva is not injected.     Pupils: Pupils are equal, round, and reactive to light.  Neck:     Thyroid: No thyromegaly.     Vascular: No JVD.     Trachea: No tracheal deviation.  Cardiovascular:     Rate and Rhythm: Normal rate and regular rhythm.     Pulses: Intact distal pulses.     Heart sounds: Normal heart sounds. No murmur heard. No friction rub. No gallop.   Pulmonary:     Effort: Pulmonary effort is normal. No respiratory distress.     Breath sounds: Normal breath sounds. No wheezing, rhonchi or rales.  Abdominal:     General: Bowel sounds are normal.     Palpations: Abdomen is soft. There is no hepatosplenomegaly or mass.     Tenderness: There is no abdominal tenderness. There is no guarding or rebound.  Musculoskeletal:        General: No tenderness or edema. Normal range of motion.     Cervical back: Normal range of motion and neck supple.  Lymphadenopathy:     Cervical: No cervical adenopathy.   Skin:    General: Skin is warm.     Findings: No rash.  Neurological:     Mental Status: She is alert and oriented to person, place, and time.     Cranial Nerves: No cranial nerve deficit.     Deep Tendon Reflexes: Strength normal. Reflexes normal.  Psychiatric:        Mood and Affect: Mood and affect normal. Mood  is not anxious or depressed.     Wt Readings from Last 3 Encounters:  02/06/21 151 lb (68.5 kg)  01/29/21 153 lb (69.4 kg)  09/15/20 146 lb (66.2 kg)    BP 132/80   Pulse 78   Ht  (1.549 m)   Wt 151 lb (68.5 kg)   BMI 28.53 kg/m   Assessment and Plan: 1. Essential hypertension Chronic.  Controlled.  Stable.  Blood pressure today is excellent at 132/80.  We will continue amlodipine 2.5 mg once a day along with her previous prescribed antihypertensive medication regimen. - amLODipine (NORVASC) 2.5 MG tablet; Take 1 tablet (2.5 mg total) by mouth daily.  Dispense: 90 tablet; Refill: 1

## 2021-02-09 ENCOUNTER — Ambulatory Visit: Payer: Medicare HMO

## 2021-03-01 ENCOUNTER — Other Ambulatory Visit: Payer: Self-pay | Admitting: Family Medicine

## 2021-03-01 DIAGNOSIS — I10 Essential (primary) hypertension: Secondary | ICD-10-CM

## 2021-03-01 NOTE — Telephone Encounter (Signed)
Last ordered on 02/06/21 #90 tabs and one refill. Refill not needed at this time.Refused.

## 2021-03-05 ENCOUNTER — Other Ambulatory Visit: Payer: Self-pay | Admitting: Family Medicine

## 2021-03-05 DIAGNOSIS — I10 Essential (primary) hypertension: Secondary | ICD-10-CM

## 2021-03-05 NOTE — Telephone Encounter (Signed)
Requested Prescriptions  Pending Prescriptions Disp Refills  . hydrochlorothiazide (HYDRODIURIL) 25 MG tablet [Pharmacy Med Name: HYDROCHLOROTHIAZIDE 25 MG TAB] 90 tablet 1    Sig: TAKE 1 TABLET BY MOUTH EVERY DAY     Cardiovascular: Diuretics - Thiazide Failed - 03/05/2021  1:56 AM      Failed - Cr in normal range and within 360 days    Creatinine  Date Value Ref Range Status  02/29/2012 1.16 0.60 - 1.30 mg/dL Final   Creatinine, Ser  Date Value Ref Range Status  09/12/2020 1.32 (H) 0.44 - 1.00 mg/dL Final         Passed - Ca in normal range and within 360 days    Calcium  Date Value Ref Range Status  09/12/2020 9.9 8.9 - 10.3 mg/dL Final   Calcium, Total  Date Value Ref Range Status  02/29/2012 9.2 8.5 - 10.1 mg/dL Final         Passed - K in normal range and within 360 days    Potassium  Date Value Ref Range Status  09/12/2020 3.9 3.5 - 5.1 mmol/L Final  08/08/2014 4.7 3.5 - 5.1 mmol/L Final         Passed - Na in normal range and within 360 days    Sodium  Date Value Ref Range Status  09/12/2020 135 135 - 145 mmol/L Final  01/24/2018 141 134 - 144 mmol/L Final  02/29/2012 142 136 - 145 mmol/L Final         Passed - Last BP in normal range    BP Readings from Last 1 Encounters:  02/06/21 132/80         Passed - Valid encounter within last 6 months    Recent Outpatient Visits          3 weeks ago Essential hypertension   [facility] Duanne Limerick, MD   1 month ago Essential hypertension   [facility] Duanne Limerick, MD   5 months ago Essential hypertension   [facility] Duanne Limerick, MD   7 months ago Essential hypertension   [facility] Duanne Limerick, MD   10 months ago COPD with acute bronchitis [facility])   [facility] Duanne Limerick, MD      Future Appointments            In 1 month Duanne Limerick, MD [facility]

## 2021-03-09 ENCOUNTER — Other Ambulatory Visit: Payer: Self-pay

## 2021-03-09 ENCOUNTER — Encounter: Payer: Self-pay | Admitting: Family Medicine

## 2021-03-09 ENCOUNTER — Ambulatory Visit (INDEPENDENT_AMBULATORY_CARE_PROVIDER_SITE_OTHER): Payer: Medicare Other

## 2021-03-09 ENCOUNTER — Ambulatory Visit (INDEPENDENT_AMBULATORY_CARE_PROVIDER_SITE_OTHER): Payer: Medicare Other | Admitting: Family Medicine

## 2021-03-09 VITALS — BP 130/82 | HR 68 | Ht 61.0 in | Wt 148.0 lb

## 2021-03-09 VITALS — BP 146/80 | HR 78 | Temp 97.9°F | Resp 16 | Ht 61.0 in | Wt 148.6 lb

## 2021-03-09 DIAGNOSIS — Z1231 Encounter for screening mammogram for malignant neoplasm of breast: Secondary | ICD-10-CM

## 2021-03-09 DIAGNOSIS — R350 Frequency of micturition: Secondary | ICD-10-CM

## 2021-03-09 DIAGNOSIS — N3090 Cystitis, unspecified without hematuria: Secondary | ICD-10-CM

## 2021-03-09 DIAGNOSIS — Z780 Asymptomatic menopausal state: Secondary | ICD-10-CM

## 2021-03-09 DIAGNOSIS — Z0000 Encounter for general adult medical examination without abnormal findings: Secondary | ICD-10-CM | POA: Diagnosis not present

## 2021-03-09 LAB — POCT URINALYSIS DIPSTICK
Bilirubin, UA: NEGATIVE
Blood, UA: NEGATIVE
Glucose, UA: NEGATIVE
Ketones, UA: NEGATIVE
Leukocytes, UA: NEGATIVE
Nitrite, UA: NEGATIVE
Protein, UA: NEGATIVE
Spec Grav, UA: 1.01 (ref 1.010–1.025)
Urobilinogen, UA: 0.2 E.U./dL
pH, UA: 5 (ref 5.0–8.0)

## 2021-03-09 MED ORDER — NITROFURANTOIN MONOHYD MACRO 100 MG PO CAPS: 100.0000 mg | ORAL_CAPSULE | Freq: Two times a day (BID) | ORAL | 0 refills | Status: DC

## 2021-03-09 NOTE — Progress Notes (Signed)
Date:  03/09/2021   Name:  Judy Bolton   DOB:  December 21, 1945   MRN:  [id number]   Chief Complaint: Abdominal Pain (Is hurting in lower back and abdomen- urine looks good)  Abdominal Pain This is a new problem. The current episode started in the past 7 days. The problem occurs daily. The problem has been waxing and waning. The pain is located in the suprapubic region. The pain is at a severity of 7/10. The pain is mild. The quality of the pain is a sensation of fullness. Associated symptoms include frequency. Pertinent negatives include no anorexia, arthralgias, belching, constipation, diarrhea, dysuria, fever, flatus, headaches, hematochezia, hematuria, melena, myalgias, nausea, vomiting or weight loss. Nothing aggravates the pain. Relieved by: urination. She has tried nothing for the symptoms.  Urinary Tract Infection  This is a new problem. The current episode started in the past 7 days. The problem has been waxing and waning. The quality of the pain is described as aching. The pain is moderate. There has been no fever. Associated symptoms include frequency and urgency. Pertinent negatives include no chills, discharge, flank pain, hematuria, hesitancy, nausea or vomiting. She has tried nothing for the symptoms.    Lab Results  Component Value Date   CREATININE 1.32 (H) 09/12/2020   BUN 37 (H) 09/12/2020   NA 135 09/12/2020   K 3.9 09/12/2020   CL 99 09/12/2020   CO2 26 09/12/2020   Lab Results  Component Value Date   CHOL 185 01/24/2018   HDL 66 01/24/2018   LDLCALC 88 01/24/2018   TRIG 154 (H) 01/24/2018   CHOLHDL 2.8 01/24/2018   No results found for: TSH Lab Results  Component Value Date   HGBA1C 7.2 12/13/2020   Lab Results  Component Value Date   WBC 11.8 (H) 09/12/2020   HGB 13.4 09/12/2020   HCT 38.9 09/12/2020   MCV 85.7 09/12/2020   PLT 252 09/12/2020   Lab Results  Component Value Date   ALT 18 09/12/2020   AST 18 09/12/2020   ALKPHOS 61 09/12/2020    BILITOT 0.6 09/12/2020     Review of Systems  Constitutional: Negative.  Negative for chills, fatigue, fever, unexpected weight change and weight loss.  HENT: Negative for congestion, ear discharge, ear pain, rhinorrhea, sinus pressure, sneezing and sore throat.   Eyes: Negative for photophobia, pain, discharge, redness and itching.  Respiratory: Negative for cough, shortness of breath, wheezing and stridor.   Cardiovascular: Negative for chest pain, palpitations and leg swelling.  Gastrointestinal: Positive for abdominal pain. Negative for anorexia, blood in stool, constipation, diarrhea, flatus, hematochezia, melena, nausea and vomiting.  Endocrine: Negative for cold intolerance, heat intolerance, polydipsia, polyphagia and polyuria.  Genitourinary: Positive for frequency and urgency. Negative for dysuria, flank pain, hematuria, hesitancy, menstrual problem, pelvic pain, vaginal bleeding and vaginal discharge.  Musculoskeletal: Negative for arthralgias, back pain and myalgias.  Skin: Negative for rash.  Allergic/Immunologic: Negative for environmental allergies and food allergies.  Neurological: Negative for dizziness, weakness, light-headedness, numbness and headaches.  Hematological: Negative for adenopathy. Does not bruise/bleed easily.  Psychiatric/Behavioral: Negative for dysphoric mood. The patient is not nervous/anxious.     Patient Active Problem List   Diagnosis Date Noted  . Essential hypertension 08/30/2017  . Gastroesophageal reflux disease 08/30/2017  . Aortic atherosclerosis (HCC) 05/31/2017  . PAD (peripheral artery disease) (HCC) 05/31/2017  . Encounter for long-term (current) use of high-risk medication 12/28/2016  . Seronegative rheumatoid arthritis (HCC) 12/28/2016  . Bilateral  hand pain 11/08/2016  . Elevated C-reactive protein 11/08/2016  . Swelling of joint of right hand 11/08/2016  . Type 2 diabetes mellitus with microalbuminuria, without long-term current use  of insulin (HCC) 07/06/2016  . Acute anxiety 04/16/2016  . Bronchitis 04/16/2016  . Centrilobular emphysema (HCC) 04/16/2016  . TIA (transient ischemic attack) 02/29/2016  . Lumbar stenosis with neurogenic claudication 04/16/2015  . Spondylosis of lumbar region without myelopathy or radiculopathy 04/16/2015  . Hyperlipidemia 10/15/2014    Allergies  Allergen Reactions  . Betadine [Povidone Iodine] Swelling  . Codeine Itching  . Latex Itching and Swelling    (urinary catheter)  . Losartan     intolerance  . Meloxicam Swelling  . Penicillins Other (See Comments)    Paralysis  Has patient had a PCN reaction causing immediate rash, facial/tongue/throat swelling, SOB or lightheadedness with hypotension: No Has patient had a PCN reaction causing severe rash involving mucus membranes or skin necrosis: No Has patient had a PCN reaction that required hospitalization Yes Has patient had a PCN reaction occurring within the last 10 years: No If all of the above answers are "NO", then may proceed with Cephalosporin use.  . Valsartan     Past Surgical History:  Procedure Laterality Date  . ABDOMINAL HYSTERECTOMY    . ANTERIOR (CYSTOCELE) AND POSTERIOR REPAIR (RECTOCELE) WITH XENFORM GRAFT AND SACROSPINOUS FIXATION    . APPENDECTOMY    . BROW LIFT Bilateral 09/05/2018   Procedure: BLEPHAROPLASTY UPPER EYELID W/EXCESS SKIN;  Surgeon: Imagene Riches, MD;  Location: [facility];  Service: Ophthalmology;  Laterality: Bilateral;  . CTR Bilateral   . LUMBAR LAMINECTOMY/DECOMPRESSION MICRODISCECTOMY N/A 07/27/2017   Procedure: LUMBAR LAMINECTOMY/DECOMPRESSION MICRODISCECTOMY 3 LEVELS-L3-S1;  Surgeon: Venetia Night, MD;  Location: [facility];  Service: Neurosurgery;  Laterality: N/A;  . PTOSIS REPAIR Bilateral 09/05/2018   Procedure: PTOSIS REPAIR RESECT EX;  Surgeon: Imagene Riches, MD;  Location: [facility];  Service: Ophthalmology;  Laterality: Bilateral;  Diabetic - oral  meds sleep apnea Latexd allergy  . ROTATOR CUFF REPAIR Right     Social History   Tobacco Use  . Smoking status: Former Smoker    Packs/day: 1.50    Years: 28.00    Pack years: 42.00    Types: Cigarettes    Quit date: 08/14/2000    Years since quitting: 20.5  . Smokeless tobacco: Never Used  . Tobacco comment: smoking cessatiuon materials not required  Vaping Use  . Vaping Use: Never used  Substance Use Topics  . Alcohol use: No  . Drug use: No     Medication list has been reviewed and updated.  Current Meds  Medication Sig  . albuterol (PROVENTIL HFA;VENTOLIN HFA) 108 (90 Base) MCG/ACT inhaler Inhale 2 puffs into the lungs every 6 (six) hours as needed for wheezing or shortness of breath.  Marland Kitchen albuterol (PROVENTIL) (2.5 MG/3ML) 0.083% nebulizer solution Take 3 mLs (2.5 mg total) by nebulization every 6 (six) hours as needed for wheezing or shortness of breath.  Marland Kitchen amLODipine (NORVASC) 2.5 MG tablet Take 1 tablet (2.5 mg total) by mouth daily.  Marland Kitchen aspirin EC 81 MG tablet Take by mouth.  Marland Kitchen atorvastatin (LIPITOR) 40 MG tablet Take 1 tablet (40 mg total) by mouth daily.  . celecoxib (CELEBREX) 200 MG capsule Take 200 mg by mouth daily. mundy  . cholecalciferol (VITAMIN D3) 25 MCG (1000 UNIT) tablet Take 1,000 Units by mouth daily.  . cloNIDine (CATAPRES) 0.1 MG tablet Take 1 tablet (0.1  mg total) by mouth 2 (two) times daily as needed. (Patient taking differently: Take 0.1 mg by mouth 2 (two) times daily as needed. Takes 1/2 tablet as needed twice a day)  . docusate sodium (COLACE) 100 MG capsule Take 100 mg by mouth daily.  Marland Kitchen gabapentin (NEURONTIN) 100 MG capsule Take 2 capsules by mouth at bedtime. mundy  . gemfibrozil (LOPID) 600 MG tablet Take 1 tablet (600 mg total) by mouth 2 (two) times daily.  Marland Kitchen glipiZIDE (GLUCOTROL) 5 MG tablet Take 5 mg by mouth every morning.  . hydrochlorothiazide (HYDRODIURIL) 25 MG tablet TAKE 1 TABLET BY MOUTH EVERY DAY  . Investigational - Study  Medication Study name: Diabetes medication - oral tablet once daily Additional study details: Dr. Dareen Piano with [facility]  . ipratropium (ATROVENT) 0.02 % nebulizer solution Inhale into the lungs.  . metFORMIN (GLUCOPHAGE) 500 MG tablet Take 500 mg by mouth 2 (two) times daily.  . metoprolol tartrate (LOPRESSOR) 50 MG tablet TAKE 1 TABLET(50 MG) BY MOUTH TWICE DAILY  . montelukast (SINGULAIR) 10 MG tablet Take 1 tablet (10 mg total) by mouth daily.  Marland Kitchen omeprazole (PRILOSEC) 20 MG capsule Take 20 mg by mouth daily. otc  . predniSONE (DELTASONE) 10 MG tablet Take 1 tablet by mouth daily. Meredeth Ide  . vitamin B-12 (CYANOCOBALAMIN) 1000 MCG tablet Take 1,000 mcg by mouth daily.    PHQ 2/9 Scores 03/09/2021 01/29/2021 07/31/2020 05/02/2020  PHQ - 2 Score 0 0 0 0  PHQ- 9 Score - 0 0 0    GAD 7 : Generalized Anxiety Score 01/29/2021 07/31/2020 05/02/2020 04/24/2020  Nervous, Anxious, on Edge 0 0 0 0  Control/stop worrying 0 0 0 0  Worry too much - different things 0 0 0 0  Trouble relaxing 0 0 0 0  Restless 0 0 0 0  Easily annoyed or irritable 0 0 0 0  Afraid - awful might happen 0 0 0 0  Total GAD 7 Score 0 0 0 0  Anxiety Difficulty - - - -    BP Readings from Last 3 Encounters:  03/09/21 130/82  03/09/21 (!) 146/80  02/06/21 132/80    Physical Exam Vitals and nursing note reviewed.  Constitutional:      Appearance: She is well-developed.  HENT:     Head: Normocephalic.     Right Ear: External ear normal.     Left Ear: External ear normal.  Eyes:     General: Lids are everted, no foreign bodies appreciated. No scleral icterus.       Left eye: No foreign body or hordeolum.     Conjunctiva/sclera: Conjunctivae normal.     Right eye: Right conjunctiva is not injected.     Left eye: Left conjunctiva is not injected.     Pupils: Pupils are equal, round, and reactive to light.  Neck:     Thyroid: No thyromegaly.     Vascular: No JVD.     Trachea: No tracheal deviation.  Cardiovascular:      Rate and Rhythm: Normal rate and regular rhythm.     Heart sounds: Normal heart sounds. No murmur heard. No friction rub. No gallop.   Pulmonary:     Effort: Pulmonary effort is normal. No respiratory distress.     Breath sounds: Normal breath sounds. No wheezing or rales.  Abdominal:     General: Bowel sounds are normal.     Palpations: Abdomen is soft. There is no hepatomegaly, splenomegaly or mass.  Tenderness: There is abdominal tenderness in the suprapubic area. There is no guarding or rebound.  Musculoskeletal:        General: No tenderness. Normal range of motion.     Cervical back: Normal range of motion and neck supple.  Lymphadenopathy:     Cervical: No cervical adenopathy.  Skin:    General: Skin is warm.     Findings: No rash.  Neurological:     Mental Status: She is alert and oriented to person, place, and time.     Cranial Nerves: No cranial nerve deficit.     Deep Tendon Reflexes: Reflexes normal.  Psychiatric:        Mood and Affect: Mood is not anxious or depressed.     Wt Readings from Last 3 Encounters:  03/09/21 148 lb (67.1 kg)  03/09/21 148 lb 9.6 oz (67.4 kg)  02/06/21 151 lb (68.5 kg)    BP 130/82   Pulse 68   Ht  (1.549 m)   Wt 148 lb (67.1 kg)   BMI 27.96 kg/m   Assessment and Plan: 1. Frequent urination Patient with history of urinary frequency and some discomfort in the lower suprapubic area.  Dipstick of urine was done and no white cells were noted but frequency and urgency is present. - POCT urinalysis dipstick  2. Cystitis Acute.  Persistent.  Stable.  Patient does have some suprapubic discomfort and with frequency and urgency this may be associated with a urethritis which the urinalysis would have been unremarkable.  We will treat with Macrobid 1 twice a day for 3 days.

## 2021-03-09 NOTE — Patient Instructions (Signed)
Judy Bolton , Thank you for taking time to come for your Medicare Wellness Visit. I appreciate your ongoing commitment to your health goals. Please review the following plan we discussed and let me know if I can assist you in the future.   Screening recommendations/referrals: Colonoscopy: done 10/29/14. Repeat in 2025 Mammogram: done 02/15/19. Please call [phone] to schedule your mammogram and bone density screening.  Bone Density: done 02/15/19 Recommended yearly ophthalmology/optometry visit for glaucoma screening and checkup Recommended yearly dental visit for hygiene and checkup  Vaccinations: Influenza vaccine: done 08/15/20 Pneumococcal vaccine: done 09/08/17  Tdap vaccine: done 08/30/17 Shingles vaccine: done 08/15/19 due for second dose    Covid-19:done 01/18/20, 02/12/20 & 09/18/20  Advanced directives: Advance directive discussed with you today. I have provided a copy for you to complete at home and have notarized. Once this is complete please bring a copy in to our office so we can scan it into your chart.  Conditions/risks identified: Recommend increasing physical activity as tolerated.   Next appointment: Follow up in one year for your annual wellness visit    Preventive Care 73 Years and Older, Female Preventive care refers to lifestyle choices and visits with your health care provider that can promote health and wellness. What does preventive care include?  A yearly physical exam. This is also called an annual well check.  Dental exams once or twice a year.  Routine eye exams. Ask your health care provider how often you should have your eyes checked.  Personal lifestyle choices, including:  Daily care of your teeth and gums.  Regular physical activity.  Eating a healthy diet.  Avoiding tobacco and drug use.  Limiting alcohol use.  Practicing safe sex.  Taking low-dose aspirin every day.  Taking vitamin and mineral supplements as recommended by your health  care provider. What happens during an annual well check? The services and screenings done by your health care provider during your annual well check will depend on your age, overall health, lifestyle risk factors, and family history of disease. Counseling  Your health care provider may ask you questions about your:  Alcohol use.  Tobacco use.  Drug use.  Emotional well-being.  Home and relationship well-being.  Sexual activity.  Eating habits.  History of falls.  Memory and ability to understand (cognition).  Work and work Astronomer.  Reproductive health. Screening  You may have the following tests or measurements:  Height, weight, and BMI.  Blood pressure.  Lipid and cholesterol levels. These may be checked every 5 years, or more frequently if you are over 50 years old.  Skin check.  Lung cancer screening. You may have this screening every year starting at age 45 if you have a 30-pack-year history of smoking and currently smoke or have quit within the past 15 years.  Fecal occult blood test (FOBT) of the stool. You may have this test every year starting at age 56.  Flexible sigmoidoscopy or colonoscopy. You may have a sigmoidoscopy every 5 years or a colonoscopy every 10 years starting at age 21.  Hepatitis C blood test.  Hepatitis B blood test.  Sexually transmitted disease (STD) testing.  Diabetes screening. This is done by checking your blood sugar (glucose) after you have not eaten for a while (fasting). You may have this done every 1-3 years.  Bone density scan. This is done to screen for osteoporosis. You may have this done starting at age 44.  Mammogram. This may be done every 1-2 years. Talk  to your health care provider about how often you should have regular mammograms. Talk with your health care provider about your test results, treatment options, and if necessary, the need for more tests. Vaccines  Your health care provider may recommend certain  vaccines, such as:  Influenza vaccine. This is recommended every year.  Tetanus, diphtheria, and acellular pertussis (Tdap, Td) vaccine. You may need a Td booster every 10 years.  Zoster vaccine. You may need this after age 66.  Pneumococcal 13-valent conjugate (PCV13) vaccine. One dose is recommended after age 71.  Pneumococcal polysaccharide (PPSV23) vaccine. One dose is recommended after age 56. Talk to your health care provider about which screenings and vaccines you need and how often you need them. This information is not intended to replace advice given to you by your health care provider. Make sure you discuss any questions you have with your health care provider. Document Released: 12/19/2015 Document Revised: 08/11/2016 Document Reviewed: 09/23/2015 Elsevier Interactive Patient Education  2017 [place] Prevention in the Home Falls can cause injuries. They can happen to people of all ages. There are many things you can do to make your home safe and to help prevent falls. What can I do on the outside of my home?  Regularly fix the edges of walkways and driveways and fix any cracks.  Remove anything that might make you trip as you walk through a door, such as a raised step or threshold.  Trim any bushes or trees on the path to your home.  Use bright outdoor lighting.  Clear any walking paths of anything that might make someone trip, such as rocks or tools.  Regularly check to see if handrails are loose or broken. Make sure that both sides of any steps have handrails.  Any raised decks and porches should have guardrails on the edges.  Have any leaves, snow, or ice cleared regularly.  Use sand or salt on walking paths during winter.  Clean up any spills in your garage right away. This includes oil or grease spills. What can I do in the bathroom?  Use night lights.  Install grab bars by the toilet and in the tub and shower. Do not use towel bars as grab  bars.  Use non-skid mats or decals in the tub or shower.  If you need to sit down in the shower, use a plastic, non-slip stool.  Keep the floor dry. Clean up any water that spills on the floor as soon as it happens.  Remove soap buildup in the tub or shower regularly.  Attach bath mats securely with double-sided non-slip rug tape.  Do not have throw rugs and other things on the floor that can make you trip. What can I do in the bedroom?  Use night lights.  Make sure that you have a light by your bed that is easy to reach.  Do not use any sheets or blankets that are too big for your bed. They should not hang down onto the floor.  Have a firm chair that has side arms. You can use this for support while you get dressed.  Do not have throw rugs and other things on the floor that can make you trip. What can I do in the kitchen?  Clean up any spills right away.  Avoid walking on wet floors.  Keep items that you use a lot in easy-to-reach places.  If you need to reach something above you, use a strong step stool that  has a grab bar.  Keep electrical cords out of the way.  Do not use floor polish or wax that makes floors slippery. If you must use wax, use non-skid floor wax.  Do not have throw rugs and other things on the floor that can make you trip. What can I do with my stairs?  Do not leave any items on the stairs.  Make sure that there are handrails on both sides of the stairs and use them. Fix handrails that are broken or loose. Make sure that handrails are as long as the stairways.  Check any carpeting to make sure that it is firmly attached to the stairs. Fix any carpet that is loose or worn.  Avoid having throw rugs at the top or bottom of the stairs. If you do have throw rugs, attach them to the floor with carpet tape.  Make sure that you have a light switch at the top of the stairs and the bottom of the stairs. If you do not have them, ask someone to add them for  you. What else can I do to help prevent falls?  Wear shoes that:  Do not have high heels.  Have rubber bottoms.  Are comfortable and fit you well.  Are closed at the toe. Do not wear sandals.  If you use a stepladder:  Make sure that it is fully opened. Do not climb a closed stepladder.  Make sure that both sides of the stepladder are locked into place.  Ask someone to hold it for you, if possible.  Clearly mark and make sure that you can see:  Any grab bars or handrails.  First and last steps.  Where the edge of each step is.  Use tools that help you move around (mobility aids) if they are needed. These include:  Canes.  Walkers.  Scooters.  Crutches.  Turn on the lights when you go into a dark area. Replace any light bulbs as soon as they burn out.  Set up your furniture so you have a clear path. Avoid moving your furniture around.  If any of your floors are uneven, fix them.  If there are any pets around you, be aware of where they are.  Review your medicines with your doctor. Some medicines can make you feel dizzy. This can increase your chance of falling. Ask your doctor what other things that you can do to help prevent falls. This information is not intended to replace advice given to you by your health care provider. Make sure you discuss any questions you have with your health care provider. Document Released: 09/18/2009 Document Revised: 04/29/2016 Document Reviewed: 12/27/2014 Elsevier Interactive Patient Education  2017 [employer].

## 2021-03-09 NOTE — Progress Notes (Signed)
Subjective:   Judy Bolton is a 75 y.o. female who presents for Medicare Annual (Subsequent) preventive examination.  Review of Systems     Cardiac Risk Factors include: advanced age (>22men, >19 women);diabetes mellitus;dyslipidemia;hypertension     Objective:    Today's Vitals   03/09/21 1329 03/09/21 1330  BP: (!) 146/80   Pulse: 78   Resp: 16   Temp: 97.9 F (36.6 C)   TempSrc: Oral   SpO2: 93%   Weight: 148 lb 9.6 oz (67.4 kg)   Height: 5\' 1"  (1.549 m)   PainSc:  7    Body mass index is 28.08 kg/m.  Advanced Directives 03/09/2021 09/12/2020 02/06/2020 05/31/2019 01/31/2019 09/05/2018 01/23/2018  Does Patient Have a Medical Advance Directive? No Yes Yes Yes Yes No Yes  Type of Advance Directive - 01/25/2018;Living will Healthcare Power of Attorney Living will;Healthcare Power of Attorney - Living will  Does patient want to make changes to medical advance directive? - - - No - Patient declined Yes (MAU/Ambulatory/Procedural Areas - Information given) - Yes (MAU/Ambulatory/Procedural Areas - Information given)  Copy of Healthcare Power of Attorney in Chart? - - No - copy requested No - copy requested No - copy requested - -  Would patient like information on creating a medical advance directive? Yes (MAU/Ambulatory/Procedural Areas - Information given) - - No - Patient declined - No - Patient declined -    Current Medications (verified) Outpatient Encounter Medications as of 03/09/2021  Medication Sig  . albuterol (PROVENTIL HFA;VENTOLIN HFA) 108 (90 Base) MCG/ACT inhaler Inhale 2 puffs into the lungs every 6 (six) hours as needed for wheezing or shortness of breath.  05/09/2021 albuterol (PROVENTIL) (2.5 MG/3ML) 0.083% nebulizer solution Take 3 mLs (2.5 mg total) by nebulization every 6 (six) hours as needed for wheezing or shortness of breath.  Marland Kitchen amLODipine (NORVASC) 2.5 MG tablet Take 1 tablet (2.5 mg total) by mouth daily.  Marland Kitchen aspirin EC 81 MG tablet Take by mouth.   Marland Kitchen atorvastatin (LIPITOR) 40 MG tablet Take 1 tablet (40 mg total) by mouth daily.  . celecoxib (CELEBREX) 200 MG capsule Take 200 mg by mouth daily. mundy  . cholecalciferol (VITAMIN D3) 25 MCG (1000 UNIT) tablet Take 1,000 Units by mouth daily.  . cloNIDine (CATAPRES) 0.1 MG tablet Take 1 tablet (0.1 mg total) by mouth 2 (two) times daily as needed. (Patient taking differently: Take 0.1 mg by mouth 2 (two) times daily as needed. Takes 1/2 tablet as needed twice a day)  . docusate sodium (COLACE) 100 MG capsule Take 100 mg by mouth daily.  Marland Kitchen gemfibrozil (LOPID) 600 MG tablet Take 1 tablet (600 mg total) by mouth 2 (two) times daily.  Marland Kitchen glipiZIDE (GLUCOTROL) 5 MG tablet Take 5 mg by mouth every morning.  . hydrochlorothiazide (HYDRODIURIL) 25 MG tablet TAKE 1 TABLET BY MOUTH EVERY DAY  . Investigational - Study Medication Study name: Diabetes medication - oral tablet once daily Additional study details: Dr. Marland Kitchen with [facility]  . ipratropium (ATROVENT) 0.02 % nebulizer solution Inhale into the lungs.  . metFORMIN (GLUCOPHAGE) 500 MG tablet Take 500 mg by mouth 2 (two) times daily.  . metoprolol tartrate (LOPRESSOR) 50 MG tablet TAKE 1 TABLET(50 MG) BY MOUTH TWICE DAILY  . montelukast (SINGULAIR) 10 MG tablet Take 1 tablet (10 mg total) by mouth daily.  [facility] omeprazole (PRILOSEC) 20 MG capsule Take 20 mg by mouth daily. otc  . predniSONE (DELTASONE) 10 MG tablet Take  1 tablet by mouth daily. Meredeth Ide  . vitamin B-12 (CYANOCOBALAMIN) 1000 MCG tablet Take 1,000 mcg by mouth daily.  Marland Kitchen gabapentin (NEURONTIN) 100 MG capsule Take 2 capsules by mouth at bedtime. mundy   No facility-administered encounter medications on file as of 03/09/2021.    Allergies (verified) Betadine [povidone iodine], Codeine, Latex, Losartan, Meloxicam, Penicillins, and Valsartan   History: Past Medical History:  Diagnosis Date  . Acid reflux   . Arthritis   . Asthma   . Back pain   . Back pain   . COPD (chronic  obstructive pulmonary disease) (HCC)   . Diabetes mellitus without complication (HCC)   . Hyperlipidemia   . Hypertension   . Seizure (HCC)    x1 - over 30 yrs ago  . Sleep apnea    No CPAP  . Stroke [facility])    March of 2017 vocabulary   Past Surgical History:  Procedure Laterality Date  . ABDOMINAL HYSTERECTOMY    . ANTERIOR (CYSTOCELE) AND POSTERIOR REPAIR (RECTOCELE) WITH XENFORM GRAFT AND SACROSPINOUS FIXATION    . APPENDECTOMY    . BROW LIFT Bilateral 09/05/2018   Procedure: BLEPHAROPLASTY UPPER EYELID W/EXCESS SKIN;  Surgeon: Imagene Riches, MD;  Location: [facility];  Service: Ophthalmology;  Laterality: Bilateral;  . CTR Bilateral   . LUMBAR LAMINECTOMY/DECOMPRESSION MICRODISCECTOMY N/A 07/27/2017   Procedure: LUMBAR LAMINECTOMY/DECOMPRESSION MICRODISCECTOMY 3 LEVELS-L3-S1;  Surgeon: Venetia Night, MD;  Location: [facility];  Service: Neurosurgery;  Laterality: N/A;  . PTOSIS REPAIR Bilateral 09/05/2018   Procedure: PTOSIS REPAIR RESECT EX;  Surgeon: Imagene Riches, MD;  Location: [facility];  Service: Ophthalmology;  Laterality: Bilateral;  Diabetic - oral meds sleep apnea Latexd allergy  . ROTATOR CUFF REPAIR Right    Family History  Problem Relation Age of Onset  . Heart attack Mother   . Prostate cancer Father   . Brain cancer Father   . Heart attack Maternal Grandmother   . Heart attack Maternal Grandfather   . Breast cancer Paternal Grandmother    Social History   Socioeconomic History  . Marital status: Widowed    Spouse name: Not on file  . Number of children: 1  . Years of education: some college  . Highest education level: 12th grade  Occupational History  . Occupation: Retired  Tobacco Use  . Smoking status: Former Smoker    Packs/day: 1.50    Years: 28.00    Pack years: 42.00    Types: Cigarettes    Quit date: 08/14/2000    Years since quitting: 20.5  . Smokeless tobacco: Never Used  . Tobacco comment: smoking cessatiuon materials  not required  Vaping Use  . Vaping Use: Never used  Substance and Sexual Activity  . Alcohol use: No  . Drug use: No  . Sexual activity: Not Currently  Other Topics Concern  . Not on file  Social History Narrative   Pt lives alone   Social Determinants of Health   Financial Resource Strain: Low Risk   . Difficulty of Paying Living Expenses: Not hard at all  Food Insecurity: No Food Insecurity  . Worried About Programme researcher, broadcasting/film/video in the Last Year: Never true  . Ran Out of Food in the Last Year: Never true  Transportation Needs: No Transportation Needs  . Lack of Transportation (Medical): No  . Lack of Transportation (Non-Medical): No  Physical Activity: Inactive  . Days of Exercise per Week: 0 days  . Minutes of Exercise per  Session: 0 min  Stress: No Stress Concern Present  . Feeling of Stress : Not at all  Social Connections: Moderately Integrated  . Frequency of Communication with Friends and Family: More than three times a week  . Frequency of Social Gatherings with Friends and Family: More than three times a week  . Attends Religious Services: More than 4 times per year  . Active Member of Clubs or Organizations: Yes  . Attends Banker Meetings: More than 4 times per year  . Marital Status: Widowed    Tobacco Counseling Counseling given: Not Answered Comment: smoking cessatiuon materials not required   Clinical Intake:  Pre-visit preparation completed: Yes  Pain : 0-10 Pain Score: 7  Pain Type: Acute pain Pain Location: Abdomen Pain Orientation: Lower,Anterior,Posterior Pain Descriptors / Indicators: Sore,Discomfort,Cramping Pain Onset: In the past 7 days Pain Frequency: Constant     BMI - recorded: 28.08 Nutritional Status: BMI 25 -29 Overweight Nutritional Risks: None Diabetes: Yes CBG done?: No Did pt. bring in CBG monitor from home?: No  How often do you need to have someone help you when you read instructions, pamphlets, or other  written materials from your doctor or pharmacy?: 1 - Never  Nutrition Risk Assessment:  Has the patient had any N/V/D within the last 2 months?  No  Does the patient have any non-healing wounds?  No  Has the patient had any unintentional weight loss or weight gain?  No   Diabetes:  Is the patient diabetic?  Yes  If diabetic, was a CBG obtained today?  No  Did the patient bring in their glucometer from home?  No  How often do you monitor your CBG's? 1-2 times daily.   Financial Strains and Diabetes Management:  Are you having any financial strains with the device, your supplies or your medication? No .  Does the patient want to be seen by Chronic Care Management for management of their diabetes?  No  Would the patient like to be referred to a Nutritionist or for Diabetic Management?  No   Diabetic Exams:  Diabetic Eye Exam: Completed 12/15/20 positive retinopathy [facility].   Diabetic Foot Exam: Completed 2019. Pt has been advised about the importance in completing this exam.   Interpreter Needed?: No  Information entered by :: Reather Littler LPN   Activities of Daily Living In your present state of health, do you have any difficulty performing the following activities: 03/09/2021 02/06/2021  Hearing? N N  Comment declines hearing aids -  Vision? N N  Difficulty concentrating or making decisions? N N  Walking or climbing stairs? N N  Dressing or bathing? N N  Doing errands, shopping? N N  Preparing Food and eating ? N -  Using the Toilet? N -  In the past six months, have you accidently leaked urine? N -  Do you have problems with loss of bowel control? N -  Managing your Medications? N -  Managing your Finances? N -  Housekeeping or managing your Housekeeping? N -  Some recent data might be hidden    Patient Care Team: Duanne Limerick, MD as PCP - General (Family Medicine) Mertie Moores, MD as Consulting Physician (Specialist)  Indicate any recent Medical  Services you may have received from other than [facility] providers in the past year (date may be approximate).     Assessment:   This is a routine wellness examination for Bruno.  Hearing/Vision screen  Hearing Screening  125Hz  250Hz  500Hz  1000Hz  2000Hz  3000Hz  4000Hz  6000Hz  8000Hz   Right ear:           Left ear:           Comments: Pt denies hearing difficulty  Vision Screening Comments: Annual exams done at [facility]  Dietary issues and exercise activities discussed: Current Exercise Habits: The patient has a physically strenuous job, but has no regular exercise apart from work., Exercise limited by: respiratory conditions(s)  Goals    . DIET - INCREASE WATER INTAKE     Recommend to drink at least 6-8 8oz glasses of water per day.    . Patient Stated     Pt states she would like to maintain her health and lower A1C.    . 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       Depression Screen PHQ 2/9 Scores 03/09/2021 01/29/2021 07/31/2020 05/02/2020 04/24/2020 02/06/2020 01/31/2020  PHQ - 2 Score 0 0 0 0 0 0 0  PHQ- 9 Score - 0 0 0 0 - 1    Fall Risk Fall Risk  03/09/2021 07/31/2020 04/24/2020 02/06/2020 01/31/2019  Falls in the past year? 0 0 0 1 1  Number falls in past yr: 0 - - 1 1  Comment - - - 02/2019 tripped over nebulizer cord  Injury with Fall? 0 - - 0 0  Risk for fall due to : No Fall Risks - - History of fall(s) -  Risk for fall due to: Comment - - - - -  Follow up Falls prevention discussed Falls evaluation completed Falls evaluation completed Falls prevention discussed Falls prevention discussed    FALL RISK PREVENTION PERTAINING TO THE HOME:  Any stairs in or around the home? Yes  If so, are there any without handrails? No  Home free of loose throw rugs in walkways, pet beds, electrical cords, etc? Yes  Adequate lighting in your home to reduce risk of falls? Yes   ASSISTIVE DEVICES UTILIZED TO PREVENT  FALLS:  Life alert? No  Use of a cane, walker or w/c? No  Grab bars in the bathroom? Yes  Shower chair or bench in shower? No  Elevated toilet seat or a handicapped toilet? Yes   TIMED UP AND GO:  Was the test performed? Yes .  Length of time to ambulate 10 feet: 5 sec.   Gait steady and fast without use of assistive device  Cognitive Function: Normal cognitive status assessed by direct observation by this Nurse Health Advisor. No abnormalities found.       6CIT Screen 01/31/2019 01/23/2018  What Year? 0 points 0 points  What month? 0 points 0 points  What time? 0 points 0 points  Count back from 20 0 points 0 points  Months in reverse 0 points 0 points  Repeat phrase 2 points 4 points  Total Score 2 4    Immunizations Immunization History  Administered Date(s) Administered  . Fluad Quad(high Dose 65+) 08/15/2019, 08/15/2020  . Influenza, High Dose Seasonal PF 08/30/2017  . Influenza,inj,Quad PF,6+ Mos 09/01/2015, 09/09/2016, 08/30/2018  . PFIZER(Purple Top)SARS-COV-2 Vaccination 01/18/2020, 02/12/2020, 09/18/2020  . Pneumococcal Conjugate-13 09/01/2015  . Pneumococcal Polysaccharide-23 09/08/2017  . Tdap 08/30/2017  . Zoster Recombinat (Shingrix) 08/15/2019    TDAP status: Up to date  Flu Vaccine status: Up to date  Pneumococcal vaccine status: Up to date  Covid-19 vaccine status: Completed vaccines  Qualifies  for Shingles Vaccine? Yes   Zostavax completed No   Shingrix Completed?: Yes - due for second dose  Screening Tests Health Maintenance  Topic Date Due  . MAMMOGRAM  02/15/2020  . INFLUENZA VACCINE  07/06/2021  . OPHTHALMOLOGY EXAM  12/15/2021  . COLONOSCOPY (Pts 45-78yrs Insurance coverage will need to be confirmed)  10/29/2024  . TETANUS/TDAP  08/31/2027  . DEXA SCAN  Completed  . COVID-19 Vaccine  Completed  . PNA vac Low Risk Adult  Completed  . HPV VACCINES  Aged Out  . FOOT EXAM  Discontinued  . HEMOGLOBIN A1C  Discontinued  . Hepatitis C  Screening  Discontinued    Health Maintenance  Health Maintenance Due  Topic Date Due  . MAMMOGRAM  02/15/2020    Colorectal cancer screening: Type of screening: Colonoscopy. Completed 10/29/14. Repeat every 10 years  Mammogram status: Completed 02/15/19. Repeat every year. Ordered today  Bone Density status: Completed 02/15/19. Results reflect: Bone density results: OSTEOPENIA. Repeat every 2 years.  Lung Cancer Screening: (Low Dose CT Chest recommended if Age 73-80 years, 30 pack-year currently smoking OR have quit w/in 15years.) does not qualify.    Additional Screening:  Hepatitis C Screening: does qualify; Completed 01/24/18  Vision Screening: Recommended annual ophthalmology exams for early detection of glaucoma and other disorders of the eye. Is the patient up to date with their annual eye exam?  Yes  Who is the provider or what is the name of the office in which the patient attends annual eye exams? [facility]  Dental Screening: Recommended annual dental exams for proper oral hygiene  Community Resource Referral / Chronic Care Management: CRR required this visit?  No   CCM required this visit?  No      Plan:     I have personally reviewed and noted the following in the patient's chart:   . Medical and social history . Use of alcohol, tobacco or illicit drugs  . Current medications and supplements . Functional ability and status . Nutritional status . Physical activity . Advanced directives . List of other physicians . Hospitalizations, surgeries, and ER visits in previous 12 months . Vitals . Screenings to include cognitive, depression, and falls . Referrals and appointments  In addition, I have reviewed and discussed with patient certain preventive protocols, quality metrics, and best practice recommendations. A written personalized care plan for preventive services as well as general preventive health recommendations were provided to patient.      Reather Littler, LPN   02/03/4969   Nurse Notes: pt c/o UTI sxs since Saturday 03/07/21. Scheduled to see Dr. Yetta Barre today in office.

## 2021-03-17 ENCOUNTER — Ambulatory Visit: Payer: Medicare HMO

## 2021-03-17 ENCOUNTER — Inpatient Hospital Stay: Admission: RE | Admit: 2021-03-17 | Payer: Medicare HMO | Source: Ambulatory Visit

## 2021-03-26 ENCOUNTER — Other Ambulatory Visit: Payer: Self-pay | Admitting: Family Medicine

## 2021-03-26 ENCOUNTER — Telehealth: Payer: Self-pay

## 2021-03-26 DIAGNOSIS — Z1231 Encounter for screening mammogram for malignant neoplasm of breast: Secondary | ICD-10-CM

## 2021-03-26 DIAGNOSIS — Z780 Asymptomatic menopausal state: Secondary | ICD-10-CM

## 2021-03-26 NOTE — Telephone Encounter (Signed)
Pt no showed her bone density and mammo appt- I called and gave her the number to call and reschedule

## 2021-04-09 DIAGNOSIS — J449 Chronic obstructive pulmonary disease, unspecified: Secondary | ICD-10-CM | POA: Diagnosis not present

## 2021-04-13 ENCOUNTER — Ambulatory Visit
Admission: RE | Admit: 2021-04-13 | Discharge: 2021-04-13 | Disposition: A | Discharge status: Home / Self Care | Payer: Medicare Other | Source: Ambulatory Visit | Attending: Family Medicine | Admitting: Family Medicine

## 2021-04-13 ENCOUNTER — Other Ambulatory Visit: Payer: Self-pay

## 2021-04-13 DIAGNOSIS — M85851 Other specified disorders of bone density and structure, right thigh: Secondary | ICD-10-CM | POA: Diagnosis not present

## 2021-04-13 DIAGNOSIS — Z780 Asymptomatic menopausal state: Secondary | ICD-10-CM

## 2021-04-13 DIAGNOSIS — Z1231 Encounter for screening mammogram for malignant neoplasm of breast: Secondary | ICD-10-CM | POA: Diagnosis not present

## 2021-04-28 ENCOUNTER — Other Ambulatory Visit: Payer: Self-pay

## 2021-04-28 ENCOUNTER — Ambulatory Visit (INDEPENDENT_AMBULATORY_CARE_PROVIDER_SITE_OTHER): Payer: Medicare Other | Admitting: Family Medicine

## 2021-04-28 ENCOUNTER — Encounter: Payer: Self-pay | Admitting: Family Medicine

## 2021-04-28 VITALS — BP 110/80 | HR 80 | Ht 61.0 in | Wt 150.0 lb

## 2021-04-28 DIAGNOSIS — E785 Hyperlipidemia, unspecified: Secondary | ICD-10-CM

## 2021-04-28 DIAGNOSIS — J301 Allergic rhinitis due to pollen: Secondary | ICD-10-CM | POA: Diagnosis not present

## 2021-04-28 DIAGNOSIS — I10 Essential (primary) hypertension: Secondary | ICD-10-CM | POA: Diagnosis not present

## 2021-04-28 MED ORDER — METOPROLOL TARTRATE 50 MG PO TABS: ORAL_TABLET | ORAL | 1 refills | Status: DC

## 2021-04-28 MED ORDER — AMLODIPINE BESYLATE 2.5 MG PO TABS: 2.5000 mg | ORAL_TABLET | Freq: Every day | ORAL | 1 refills | Status: DC

## 2021-04-28 MED ORDER — HYDROCHLOROTHIAZIDE 25 MG PO TABS: 1.0000 | ORAL_TABLET | Freq: Every day | ORAL | 1 refills | Status: DC

## 2021-04-28 MED ORDER — GEMFIBROZIL 600 MG PO TABS
600.0000 mg | ORAL_TABLET | Freq: Two times a day (BID) | ORAL | 1 refills | Status: DC
Start: 2021-04-28 — End: 2021-08-11

## 2021-04-28 MED ORDER — MONTELUKAST SODIUM 10 MG PO TABS: 10.0000 mg | ORAL_TABLET | Freq: Every day | ORAL | 1 refills | Status: DC

## 2021-04-28 MED ORDER — ATORVASTATIN CALCIUM 40 MG PO TABS: 40.0000 mg | ORAL_TABLET | Freq: Every day | ORAL | 1 refills | Status: DC

## 2021-04-28 NOTE — Progress Notes (Signed)
Date:  04/28/2021   Name:  Judy Bolton   DOB:  February 04, 1946   MRN:  [id number]   Chief Complaint: Hypertension, Hyperlipidemia, and COPD  Hypertension This is a chronic problem. The current episode started more than 1 year ago. The problem has been gradually improving since onset. The problem is controlled. Pertinent negatives include no anxiety, blurred vision, chest pain, headaches, malaise/fatigue, neck pain, orthopnea, palpitations, peripheral edema, PND, shortness of breath or sweats. There are no associated agents to hypertension. There are no known risk factors for coronary artery disease. Past treatments include beta blockers, calcium channel blockers and diuretics. The current treatment provides moderate improvement. There are no compliance problems.  There is no history of angina, kidney disease, CAD/MI, CVA, heart failure, left ventricular hypertrophy, PVD or retinopathy. There is no history of chronic renal disease, a hypertension causing med or renovascular disease.  Hyperlipidemia This is a chronic problem. The current episode started more than 1 year ago. Recent lipid tests were reviewed and are normal. She has no history of chronic renal disease, diabetes, hypothyroidism, liver disease, obesity or nephrotic syndrome. Factors aggravating her hyperlipidemia include thiazides. Pertinent negatives include no chest pain, myalgias or shortness of breath. Current antihyperlipidemic treatment includes statins and ezetimibe. The current treatment provides moderate improvement of lipids. There are no compliance problems.   COPD There is no cough, shortness of breath or wheezing. Pertinent negatives include no chest pain, ear pain, fever, headaches, malaise/fatigue, myalgias, PND, rhinorrhea, sneezing, sore throat or sweats. Her symptoms are aggravated by change in weather and pollen. Her symptoms are alleviated by leukotriene antagonist. Her past medical history is significant for bronchitis  and COPD.    Lab Results  Component Value Date   CREATININE 1.32 (H) 09/12/2020   BUN 37 (H) 09/12/2020   NA 135 09/12/2020   K 3.9 09/12/2020   CL 99 09/12/2020   CO2 26 09/12/2020   Lab Results  Component Value Date   CHOL 185 01/24/2018   HDL 66 01/24/2018   LDLCALC 88 01/24/2018   TRIG 154 (H) 01/24/2018   CHOLHDL 2.8 01/24/2018   No results found for: TSH Lab Results  Component Value Date   HGBA1C 7.2 12/13/2020   Lab Results  Component Value Date   WBC 11.8 (H) 09/12/2020   HGB 13.4 09/12/2020   HCT 38.9 09/12/2020   MCV 85.7 09/12/2020   PLT 252 09/12/2020   Lab Results  Component Value Date   ALT 18 09/12/2020   AST 18 09/12/2020   ALKPHOS 61 09/12/2020   BILITOT 0.6 09/12/2020     Review of Systems  Constitutional: Negative.  Negative for chills, fatigue, fever, malaise/fatigue and unexpected weight change.  HENT: Negative for congestion, ear discharge, ear pain, rhinorrhea, sinus pressure, sneezing and sore throat.   Eyes: Negative for blurred vision, photophobia, pain, discharge, redness and itching.  Respiratory: Negative for cough, shortness of breath, wheezing and stridor.   Cardiovascular: Negative for chest pain, palpitations, orthopnea and PND.  Gastrointestinal: Negative for abdominal pain, blood in stool, constipation, diarrhea, nausea and vomiting.  Endocrine: Negative for cold intolerance, heat intolerance, polydipsia, polyphagia and polyuria.  Genitourinary: Negative for dysuria, flank pain, frequency, hematuria, menstrual problem, pelvic pain, urgency, vaginal bleeding and vaginal discharge.  Musculoskeletal: Negative for arthralgias, back pain, myalgias and neck pain.  Skin: Negative for rash.  Allergic/Immunologic: Negative for environmental allergies and food allergies.  Neurological: Negative for dizziness, weakness, light-headedness, numbness and headaches.  Hematological: Negative for  adenopathy. Does not bruise/bleed easily.   Psychiatric/Behavioral: Negative for dysphoric mood. The patient is not nervous/anxious.     Patient Active Problem List   Diagnosis Date Noted  . Essential hypertension 08/30/2017  . Gastroesophageal reflux disease 08/30/2017  . Aortic atherosclerosis (HCC) 05/31/2017  . PAD (peripheral artery disease) (HCC) 05/31/2017  . Encounter for long-term (current) use of high-risk medication 12/28/2016  . Seronegative rheumatoid arthritis (HCC) 12/28/2016  . Bilateral hand pain 11/08/2016  . Elevated C-reactive protein 11/08/2016  . Swelling of joint of right hand 11/08/2016  . Type 2 diabetes mellitus with microalbuminuria, without long-term current use of insulin (HCC) 07/06/2016  . Acute anxiety 04/16/2016  . Bronchitis 04/16/2016  . Centrilobular emphysema (HCC) 04/16/2016  . TIA (transient ischemic attack) 02/29/2016  . Lumbar stenosis with neurogenic claudication 04/16/2015  . Spondylosis of lumbar region without myelopathy or radiculopathy 04/16/2015  . Hyperlipidemia 10/15/2014    Allergies  Allergen Reactions  . Betadine [Povidone Iodine] Swelling  . Codeine Itching  . Latex Itching and Swelling    (urinary catheter)  . Losartan     intolerance  . Meloxicam Swelling  . Penicillins Other (See Comments)    Paralysis  Has patient had a PCN reaction causing immediate rash, facial/tongue/throat swelling, SOB or lightheadedness with hypotension: No Has patient had a PCN reaction causing severe rash involving mucus membranes or skin necrosis: No Has patient had a PCN reaction that required hospitalization Yes Has patient had a PCN reaction occurring within the last 10 years: No If all of the above answers are "NO", then may proceed with Cephalosporin use.  . Valsartan     Past Surgical History:  Procedure Laterality Date  . ABDOMINAL HYSTERECTOMY    . ANTERIOR (CYSTOCELE) AND POSTERIOR REPAIR (RECTOCELE) WITH XENFORM GRAFT AND SACROSPINOUS FIXATION    . APPENDECTOMY    .  BROW LIFT Bilateral 09/05/2018   Procedure: BLEPHAROPLASTY UPPER EYELID W/EXCESS SKIN;  Surgeon: Imagene Riches, MD;  Location: [facility];  Service: Ophthalmology;  Laterality: Bilateral;  . CTR Bilateral   . LUMBAR LAMINECTOMY/DECOMPRESSION MICRODISCECTOMY N/A 07/27/2017   Procedure: LUMBAR LAMINECTOMY/DECOMPRESSION MICRODISCECTOMY 3 LEVELS-L3-S1;  Surgeon: Venetia Night, MD;  Location: [facility];  Service: Neurosurgery;  Laterality: N/A;  . PTOSIS REPAIR Bilateral 09/05/2018   Procedure: PTOSIS REPAIR RESECT EX;  Surgeon: Imagene Riches, MD;  Location: [facility];  Service: Ophthalmology;  Laterality: Bilateral;  Diabetic - oral meds sleep apnea Latexd allergy  . ROTATOR CUFF REPAIR Right     Social History   Tobacco Use  . Smoking status: Former Smoker    Packs/day: 1.50    Years: 28.00    Pack years: 42.00    Types: Cigarettes    Quit date: 08/14/2000    Years since quitting: 20.7  . Smokeless tobacco: Never Used  . Tobacco comment: smoking cessatiuon materials not required  Vaping Use  . Vaping Use: Never used  Substance Use Topics  . Alcohol use: No  . Drug use: No     Medication list has been reviewed and updated.  Current Meds  Medication Sig  . albuterol (PROVENTIL HFA;VENTOLIN HFA) 108 (90 Base) MCG/ACT inhaler Inhale 2 puffs into the lungs every 6 (six) hours as needed for wheezing or shortness of breath.  Marland Kitchen albuterol (PROVENTIL) (2.5 MG/3ML) 0.083% nebulizer solution Take 3 mLs (2.5 mg total) by nebulization every 6 (six) hours as needed for wheezing or shortness of breath.  Marland Kitchen amLODipine (NORVASC) 2.5 MG tablet Take  1 tablet (2.5 mg total) by mouth daily.  Marland Kitchen aspirin EC 81 MG tablet Take by mouth.  Marland Kitchen atorvastatin (LIPITOR) 40 MG tablet Take 1 tablet (40 mg total) by mouth daily.  . celecoxib (CELEBREX) 200 MG capsule Take 200 mg by mouth daily. mundy  . cholecalciferol (VITAMIN D3) 25 MCG (1000 UNIT) tablet Take 1,000 Units by mouth daily.  .  cloNIDine (CATAPRES) 0.1 MG tablet Take 1 tablet (0.1 mg total) by mouth 2 (two) times daily as needed. (Patient taking differently: Take 0.1 mg by mouth 2 (two) times daily as needed. Takes 1/2 tablet as needed twice a day)  . docusate sodium (COLACE) 100 MG capsule Take 100 mg by mouth daily.  Marland Kitchen gabapentin (NEURONTIN) 100 MG capsule Take 2 capsules by mouth at bedtime. mundy  . gemfibrozil (LOPID) 600 MG tablet Take 1 tablet (600 mg total) by mouth 2 (two) times daily.  Marland Kitchen glipiZIDE (GLUCOTROL) 5 MG tablet Take 5 mg by mouth every morning.  . hydrochlorothiazide (HYDRODIURIL) 25 MG tablet TAKE 1 TABLET BY MOUTH EVERY DAY  . Investigational - Study Medication Study name: Diabetes medication - oral tablet once daily Additional study details: Dr. Dareen Piano with [facility]  . ipratropium (ATROVENT) 0.02 % nebulizer solution Inhale into the lungs.  . metFORMIN (GLUCOPHAGE) 500 MG tablet Take 500 mg by mouth 2 (two) times daily.  . metoprolol tartrate (LOPRESSOR) 50 MG tablet TAKE 1 TABLET(50 MG) BY MOUTH TWICE DAILY  . montelukast (SINGULAIR) 10 MG tablet Take 1 tablet (10 mg total) by mouth daily.  Marland Kitchen omeprazole (PRILOSEC) 20 MG capsule Take 20 mg by mouth daily. otc  . predniSONE (DELTASONE) 10 MG tablet Take 1 tablet by mouth daily. Meredeth Ide  . vitamin B-12 (CYANOCOBALAMIN) 1000 MCG tablet Take 1,000 mcg by mouth daily.  . [DISCONTINUED] nitrofurantoin, macrocrystal-monohydrate, (MACROBID) 100 MG capsule Take 1 capsule (100 mg total) by mouth 2 (two) times daily.    PHQ 2/9 Scores 03/09/2021 01/29/2021 07/31/2020 05/02/2020  PHQ - 2 Score 0 0 0 0  PHQ- 9 Score - 0 0 0    GAD 7 : Generalized Anxiety Score 01/29/2021 07/31/2020 05/02/2020 04/24/2020  Nervous, Anxious, on Edge 0 0 0 0  Control/stop worrying 0 0 0 0  Worry too much - different things 0 0 0 0  Trouble relaxing 0 0 0 0  Restless 0 0 0 0  Easily annoyed or irritable 0 0 0 0  Afraid - awful might happen 0 0 0 0  Total GAD 7 Score 0 0 0 0   Anxiety Difficulty - - - -    BP Readings from Last 3 Encounters:  04/28/21 110/80  03/09/21 130/82  03/09/21 (!) 146/80    Physical Exam Vitals and nursing note reviewed.  Constitutional:      Appearance: She is well-developed.  HENT:     Head: Normocephalic.     Right Ear: Tympanic membrane, ear canal and external ear normal.     Left Ear: Tympanic membrane, ear canal and external ear normal.     Nose: Nose normal. No congestion or rhinorrhea.     Mouth/Throat:     Mouth: Mucous membranes are moist.  Eyes:     General: Lids are everted, no foreign bodies appreciated. No scleral icterus.       Left eye: No foreign body or hordeolum.     Conjunctiva/sclera: Conjunctivae normal.     Right eye: Right conjunctiva is not injected.     Left  eye: Left conjunctiva is not injected.     Pupils: Pupils are equal, round, and reactive to light.  Neck:     Thyroid: No thyromegaly.     Vascular: No JVD.     Trachea: No tracheal deviation.  Cardiovascular:     Rate and Rhythm: Normal rate and regular rhythm.     Heart sounds: Normal heart sounds. No murmur heard. No friction rub. No gallop.   Pulmonary:     Effort: Pulmonary effort is normal. No respiratory distress.     Breath sounds: Normal breath sounds. No wheezing, rhonchi or rales.  Abdominal:     General: Bowel sounds are normal.     Palpations: Abdomen is soft. There is no mass.     Tenderness: There is no abdominal tenderness. There is no guarding or rebound.  Musculoskeletal:        General: No tenderness. Normal range of motion.     Cervical back: Normal range of motion and neck supple.  Lymphadenopathy:     Cervical: No cervical adenopathy.  Skin:    General: Skin is warm.     Findings: No rash.  Neurological:     Mental Status: She is alert and oriented to person, place, and time.     Cranial Nerves: No cranial nerve deficit.     Deep Tendon Reflexes: Reflexes normal.  Psychiatric:        Mood and Affect: Mood  is not anxious or depressed.     Wt Readings from Last 3 Encounters:  04/28/21 150 lb (68 kg)  03/09/21 148 lb (67.1 kg)  03/09/21 148 lb 9.6 oz (67.4 kg)    BP 110/80   Pulse 80   Ht 5\' 1"  (1.549 m)   Wt 150 lb (68 kg)   BMI 28.34 kg/m   Assessment and Plan:  1. Essential hypertension Chronic.  Controlled.  Stable.  Blood pressure is 110/80.  Continue on amlodipine 2.5, hydrochlorothiazide 25 mg, and metoprolol 50 mg 1 twice a day. - amLODipine (NORVASC) 2.5 MG tablet; Take 1 tablet (2.5 mg total) by mouth daily.  Dispense: 90 tablet; Refill: 1 - hydrochlorothiazide (HYDRODIURIL) 25 MG tablet; Take 1 tablet (25 mg total) by mouth daily.  Dispense: 90 tablet; Refill: 1 - metoprolol tartrate (LOPRESSOR) 50 MG tablet; 1 tablet twice a day  Dispense: 180 tablet; Refill: 1  2. Hyperlipidemia, unspecified hyperlipidemia type Chronic.  Controlled.  Stable.  Continue atorvastatin 40 mg once a day and gemfibrozil 600 mg 1 twice a day. - atorvastatin (LIPITOR) 40 MG tablet; Take 1 tablet (40 mg total) by mouth daily.  Dispense: 90 tablet; Refill: 1 - gemfibrozil (LOPID) 600 MG tablet; Take 1 tablet (600 mg total) by mouth 2 (two) times daily.  Dispense: 180 tablet; Refill: 1  3. Essential hypertension As noted above - amLODipine (NORVASC) 2.5 MG tablet; Take 1 tablet (2.5 mg total) by mouth daily.  Dispense: 90 tablet; Refill: 1 - hydrochlorothiazide (HYDRODIURIL) 25 MG tablet; Take 1 tablet (25 mg total) by mouth daily.  Dispense: 90 tablet; Refill: 1 - metoprolol tartrate (LOPRESSOR) 50 MG tablet; 1 tablet twice a day  Dispense: 180 tablet; Refill: 1  4. Seasonal allergic rhinitis due to pollen Chronic.  Controlled.  Stable.  Continue Singulair 10 mg once a day. - montelukast (SINGULAIR) 10 MG tablet; Take 1 tablet (10 mg total) by mouth daily.  Dispense: 90 tablet; Refill: 1

## 2021-05-10 DIAGNOSIS — J449 Chronic obstructive pulmonary disease, unspecified: Secondary | ICD-10-CM | POA: Diagnosis not present

## 2021-05-12 DIAGNOSIS — R0600 Dyspnea, unspecified: Secondary | ICD-10-CM | POA: Diagnosis not present

## 2021-05-12 DIAGNOSIS — R059 Cough, unspecified: Secondary | ICD-10-CM | POA: Diagnosis not present

## 2021-05-12 DIAGNOSIS — J45901 Unspecified asthma with (acute) exacerbation: Secondary | ICD-10-CM | POA: Diagnosis not present

## 2021-05-12 DIAGNOSIS — J441 Chronic obstructive pulmonary disease with (acute) exacerbation: Secondary | ICD-10-CM | POA: Diagnosis not present

## 2021-05-22 ENCOUNTER — Other Ambulatory Visit: Payer: Self-pay | Admitting: Family Medicine

## 2021-05-22 DIAGNOSIS — J301 Allergic rhinitis due to pollen: Secondary | ICD-10-CM

## 2021-06-02 DIAGNOSIS — M48062 Spinal stenosis, lumbar region with neurogenic claudication: Secondary | ICD-10-CM | POA: Diagnosis not present

## 2021-06-02 DIAGNOSIS — M5416 Radiculopathy, lumbar region: Secondary | ICD-10-CM | POA: Diagnosis not present

## 2021-06-02 DIAGNOSIS — M5126 Other intervertebral disc displacement, lumbar region: Secondary | ICD-10-CM | POA: Diagnosis not present

## 2021-06-04 DIAGNOSIS — R809 Proteinuria, unspecified: Secondary | ICD-10-CM | POA: Diagnosis not present

## 2021-06-04 DIAGNOSIS — I10 Essential (primary) hypertension: Secondary | ICD-10-CM | POA: Diagnosis not present

## 2021-06-04 DIAGNOSIS — E785 Hyperlipidemia, unspecified: Secondary | ICD-10-CM | POA: Diagnosis not present

## 2021-06-04 DIAGNOSIS — E1129 Type 2 diabetes mellitus with other diabetic kidney complication: Secondary | ICD-10-CM | POA: Diagnosis not present

## 2021-06-09 DIAGNOSIS — J449 Chronic obstructive pulmonary disease, unspecified: Secondary | ICD-10-CM | POA: Diagnosis not present

## 2021-06-10 DIAGNOSIS — R059 Cough, unspecified: Secondary | ICD-10-CM | POA: Diagnosis not present

## 2021-06-15 ENCOUNTER — Other Ambulatory Visit: Payer: Self-pay | Admitting: Orthopedic Surgery

## 2021-06-15 ENCOUNTER — Other Ambulatory Visit (HOSPITAL_COMMUNITY): Payer: Self-pay | Admitting: Orthopedic Surgery

## 2021-06-15 DIAGNOSIS — M25312 Other instability, left shoulder: Secondary | ICD-10-CM | POA: Diagnosis not present

## 2021-06-15 DIAGNOSIS — R809 Proteinuria, unspecified: Secondary | ICD-10-CM | POA: Diagnosis not present

## 2021-06-15 DIAGNOSIS — M79602 Pain in left arm: Secondary | ICD-10-CM

## 2021-06-15 DIAGNOSIS — E1129 Type 2 diabetes mellitus with other diabetic kidney complication: Secondary | ICD-10-CM | POA: Diagnosis not present

## 2021-06-15 DIAGNOSIS — S46002A Unspecified injury of muscle(s) and tendon(s) of the rotator cuff of left shoulder, initial encounter: Secondary | ICD-10-CM

## 2021-06-15 DIAGNOSIS — M48062 Spinal stenosis, lumbar region with neurogenic claudication: Secondary | ICD-10-CM | POA: Diagnosis not present

## 2021-06-23 ENCOUNTER — Ambulatory Visit
Admission: RE | Admit: 2021-06-23 | Discharge: 2021-06-23 | Disposition: A | Discharge status: Home / Self Care | Payer: Medicare Other | Source: Ambulatory Visit | Attending: Orthopedic Surgery | Admitting: Orthopedic Surgery

## 2021-06-23 ENCOUNTER — Other Ambulatory Visit: Payer: Self-pay

## 2021-06-23 DIAGNOSIS — M79602 Pain in left arm: Secondary | ICD-10-CM | POA: Diagnosis not present

## 2021-06-23 DIAGNOSIS — S43432A Superior glenoid labrum lesion of left shoulder, initial encounter: Secondary | ICD-10-CM | POA: Diagnosis not present

## 2021-06-23 DIAGNOSIS — M25312 Other instability, left shoulder: Secondary | ICD-10-CM

## 2021-06-23 DIAGNOSIS — S46002A Unspecified injury of muscle(s) and tendon(s) of the rotator cuff of left shoulder, initial encounter: Secondary | ICD-10-CM

## 2021-06-23 DIAGNOSIS — S46112A Strain of muscle, fascia and tendon of long head of biceps, left arm, initial encounter: Secondary | ICD-10-CM | POA: Diagnosis not present

## 2021-06-30 DIAGNOSIS — S46002A Unspecified injury of muscle(s) and tendon(s) of the rotator cuff of left shoulder, initial encounter: Secondary | ICD-10-CM | POA: Diagnosis not present

## 2021-07-08 DIAGNOSIS — R053 Chronic cough: Secondary | ICD-10-CM | POA: Diagnosis not present

## 2021-07-08 DIAGNOSIS — J4540 Moderate persistent asthma, uncomplicated: Secondary | ICD-10-CM | POA: Diagnosis not present

## 2021-07-08 DIAGNOSIS — R0609 Other forms of dyspnea: Secondary | ICD-10-CM | POA: Diagnosis not present

## 2021-07-10 DIAGNOSIS — J449 Chronic obstructive pulmonary disease, unspecified: Secondary | ICD-10-CM | POA: Diagnosis not present

## 2021-07-20 DIAGNOSIS — M25512 Pain in left shoulder: Secondary | ICD-10-CM | POA: Diagnosis not present

## 2021-07-20 DIAGNOSIS — M25612 Stiffness of left shoulder, not elsewhere classified: Secondary | ICD-10-CM | POA: Diagnosis not present

## 2021-07-20 DIAGNOSIS — M6281 Muscle weakness (generalized): Secondary | ICD-10-CM | POA: Diagnosis not present

## 2021-08-04 DIAGNOSIS — J449 Chronic obstructive pulmonary disease, unspecified: Secondary | ICD-10-CM | POA: Diagnosis not present

## 2021-08-04 DIAGNOSIS — R0609 Other forms of dyspnea: Secondary | ICD-10-CM | POA: Diagnosis not present

## 2021-08-04 DIAGNOSIS — R062 Wheezing: Secondary | ICD-10-CM | POA: Diagnosis not present

## 2021-08-10 ENCOUNTER — Other Ambulatory Visit: Payer: Self-pay | Admitting: Family Medicine

## 2021-08-10 DIAGNOSIS — E785 Hyperlipidemia, unspecified: Secondary | ICD-10-CM

## 2021-08-10 DIAGNOSIS — I10 Essential (primary) hypertension: Secondary | ICD-10-CM

## 2021-08-10 DIAGNOSIS — J449 Chronic obstructive pulmonary disease, unspecified: Secondary | ICD-10-CM | POA: Diagnosis not present

## 2021-08-21 DIAGNOSIS — E1129 Type 2 diabetes mellitus with other diabetic kidney complication: Secondary | ICD-10-CM | POA: Diagnosis not present

## 2021-08-21 DIAGNOSIS — M1611 Unilateral primary osteoarthritis, right hip: Secondary | ICD-10-CM | POA: Diagnosis not present

## 2021-08-21 DIAGNOSIS — G8929 Other chronic pain: Secondary | ICD-10-CM | POA: Diagnosis not present

## 2021-08-21 DIAGNOSIS — M545 Low back pain, unspecified: Secondary | ICD-10-CM | POA: Diagnosis not present

## 2021-08-21 DIAGNOSIS — I700 Atherosclerosis of aorta: Secondary | ICD-10-CM | POA: Diagnosis not present

## 2021-08-21 DIAGNOSIS — R809 Proteinuria, unspecified: Secondary | ICD-10-CM | POA: Diagnosis not present

## 2021-08-21 DIAGNOSIS — M1712 Unilateral primary osteoarthritis, left knee: Secondary | ICD-10-CM | POA: Diagnosis not present

## 2021-08-21 DIAGNOSIS — M48062 Spinal stenosis, lumbar region with neurogenic claudication: Secondary | ICD-10-CM | POA: Diagnosis not present

## 2021-08-24 DIAGNOSIS — M1712 Unilateral primary osteoarthritis, left knee: Secondary | ICD-10-CM | POA: Diagnosis not present

## 2021-08-24 DIAGNOSIS — E1129 Type 2 diabetes mellitus with other diabetic kidney complication: Secondary | ICD-10-CM | POA: Diagnosis not present

## 2021-08-30 ENCOUNTER — Other Ambulatory Visit: Payer: Self-pay | Admitting: Family Medicine

## 2021-08-30 DIAGNOSIS — I10 Essential (primary) hypertension: Secondary | ICD-10-CM

## 2021-08-31 ENCOUNTER — Other Ambulatory Visit: Payer: Self-pay

## 2021-08-31 ENCOUNTER — Ambulatory Visit (INDEPENDENT_AMBULATORY_CARE_PROVIDER_SITE_OTHER): Payer: Medicare Other

## 2021-08-31 DIAGNOSIS — Z23 Encounter for immunization: Secondary | ICD-10-CM | POA: Diagnosis not present

## 2021-09-08 DIAGNOSIS — E1129 Type 2 diabetes mellitus with other diabetic kidney complication: Secondary | ICD-10-CM | POA: Diagnosis not present

## 2021-09-08 DIAGNOSIS — R809 Proteinuria, unspecified: Secondary | ICD-10-CM | POA: Diagnosis not present

## 2021-09-08 DIAGNOSIS — M170 Bilateral primary osteoarthritis of knee: Secondary | ICD-10-CM | POA: Diagnosis not present

## 2021-09-08 DIAGNOSIS — M25561 Pain in right knee: Secondary | ICD-10-CM | POA: Diagnosis not present

## 2021-09-08 DIAGNOSIS — G8929 Other chronic pain: Secondary | ICD-10-CM | POA: Diagnosis not present

## 2021-09-10 ENCOUNTER — Other Ambulatory Visit: Payer: Self-pay

## 2021-09-10 DIAGNOSIS — E785 Hyperlipidemia, unspecified: Secondary | ICD-10-CM

## 2021-09-10 DIAGNOSIS — I10 Essential (primary) hypertension: Secondary | ICD-10-CM

## 2021-09-10 DIAGNOSIS — J301 Allergic rhinitis due to pollen: Secondary | ICD-10-CM

## 2021-09-14 ENCOUNTER — Other Ambulatory Visit: Payer: Self-pay | Admitting: Family Medicine

## 2021-09-14 DIAGNOSIS — E785 Hyperlipidemia, unspecified: Secondary | ICD-10-CM

## 2021-09-14 DIAGNOSIS — I10 Essential (primary) hypertension: Secondary | ICD-10-CM

## 2021-09-14 MED ORDER — AMLODIPINE BESYLATE 2.5 MG PO TABS: ORAL_TABLET | ORAL | 1 refills | Status: DC

## 2021-09-14 NOTE — Telephone Encounter (Signed)
Requested Prescriptions  Pending Prescriptions Disp Refills  . atorvastatin (LIPITOR) 40 MG tablet 90 tablet 0     Cardiovascular:  Antilipid - Statins Failed - 09/14/2021  5:03 PM      Failed - Total Cholesterol in normal range and within 360 days    Cholesterol, Total  Date Value Ref Range Status  01/24/2018 185 100 - 199 mg/dL Final         Failed - LDL in normal range and within 360 days    LDL Calculated  Date Value Ref Range Status  01/24/2018 88 0 - 99 mg/dL Final         Failed - HDL in normal range and within 360 days    HDL  Date Value Ref Range Status  01/24/2018 66 >39 mg/dL Final         Failed - Triglycerides in normal range and within 360 days    Triglycerides  Date Value Ref Range Status  01/24/2018 154 (H) 0 - 149 mg/dL Final         Passed - Patient is not pregnant      Passed - Valid encounter within last 12 months    Recent Outpatient Visits          4 months ago Essential hypertension   [facility] Duanne Limerick, MD   6 months ago Frequent urination   [facility] Duanne Limerick, MD   7 months ago Essential hypertension   [facility] Duanne Limerick, MD   7 months ago Essential hypertension   [facility] Duanne Limerick, MD   12 months ago Essential hypertension   [facility] Duanne Limerick, MD      Future Appointments            In 4 weeks Duanne Limerick, MD [facility]           . amLODipine (NORVASC) 2.5 MG tablet 90 tablet 1     Cardiovascular:  Calcium Channel Blockers Passed - 09/14/2021  5:03 PM      Passed - Last BP in normal range    BP Readings from Last 1 Encounters:  04/28/21 110/80         Passed - Valid encounter within last 6 months    Recent Outpatient Visits          4 months ago Essential hypertension   [facility] Duanne Limerick, MD   6 months ago Frequent urination   [facility] Duanne Limerick, MD   7 months ago  Essential hypertension   [facility] Duanne Limerick, MD   7 months ago Essential hypertension   [facility] Duanne Limerick, MD   12 months ago Essential hypertension   [facility] Duanne Limerick, MD      Future Appointments            In 4 weeks Duanne Limerick, MD [facility], [facility]

## 2021-09-14 NOTE — Telephone Encounter (Signed)
Caller name: Annice Pih  Relation to pt: from [facility] ([facility])  Call back number: [phone] fax # [phone]  [phone] # [phone]   Reason for call:  Caller requesting a 90 day supply amLODipine (NORVASC) 2.5 MG tablet, atorvastatin (LIPITOR) 40 MG tablet, atorvastatin (LIPITOR) 40 MG tablet.  Patient has a future appointment scheduled.

## 2021-09-14 NOTE — Telephone Encounter (Signed)
Requested medication (s) are due for refill today: Yes  Requested medication (s) are on the active medication list: Yes  Last refill:  08/11/21  Future visit scheduled:Yes  Notes to clinic:  Unable to refill per protocol due to failed labs, no updated results.      Requested Prescriptions  Pending Prescriptions Disp Refills   atorvastatin (LIPITOR) 40 MG tablet 90 tablet 0     Cardiovascular:  Antilipid - Statins Failed - 09/14/2021  5:03 PM      Failed - Total Cholesterol in normal range and within 360 days    Cholesterol, Total  Date Value Ref Range Status  01/24/2018 185 100 - 199 mg/dL Final          Failed - LDL in normal range and within 360 days    LDL Calculated  Date Value Ref Range Status  01/24/2018 88 0 - 99 mg/dL Final          Failed - HDL in normal range and within 360 days    HDL  Date Value Ref Range Status  01/24/2018 66 >39 mg/dL Final          Failed - Triglycerides in normal range and within 360 days    Triglycerides  Date Value Ref Range Status  01/24/2018 154 (H) 0 - 149 mg/dL Final          Passed - Patient is not pregnant      Passed - Valid encounter within last 12 months    Recent Outpatient Visits           4 months ago Essential hypertension   [facility] Duanne Limerick, MD   6 months ago Frequent urination   [facility] Duanne Limerick, MD   7 months ago Essential hypertension   [facility] Duanne Limerick, MD   7 months ago Essential hypertension   [facility] Duanne Limerick, MD   12 months ago Essential hypertension   [facility] Duanne Limerick, MD       Future Appointments             In 4 weeks Duanne Limerick, MD [facility]            Signed Prescriptions Disp Refills   amLODipine (NORVASC) 2.5 MG tablet 90 tablet 1    Sig: TAKE 1 TABLET(2.5 MG) BY MOUTH DAILY     Cardiovascular:  Calcium Channel Blockers Passed - 09/14/2021  5:03 PM       Passed - Last BP in normal range    BP Readings from Last 1 Encounters:  04/28/21 110/80          Passed - Valid encounter within last 6 months    Recent Outpatient Visits           4 months ago Essential hypertension   [facility] Duanne Limerick, MD   6 months ago Frequent urination   [facility] Duanne Limerick, MD   7 months ago Essential hypertension   [facility] Duanne Limerick, MD   7 months ago Essential hypertension   [facility] Duanne Limerick, MD   12 months ago Essential hypertension   [facility] Duanne Limerick, MD       Future Appointments             In 4 weeks Duanne Limerick, MD [facility], [facility]

## 2021-09-15 ENCOUNTER — Other Ambulatory Visit: Payer: Self-pay | Admitting: Family Medicine

## 2021-09-15 DIAGNOSIS — J301 Allergic rhinitis due to pollen: Secondary | ICD-10-CM

## 2021-09-15 DIAGNOSIS — E785 Hyperlipidemia, unspecified: Secondary | ICD-10-CM

## 2021-09-15 DIAGNOSIS — I10 Essential (primary) hypertension: Secondary | ICD-10-CM

## 2021-09-15 MED ORDER — ATORVASTATIN CALCIUM 40 MG PO TABS: ORAL_TABLET | ORAL | 0 refills | Status: DC

## 2021-09-15 MED ORDER — HYDROCHLOROTHIAZIDE 25 MG PO TABS: 25.0000 mg | ORAL_TABLET | Freq: Every day | ORAL | 0 refills | Status: DC

## 2021-09-15 MED ORDER — METOPROLOL TARTRATE 50 MG PO TABS: ORAL_TABLET | ORAL | 0 refills | Status: DC

## 2021-09-15 MED ORDER — GEMFIBROZIL 600 MG PO TABS: ORAL_TABLET | ORAL | 1 refills | Status: DC

## 2021-09-15 MED ORDER — MONTELUKAST SODIUM 10 MG PO TABS: 10.0000 mg | ORAL_TABLET | Freq: Every day | ORAL | 1 refills | Status: DC

## 2021-09-15 NOTE — Telephone Encounter (Signed)
Requested Prescriptions  Pending Prescriptions Disp Refills  . montelukast (SINGULAIR) 10 MG tablet 90 tablet 1    Sig: Take 1 tablet (10 mg total) by mouth daily.     Pulmonology:  Leukotriene Inhibitors Passed - 09/15/2021 11:31 AM      Passed - Valid encounter within last 12 months    Recent Outpatient Visits          4 months ago Essential hypertension   [facility] Juline Patch, MD   6 months ago Frequent urination   [facility] Juline Patch, MD   7 months ago Essential hypertension   [facility] Juline Patch, MD   7 months ago Essential hypertension   [facility] Juline Patch, MD   1 year ago Essential hypertension   [facility] Juline Patch, MD      Future Appointments            In 3 weeks Juline Patch, MD [facility]           . metoprolol tartrate (LOPRESSOR) 50 MG tablet 180 tablet 0    Sig: TAKE 1 TABLET(50 MG) BY MOUTH TWICE DAILY     Cardiovascular:  Beta Blockers Passed - 09/15/2021 11:31 AM      Passed - Last BP in normal range    BP Readings from Last 1 Encounters:  04/28/21 110/80         Passed - Last Heart Rate in normal range    Pulse Readings from Last 1 Encounters:  04/28/21 80         Passed - Valid encounter within last 6 months    Recent Outpatient Visits          4 months ago Essential hypertension   [facility] Juline Patch, MD   6 months ago Frequent urination   [facility] Juline Patch, MD   7 months ago Essential hypertension   [place], MD   7 months ago Essential hypertension   [place], Deanna C, MD   1 year ago Essential hypertension   [place], Deanna C, MD      Future Appointments            In 3 weeks Juline Patch, MD [facility]           . gemfibrozil (LOPID) 600 MG tablet 180 tablet 1    Sig: TAKE 1 TABLET(600 MG) BY  MOUTH TWICE DAILY     Cardiovascular:  Antilipid - Fibric Acid Derivatives Failed - 09/15/2021 11:31 AM      Failed - Total Cholesterol in normal range and within 360 days    Cholesterol, Total  Date Value Ref Range Status  01/24/2018 185 100 - 199 mg/dL Final         Failed - LDL in normal range and within 360 days    LDL Calculated  Date Value Ref Range Status  01/24/2018 88 0 - 99 mg/dL Final         Failed - HDL in normal range and within 360 days    HDL  Date Value Ref Range Status  01/24/2018 66 >39 mg/dL Final         Failed - Triglycerides in normal range and within 360 days    Triglycerides  Date Value Ref Range Status  01/24/2018 154 (H) 0 -  149 mg/dL Final         Failed - ALT in normal range and within 180 days    ALT  Date Value Ref Range Status  09/12/2020 18 0 - 44 U/L Final   SGPT (ALT)  Date Value Ref Range Status  02/29/2012 30 U/L Final    Comment:    12-78 NOTE: NEW REFERENCE RANGE 10/29/2011          Failed - AST in normal range and within 180 days    AST  Date Value Ref Range Status  09/12/2020 18 15 - 41 U/L Final   SGOT(AST)  Date Value Ref Range Status  02/29/2012 27 15 - 37 Unit/L Final         Failed - Cr in normal range and within 180 days    Creatinine  Date Value Ref Range Status  02/29/2012 1.16 0.60 - 1.30 mg/dL Final   Creatinine, Ser  Date Value Ref Range Status  09/12/2020 1.32 (H) 0.44 - 1.00 mg/dL Final         Failed - eGFR in normal range and within 180 days    EGFR (African American)  Date Value Ref Range Status  02/29/2012 >60 >42m/min Final   GFR calc Af Amer  Date Value Ref Range Status  10/03/2019 52 (L) >60 mL/min Final    Comment:    [facility], [address], [place] [id number]  EGFR (Laqueta Jean)  Date Value Ref Range Status  02/29/2012 50 (L) >686mmin Final    Comment:    eGFR values <6061min/1.73 m2 may be an indication of chronic kidney  disease (CKD). Calculated eGFR, using the MRDR Study equation, is useful in  patients with stable renal function. The eGFR calculation will not be reliable in acutely ill patients when serum creatinine is changing rapidly. It is not useful in patients on dialysis. The eGFR calculation may not be applicable to patients at the low and high extremes of body sizes, pregnant women, and vegetarians.    GFR, Estimated  Date Value Ref Range Status  09/12/2020 40 (L) >60 mL/min Final         Passed - Valid encounter within last 12 months    Recent Outpatient Visits          4 months ago [facility]   [facility]   6 months ago [facility]requent urination   [facility]   7 [facility]   [facility]   7 months ago [facility] hypertension   [facility]   1 year ago [facility]   [facility]      Future App[facility]ments            In 3 [facility]           . hydrochlorothiazide (HYDRODIURIL) 25 MG tablet 90 tablet 0    Sig: Take 1 tablet (25 mg total) by mouth daily.     Cardiovascular: Diuretics - Thiazide Failed - 09/15/2021 11:31 AM      Failed - Ca in normal range and within 360 days    Calcium  Date Value Ref Range Status  09/12/2020 9.9 8.9 - 10.3 mg/dL Final   Calcium, Total  Date Value Ref Range Status  02/29/2012 9.2 8.5 -  10.1 mg/dL Final         Failed - Cr in normal range and within 360 days    Creatinine  Date Value Ref Range Status  02/29/2012 1.16 0.60 - 1.30 mg/dL Final   Creatinine, Ser  Date Value Ref Range Status  09/12/2020 1.32 (H) 0.44 - 1.00 mg/dL Final         Failed - K in normal range and within 360 days    Potassium  Date Value Ref Range Status  09/12/2020 3.9 3.5 - 5.1 mmol/L Final  08/08/2014 4.7 3.5 - 5.1 mmol/L  Final         Failed - Na in normal range and within 360 days    Sodium  Date Value Ref Range Status  09/12/2020 135 135 - 145 mmol/L Final  01/24/2018 141 134 - 144 mmol/L Final  02/29/2012 142 136 - 145 mmol/L Final         Passed - Last BP in normal range    BP Readings from Last 1 Encounters:  04/28/21 110/80         Passed - Valid encounter within last 6 months    Recent Outpatient Visits          4 months ago Essential hypertension   [place], MD   6 months ago Frequent urination   [facility] Juline Patch, MD   7 months ago Essential hypertension   [place], Deanna C, MD   7 months ago Essential hypertension   [place], Deanna C, MD   1 year ago Essential hypertension   [place], Deanna C, MD      Future Appointments            In 3 weeks Juline Patch, MD [facility], [facility]

## 2021-09-15 NOTE — Telephone Encounter (Signed)
Medication Refill - Medication: Metoprolol 50 mg,  Montelukast 1 mg  /hydrochlorothiazide  25 mg,  Gemfibrozil 600 mg  Has the patient contacted their pharmacy? Yes.   (Agent: If no, request that the patient contact the pharmacy for the refill.) (Agent: If yes, when and what did the pharmacy advise?)  Preferred Pharmacy (with phone number or street name): [facility]  Has the patient been seen for an appointment in the last year OR does the patient have an upcoming appointment? Yes.    Agent: Please be advised that RX refills may take up to 3 business days. We ask that you follow-up with your pharmacy.

## 2021-09-25 ENCOUNTER — Ambulatory Visit: Payer: Self-pay | Admitting: *Deleted

## 2021-09-25 NOTE — Telephone Encounter (Signed)
Jennifer with [facility] is calling in regards to a prescription drug that may have interaction with another drug that the patient is taking.   They would like a call back   CB#  [phone]   [facility] and spoke with Jackelyn Knife, pharmacy tech regarding drug interaction of medications. Please verify if patient is to continue taking gemfibrozil 600 mg and atorvastatin 40 mg due to increased risk of myopathy. Please contact [facility] [phone].

## 2021-10-12 ENCOUNTER — Encounter: Payer: Self-pay | Admitting: Family Medicine

## 2021-10-12 ENCOUNTER — Ambulatory Visit (INDEPENDENT_AMBULATORY_CARE_PROVIDER_SITE_OTHER): Payer: Medicare HMO | Admitting: Family Medicine

## 2021-10-12 ENCOUNTER — Other Ambulatory Visit: Payer: Self-pay

## 2021-10-12 VITALS — BP 120/88 | HR 80 | Ht 61.0 in | Wt 132.0 lb

## 2021-10-12 DIAGNOSIS — I10 Essential (primary) hypertension: Secondary | ICD-10-CM | POA: Diagnosis not present

## 2021-10-12 DIAGNOSIS — E785 Hyperlipidemia, unspecified: Secondary | ICD-10-CM

## 2021-10-12 DIAGNOSIS — J301 Allergic rhinitis due to pollen: Secondary | ICD-10-CM | POA: Diagnosis not present

## 2021-10-12 MED ORDER — MONTELUKAST SODIUM 10 MG PO TABS: 10.0000 mg | ORAL_TABLET | Freq: Every day | ORAL | 1 refills | Status: DC

## 2021-10-12 MED ORDER — HYDROCHLOROTHIAZIDE 25 MG PO TABS: 25.0000 mg | ORAL_TABLET | Freq: Every day | ORAL | 1 refills | Status: DC

## 2021-10-12 MED ORDER — METOPROLOL TARTRATE 50 MG PO TABS: ORAL_TABLET | ORAL | 1 refills | Status: DC

## 2021-10-12 MED ORDER — AMLODIPINE BESYLATE 2.5 MG PO TABS: ORAL_TABLET | ORAL | 1 refills | Status: DC

## 2021-10-12 MED ORDER — ATORVASTATIN CALCIUM 40 MG PO TABS: ORAL_TABLET | ORAL | 1 refills | Status: DC

## 2021-10-12 MED ORDER — GEMFIBROZIL 600 MG PO TABS: ORAL_TABLET | ORAL | 1 refills | Status: DC

## 2021-10-12 NOTE — Progress Notes (Signed)
Date:  10/12/2021   Name:  Judy Bolton   DOB:  1946/07/05   MRN:  [id number]   Chief Complaint: Hypertension, Hyperlipidemia, and Allergic Rhinitis   Hypertension This is a chronic problem. The current episode started more than 1 year ago. The problem has been gradually improving since onset. The problem is controlled. Pertinent negatives include no anxiety, blurred vision, chest pain, headaches, malaise/fatigue, neck pain, orthopnea, palpitations, peripheral edema, PND, shortness of breath or sweats. There are no associated agents to hypertension. Risk factors for coronary artery disease include dyslipidemia. Past treatments include calcium channel blockers, beta blockers and diuretics. The current treatment provides moderate improvement. There are no compliance problems.  There is no history of angina, kidney disease, CAD/MI, CVA, heart failure, left ventricular hypertrophy, PVD or retinopathy. There is no history of chronic renal disease, a hypertension causing med or renovascular disease.  Hyperlipidemia This is a chronic problem. The current episode started more than 1 year ago. The problem is controlled. Recent lipid tests were reviewed and are normal. She has no history of chronic renal disease. Pertinent negatives include no chest pain, leg pain, myalgias or shortness of breath. Current antihyperlipidemic treatment includes statins. The current treatment provides moderate improvement of lipids. There are no compliance problems.  Risk factors for coronary artery disease include hypertension and dyslipidemia.  Asthma She complains of wheezing. There is no cough or shortness of breath. Primary symptoms comments: episodic. This is a chronic problem. The current episode started more than 1 year ago. The problem occurs intermittently. The problem has been gradually improving. Pertinent negatives include no chest pain, ear pain, fever, headaches, malaise/fatigue, myalgias, PND, sore throat or  sweats. Her past medical history is significant for asthma.   Lab Results  Component Value Date   CREATININE 1.32 (H) 09/12/2020   BUN 37 (H) 09/12/2020   NA 135 09/12/2020   K 3.9 09/12/2020   CL 99 09/12/2020   CO2 26 09/12/2020   Lab Results  Component Value Date   CHOL 185 01/24/2018   HDL 66 01/24/2018   LDLCALC 88 01/24/2018   TRIG 154 (H) 01/24/2018   CHOLHDL 2.8 01/24/2018   No results found for: TSH Lab Results  Component Value Date   HGBA1C 7.2 12/13/2020   Lab Results  Component Value Date   WBC 11.8 (H) 09/12/2020   HGB 13.4 09/12/2020   HCT 38.9 09/12/2020   MCV 85.7 09/12/2020   PLT 252 09/12/2020   Lab Results  Component Value Date   ALT 18 09/12/2020   AST 18 09/12/2020   ALKPHOS 61 09/12/2020   BILITOT 0.6 09/12/2020     Review of Systems  Constitutional:  Negative for chills, fever and malaise/fatigue.  HENT:  Negative for drooling, ear discharge, ear pain and sore throat.   Eyes:  Negative for blurred vision.  Respiratory:  Positive for chest tightness and wheezing. Negative for cough and shortness of breath.   Cardiovascular:  Negative for chest pain, palpitations, orthopnea, leg swelling and PND.  Gastrointestinal:  Negative for abdominal pain, blood in stool, constipation, diarrhea and nausea.  Endocrine: Negative for polydipsia.  Genitourinary:  Negative for dysuria, frequency, hematuria and urgency.  Musculoskeletal:  Negative for back pain, myalgias and neck pain.  Skin:  Negative for rash.  Allergic/Immunologic: Negative for environmental allergies.  Neurological:  Negative for dizziness and headaches.  Hematological:  Does not bruise/bleed easily.  Psychiatric/Behavioral:  Negative for suicidal ideas. The patient is not nervous/anxious.  Patient Active Problem List   Diagnosis Date Noted   Essential hypertension 08/30/2017   Gastroesophageal reflux disease 08/30/2017   Aortic atherosclerosis ([place]) 05/31/2017   PAD (peripheral  artery disease) ([place]) 05/31/2017   Encounter for long-term (current) use of high-risk medication 12/28/2016   Seronegative rheumatoid arthritis ([place]) 12/28/2016   Bilateral hand pain 11/08/2016   Elevated C-reactive protein 11/08/2016   Swelling of joint of right hand 11/08/2016   Type 2 diabetes mellitus with microalbuminuria, without long-term current use of insulin ([place]) 07/06/2016   Acute anxiety 04/16/2016   Bronchitis 04/16/2016   Centrilobular emphysema ([place]) 04/16/2016   TIA (transient ischemic attack) 02/29/2016   Lumbar stenosis with neurogenic claudication 04/16/2015   Spondylosis of lumbar region without myelopathy or radiculopathy 04/16/2015   Hyperlipidemia 10/15/2014    Allergies  Allergen Reactions   Betadine [Povidone Iodine] Swelling   Codeine Itching   Latex Itching and Swelling    (urinary catheter)   Losartan     intolerance   Meloxicam Swelling   Penicillins Other (See Comments)    Paralysis  Has patient had a PCN reaction causing immediate rash, facial/tongue/throat swelling, SOB or lightheadedness with hypotension: No Has patient had a PCN reaction causing severe rash involving mucus membranes or skin necrosis: No Has patient had a PCN reaction that required hospitalization Yes Has patient had a PCN reaction occurring within the last 10 years: No If all of the above answers are "NO", then may proceed with Cephalosporin use.   Valsartan     Past Surgical History:  Procedure Laterality Date   ABDOMINAL HYSTERECTOMY     ANTERIOR (CYSTOCELE) AND POSTERIOR REPAIR (RECTOCELE) WITH XENFORM GRAFT AND SACROSPINOUS FIXATION     APPENDECTOMY     BROW LIFT Bilateral 09/05/2018   Procedure: BLEPHAROPLASTY UPPER EYELID W/EXCESS SKIN;  Surgeon: Karle Starch, MD;  Location: [facility];  Service: Ophthalmology;  Laterality: Bilateral;   CTR Bilateral    LUMBAR LAMINECTOMY/DECOMPRESSION MICRODISCECTOMY N/A 07/27/2017   Procedure: LUMBAR  LAMINECTOMY/DECOMPRESSION MICRODISCECTOMY 3 LEVELS-L3-S1;  Surgeon: Meade Maw, MD;  Location: [facility];  Service: Neurosurgery;  Laterality: N/A;   PTOSIS REPAIR Bilateral 09/05/2018   Procedure: PTOSIS REPAIR RESECT EX;  Surgeon: Karle Starch, MD;  Location: [facility];  Service: Ophthalmology;  Laterality: Bilateral;  Diabetic - oral meds sleep apnea Latexd allergy   ROTATOR CUFF REPAIR Right     Social History   Tobacco Use   Smoking status: Former    Packs/day: 1.50    Years: 28.00    Pack years: 42.00    Types: Cigarettes    Quit date: 08/14/2000    Years since quitting: 21.1   Smokeless tobacco: Never   Tobacco comments:    smoking cessatiuon materials not required  Vaping Use   Vaping Use: Never used  Substance Use Topics   Alcohol use: No   Drug use: No     Medication list has been reviewed and updated.  Current Meds  Medication Sig   albuterol (PROVENTIL HFA;VENTOLIN HFA) 108 (90 Base) MCG/ACT inhaler Inhale 2 puffs into the lungs every 6 (six) hours as needed for wheezing or shortness of breath.   albuterol (PROVENTIL) (2.5 MG/3ML) 0.083% nebulizer solution Take 3 mLs (2.5 mg total) by nebulization every 6 (six) hours as needed for wheezing or shortness of breath.   amLODipine (NORVASC) 2.5 MG tablet TAKE 1 TABLET(2.5 MG) BY MOUTH DAILY   aspirin EC 81 MG tablet Take by mouth.   atorvastatin (  LIPITOR) 40 MG tablet TAKE 1 TABLET(40 MG) BY MOUTH DAILY   celecoxib (CELEBREX) 200 MG capsule Take 200 mg by mouth daily. mundy   cholecalciferol (VITAMIN D3) 25 MCG (1000 UNIT) tablet Take 1,000 Units by mouth daily.   cloNIDine (CATAPRES) 0.1 MG tablet Take 1 tablet (0.1 mg total) by mouth 2 (two) times daily as needed. (Patient taking differently: Take 0.1 mg by mouth 2 (two) times daily as needed. Takes 1/2 tablet as needed twice a day)   docusate sodium (COLACE) 100 MG capsule Take 100 mg by mouth daily.   gabapentin (NEURONTIN) 100 MG capsule Take 2  capsules by mouth at bedtime. mundy   gemfibrozil (LOPID) 600 MG tablet TAKE 1 TABLET(600 MG) BY MOUTH TWICE DAILY   glipiZIDE (GLUCOTROL) 5 MG tablet Take 5 mg by mouth every morning.   hydrochlorothiazide (HYDRODIURIL) 25 MG tablet Take 1 tablet (25 mg total) by mouth daily.   ipratropium (ATROVENT) 0.02 % nebulizer solution Inhale into the lungs.   metFORMIN (GLUCOPHAGE) 500 MG tablet Take 500 mg by mouth 2 (two) times daily.   metoprolol tartrate (LOPRESSOR) 50 MG tablet TAKE 1 TABLET(50 MG) BY MOUTH TWICE DAILY   montelukast (SINGULAIR) 10 MG tablet Take 1 tablet (10 mg total) by mouth daily.   omeprazole (PRILOSEC) 20 MG capsule Take 20 mg by mouth daily. otc   vitamin B-12 (CYANOCOBALAMIN) 1000 MCG tablet Take 1,000 mcg by mouth daily.   [DISCONTINUED] Investigational - Study Medication Study name: Diabetes medication - oral tablet once daily Additional study details: Dr. Dareen Piano with [facility]    [facility] 2/9 Scores 10/12/2021 03/09/2021 01/29/2021 07/31/2020  PHQ - 2 Score 0 0 0 0  PHQ- 9 Score 0 - 0 0    GAD 7 : Generalized Anxiety Score 10/12/2021 01/29/2021 07/31/2020 05/02/2020  Nervous, Anxious, on Edge 0 0 0 0  Control/stop worrying 0 0 0 0  Worry too much - different things 0 0 0 0  Trouble relaxing 0 0 0 0  Restless 0 0 0 0  Easily annoyed or irritable 0 0 0 0  Afraid - awful might happen 0 0 0 0  Total GAD 7 Score 0 0 0 0  Anxiety Difficulty - - - -    BP Readings from Last 3 Encounters:  10/12/21 120/88  04/28/21 110/80  03/09/21 130/82    Physical Exam Vitals and nursing note reviewed.  Constitutional:      General: She is not in acute distress.    Appearance: She is not diaphoretic.  HENT:     Head: Normocephalic and atraumatic.     Right Ear: Tympanic membrane, ear canal and external ear normal.     Left Ear: Tympanic membrane, ear canal and external ear normal.     Nose: Nose normal. No congestion or rhinorrhea.  Eyes:     General:        Right eye: No  discharge.        Left eye: No discharge.     Conjunctiva/sclera: Conjunctivae normal.     Pupils: Pupils are equal, round, and reactive to light.  Neck:     Thyroid: No thyromegaly.     Vascular: No JVD.  Cardiovascular:     Rate and Rhythm: Normal rate and regular rhythm.     Heart sounds: Normal heart sounds. No murmur heard.   No friction rub. No gallop.  Pulmonary:     Effort: Pulmonary effort is normal.     Breath  sounds: Normal breath sounds. No wheezing, rhonchi or rales.  Abdominal:     General: Bowel sounds are normal.     Palpations: Abdomen is soft. There is no mass.     Tenderness: There is no abdominal tenderness. There is no guarding.  Musculoskeletal:        General: Normal range of motion.     Cervical back: Normal range of motion and neck supple.  Lymphadenopathy:     Cervical: No cervical adenopathy.  Skin:    General: Skin is warm and dry.  Neurological:     General: No focal deficit present.     Mental Status: She is alert.     Deep Tendon Reflexes: Reflexes are normal and symmetric.    Wt Readings from Last 3 Encounters:  10/12/21 132 lb (59.9 kg)  04/28/21 150 lb (68 kg)  03/09/21 148 lb (67.1 kg)    BP 120/88   Pulse 80   Ht 5\' 1"  (1.549 m)   Wt 132 lb (59.9 kg)   BMI 24.94 kg/m   Assessment and Plan:  1. Essential hypertension Chronic.  Controlled.  Stable.  Blood pressure today is 120/88.  Continue hydrochlorothiazide 25 mg once a day amlodipine 2.5 mg once a day and metoprolol 50 mg twice a day.  Will check CMP for electrolytes and GFR. - amLODipine (NORVASC) 2.5 MG tablet; TAKE 1 TABLET(2.5 MG) BY MOUTH DAILY  Dispense: 90 tablet; Refill: 1 - hydrochlorothiazide (HYDRODIURIL) 25 MG tablet; Take 1 tablet (25 mg total) by mouth daily.  Dispense: 90 tablet; Refill: 1 - metoprolol tartrate (LOPRESSOR) 50 MG tablet; TAKE 1 TABLET(50 MG) BY MOUTH TWICE DAILY  Dispense: 180 tablet; Refill: 1 - Comprehensive Metabolic Panel (CMET)  2.  Hyperlipidemia, unspecified hyperlipidemia type Chronic.  Controlled.  Stable.  Continue atorvastatin 40 mg once a day and gemfibrozil 600 mg twice a day.  Will check lipid panel. - atorvastatin (LIPITOR) 40 MG tablet; TAKE 1 TABLET(40 MG) BY MOUTH DAILY  Dispense: 90 tablet; Refill: 1 - gemfibrozil (LOPID) 600 MG tablet; TAKE 1 TABLET(600 MG) BY MOUTH TWICE DAILY  Dispense: 180 tablet; Refill: 1 - Lipid Panel With LDL/HDL Ratio  3. Essential hypertension Chronic.  As noted above.  Chronic.  Controlled.  Stable.  Patient is using Singulair 10 mg once - amLODipine (NORVASC) 2.5 MG tablet; TAKE 1 TABLET(2.5 MG) BY MOUTH DAILY  Dispense: 90 tablet; Refill: 1 - hydrochlorothiazide (HYDRODIURIL) 25 MG tablet; Take 1 tablet (25 mg total) by mouth daily.  Dispense: 90 tablet; Refill: 1 - metoprolol tartrate (LOPRESSOR) 50 MG tablet; TAKE 1 TABLET(50 MG) BY MOUTH TWICE DAILY  Dispense: 180 tablet; Refill: 1 - Comprehensive Metabolic Panel (CMET)  4. Seasonal allergic rhinitis due to pollen Chronic.  Controlled.  Stable.  Patient with episodic wheezing episodes controlled on albuterol per pulmonary.  Patient will continue Singulair 10 mg once a day. - montelukast (SINGULAIR) 10 MG tablet; Take 1 tablet (10 mg total) by mouth daily.  Dispense: 90 tablet; Refill: 1

## 2021-10-13 LAB — COMPREHENSIVE METABOLIC PANEL
ALT: 16 IU/L (ref 0–32)
AST: 23 IU/L (ref 0–40)
Albumin/Globulin Ratio: 2.2 (ref 1.2–2.2)
Albumin: 4.6 g/dL (ref 3.7–4.7)
Alkaline Phosphatase: 104 IU/L (ref 44–121)
BUN/Creatinine Ratio: 21 (ref 12–28)
BUN: 27 mg/dL (ref 8–27)
Bilirubin Total: 0.3 mg/dL (ref 0.0–1.2)
CO2: 24 mmol/L (ref 20–29)
Calcium: 10 mg/dL (ref 8.7–10.3)
Chloride: 102 mmol/L (ref 96–106)
Creatinine, Ser: 1.27 mg/dL — ABNORMAL HIGH (ref 0.57–1.00)
Globulin, Total: 2.1 g/dL (ref 1.5–4.5)
Glucose: 109 mg/dL — ABNORMAL HIGH (ref 70–99)
Potassium: 4.6 mmol/L (ref 3.5–5.2)
Sodium: 141 mmol/L (ref 134–144)
Total Protein: 6.7 g/dL (ref 6.0–8.5)
eGFR: 45 mL/min/{1.73_m2} — ABNORMAL LOW (ref >59)

## 2021-10-13 LAB — LIPID PANEL WITH LDL/HDL RATIO
Cholesterol, Total: 179 mg/dL (ref 100–199)
HDL: 53 mg/dL (ref >39)
LDL Chol Calc (NIH): 103 mg/dL — ABNORMAL HIGH (ref 0–99)
LDL/HDL Ratio: 1.9 ratio (ref 0.0–3.2)
Triglycerides: 128 mg/dL (ref 0–149)
VLDL Cholesterol Cal: 23 mg/dL (ref 5–40)

## 2021-11-10 ENCOUNTER — Encounter: Payer: Self-pay | Admitting: Family Medicine

## 2021-11-10 ENCOUNTER — Other Ambulatory Visit: Payer: Self-pay

## 2021-11-10 ENCOUNTER — Ambulatory Visit (INDEPENDENT_AMBULATORY_CARE_PROVIDER_SITE_OTHER): Payer: Medicare HMO | Admitting: Family Medicine

## 2021-11-10 VITALS — BP 120/88 | HR 80 | Ht 61.0 in | Wt 130.0 lb

## 2021-11-10 DIAGNOSIS — E1129 Type 2 diabetes mellitus with other diabetic kidney complication: Secondary | ICD-10-CM | POA: Diagnosis not present

## 2021-11-10 DIAGNOSIS — E785 Hyperlipidemia, unspecified: Secondary | ICD-10-CM | POA: Diagnosis not present

## 2021-11-10 DIAGNOSIS — J301 Allergic rhinitis due to pollen: Secondary | ICD-10-CM

## 2021-11-10 DIAGNOSIS — K760 Fatty (change of) liver, not elsewhere classified: Secondary | ICD-10-CM | POA: Diagnosis not present

## 2021-11-10 DIAGNOSIS — K921 Melena: Secondary | ICD-10-CM

## 2021-11-10 DIAGNOSIS — R1011 Right upper quadrant pain: Secondary | ICD-10-CM

## 2021-11-10 DIAGNOSIS — E1159 Type 2 diabetes mellitus with other circulatory complications: Secondary | ICD-10-CM | POA: Diagnosis not present

## 2021-11-10 DIAGNOSIS — I152 Hypertension secondary to endocrine disorders: Secondary | ICD-10-CM | POA: Diagnosis not present

## 2021-11-10 DIAGNOSIS — R809 Proteinuria, unspecified: Secondary | ICD-10-CM | POA: Diagnosis not present

## 2021-11-10 DIAGNOSIS — E1169 Type 2 diabetes mellitus with other specified complication: Secondary | ICD-10-CM | POA: Diagnosis not present

## 2021-11-10 MED ORDER — PANTOPRAZOLE SODIUM 40 MG PO TBEC: 40.0000 mg | DELAYED_RELEASE_TABLET | Freq: Every day | ORAL | 3 refills | Status: DC

## 2021-11-10 NOTE — Progress Notes (Signed)
Date:  11/10/2021   Name:  Judy Bolton   DOB:  March 23, 1946   MRN:  [id number]   Chief Complaint: Abdominal Pain (RUQ pain x 1 week- better now after passing a "big turd with something in it" )  Abdominal Pain This is a recurrent problem. The current episode started more than 1 month ago. The onset quality is gradual. The problem occurs intermittently. The problem has been gradually improving. The pain is located in the RUQ. The pain is at a severity of 10/10. The pain is moderate. The quality of the pain is colicky. The abdominal pain radiates to the back. Associated symptoms include diarrhea, hematochezia, melena and nausea. Pertinent negatives include no anorexia, constipation, dysuria, fever, frequency, headaches, hematuria or myalgias. Associated symptoms comments: Also pepto bismol. The pain is relieved by Nothing. The treatment provided mild relief.   Lab Results  Component Value Date   NA 141 10/12/2021   K 4.6 10/12/2021   CO2 24 10/12/2021   GLUCOSE 109 (H) 10/12/2021   BUN 27 10/12/2021   CREATININE 1.27 (H) 10/12/2021   CALCIUM 10.0 10/12/2021   EGFR 45 (L) 10/12/2021   GFRNONAA 40 (L) 09/12/2020   Lab Results  Component Value Date   CHOL 179 10/12/2021   HDL 53 10/12/2021   LDLCALC 103 (H) 10/12/2021   TRIG 128 10/12/2021   CHOLHDL 2.8 01/24/2018   No results found for: TSH Lab Results  Component Value Date   HGBA1C 7.2 12/13/2020   Lab Results  Component Value Date   WBC 11.8 (H) 09/12/2020   HGB 13.4 09/12/2020   HCT 38.9 09/12/2020   MCV 85.7 09/12/2020   PLT 252 09/12/2020   Lab Results  Component Value Date   ALT 16 10/12/2021   AST 23 10/12/2021   ALKPHOS 104 10/12/2021   BILITOT 0.3 10/12/2021   No results found for: 25OHVITD2, 25OHVITD3, VD25OH   Review of Systems  Constitutional:  Negative for chills and fever.  HENT:  Negative for drooling, ear discharge, ear pain and sore throat.   Respiratory:  Negative for cough, shortness of  breath and wheezing.   Cardiovascular:  Negative for chest pain, palpitations and leg swelling.  Gastrointestinal:  Positive for abdominal pain, blood in stool, diarrhea, hematochezia, melena and nausea. Negative for abdominal distention, anorexia and constipation.  Endocrine: Negative for polydipsia.  Genitourinary:  Negative for dysuria, frequency, hematuria and urgency.  Musculoskeletal:  Negative for back pain, myalgias and neck pain.  Skin:  Negative for rash.  Allergic/Immunologic: Negative for environmental allergies.  Neurological:  Negative for dizziness and headaches.  Hematological:  Does not bruise/bleed easily.  Psychiatric/Behavioral:  Negative for suicidal ideas. The patient is not nervous/anxious.    Patient Active Problem List   Diagnosis Date Noted   Essential hypertension 08/30/2017   Gastroesophageal reflux disease 08/30/2017   Aortic atherosclerosis ([place]) 05/31/2017   PAD (peripheral artery disease) ([place]) 05/31/2017   Encounter for long-term (current) use of high-risk medication 12/28/2016   Seronegative rheumatoid arthritis ([place]) 12/28/2016   Bilateral hand pain 11/08/2016   Elevated C-reactive protein 11/08/2016   Swelling of joint of right hand 11/08/2016   Type 2 diabetes mellitus with microalbuminuria, without long-term current use of insulin ([place]) 07/06/2016   Acute anxiety 04/16/2016   Bronchitis 04/16/2016   Centrilobular emphysema ([place]) 04/16/2016   TIA (transient ischemic attack) 02/29/2016   Lumbar stenosis with neurogenic claudication 04/16/2015   Spondylosis of lumbar region without myelopathy or radiculopathy 04/16/2015  Hyperlipidemia 10/15/2014    Allergies  Allergen Reactions   Betadine [Povidone Iodine] Swelling   Codeine Itching   Latex Itching and Swelling    (urinary catheter)   Losartan     intolerance   Meloxicam Swelling   Penicillins Other (See Comments)    Paralysis  Has patient had a PCN reaction causing immediate rash,  facial/tongue/throat swelling, SOB or lightheadedness with hypotension: No Has patient had a PCN reaction causing severe rash involving mucus membranes or skin necrosis: No Has patient had a PCN reaction that required hospitalization Yes Has patient had a PCN reaction occurring within the last 10 years: No If all of the above answers are "NO", then may proceed with Cephalosporin use.   Valsartan     Past Surgical History:  Procedure Laterality Date   ABDOMINAL HYSTERECTOMY     ANTERIOR (CYSTOCELE) AND POSTERIOR REPAIR (RECTOCELE) WITH XENFORM GRAFT AND SACROSPINOUS FIXATION     APPENDECTOMY     BROW LIFT Bilateral 09/05/2018   Procedure: BLEPHAROPLASTY UPPER EYELID W/EXCESS SKIN;  Surgeon: Karle Starch, MD;  Location: [facility];  Service: Ophthalmology;  Laterality: Bilateral;   CTR Bilateral    LUMBAR LAMINECTOMY/DECOMPRESSION MICRODISCECTOMY N/A 07/27/2017   Procedure: LUMBAR LAMINECTOMY/DECOMPRESSION MICRODISCECTOMY 3 LEVELS-L3-S1;  Surgeon: Meade Maw, MD;  Location: [facility];  Service: Neurosurgery;  Laterality: N/A;   PTOSIS REPAIR Bilateral 09/05/2018   Procedure: PTOSIS REPAIR RESECT EX;  Surgeon: Karle Starch, MD;  Location: [facility];  Service: Ophthalmology;  Laterality: Bilateral;  Diabetic - oral meds sleep apnea Latexd allergy   ROTATOR CUFF REPAIR Right     Social History   Tobacco Use   Smoking status: Former    Packs/day: 1.50    Years: 28.00    Pack years: 42.00    Types: Cigarettes    Quit date: 08/14/2000    Years since quitting: 21.2   Smokeless tobacco: Never   Tobacco comments:    smoking cessatiuon materials not required  Vaping Use   Vaping Use: Never used  Substance Use Topics   Alcohol use: No   Drug use: No     Medication list has been reviewed and updated.  Current Meds  Medication Sig   albuterol (PROVENTIL HFA;VENTOLIN HFA) 108 (90 Base) MCG/ACT inhaler Inhale 2 puffs into the lungs every 6 (six) hours as needed  for wheezing or shortness of breath.   albuterol (PROVENTIL) (2.5 MG/3ML) 0.083% nebulizer solution Take 3 mLs (2.5 mg total) by nebulization every 6 (six) hours as needed for wheezing or shortness of breath.   amLODipine (NORVASC) 2.5 MG tablet TAKE 1 TABLET(2.5 MG) BY MOUTH DAILY   aspirin EC 81 MG tablet Take by mouth.   atorvastatin (LIPITOR) 40 MG tablet TAKE 1 TABLET(40 MG) BY MOUTH DAILY   celecoxib (CELEBREX) 200 MG capsule Take 200 mg by mouth daily. mundy   cholecalciferol (VITAMIN D3) 25 MCG (1000 UNIT) tablet Take 1,000 Units by mouth daily.   cloNIDine (CATAPRES) 0.1 MG tablet Take 1 tablet (0.1 mg total) by mouth 2 (two) times daily as needed. (Patient taking differently: Take 0.1 mg by mouth 2 (two) times daily as needed. Takes 1/2 tablet as needed twice a day)   docusate sodium (COLACE) 100 MG capsule Take 100 mg by mouth daily.   gabapentin (NEURONTIN) 100 MG capsule Take 2 capsules by mouth at bedtime. mundy   gemfibrozil (LOPID) 600 MG tablet TAKE 1 TABLET(600 MG) BY MOUTH TWICE DAILY   glipiZIDE (GLUCOTROL)  5 MG tablet Take 5 mg by mouth every morning.   hydrochlorothiazide (HYDRODIURIL) 25 MG tablet Take 1 tablet (25 mg total) by mouth daily.   ipratropium (ATROVENT) 0.02 % nebulizer solution Inhale into the lungs.   metFORMIN (GLUCOPHAGE) 500 MG tablet Take 500 mg by mouth 2 (two) times daily.   metoprolol tartrate (LOPRESSOR) 50 MG tablet TAKE 1 TABLET(50 MG) BY MOUTH TWICE DAILY   montelukast (SINGULAIR) 10 MG tablet Take 1 tablet (10 mg total) by mouth daily.   omeprazole (PRILOSEC) 20 MG capsule Take 20 mg by mouth daily. otc   vitamin B-12 (CYANOCOBALAMIN) 1000 MCG tablet Take 1,000 mcg by mouth daily.    PHQ 2/9 Scores 10/12/2021 03/09/2021 01/29/2021 07/31/2020  PHQ - 2 Score 0 0 0 0  PHQ- 9 Score 0 - 0 0    GAD 7 : Generalized Anxiety Score 10/12/2021 01/29/2021 07/31/2020 05/02/2020  Nervous, Anxious, on Edge 0 0 0 0  Control/stop worrying 0 0 0 0  Worry too much -  different things 0 0 0 0  Trouble relaxing 0 0 0 0  Restless 0 0 0 0  Easily annoyed or irritable 0 0 0 0  Afraid - awful might happen 0 0 0 0  Total GAD 7 Score 0 0 0 0  Anxiety Difficulty - - - -    BP Readings from Last 3 Encounters:  11/10/21 120/88  10/12/21 120/88  04/28/21 110/80    Physical Exam Vitals and nursing note reviewed.  Abdominal:     General: Bowel sounds are normal.     Palpations: Abdomen is soft. There is no hepatomegaly, splenomegaly, mass or pulsatile mass.     Tenderness: There is abdominal tenderness in the right upper quadrant.  Genitourinary:    Rectum: Normal. Guaiac result negative. No mass.    Wt Readings from Last 3 Encounters:  11/10/21 130 lb (59 kg)  10/12/21 132 lb (59.9 kg)  04/28/21 150 lb (68 kg)    BP 120/88   Pulse 80   Ht 5' 1"  (1.549 m)   Wt 130 lb (59 kg)   BMI 24.56 kg/m   Assessment and Plan:  1. Right upper quadrant pain new onset.  Similar to 2015.  Patient has sharp pain 10/10 which is currently stable. New onset.  Recurrent in nature.  Currently stable but still feels off and on colicky pain in the right upper quadrant radiating to the right upper back.  Patient went to the ER and the weight exceeded 10 hours and she decided to go home when she was feeling some better.  Went to the beach the following day which had a black diarrheal stool which may have been secondary to Pepto-Bismol or perhaps melena because there was some blood seen near the end of the bowel movement.  Patient has had nausea with no vomiting.  Exam is consistent with tenderness in the right upper quadrant and we will initiate evaluation with labs including hepatic panel and lipase and CBC as well as ultrasound of the right upper quadrant. - pantoprazole (PROTONIX) 40 MG tablet; Take 1 tablet (40 mg total) by mouth daily.  Dispense: 30 tablet; Refill: 3 - CBC with Differential/Platelet - Hepatic Function Panel (6) - Lipase - Ambulatory referral to  Gastroenterology  2. Hematochezia Patient has noted some blood at the end of a bowel movement while she was at the beach along with the possibility of melena.  We will obtain a CBC to evaluate for possible  decreased hematocrit hemoglobin. - CBC with Differential/Platelet - Ambulatory referral to Gastroenterology  3. Fatty liver Patient has a history of fatty liver as noted in 2015 - Ambulatory referral to Gastroenterology  4. Seasonal allergic rhinitis due to pollen New onset.  Stable.  Have been suggested that the patient obtain Mucinex for upper respiratory rhinorrhea and congestion.

## 2021-11-11 ENCOUNTER — Other Ambulatory Visit: Payer: Self-pay

## 2021-11-11 ENCOUNTER — Other Ambulatory Visit: Payer: Self-pay | Admitting: Gastroenterology

## 2021-11-11 DIAGNOSIS — R1011 Right upper quadrant pain: Secondary | ICD-10-CM

## 2021-11-11 LAB — CBC WITH DIFFERENTIAL/PLATELET
Basophils Absolute: 0.1 10*3/uL (ref 0.0–0.2)
Basos: 1 %
EOS (ABSOLUTE): 0 10*3/uL (ref 0.0–0.4)
Eos: 0 %
Hematocrit: 37.9 % (ref 34.0–46.6)
Hemoglobin: 12.5 g/dL (ref 11.1–15.9)
Immature Grans (Abs): 0 10*3/uL (ref 0.0–0.1)
Immature Granulocytes: 0 %
Lymphocytes Absolute: 2.6 10*3/uL (ref 0.7–3.1)
Lymphs: 29 %
MCH: 28.2 pg (ref 26.6–33.0)
MCHC: 33 g/dL (ref 31.5–35.7)
MCV: 85 fL (ref 79–97)
Monocytes Absolute: 1 10*3/uL — ABNORMAL HIGH (ref 0.1–0.9)
Monocytes: 11 %
Neutrophils Absolute: 5.2 10*3/uL (ref 1.4–7.0)
Neutrophils: 59 %
Platelets: 255 10*3/uL (ref 150–450)
RBC: 4.44 x10E6/uL (ref 3.77–5.28)
RDW: 13.5 % (ref 11.7–15.4)
WBC: 9 10*3/uL (ref 3.4–10.8)

## 2021-11-11 LAB — HEPATIC FUNCTION PANEL (6)
ALT: 11 IU/L (ref 0–32)
AST: 19 IU/L (ref 0–40)
Albumin: 4.6 g/dL (ref 3.7–4.7)
Alkaline Phosphatase: 83 IU/L (ref 44–121)
Bilirubin Total: <0.2 mg/dL (ref 0.0–1.2)
Bilirubin, Direct: 0.11 mg/dL (ref 0.00–0.40)

## 2021-11-11 LAB — LIPASE: Lipase: 78 U/L (ref 14–85)

## 2021-11-11 MED ORDER — PANTOPRAZOLE SODIUM 40 MG PO TBEC: 40.0000 mg | DELAYED_RELEASE_TABLET | Freq: Every day | ORAL | 3 refills | Status: DC

## 2021-11-15 ENCOUNTER — Other Ambulatory Visit: Payer: Self-pay | Admitting: Family Medicine

## 2021-11-15 DIAGNOSIS — E785 Hyperlipidemia, unspecified: Secondary | ICD-10-CM

## 2021-11-15 DIAGNOSIS — I10 Essential (primary) hypertension: Secondary | ICD-10-CM

## 2021-11-15 NOTE — Telephone Encounter (Signed)
Requested Prescriptions  Pending Prescriptions Disp Refills  . gemfibrozil (LOPID) 600 MG tablet [Pharmacy Med Name: GEMFIBROZIL 600MG TABLETS] 180 tablet 1    Sig: TAKE 1 TABLET(600 MG) BY MOUTH TWICE DAILY     Cardiovascular:  Antilipid - Fibric Acid Derivatives Failed - 11/15/2021  1:23 PM      Failed - LDL in normal range and within 360 days    LDL Chol Calc (NIH)  Date Value Ref Range Status  10/12/2021 103 (H) 0 - 99 mg/dL Final         Failed - Cr in normal range and within 180 days    Creatinine  Date Value Ref Range Status  02/29/2012 1.16 0.60 - 1.30 mg/dL Final   Creatinine, Ser  Date Value Ref Range Status  10/12/2021 1.27 (H) 0.57 - 1.00 mg/dL Final         Failed - eGFR in normal range and within 180 days    EGFR (African American)  Date Value Ref Range Status  02/29/2012 >60 >19m/min Final   GFR calc Af Amer  Date Value Ref Range Status  10/03/2019 52 (L) >60 mL/min Final    Comment:    [facility] [address], [place] [address]  EGFR ([place])  Date Value Ref Range Status  02/29/2012 50 (L) >621mmin Final    Comment:    eGFR values <602min/1.73 m2 may be an indication of chronic kidney disease (CKD). Calculated eGFR, using the MRDR Study equation, is useful in  patients with stable renal function. The eGFR calculation will not be reliable in acutely ill patients when serum creatinine is changing rapidly. It is not useful in patients on dialysis. The eGFR calculation may not be applicable to patients at the low and high extremes of body sizes, pregnant women, and vegetarians.    GFR, Estimated  Date Value Ref Range Status  09/12/2020 40 (L) >60 mL/min Final   eGFR  Date Value Ref Range Status  10/12/2021 45 (L) >59 mL/min/1.73 Final         Passed - Total Cholesterol in normal range and within 360 days    Cholesterol, Total  Date Value Ref Range Status  10/12/2021 179 100 - 199 mg/dL  Final         Passed - HDL in normal range and within 360 days    HDL  Date Value Ref Range Status  10/12/2021 53 >39 mg/dL Final         Passed - Triglycerides in normal range and within 360 days    Triglycerides  Date Value Ref Range Status  10/12/2021 128 0 - 149 mg/dL Final         Passed - ALT in normal range and within 180 days    ALT  Date Value Ref Range Status  11/10/2021 11 0 - 32 IU/L Final   SGPT (ALT)  Date Value Ref Range Status  02/29/2012 30 U/L Final    Comment:    12-78 NOTE: NEW REFERENCE RANGE 10/29/2011          Passed - AST in normal range and within 180 days    AST  Date Value Ref Range Status  11/10/2021 19 0 - 40 IU/L Final   SGOT(AST)  Date Value Ref Range Status  02/29/2012 27 15 - 37 4it/L Final         Passed - Valid encounter within last 12 months    Recent Outpatient  Visits          5 days ago Right upper quadrant pain   [facility] Juline Patch, MD   1 month ago Essential hypertension   [place], MD   6 months ago Essential hypertension   [place], MD   8 months ago Frequent urination   [facility] Juline Patch, MD   9 months ago Essential hypertension   [place], Deanna C, MD      Future Appointments            In 4 months Juline Patch, MD [facility], [place]           . atorvastatin (LIPITOR) 40 MG tablet [Pharmacy Med Name: ATORVASTATIN 40MG TABLETS] 90 tablet 1    Sig: TAKE 1 TABLET(40 MG) BY MOUTH DAILY     Cardiovascular:  Antilipid - Statins Failed - 11/15/2021  1:23 PM      Failed - LDL in normal range and within 360 days    LDL Chol Calc (NIH)  Date Value Ref Range Status  10/12/2021 103 (H) 0 - 99 mg/dL Final         Passed - Total Cholesterol in normal range and within 360 days    Cholesterol, Total  Date Value Ref Range Status  10/12/2021 179 100 - 199 mg/dL Final         Passed -  HDL in normal range and within 360 days    HDL  Date Value Ref Range Status  10/12/2021 53 >39 mg/dL Final         Passed - Triglycerides in normal range and within 360 days    Triglycerides  Date Value Ref Range Status  10/12/2021 128 0 - 149 mg/dL Final         Passed - Patient is not pregnant      Passed - Valid encounter within last 12 months    Recent Outpatient Visits          5 days ago Right upper quadrant pain   [facility] Juline Patch, MD   1 month ago Essential hypertension   [place], Deanna C, MD   6 months ago Essential hypertension   [facility] Juline Patch, MD   8 months ago Frequent urination   [facility] Juline Patch, MD   9 months ago Essential hypertension   [facility] Juline Patch, MD      Future Appointments            In 4 months Juline Patch, MD [facility], [facility]           . metoprolol tartrate (LOPRESSOR) 50 MG tablet [Pharmacy Med Name: METOPROLOL TARTRATE 50MG TABLETS] 180 tablet 1    Sig: TAKE 1 TABLET(50 MG) BY MOUTH TWICE DAILY     Cardiovascular:  Beta Blockers Passed - 11/15/2021  1:23 PM      Passed - Last BP in normal range    BP Readings from Last 1 Encounters:  11/10/21 120/88         Passed - Last Heart Rate in normal range    Pulse Readings from Last 1 Encounters:  11/10/21 80         Passed - Valid encounter within last 6 months    Recent Outpatient Visits  5 days ago Right upper quadrant pain   [facility] Juline Patch, MD   1 month ago Essential hypertension   [place], MD   6 months ago Essential hypertension   [place], MD   8 months ago Frequent urination   [facility] Juline Patch, MD   9 months ago Essential hypertension   [place], Deanna C, MD      Future Appointments            In 4 months Juline Patch,  MD [facility], [facility]

## 2021-11-17 ENCOUNTER — Ambulatory Visit: Payer: Medicare Other

## 2021-11-17 DIAGNOSIS — S46002A Unspecified injury of muscle(s) and tendon(s) of the rotator cuff of left shoulder, initial encounter: Secondary | ICD-10-CM | POA: Diagnosis not present

## 2021-11-17 DIAGNOSIS — M069 Rheumatoid arthritis, unspecified: Secondary | ICD-10-CM | POA: Diagnosis not present

## 2021-11-17 DIAGNOSIS — E119 Type 2 diabetes mellitus without complications: Secondary | ICD-10-CM | POA: Diagnosis not present

## 2021-11-17 DIAGNOSIS — J449 Chronic obstructive pulmonary disease, unspecified: Secondary | ICD-10-CM | POA: Diagnosis not present

## 2021-11-24 ENCOUNTER — Ambulatory Visit
Admission: RE | Admit: 2021-11-24 | Discharge: 2021-11-24 | Disposition: A | Discharge status: Home / Self Care | Payer: Medicare HMO | Source: Ambulatory Visit | Attending: Family Medicine | Admitting: Family Medicine

## 2021-11-24 ENCOUNTER — Other Ambulatory Visit: Payer: Self-pay

## 2021-11-24 DIAGNOSIS — R1011 Right upper quadrant pain: Secondary | ICD-10-CM | POA: Diagnosis not present

## 2021-11-25 DIAGNOSIS — K760 Fatty (change of) liver, not elsewhere classified: Secondary | ICD-10-CM | POA: Diagnosis not present

## 2021-12-03 DIAGNOSIS — H353131 Nonexudative age-related macular degeneration, bilateral, early dry stage: Secondary | ICD-10-CM | POA: Diagnosis not present

## 2021-12-03 DIAGNOSIS — H524 Presbyopia: Secondary | ICD-10-CM | POA: Diagnosis not present

## 2021-12-03 DIAGNOSIS — J449 Chronic obstructive pulmonary disease, unspecified: Secondary | ICD-10-CM | POA: Diagnosis not present

## 2021-12-03 LAB — HM DIABETES EYE EXAM

## 2021-12-09 ENCOUNTER — Other Ambulatory Visit: Payer: Self-pay | Admitting: Family Medicine

## 2021-12-09 DIAGNOSIS — I10 Essential (primary) hypertension: Secondary | ICD-10-CM

## 2021-12-10 NOTE — Telephone Encounter (Signed)
Lopressor refilled 11/15/2021 #180 with 1 refill. Should last until 05/2022. HCTZ refilled 10/12/2021 #90 with 1 refill. Should last until 04/2022. Requested Prescriptions  Pending Prescriptions Disp Refills   metoprolol tartrate (LOPRESSOR) 50 MG tablet [Pharmacy Med Name: METOPROLOL TARTRATE 50 MG Tablet] 180 tablet 1    Sig: TAKE 1 TABLET TWICE DAILY     Cardiovascular:  Beta Blockers Passed - 12/09/2021 10:15 AM      Passed - Last BP in normal range    BP Readings from Last 1 Encounters:  11/10/21 120/88         Passed - Last Heart Rate in normal range    Pulse Readings from Last 1 Encounters:  11/10/21 80         Passed - Valid encounter within last 6 months    Recent Outpatient Visits          1 month ago Right upper quadrant pain   [facility] Duanne Limerick, MD   1 month ago Essential hypertension   [facility] Duanne Limerick, MD   7 months ago Essential hypertension   [facility] Duanne Limerick, MD   9 months ago Frequent urination   [facility] Duanne Limerick, MD   10 months ago Essential hypertension   [facility] Duanne Limerick, MD      Future Appointments            In 4 months Duanne Limerick, MD [facility], [facility]            hydrochlorothiazide (HYDRODIURIL) 25 MG tablet [Pharmacy Med Name: HYDROCHLOROTHIAZIDE 25 MG Tablet] 90 tablet 1    Sig: TAKE 1 TABLET (25 MG TOTAL) BY MOUTH DAILY.     Cardiovascular: Diuretics - Thiazide Failed - 12/09/2021 10:15 AM      Failed - Cr in normal range and within 360 days    Creatinine  Date Value Ref Range Status  02/29/2012 1.16 0.60 - 1.30 mg/dL Final   Creatinine, Ser  Date Value Ref Range Status  10/12/2021 1.27 (H) 0.57 - 1.00 mg/dL Final         Passed - Ca in normal range and within 360 days    Calcium  Date Value Ref Range Status  10/12/2021 10.0 8.7 - 10.3 mg/dL Final   Calcium, Total  Date Value Ref Range Status  02/29/2012 9.2 8.5 -  10.1 mg/dL Final         Passed - K in normal range and within 360 days    Potassium  Date Value Ref Range Status  10/12/2021 4.6 3.5 - 5.2 mmol/L Final  08/08/2014 4.7 3.5 - 5.1 mmol/L Final         Passed - Na in normal range and within 360 days    Sodium  Date Value Ref Range Status  10/12/2021 141 134 - 144 mmol/L Final  02/29/2012 142 136 - 145 mmol/L Final         Passed - Last BP in normal range    BP Readings from Last 1 Encounters:  11/10/21 120/88         Passed - Valid encounter within last 6 months    Recent Outpatient Visits          1 month ago Right upper quadrant pain   [facility] Duanne Limerick, MD   1 month ago Essential hypertension   [facility] Duanne Limerick, MD  7 months ago Essential hypertension   [facility] Duanne Limerick, MD   9 months ago Frequent urination   [facility] Duanne Limerick, MD   10 months ago Essential hypertension   [facility] Duanne Limerick, MD      Future Appointments            In 4 months Duanne Limerick, MD [facility], [facility]

## 2021-12-18 ENCOUNTER — Other Ambulatory Visit: Payer: Self-pay | Admitting: Family Medicine

## 2021-12-18 DIAGNOSIS — R1011 Right upper quadrant pain: Secondary | ICD-10-CM

## 2021-12-18 DIAGNOSIS — R059 Cough, unspecified: Secondary | ICD-10-CM | POA: Diagnosis not present

## 2021-12-18 DIAGNOSIS — R0989 Other specified symptoms and signs involving the circulatory and respiratory systems: Secondary | ICD-10-CM | POA: Diagnosis not present

## 2021-12-18 NOTE — Telephone Encounter (Signed)
Requested Prescriptions  Pending Prescriptions Disp Refills   pantoprazole (PROTONIX) 40 MG tablet [Pharmacy Med Name: PANTOPRAZOLE SOD DR 40 MG TAB] 90 tablet 1    Sig: TAKE 1 TABLET BY MOUTH EVERY DAY     Gastroenterology: Proton Pump Inhibitors Passed - 12/18/2021  5:13 PM      Passed - Valid encounter within last 12 months    Recent Outpatient Visits          1 month ago Right upper quadrant pain   [facility] Duanne Limerick, MD   2 months ago Essential hypertension   [facility] Duanne Limerick, MD   7 months ago Essential hypertension   [facility] Duanne Limerick, MD   9 months ago Frequent urination   [facility] Duanne Limerick, MD   10 months ago Essential hypertension   [facility] Duanne Limerick, MD      Future Appointments            In 3 months Duanne Limerick, MD [facility], [facility]

## 2021-12-23 ENCOUNTER — Encounter: Payer: Self-pay | Admitting: Ophthalmology

## 2021-12-23 ENCOUNTER — Other Ambulatory Visit: Payer: Self-pay | Admitting: Family Medicine

## 2021-12-23 DIAGNOSIS — I10 Essential (primary) hypertension: Secondary | ICD-10-CM

## 2021-12-23 NOTE — Telephone Encounter (Signed)
Sending to mail order pharmacy  Requested Prescriptions  Pending Prescriptions Disp Refills   hydrochlorothiazide (HYDRODIURIL) 25 MG tablet [Pharmacy Med Name: HYDROCHLOROTHIAZIDE 25 MG Tablet] 90 tablet 1    Sig: TAKE 1 TABLET (25 MG TOTAL) BY MOUTH DAILY.     Cardiovascular: Diuretics - Thiazide Failed - 12/23/2021 11:52 AM      Failed - Cr in normal range and within 360 days    Creatinine  Date Value Ref Range Status  02/29/2012 1.16 0.60 - 1.30 mg/dL Final   Creatinine, Ser  Date Value Ref Range Status  10/12/2021 1.27 (H) 0.57 - 1.00 mg/dL Final         Passed - Ca in normal range and within 360 days    Calcium  Date Value Ref Range Status  10/12/2021 10.0 8.7 - 10.3 mg/dL Final   Calcium, Total  Date Value Ref Range Status  02/29/2012 9.2 8.5 - 10.1 mg/dL Final         Passed - K in normal range and within 360 days    Potassium  Date Value Ref Range Status  10/12/2021 4.6 3.5 - 5.2 mmol/L Final  08/08/2014 4.7 3.5 - 5.1 mmol/L Final         Passed - Na in normal range and within 360 days    Sodium  Date Value Ref Range Status  10/12/2021 141 134 - 144 mmol/L Final  02/29/2012 142 136 - 145 mmol/L Final         Passed - Last BP in normal range    BP Readings from Last 1 Encounters:  11/10/21 120/88         Passed - Valid encounter within last 6 months    Recent Outpatient Visits          1 month ago Right upper quadrant pain   [facility] Duanne Limerick, MD   2 months ago Essential hypertension   [facility] Duanne Limerick, MD   7 months ago Essential hypertension   [facility] Duanne Limerick, MD   9 months ago Frequent urination   [facility] Duanne Limerick, MD   10 months ago Essential hypertension   [facility] Duanne Limerick, MD      Future Appointments            In 3 months Duanne Limerick, MD [facility]            metoprolol tartrate (LOPRESSOR) 50 MG tablet  [Pharmacy Med Name: METOPROLOL TARTRATE 50 MG Tablet] 180 tablet 1    Sig: TAKE 1 TABLET TWICE DAILY     Cardiovascular:  Beta Blockers Passed - 12/23/2021 11:52 AM      Passed - Last BP in normal range    BP Readings from Last 1 Encounters:  11/10/21 120/88         Passed - Last Heart Rate in normal range    Pulse Readings from Last 1 Encounters:  11/10/21 80         Passed - Valid encounter within last 6 months    Recent Outpatient Visits          1 month ago Right upper quadrant pain   [facility] Duanne Limerick, MD   2 months ago Essential hypertension   [facility] Duanne Limerick, MD   7 months ago Essential hypertension   [facility] Duanne Limerick, MD  9 months ago Frequent urination   [facility] Duanne Limerick, MD   10 months ago Essential hypertension   [facility] Duanne Limerick, MD      Future Appointments            In 3 months Duanne Limerick, MD [facility], [facility]

## 2021-12-24 DIAGNOSIS — H2512 Age-related nuclear cataract, left eye: Secondary | ICD-10-CM | POA: Diagnosis not present

## 2022-01-04 NOTE — Discharge Instructions (Signed)

## 2022-01-06 ENCOUNTER — Ambulatory Visit: Payer: Medicare HMO | Admitting: Anesthesiology

## 2022-01-06 ENCOUNTER — Other Ambulatory Visit: Payer: Self-pay

## 2022-01-06 ENCOUNTER — Encounter
Admission: RE | Disposition: A | Discharge status: Home / Self Care | Payer: Self-pay | Source: Home / Self Care | Attending: Ophthalmology

## 2022-01-06 ENCOUNTER — Ambulatory Visit
Admission: RE | Admit: 2022-01-06 | Discharge: 2022-01-06 | Disposition: A | Discharge status: Home / Self Care | Payer: Medicare HMO | Attending: Ophthalmology | Admitting: Ophthalmology

## 2022-01-06 ENCOUNTER — Encounter: Payer: Self-pay | Admitting: Ophthalmology

## 2022-01-06 DIAGNOSIS — G4730 Sleep apnea, unspecified: Secondary | ICD-10-CM | POA: Diagnosis not present

## 2022-01-06 DIAGNOSIS — H25812 Combined forms of age-related cataract, left eye: Secondary | ICD-10-CM | POA: Diagnosis not present

## 2022-01-06 DIAGNOSIS — K219 Gastro-esophageal reflux disease without esophagitis: Secondary | ICD-10-CM | POA: Diagnosis not present

## 2022-01-06 DIAGNOSIS — I10 Essential (primary) hypertension: Secondary | ICD-10-CM | POA: Diagnosis not present

## 2022-01-06 DIAGNOSIS — E1136 Type 2 diabetes mellitus with diabetic cataract: Secondary | ICD-10-CM | POA: Diagnosis not present

## 2022-01-06 DIAGNOSIS — J449 Chronic obstructive pulmonary disease, unspecified: Secondary | ICD-10-CM | POA: Diagnosis not present

## 2022-01-06 DIAGNOSIS — Z8673 Personal history of transient ischemic attack (TIA), and cerebral infarction without residual deficits: Secondary | ICD-10-CM | POA: Insufficient documentation

## 2022-01-06 DIAGNOSIS — E1151 Type 2 diabetes mellitus with diabetic peripheral angiopathy without gangrene: Secondary | ICD-10-CM | POA: Insufficient documentation

## 2022-01-06 DIAGNOSIS — H2512 Age-related nuclear cataract, left eye: Secondary | ICD-10-CM | POA: Insufficient documentation

## 2022-01-06 HISTORY — PX: CATARACT EXTRACTION W/PHACO: SHX586

## 2022-01-06 LAB — GLUCOSE, CAPILLARY
Glucose-Capillary: 102 mg/dL — ABNORMAL HIGH (ref 70–99)
Glucose-Capillary: 106 mg/dL — ABNORMAL HIGH (ref 70–99)

## 2022-01-06 SURGERY — PHACOEMULSIFICATION, CATARACT, WITH IOL INSERTION
Anesthesia: Monitor Anesthesia Care | Site: Eye | Laterality: Left

## 2022-01-06 MED ORDER — MIDAZOLAM HCL 2 MG/2ML IJ SOLN
INTRAMUSCULAR | Status: DC | PRN
Start: 2022-01-06 — End: 2022-01-06
  Administered 2022-01-06: 2 mg via INTRAVENOUS

## 2022-01-06 MED ORDER — SIGHTPATH DOSE#1 BSS IO SOLN
INTRAOCULAR | Status: DC | PRN
  Administered 2022-01-06: 15 mL via INTRAOCULAR

## 2022-01-06 MED ORDER — BRIMONIDINE TARTRATE 0.2 % OP SOLN
OPHTHALMIC | Status: DC | PRN
  Administered 2022-01-06: 1 [drp] via OPHTHALMIC

## 2022-01-06 MED ORDER — MOXIFLOXACIN HCL 0.5 % OP SOLN
OPHTHALMIC | Status: DC | PRN
  Administered 2022-01-06: 0.2 mL via OPHTHALMIC

## 2022-01-06 MED ORDER — FENTANYL CITRATE (PF) 100 MCG/2ML IJ SOLN
INTRAMUSCULAR | Status: DC | PRN
  Administered 2022-01-06: 50 ug via INTRAVENOUS

## 2022-01-06 MED ORDER — ACETAMINOPHEN 160 MG/5ML PO SOLN: 325.0000 mg | ORAL | Status: DC | PRN

## 2022-01-06 MED ORDER — TIMOLOL MALEATE 0.5 % OP SOLN
OPHTHALMIC | Status: DC | PRN
  Administered 2022-01-06: 1 [drp] via OPHTHALMIC

## 2022-01-06 MED ORDER — SIGHTPATH DOSE#1 NA HYALUR & NA CHOND-NA HYALUR IO KIT
PACK | INTRAOCULAR | Status: DC | PRN
  Administered 2022-01-06: 1 via OPHTHALMIC

## 2022-01-06 MED ORDER — ARMC OPHTHALMIC DILATING DROPS
1.0000 "application " | OPHTHALMIC | Status: DC | PRN
  Administered 2022-01-06 (×3): 1 via OPHTHALMIC

## 2022-01-06 MED ORDER — TETRACAINE HCL 0.5 % OP SOLN
1.0000 [drp] | OPHTHALMIC | Status: DC | PRN
  Administered 2022-01-06 (×3): 1 [drp] via OPHTHALMIC

## 2022-01-06 MED ORDER — SIGHTPATH DOSE#1 BSS IO SOLN
INTRAOCULAR | Status: DC | PRN
  Administered 2022-01-06: 72 mL via OPHTHALMIC

## 2022-01-06 MED ORDER — ONDANSETRON HCL 4 MG/2ML IJ SOLN: 4.0000 mg | Freq: Once | INTRAMUSCULAR | Status: DC | PRN

## 2022-01-06 MED ORDER — ACETAMINOPHEN 325 MG PO TABS: 325.0000 mg | ORAL_TABLET | ORAL | Status: DC | PRN

## 2022-01-06 MED ORDER — SIGHTPATH DOSE#1 BSS IO SOLN
INTRAOCULAR | Status: DC | PRN
  Administered 2022-01-06: 2 mL

## 2022-01-06 SURGICAL SUPPLY — 13 items
CANNULA ANT/CHMB 27G (MISCELLANEOUS) IMPLANT
CANNULA ANT/CHMB 27GA (MISCELLANEOUS) IMPLANT
CATARACT SUITE SIGHTPATH (MISCELLANEOUS) ×2 IMPLANT
FEE CATARACT SUITE SIGHTPATH (MISCELLANEOUS) ×1 IMPLANT
GLOVE SRG 8 PF TXTR STRL LF DI (GLOVE) ×1 IMPLANT
GLOVE SURG GAMMEX PI TX LF 7.5 (GLOVE) ×1 IMPLANT
GLOVE SURG UNDER POLY LF SZ8 (GLOVE) ×2
LENS IOL TECNIS EYHANCE 21.5 (Intraocular Lens) ×1 IMPLANT
NDL FILTER BLUNT 18X1 1/2 (NEEDLE) ×1 IMPLANT
NEEDLE FILTER BLUNT 18X 1/2SAF (NEEDLE) ×1
NEEDLE FILTER BLUNT 18X1 1/2 (NEEDLE) ×1 IMPLANT
SYR 3ML LL SCALE MARK (SYRINGE) ×2 IMPLANT
WATER STERILE IRR 250ML POUR (IV SOLUTION) ×2 IMPLANT

## 2022-01-06 NOTE — Anesthesia Procedure Notes (Signed)
Procedure Name: MAC Date/Time: 01/06/2022 9:46 AM Performed by: Jeannene Patella, CRNA Pre-anesthesia Checklist: Patient identified, Emergency Drugs available, Suction available, Timeout performed and Patient being monitored Patient Re-evaluated:Patient Re-evaluated prior to induction Oxygen Delivery Method: Nasal cannula Placement Confirmation: positive ETCO2

## 2022-01-06 NOTE — Anesthesia Preprocedure Evaluation (Signed)
Anesthesia Evaluation  Patient identified by MRN, date of birth, ID band Patient awake    Reviewed: Allergy & Precautions, NPO status , Patient's Chart, lab work & pertinent test results  History of Anesthesia Complications Negative for: history of anesthetic complications  Airway Mallampati: III  TM Distance: >3 FB Neck ROM: Full    Dental  (+)    Pulmonary asthma , sleep apnea , COPD, former smoker,    Pulmonary exam normal breath sounds clear to auscultation       Cardiovascular Exercise Tolerance: Good hypertension, + Peripheral Vascular Disease  Normal cardiovascular exam Rhythm:Regular Rate:Normal     Neuro/Psych Seizures - (x1, not on medication),  [place] (2017, no deficits)    GI/Hepatic GERD  ,  Endo/Other  diabetes  Renal/GU      Musculoskeletal  (+) Arthritis ,   Abdominal Normal abdominal exam  (+) - obese,  Abdomen: soft.    Peds  Hematology negative hematology ROS (+)   Anesthesia Other Findings   Reproductive/Obstetrics                             Anesthesia Physical  Anesthesia Plan  ASA: III  Anesthesia Plan: MAC   Post-op Pain Management:    Induction: Intravenous  PONV Risk Score and Plan: 2 and TIVA and Midazolam  Airway Management Planned: Natural Airway and Nasal Cannula  Additional Equipment:   Intra-op Plan:   Post-operative Plan:   Informed Consent: I have reviewed the patients History and Physical, chart, labs and discussed the procedure including the risks, benefits and alternatives for the proposed anesthesia with the patient or authorized representative who has indicated his/her understanding and acceptance.     Dental advisory given  Plan Discussed with: CRNA and Anesthesiologist  Anesthesia Plan Comments:         Anesthesia Quick Evaluation  Patient Active Problem List   Diagnosis Date Noted    Essential hypertension 08/30/2017   Gastroesophageal reflux disease 08/30/2017   Aortic atherosclerosis ([place]) 05/31/2017   PAD (peripheral artery disease) ([place]) 05/31/2017   Encounter for long-term (current) use of high-risk medication 12/28/2016   Seronegative rheumatoid arthritis ([place]) 12/28/2016   Bilateral hand pain 11/08/2016   Elevated C-reactive protein 11/08/2016   Swelling of joint of right hand 11/08/2016   Type 2 diabetes mellitus with microalbuminuria, without long-term current use of insulin ([place]) 07/06/2016   Acute anxiety 04/16/2016   Bronchitis 04/16/2016   Centrilobular emphysema ([place]) 04/16/2016   TIA (transient ischemic attack) 02/29/2016   Lumbar stenosis with neurogenic claudication 04/16/2015   Spondylosis of lumbar region without myelopathy or radiculopathy 04/16/2015   Hyperlipidemia 10/15/2014   CBC Latest Ref Rng & Units 11/10/2021 09/12/2020 05/31/2019  WBC 3.4 - 10.8 x10E3/uL 9.0 11.8(H) 8.2  Hemoglobin 11.1 - 15.9 g/dL 12.5 13.4 14.2  Hematocrit 34.0 - 46.6 % 37.9 38.9 42.0  Platelets 150 - 450 x10E3/uL 255 252 235   BMP Latest Ref Rng & Units 10/12/2021 09/12/2020 10/03/2019  Glucose 70 - 99 mg/dL 109(H) 204(H) -  BUN 8 - 27 mg/dL 27 37(H) -  Creatinine 0.57 - 1.00 mg/dL 1.27(H) 1.32(H) 1.22(H)  BUN/Creat Ratio 12 - 28 21 - -  Sodium 134 - 144 mmol/L 141 135 -  Potassium 3.5 - 5.2 mmol/L 4.6 3.9 -  Chloride 96 - 106 mmol/L 102 99 -  CO2 20 - 29 mmol/L 24 26 -  Calcium 8.7 - 10.3  mg/dL 10.0 9.9 -    Risks and benefits of anesthesia discussed at length, patient or surrogate demonstrates understanding. Appropriately NPO. Plan to proceed with anesthesia.  Champ Mungo, MD 01/06/22

## 2022-01-06 NOTE — H&P (Signed)
[facility]   Primary Care Physician:  Juline Patch, MD Ophthalmologist: Dr. Leandrew Koyanagi  Pre-Procedure History & Physical: HPI:  Judy Bolton is a 76 y.o. female here for ophthalmic surgery.   Past Medical History:  Diagnosis Date   Acid reflux    Arthritis    Asthma    Back pain    Back pain    COPD (chronic obstructive pulmonary disease) ([place])    Diabetes mellitus without complication ([place])    Hyperlipidemia    Hypertension    Seizure ([place])    x1 - over 30 yrs ago   Sleep apnea    No CPAP   Stroke [facility])    March of 2017 vocabulary    Past Surgical History:  Procedure Laterality Date   ABDOMINAL HYSTERECTOMY     ANTERIOR (CYSTOCELE) AND POSTERIOR REPAIR (RECTOCELE) WITH XENFORM GRAFT AND SACROSPINOUS FIXATION     APPENDECTOMY     BROW LIFT Bilateral 09/05/2018   Procedure: BLEPHAROPLASTY UPPER EYELID W/EXCESS SKIN;  Surgeon: Karle Starch, MD;  Location: [place];  Service: Ophthalmology;  Laterality: Bilateral;   CTR Bilateral    LUMBAR LAMINECTOMY/DECOMPRESSION MICRODISCECTOMY N/A 07/27/2017   Procedure: LUMBAR LAMINECTOMY/DECOMPRESSION MICRODISCECTOMY 3 LEVELS-L3-S1;  Surgeon: Meade Maw, MD;  Location: [facility];  Service: Neurosurgery;  Laterality: N/A;   PTOSIS REPAIR Bilateral 09/05/2018   Procedure: PTOSIS REPAIR RESECT EX;  Surgeon: Karle Starch, MD;  Location: [place];  Service: Ophthalmology;  Laterality: Bilateral;  Diabetic - oral meds sleep apnea Latexd allergy   ROTATOR CUFF REPAIR Right     Prior to Admission medications   Medication Sig Start Date End Date Taking? Authorizing Provider  albuterol (PROVENTIL HFA;VENTOLIN HFA) 108 (90 Base) MCG/ACT inhaler Inhale 2 puffs into the lungs every 6 (six) hours as needed for wheezing or shortness of breath.   Yes [provider]  albuterol (PROVENTIL) (2.5 MG/3ML) 0.083% nebulizer solution Take 3 mLs (2.5 mg total) by nebulization every 6 (six) hours  as needed for wheezing or shortness of breath. 07/10/18  Yes Otilio Miu C, MD  amLODipine (NORVASC) 2.5 MG tablet TAKE 1 TABLET(2.5 MG) BY MOUTH DAILY 10/12/21  Yes Juline Patch, MD  atorvastatin (LIPITOR) 40 MG tablet TAKE 1 TABLET(40 MG) BY MOUTH DAILY 11/15/21  Yes Juline Patch, MD  celecoxib (CELEBREX) 200 MG capsule Take 200 mg by mouth daily. mundy 11/20/20  Yes [provider]  cholecalciferol (VITAMIN D3) 25 MCG (1000 UNIT) tablet Take 1,000 Units by mouth daily.   Yes [provider]  Dulaglutide (TRULICITY) 1.5 0000000 SOPN Inject into the skin every 14 (fourteen) days.   Yes [provider]  gabapentin (NEURONTIN) 100 MG capsule Take 2 capsules by mouth at bedtime. mundy 12/10/19 12/23/21 Yes [provider]  gemfibrozil (LOPID) 600 MG tablet TAKE 1 TABLET(600 MG) BY MOUTH TWICE DAILY 11/15/21  Yes Juline Patch, MD  hydrochlorothiazide (HYDRODIURIL) 25 MG tablet TAKE 1 TABLET (25 MG TOTAL) BY MOUTH DAILY. 12/23/21  Yes Otilio Miu C, MD  ipratropium (ATROVENT) 0.02 % nebulizer solution Inhale into the lungs. 03/29/19  Yes [provider]  metFORMIN (GLUCOPHAGE) 500 MG tablet Take 500 mg by mouth 2 (two) times daily. 01/12/20  Yes [provider]  metoprolol tartrate (LOPRESSOR) 50 MG tablet TAKE 1 TABLET TWICE DAILY 12/23/21  Yes Juline Patch, MD  montelukast (SINGULAIR) 10 MG tablet Take 1 tablet (10 mg total) by mouth daily. 10/12/21  Yes  Juline Patch, MD  pantoprazole (PROTONIX) 40 MG tablet TAKE 1 TABLET BY MOUTH EVERY DAY 12/18/21  Yes Juline Patch, MD  vitamin B-12 (CYANOCOBALAMIN) 1000 MCG tablet Take 1,000 mcg by mouth daily.   Yes [provider]  aspirin EC 81 MG tablet Take by mouth. Patient not taking: Reported on 12/23/2021    [provider]  cloNIDine (CATAPRES) 0.1 MG tablet Take 1 tablet (0.1 mg total) by mouth 2 (two) times daily as needed. Patient not taking: Reported on 12/23/2021 09/13/20    Paulette Blanch, MD  docusate sodium (COLACE) 100 MG capsule Take 100 mg by mouth daily. Patient not taking: Reported on 12/23/2021    [provider]  glipiZIDE (GLUCOTROL) 5 MG tablet Take 5 mg by mouth every morning. Patient not taking: Reported on 12/23/2021 01/16/20   [provider]  omeprazole (PRILOSEC) 20 MG capsule Take 20 mg by mouth daily. otc Patient not taking: Reported on 12/23/2021    [provider]    Allergies as of 12/04/2021 - Review Complete 11/10/2021  Allergen Reaction Noted   Betadine [povidone iodine] Swelling 08/29/2018   Codeine Itching 04/21/2015   Latex Itching and Swelling 02/29/2016   Losartan  02/02/2021   Meloxicam Swelling 06/27/2017   Penicillins Other (See Comments) 04/21/2015   Valsartan  02/02/2021    Family History  Problem Relation Age of Onset   Heart attack Mother    Prostate cancer Father    Brain cancer Father    Heart attack Maternal Grandmother    Heart attack Maternal Grandfather    Breast cancer Paternal Grandmother     Social History   Socioeconomic History   Marital status: Widowed    Spouse name: Not on file   Number of children: 1   Years of education: some college   Highest education level: 12th grade  Occupational History   Occupation: Retired  Tobacco Use   Smoking status: Former    Packs/day: 1.50    Years: 28.00    Pack years: 42.00    Types: Cigarettes    Quit date: 08/14/2000    Years since quitting: 21.4   Smokeless tobacco: Never   Tobacco comments:    smoking cessatiuon materials not required  Vaping Use   Vaping Use: Never used  Substance and Sexual Activity   Alcohol use: No   Drug use: No   Sexual activity: Not Currently  Other Topics Concern   Not on file  Social History Narrative   Pt lives alone   Social Determinants of Health   Financial Resource Strain: Low Risk    Difficulty of Paying Living Expenses: Not hard at all  Food Insecurity: No Food Insecurity    Worried About Charity fundraiser in the Last Year: Never true   [place] in the Last Year: Never true  Transportation Needs: No Transportation Needs   Lack of Transportation (Medical): No   Lack of Transportation (Non-Medical): No  Physical Activity: Inactive   Days of Exercise per Week: 0 days   Minutes of Exercise per Session: 0 min  Stress: No Stress Concern Present   Feeling of Stress : Not at all  Social Connections: Moderately Integrated   Frequency of Communication with Friends and Family: More than three times a week   Frequency of Social Gatherings with Friends and Family: More than three times a week   Attends Religious Services: More than 4 times per year   Active  Member of Clubs or Organizations: Yes   Attends Music therapist: More than 4 times per year   Marital Status: Widowed  Human resources officer Violence: Not At Risk   Fear of Current or Ex-Partner: No   Emotionally Abused: No   Physically Abused: No   Sexually Abused: No    Review of Systems: See HPI, otherwise negative ROS  Physical Exam: BP (!) 170/89    Pulse 73    Temp 98 F (36.7 C) (Temporal)    Ht 5\' 1"  (1.549 m)    Wt 60.5 kg    SpO2 98%    BMI 25.21 kg/m  General:   Alert,  pleasant and cooperative in NAD Head:  Normocephalic and atraumatic. Lungs:  Clear to auscultation.    Heart:  Regular rate and rhythm.   Impression/Plan: MONTE HINDMAN is here for ophthalmic surgery.  Risks, benefits, limitations, and alternatives regarding ophthalmic surgery have been reviewed with the patient.  Questions have been answered.  All parties agreeable.   Leandrew Koyanagi, MD  01/06/2022, 9:10 AM

## 2022-01-06 NOTE — Anesthesia Postprocedure Evaluation (Signed)
Anesthesia Post Note  Patient: Judy Bolton  Procedure(s) Performed: CATARACT EXTRACTION PHACO AND INTRAOCULAR LENS PLACEMENT (IOC) LEFT DIABETIC 8.09 01:07.3 (Left: Eye)     Patient location during evaluation: PACU Anesthesia Type: MAC Level of consciousness: awake and alert Pain management: pain level controlled Vital Signs Assessment: post-procedure vital signs reviewed and stable Respiratory status: spontaneous breathing, nonlabored ventilation, respiratory function stable and patient connected to nasal cannula oxygen Cardiovascular status: stable and blood pressure returned to baseline Postop Assessment: no apparent nausea or vomiting Anesthetic complications: no   No notable events documented.  Sinda Du

## 2022-01-06 NOTE — Op Note (Signed)
OPERATIVE NOTE  Judy Bolton [id number] 01/06/2022   PREOPERATIVE DIAGNOSIS:  Nuclear sclerotic cataract left eye. H25.12   POSTOPERATIVE DIAGNOSIS:    Nuclear sclerotic cataract left eye.     PROCEDURE:  Phacoemusification with posterior chamber intraocular lens placement of the left eye  Ultrasound time: Procedure(s): CATARACT EXTRACTION PHACO AND INTRAOCULAR LENS PLACEMENT (IOC) LEFT DIABETIC 8.09 01:07.3 (Left)  LENS:   Implant Name Type Inv. Item Serial No. Manufacturer Lot No. LRB No. Used Action  LENS IOL TECNIS EYHANCE 21.5 - [id number] Intraocular Lens LENS IOL TECNIS EYHANCE 21.5 [id number] [employer]  Left 1 Implanted      SURGEON:  Deirdre Evenerhadwick R. Coreon Simkins, MD   ANESTHESIA:  Topical with tetracaine drops and 2% Xylocaine jelly, augmented with 1% preservative-free intracameral lidocaine.    COMPLICATIONS:  None.   DESCRIPTION OF PROCEDURE:  The patient was identified in the holding room and transported to the operating room and placed in the supine position under the operating microscope.  The left eye was identified as the operative eye and it was prepped and draped in the usual sterile ophthalmic fashion.   A 1 millimeter clear-corneal paracentesis was made at the 1:30 position.  0.5 ml of preservative-free 1% lidocaine was injected into the anterior chamber.  The anterior chamber was filled with Viscoat viscoelastic.  A 2.4 millimeter keratome was used to make a near-clear corneal incision at the 10:30 position.  .  A curvilinear capsulorrhexis was made with a cystotome and capsulorrhexis forceps.  Balanced salt solution was used to hydrodissect and hydrodelineate the nucleus.   Phacoemulsification was then used in stop and chop fashion to remove the lens nucleus and epinucleus.  The remaining cortex was then removed using the irrigation and aspiration handpiece. Provisc was then placed into the capsular bag to distend it for lens placement.  A lens was then injected into  the capsular bag.  The remaining viscoelastic was aspirated.   Wounds were hydrated with balanced salt solution.  The anterior chamber was inflated to a physiologic pressure with balanced salt solution.  No wound leaks were noted. Vigamox 0.2 ml of a 1mg  per ml solution was injected into the anterior chamber for a dose of 0.2 mg of intracameral antibiotic at the completion of the case.   Timolol and Brimonidine drops were applied to the eye.  The patient was taken to the recovery room in stable condition without complications of anesthesia or surgery.  Alexi Geibel 01/06/2022, 10:03 AM

## 2022-01-06 NOTE — Transfer of Care (Signed)
Immediate Anesthesia Transfer of Care Note  Patient: Judy Bolton  Procedure(s) Performed: CATARACT EXTRACTION PHACO AND INTRAOCULAR LENS PLACEMENT (IOC) LEFT DIABETIC 8.09 01:07.3 (Left: Eye)  Patient Location: PACU  Anesthesia Type: MAC  Level of Consciousness: awake, alert  and patient cooperative  Airway and Oxygen Therapy: Patient Spontanous Breathing and Patient connected to supplemental oxygen  Post-op Assessment: Post-op Vital signs reviewed, Patient's Cardiovascular Status Stable, Respiratory Function Stable, Patent Airway and No signs of Nausea or vomiting  Post-op Vital Signs: Reviewed and stable  Complications: No notable events documented.

## 2022-01-07 ENCOUNTER — Encounter: Payer: Self-pay | Admitting: Ophthalmology

## 2022-01-12 DIAGNOSIS — H2511 Age-related nuclear cataract, right eye: Secondary | ICD-10-CM | POA: Diagnosis not present

## 2022-01-14 DIAGNOSIS — J310 Chronic rhinitis: Secondary | ICD-10-CM | POA: Diagnosis not present

## 2022-01-14 DIAGNOSIS — R0609 Other forms of dyspnea: Secondary | ICD-10-CM | POA: Diagnosis not present

## 2022-01-14 DIAGNOSIS — J449 Chronic obstructive pulmonary disease, unspecified: Secondary | ICD-10-CM | POA: Diagnosis not present

## 2022-01-18 NOTE — Discharge Instructions (Signed)

## 2022-01-20 ENCOUNTER — Ambulatory Visit: Payer: Medicare HMO | Admitting: Anesthesiology

## 2022-01-20 ENCOUNTER — Encounter: Payer: Self-pay | Admitting: Ophthalmology

## 2022-01-20 ENCOUNTER — Other Ambulatory Visit: Payer: Self-pay

## 2022-01-20 ENCOUNTER — Ambulatory Visit
Admission: RE | Admit: 2022-01-20 | Discharge: 2022-01-20 | Disposition: A | Discharge status: Home / Self Care | Payer: Medicare HMO | Attending: Ophthalmology | Admitting: Ophthalmology

## 2022-01-20 ENCOUNTER — Encounter
Admission: RE | Disposition: A | Discharge status: Home / Self Care | Payer: Self-pay | Source: Home / Self Care | Attending: Ophthalmology

## 2022-01-20 DIAGNOSIS — J449 Chronic obstructive pulmonary disease, unspecified: Secondary | ICD-10-CM | POA: Insufficient documentation

## 2022-01-20 DIAGNOSIS — I10 Essential (primary) hypertension: Secondary | ICD-10-CM | POA: Insufficient documentation

## 2022-01-20 DIAGNOSIS — E1151 Type 2 diabetes mellitus with diabetic peripheral angiopathy without gangrene: Secondary | ICD-10-CM | POA: Diagnosis not present

## 2022-01-20 DIAGNOSIS — Z87891 Personal history of nicotine dependence: Secondary | ICD-10-CM | POA: Diagnosis not present

## 2022-01-20 DIAGNOSIS — G4730 Sleep apnea, unspecified: Secondary | ICD-10-CM | POA: Insufficient documentation

## 2022-01-20 DIAGNOSIS — Z8673 Personal history of transient ischemic attack (TIA), and cerebral infarction without residual deficits: Secondary | ICD-10-CM | POA: Insufficient documentation

## 2022-01-20 DIAGNOSIS — E1136 Type 2 diabetes mellitus with diabetic cataract: Secondary | ICD-10-CM | POA: Insufficient documentation

## 2022-01-20 DIAGNOSIS — H2511 Age-related nuclear cataract, right eye: Secondary | ICD-10-CM | POA: Diagnosis not present

## 2022-01-20 DIAGNOSIS — H25811 Combined forms of age-related cataract, right eye: Secondary | ICD-10-CM | POA: Diagnosis not present

## 2022-01-20 HISTORY — PX: CATARACT EXTRACTION W/PHACO: SHX586

## 2022-01-20 LAB — GLUCOSE, CAPILLARY
Glucose-Capillary: 97 mg/dL (ref 70–99)
Glucose-Capillary: 102 mg/dL — ABNORMAL HIGH (ref 70–99)

## 2022-01-20 SURGERY — PHACOEMULSIFICATION, CATARACT, WITH IOL INSERTION
Anesthesia: Monitor Anesthesia Care | Site: Eye | Laterality: Right

## 2022-01-20 MED ORDER — ACETAMINOPHEN 325 MG PO TABS: 325.0000 mg | ORAL_TABLET | ORAL | Status: DC | PRN

## 2022-01-20 MED ORDER — FENTANYL CITRATE (PF) 100 MCG/2ML IJ SOLN
INTRAMUSCULAR | Status: DC | PRN
  Administered 2022-01-20: 50 ug via INTRAVENOUS

## 2022-01-20 MED ORDER — ACETAMINOPHEN 160 MG/5ML PO SOLN: 325.0000 mg | ORAL | Status: DC | PRN

## 2022-01-20 MED ORDER — SIGHTPATH DOSE#1 BSS IO SOLN
INTRAOCULAR | Status: DC | PRN
  Administered 2022-01-20: 1 mL via INTRAMUSCULAR

## 2022-01-20 MED ORDER — MIDAZOLAM HCL 2 MG/2ML IJ SOLN
INTRAMUSCULAR | Status: DC | PRN
  Administered 2022-01-20: 1 mg via INTRAVENOUS

## 2022-01-20 MED ORDER — SIGHTPATH DOSE#1 BSS IO SOLN
INTRAOCULAR | Status: DC | PRN
  Administered 2022-01-20: 63 mL via OPHTHALMIC

## 2022-01-20 MED ORDER — ONDANSETRON HCL 4 MG/2ML IJ SOLN: 4.0000 mg | Freq: Once | INTRAMUSCULAR | Status: DC | PRN

## 2022-01-20 MED ORDER — BRIMONIDINE TARTRATE-TIMOLOL 0.2-0.5 % OP SOLN
OPHTHALMIC | Status: DC | PRN
  Administered 2022-01-20: 1 [drp] via OPHTHALMIC

## 2022-01-20 MED ORDER — LACTATED RINGERS IV SOLN: INTRAVENOUS | Status: DC

## 2022-01-20 MED ORDER — TETRACAINE HCL 0.5 % OP SOLN
1.0000 [drp] | OPHTHALMIC | Status: DC | PRN
  Administered 2022-01-20 (×3): 1 [drp] via OPHTHALMIC

## 2022-01-20 MED ORDER — MOXIFLOXACIN HCL 0.5 % OP SOLN
OPHTHALMIC | Status: DC | PRN
  Administered 2022-01-20: 0.2 mL via OPHTHALMIC

## 2022-01-20 MED ORDER — ARMC OPHTHALMIC DILATING DROPS
1.0000 "application " | OPHTHALMIC | Status: DC | PRN
  Administered 2022-01-20 (×3): 1 via OPHTHALMIC

## 2022-01-20 MED ORDER — SIGHTPATH DOSE#1 NA HYALUR & NA CHOND-NA HYALUR IO KIT
PACK | INTRAOCULAR | Status: DC | PRN
  Administered 2022-01-20: 1 via OPHTHALMIC

## 2022-01-20 MED ORDER — SIGHTPATH DOSE#1 BSS IO SOLN
INTRAOCULAR | Status: DC | PRN
  Administered 2022-01-20: 15 mL

## 2022-01-20 SURGICAL SUPPLY — 11 items
CATARACT SUITE SIGHTPATH (MISCELLANEOUS) ×2 IMPLANT
FEE CATARACT SUITE SIGHTPATH (MISCELLANEOUS) ×1 IMPLANT
GLOVE SRG 8 PF TXTR STRL LF DI (GLOVE) ×1 IMPLANT
GLOVE SURG GAMMEX PI TX LF 7.5 (GLOVE) ×1 IMPLANT
GLOVE SURG UNDER POLY LF SZ8 (GLOVE) ×2
LENS IOL TECNIS EYHANCE 20.5 (Intraocular Lens) ×1 IMPLANT
NDL FILTER BLUNT 18X1 1/2 (NEEDLE) ×1 IMPLANT
NEEDLE FILTER BLUNT 18X 1/2SAF (NEEDLE) ×1
NEEDLE FILTER BLUNT 18X1 1/2 (NEEDLE) ×1 IMPLANT
SYR 3ML LL SCALE MARK (SYRINGE) ×2 IMPLANT
WATER STERILE IRR 250ML POUR (IV SOLUTION) ×2 IMPLANT

## 2022-01-20 NOTE — Transfer of Care (Signed)
Immediate Anesthesia Transfer of Care Note  Patient: Judy Bolton  Procedure(s) Performed: CATARACT EXTRACTION PHACO AND INTRAOCULAR LENS PLACEMENT (IOC) RIGHT DIABETIC (Right: Eye)  Patient Location: PACU  Anesthesia Type: MAC  Level of Consciousness: awake, alert  and patient cooperative  Airway and Oxygen Therapy: Patient Spontanous Breathing and Patient connected to supplemental oxygen  Post-op Assessment: Post-op Vital signs reviewed, Patient's Cardiovascular Status Stable, Respiratory Function Stable, Patent Airway and No signs of Nausea or vomiting  Post-op Vital Signs: Reviewed and stable  Complications: No notable events documented.

## 2022-01-20 NOTE — Anesthesia Preprocedure Evaluation (Signed)
Anesthesia Evaluation  Patient identified by MRN, date of birth, ID band Patient awake    Reviewed: Allergy & Precautions, NPO status , Patient's Chart, lab work & pertinent test results  History of Anesthesia Complications Negative for: history of anesthetic complications  Airway Mallampati: III  TM Distance: >3 FB Neck ROM: Full    Dental  (+)    Pulmonary asthma , sleep apnea , COPD, former smoker,    Pulmonary exam normal breath sounds clear to auscultation       Cardiovascular Exercise Tolerance: Good hypertension, + Peripheral Vascular Disease  Normal cardiovascular exam Rhythm:Regular Rate:Normal     Neuro/Psych Seizures - (x1, not on medication),  [place] (2017, no deficits)    GI/Hepatic GERD  ,  Endo/Other  diabetes  Renal/GU      Musculoskeletal  (+) Arthritis ,   Abdominal Normal abdominal exam  (+) - obese,  Abdomen: soft.    Peds  Hematology negative hematology ROS (+)   Anesthesia Other Findings   Reproductive/Obstetrics                             Anesthesia Physical  Anesthesia Plan  ASA: III  Anesthesia Plan: MAC   Post-op Pain Management:    Induction: Intravenous  PONV Risk Score and Plan: 2 and TIVA and Midazolam  Airway Management Planned: Natural Airway and Nasal Cannula  Additional Equipment:   Intra-op Plan:   Post-operative Plan:   Informed Consent: I have reviewed the patients History and Physical, chart, labs and discussed the procedure including the risks, benefits and alternatives for the proposed anesthesia with the patient or authorized representative who has indicated his/her understanding and acceptance.     Dental advisory given  Plan Discussed with: CRNA and Anesthesiologist  Anesthesia Plan Comments:         Anesthesia Quick Evaluation  Patient Active Problem List   Diagnosis Date Noted    Essential hypertension 08/30/2017   Gastroesophageal reflux disease 08/30/2017   Aortic atherosclerosis ([place]) 05/31/2017   PAD (peripheral artery disease) ([place]) 05/31/2017   Encounter for long-term (current) use of high-risk medication 12/28/2016   Seronegative rheumatoid arthritis ([place]) 12/28/2016   Bilateral hand pain 11/08/2016   Elevated C-reactive protein 11/08/2016   Swelling of joint of right hand 11/08/2016   Type 2 diabetes mellitus with microalbuminuria, without long-term current use of insulin ([place]) 07/06/2016   Acute anxiety 04/16/2016   Bronchitis 04/16/2016   Centrilobular emphysema ([place]) 04/16/2016   TIA (transient ischemic attack) 02/29/2016   Lumbar stenosis with neurogenic claudication 04/16/2015   Spondylosis of lumbar region without myelopathy or radiculopathy 04/16/2015   Hyperlipidemia 10/15/2014   CBC Latest Ref Rng & Units 11/10/2021 09/12/2020 05/31/2019  WBC 3.4 - 10.8 x10E3/uL 9.0 11.8(H) 8.2  Hemoglobin 11.1 - 15.9 g/dL 12.5 13.4 14.2  Hematocrit 34.0 - 46.6 % 37.9 38.9 42.0  Platelets 150 - 450 x10E3/uL 255 252 235   BMP Latest Ref Rng & Units 10/12/2021 09/12/2020 10/03/2019  Glucose 70 - 99 mg/dL 109(H) 204(H) -  BUN 8 - 27 mg/dL 27 37(H) -  Creatinine 0.57 - 1.00 mg/dL 1.27(H) 1.32(H) 1.22(H)  BUN/Creat Ratio 12 - 28 21 - -  Sodium 134 - 144 mmol/L 141 135 -  Potassium 3.5 - 5.2 mmol/L 4.6 3.9 -  Chloride 96 - 106 mmol/L 102 99 -  CO2 20 - 29 mmol/L 24 26 -  Calcium 8.7 - 10.3  mg/dL 10.0 9.9 -    Risks and benefits of anesthesia discussed at length, patient or surrogate demonstrates understanding. Appropriately NPO. Plan to proceed with anesthesia.  Champ Mungo, MD 01/20/22

## 2022-01-20 NOTE — Op Note (Signed)
LOCATION:  [place]   PREOPERATIVE DIAGNOSIS:    Nuclear sclerotic cataract right eye. H25.11   POSTOPERATIVE DIAGNOSIS:  Nuclear sclerotic cataract right eye.     PROCEDURE:  Phacoemusification with posterior chamber intraocular lens placement of the right eye   ULTRASOUND TIME: Procedure(s) with comments: CATARACT EXTRACTION PHACO AND INTRAOCULAR LENS PLACEMENT (IOC) RIGHT DIABETIC (Right) - 6.03 01:02.2  LENS:   Implant Name Type Inv. Item Serial No. Manufacturer Lot No. LRB No. Used Action  LENS IOL TECNIS EYHANCE 20.5 - [id number] Intraocular Lens LENS IOL TECNIS EYHANCE 20.5 [id number] [employer]  Right 1 Implanted         SURGEON:  Wyonia Hough, MD   ANESTHESIA:  Topical with tetracaine drops and 2% Xylocaine jelly, augmented with 1% preservative-free intracameral lidocaine.    COMPLICATIONS:  None.   DESCRIPTION OF PROCEDURE:  The patient was identified in the holding room and transported to the operating room and placed in the supine position under the operating microscope.  The right eye was identified as the operative eye and it was prepped and draped in the usual sterile ophthalmic fashion.   A 1 millimeter clear-corneal paracentesis was made at the 12:00 position.  0.5 ml of preservative-free 1% lidocaine was injected into the anterior chamber. The anterior chamber was filled with Viscoat viscoelastic.  A 2.4 millimeter keratome was used to make a near-clear corneal incision at the 9:00 position.  A curvilinear capsulorrhexis was made with a cystotome and capsulorrhexis forceps.  Balanced salt solution was used to hydrodissect and hydrodelineate the nucleus.   Phacoemulsification was then used in stop and chop fashion to remove the lens nucleus and epinucleus.  The remaining cortex was then removed using the irrigation and aspiration handpiece. Provisc was then placed into the capsular bag to distend it for lens placement.  A lens was then injected  into the capsular bag.  The remaining viscoelastic was aspirated.   Wounds were hydrated with balanced salt solution.  The anterior chamber was inflated to a physiologic pressure with balanced salt solution.  No wound leaks were noted. Vigamox 0.2 ml of a 1mg  per ml solution was injected into the anterior chamber for a dose of 0.2 mg of intracameral antibiotic at the completion of the case.   Timolol and Brimonidine drops were applied to the eye.  The patient was taken to the recovery room in stable condition without complications of anesthesia or surgery.   Kenidee Cregan 01/20/2022, 8:08 AM

## 2022-01-20 NOTE — Anesthesia Postprocedure Evaluation (Signed)
Anesthesia Post Note  Patient: Judy Bolton  Procedure(s) Performed: CATARACT EXTRACTION PHACO AND INTRAOCULAR LENS PLACEMENT (IOC) RIGHT DIABETIC (Right: Eye)     Patient location during evaluation: PACU Anesthesia Type: MAC Level of consciousness: awake and alert Pain management: pain level controlled Vital Signs Assessment: post-procedure vital signs reviewed and stable Respiratory status: spontaneous breathing, nonlabored ventilation, respiratory function stable and patient connected to nasal cannula oxygen Cardiovascular status: stable and blood pressure returned to baseline Postop Assessment: no apparent nausea or vomiting Anesthetic complications: no   No notable events documented.  Sinda Du

## 2022-01-20 NOTE — H&P (Signed)
[facility]   Primary Care Physician:  Juline Patch, MD Ophthalmologist: Dr. Leandrew Koyanagi  Pre-Procedure History & Physical: HPI:  Judy Bolton is a 76 y.o. female here for ophthalmic surgery.   Past Medical History:  Diagnosis Date   Acid reflux    Arthritis    Asthma    Back pain    Back pain    COPD (chronic obstructive pulmonary disease) ([place])    Diabetes mellitus without complication ([place])    Hyperlipidemia    Hypertension    Seizure ([place])    x1 - over 30 yrs ago   Sleep apnea    No CPAP   Stroke [facility])    March of 2017 vocabulary    Past Surgical History:  Procedure Laterality Date   ABDOMINAL HYSTERECTOMY     ANTERIOR (CYSTOCELE) AND POSTERIOR REPAIR (RECTOCELE) WITH XENFORM GRAFT AND SACROSPINOUS FIXATION     APPENDECTOMY     BROW LIFT Bilateral 09/05/2018   Procedure: BLEPHAROPLASTY UPPER EYELID W/EXCESS SKIN;  Surgeon: Karle Starch, MD;  Location: [place];  Service: Ophthalmology;  Laterality: Bilateral;   CATARACT EXTRACTION W/PHACO Left 01/06/2022   Procedure: CATARACT EXTRACTION PHACO AND INTRAOCULAR LENS PLACEMENT (IOC) LEFT DIABETIC 8.09 01:07.3;  Surgeon: Leandrew Koyanagi, MD;  Location: [place];  Service: Ophthalmology;  Laterality: Left;   CTR Bilateral    LUMBAR LAMINECTOMY/DECOMPRESSION MICRODISCECTOMY N/A 07/27/2017   Procedure: LUMBAR LAMINECTOMY/DECOMPRESSION MICRODISCECTOMY 3 LEVELS-L3-S1;  Surgeon: Meade Maw, MD;  Location: [facility];  Service: Neurosurgery;  Laterality: N/A;   PTOSIS REPAIR Bilateral 09/05/2018   Procedure: PTOSIS REPAIR RESECT EX;  Surgeon: Karle Starch, MD;  Location: [place];  Service: Ophthalmology;  Laterality: Bilateral;  Diabetic - oral meds sleep apnea Latexd allergy   ROTATOR CUFF REPAIR Right     Prior to Admission medications   Medication Sig Start Date End Date Taking? Authorizing Provider  albuterol (PROVENTIL HFA;VENTOLIN HFA) 108 (90 Base)  MCG/ACT inhaler Inhale 2 puffs into the lungs every 6 (six) hours as needed for wheezing or shortness of breath.   Yes [provider]  albuterol (PROVENTIL) (2.5 MG/3ML) 0.083% nebulizer solution Take 3 mLs (2.5 mg total) by nebulization every 6 (six) hours as needed for wheezing or shortness of breath. 07/10/18  Yes Otilio Miu C, MD  amLODipine (NORVASC) 2.5 MG tablet TAKE 1 TABLET(2.5 MG) BY MOUTH DAILY 10/12/21  Yes Juline Patch, MD  atorvastatin (LIPITOR) 40 MG tablet TAKE 1 TABLET(40 MG) BY MOUTH DAILY 11/15/21  Yes Juline Patch, MD  celecoxib (CELEBREX) 200 MG capsule Take 200 mg by mouth daily. mundy 11/20/20  Yes [provider]  cholecalciferol (VITAMIN D3) 25 MCG (1000 UNIT) tablet Take 1,000 Units by mouth daily.   Yes [provider]  Dulaglutide (TRULICITY) 1.5 0000000 SOPN Inject into the skin every 14 (fourteen) days.   Yes [provider]  gabapentin (NEURONTIN) 100 MG capsule Take 2 capsules by mouth at bedtime. mundy 12/10/19 01/20/22 Yes [provider]  gemfibrozil (LOPID) 600 MG tablet TAKE 1 TABLET(600 MG) BY MOUTH TWICE DAILY 11/15/21  Yes Juline Patch, MD  hydrochlorothiazide (HYDRODIURIL) 25 MG tablet TAKE 1 TABLET (25 MG TOTAL) BY MOUTH DAILY. 12/23/21  Yes Otilio Miu C, MD  ipratropium (ATROVENT) 0.02 % nebulizer solution Inhale into the lungs. 03/29/19  Yes [provider]  metFORMIN (GLUCOPHAGE) 500 MG tablet Take 500 mg by mouth 2 (two) times daily. 01/12/20  Yes [provider]  metoprolol tartrate (LOPRESSOR) 50 MG tablet TAKE 1 TABLET TWICE DAILY 12/23/21  Yes Juline Patch, MD  montelukast (SINGULAIR) 10 MG tablet Take 1 tablet (10 mg total) by mouth daily. 10/12/21  Yes Juline Patch, MD  pantoprazole (PROTONIX) 40 MG tablet TAKE 1 TABLET BY MOUTH EVERY DAY 12/18/21  Yes Juline Patch, MD  vitamin B-12 (CYANOCOBALAMIN) 1000 MCG tablet Take 1,000 mcg by mouth daily.   Yes [provider]   aspirin EC 81 MG tablet Take by mouth. Patient not taking: Reported on 12/23/2021    [provider]  cloNIDine (CATAPRES) 0.1 MG tablet Take 1 tablet (0.1 mg total) by mouth 2 (two) times daily as needed. Patient not taking: Reported on 12/23/2021 09/13/20   Paulette Blanch, MD  docusate sodium (COLACE) 100 MG capsule Take 100 mg by mouth daily. Patient not taking: Reported on 12/23/2021    [provider]  glipiZIDE (GLUCOTROL) 5 MG tablet Take 5 mg by mouth every morning. Patient not taking: Reported on 12/23/2021 01/16/20   [provider]  omeprazole (PRILOSEC) 20 MG capsule Take 20 mg by mouth daily. otc Patient not taking: Reported on 12/23/2021    [provider]    Allergies as of 12/04/2021 - Review Complete 11/10/2021  Allergen Reaction Noted   Betadine [povidone iodine] Swelling 08/29/2018   Codeine Itching 04/21/2015   Latex Itching and Swelling 02/29/2016   Losartan  02/02/2021   Meloxicam Swelling 06/27/2017   Penicillins Other (See Comments) 04/21/2015   Valsartan  02/02/2021    Family History  Problem Relation Age of Onset   Heart attack Mother    Prostate cancer Father    Brain cancer Father    Heart attack Maternal Grandmother    Heart attack Maternal Grandfather    Breast cancer Paternal Grandmother     Social History   Socioeconomic History   Marital status: Widowed    Spouse name: Not on file   Number of children: 1   Years of education: some college   Highest education level: 12th grade  Occupational History   Occupation: Retired  Tobacco Use   Smoking status: Former    Packs/day: 1.50    Years: 28.00    Pack years: 42.00    Types: Cigarettes    Quit date: 08/14/2000    Years since quitting: 21.4   Smokeless tobacco: Never   Tobacco comments:    smoking cessatiuon materials not required  Vaping Use   Vaping Use: Never used  Substance and Sexual Activity   Alcohol use: No   Drug use: No   Sexual activity: Not  Currently  Other Topics Concern   Not on file  Social History Narrative   Pt lives alone   Social Determinants of Health   Financial Resource Strain: Low Risk    Difficulty of Paying Living Expenses: Not hard at all  Food Insecurity: No Food Insecurity   Worried About Charity fundraiser in the Last Year: Never true   [place] in the Last Year: Never true  Transportation Needs: No Transportation Needs   Lack of Transportation (Medical): No   Lack of Transportation (Non-Medical): No  Physical Activity: Inactive   Days of Exercise per Week: 0 days   Minutes of Exercise per Session: 0 min  Stress: No Stress Concern Present   Feeling of Stress : Not at all  Social Connections: Moderately Integrated   Frequency of Communication with Friends  and Family: More than three times a week   Frequency of Social Gatherings with Friends and Family: More than three times a week   Attends Religious Services: More than 4 times per year   Active Member of Clubs or Organizations: Yes   Attends Archivist Meetings: More than 4 times per year   Marital Status: Widowed  Human resources officer Violence: Not At Risk   Fear of Current or Ex-Partner: No   Emotionally Abused: No   Physically Abused: No   Sexually Abused: No    Review of Systems: See HPI, otherwise negative ROS  Physical Exam: BP (!) 167/82    Pulse 64    Temp (!) 97.2 F (36.2 C) (Temporal)    Ht 5\' 1"  (1.549 m)    Wt 60.3 kg    SpO2 97%    BMI 25.13 kg/m  General:   Alert,  pleasant and cooperative in NAD Head:  Normocephalic and atraumatic. Lungs:  Clear to auscultation.    Heart:  Regular rate and rhythm.   Impression/Plan: Judy Bolton is here for ophthalmic surgery.  Risks, benefits, limitations, and alternatives regarding ophthalmic surgery have been reviewed with the patient.  Questions have been answered.  All parties agreeable.   Leandrew Koyanagi, MD  01/20/2022, 7:30 AM

## 2022-01-20 NOTE — Anesthesia Procedure Notes (Signed)
Procedure Name: MAC Date/Time: 01/20/2022 7:47 AM Performed by: Dionne Bucy, CRNA Pre-anesthesia Checklist: Patient identified, Emergency Drugs available, Suction available, Patient being monitored and Timeout performed Patient Re-evaluated:Patient Re-evaluated prior to induction Oxygen Delivery Method: Nasal cannula Placement Confirmation: positive ETCO2

## 2022-01-21 ENCOUNTER — Encounter: Payer: Self-pay | Admitting: Ophthalmology

## 2022-02-12 DIAGNOSIS — Z961 Presence of intraocular lens: Secondary | ICD-10-CM | POA: Diagnosis not present

## 2022-02-22 ENCOUNTER — Other Ambulatory Visit: Payer: Self-pay | Admitting: Family Medicine

## 2022-02-22 DIAGNOSIS — E785 Hyperlipidemia, unspecified: Secondary | ICD-10-CM

## 2022-02-22 DIAGNOSIS — J301 Allergic rhinitis due to pollen: Secondary | ICD-10-CM

## 2022-03-04 ENCOUNTER — Other Ambulatory Visit: Payer: Self-pay

## 2022-03-04 DIAGNOSIS — R1011 Right upper quadrant pain: Secondary | ICD-10-CM

## 2022-03-04 MED ORDER — PANTOPRAZOLE SODIUM 40 MG PO TBEC: 40.0000 mg | DELAYED_RELEASE_TABLET | Freq: Every day | ORAL | 0 refills | Status: DC

## 2022-03-10 ENCOUNTER — Ambulatory Visit: Payer: Self-pay

## 2022-03-19 ENCOUNTER — Telehealth: Payer: Self-pay | Admitting: Family Medicine

## 2022-03-19 DIAGNOSIS — E785 Hyperlipidemia, unspecified: Secondary | ICD-10-CM

## 2022-03-19 MED ORDER — ATORVASTATIN CALCIUM 40 MG PO TABS: ORAL_TABLET | ORAL | 1 refills | Status: DC

## 2022-03-19 NOTE — Telephone Encounter (Signed)
Requested Prescriptions  ?Pending Prescriptions Disp Refills  ?? atorvastatin (LIPITOR) 40 MG tablet 90 tablet 1  ?  ? Cardiovascular:  Antilipid - Statins Failed - 03/19/2022 12:45 PM  ?  ?  Failed - Lipid Panel in normal range within the last 12 months  ?  Cholesterol, Total  ?Date Value Ref Range Status  ?10/12/2021 179 100 - 199 mg/dL Final  ? ?LDL Chol Calc (NIH)  ?Date Value Ref Range Status  ?10/12/2021 103 (H) 0 - 99 mg/dL Final  ? ?HDL  ?Date Value Ref Range Status  ?10/12/2021 53 >39 mg/dL Final  ? ?Triglycerides  ?Date Value Ref Range Status  ?10/12/2021 128 0 - 149 mg/dL Final  ? ?  ?  ?  Passed - Patient is not pregnant  ?  ?  Passed - Valid encounter within last 12 months  ?  Recent Outpatient Visits   ?      ? 4 months ago Right upper quadrant pain  ? [facility] Juline Patch, MD  ? 5 months ago Essential hypertension  ? [facility] Juline Patch, MD  ? 10 months ago Essential hypertension  ? [facility] Juline Patch, MD  ? 1 year ago Frequent urination  ? [facility] Juline Patch, MD  ? 1 year ago Essential hypertension  ? [facility] Juline Patch, MD  ?  ?  ?Future Appointments   ?        ? In 3 weeks Juline Patch, MD [place]  ?  ? ?  ?  ?  ? ? ?

## 2022-03-19 NOTE — Telephone Encounter (Signed)
Copied from CRM [id number]. Topic: General - Other ?>> Mar 19, 2022 11:02 AM Gaetana Michaelisoley, Everette A wrote: ?Reason for CRM: Medication Refill - Medication: atorvastatin (LIPITOR) 40 MG tablet [[id number] ? ?Has the patient contacted their pharmacy? Yes.  The patient's pharmacy has made contact on their behalf  ?(Agent: If no, request that the patient contact the pharmacy for the refill. If patient does not wish to contact the pharmacy document the reason why and proceed with request.) ?(Agent: If yes, when and what did the pharmacy advise?) ? ?Preferred Pharmacy (with phone number or street name): [facility] [place] - [id number] ?[address] [place] [id number] ?Phone: [phone] Fax: [phone] ?Hours: Not open 24 hours ? ?Has the patient been seen for an appointment in the last year OR does the patient have an upcoming appointment? Yes.   ? ?Agent: Please be advised that RX refills may take up to 3 business days. We ask that you follow-up with your pharmacy. ?

## 2022-03-22 NOTE — Telephone Encounter (Signed)
Call to pharmacy- verifies Rx is on file and is being processed for patient- Lisa,PT.  ?Call to patient - notified should be on the way to her today. ?

## 2022-03-22 NOTE — Telephone Encounter (Addendum)
Pt stated pharmacy called her today and advised her they still don't medication. ?Pt requesting a follow up on medication.  ? ?[facility] - [place], [place] - [address]  ?[address] [place] [address]  ?Phone: [phone] Fax: [phone]  ?Hours: Not open 24 hours  ? ?

## 2022-03-23 DIAGNOSIS — J449 Chronic obstructive pulmonary disease, unspecified: Secondary | ICD-10-CM | POA: Diagnosis not present

## 2022-03-23 DIAGNOSIS — R052 Subacute cough: Secondary | ICD-10-CM | POA: Diagnosis not present

## 2022-03-29 ENCOUNTER — Ambulatory Visit (INDEPENDENT_AMBULATORY_CARE_PROVIDER_SITE_OTHER): Payer: Medicare HMO

## 2022-03-29 DIAGNOSIS — Z0000 Encounter for general adult medical examination without abnormal findings: Secondary | ICD-10-CM | POA: Diagnosis not present

## 2022-03-29 NOTE — Patient Instructions (Signed)
Judy Bolton , ?Thank you for taking time to come for your Medicare Wellness Visit. I appreciate your ongoing commitment to your health goals. Please review the following plan we discussed and let me know if I can assist you in the future.  ? ?Screening recommendations/referrals: ?Colonoscopy: done 10/29/14 ?Mammogram: done 04/13/21 ?Bone Density: done 04/13/21 ?Recommended yearly ophthalmology/optometry visit for glaucoma screening and checkup ?Recommended yearly dental visit for hygiene and checkup ? ?Vaccinations: ?Influenza vaccine: done 08/31/21 ?Pneumococcal vaccine: done 09/08/17 ?Tdap vaccine: done 08/30/17 ?Shingles vaccine: done 08/15/19. Due for second dose.    ?Covid-19: done 01/18/20, 02/12/20, 09/18/20, 03/18/21 & 09/30/21 ? ?Advanced directives: Please bring a copy of your health care power of attorney and living will to the office at your convenience.  ? ?Conditions/risks identified: Recommend preventing falls at home ? ?Next appointment: Follow up in one year for your annual wellness visit  ? ? ?Preventive Care 80 Years and Older, Female ?Preventive care refers to lifestyle choices and visits with your health care provider that can promote health and wellness. ?What does preventive care include? ?A yearly physical exam. This is also called an annual well check. ?Dental exams once or twice a year. ?Routine eye exams. Ask your health care provider how often you should have your eyes checked. ?Personal lifestyle choices, including: ?Daily care of your teeth and gums. ?Regular physical activity. ?Eating a healthy diet. ?Avoiding tobacco and drug use. ?Limiting alcohol use. ?Practicing safe sex. ?Taking low-dose aspirin every day. ?Taking vitamin and mineral supplements as recommended by your health care provider. ?What happens during an annual well check? ?The services and screenings done by your health care provider during your annual well check will depend on your age, overall health, lifestyle risk factors, and  family history of disease. ?Counseling  ?Your health care provider may ask you questions about your: ?Alcohol use. ?Tobacco use. ?Drug use. ?Emotional well-being. ?Home and relationship well-being. ?Sexual activity. ?Eating habits. ?History of falls. ?Memory and ability to understand (cognition). ?Work and work Astronomer. ?Reproductive health. ?Screening  ?You may have the following tests or measurements: ?Height, weight, and BMI. ?Blood pressure. ?Lipid and cholesterol levels. These may be checked every 5 years, or more frequently if you are over 15 years old. ?Skin check. ?Lung cancer screening. You may have this screening every year starting at age 58 if you have a 30-pack-year history of smoking and currently smoke or have quit within the past 15 years. ?Fecal occult blood test (FOBT) of the stool. You may have this test every year starting at age 62. ?Flexible sigmoidoscopy or colonoscopy. You may have a sigmoidoscopy every 5 years or a colonoscopy every 10 years starting at age 57. ?Hepatitis C blood test. ?Hepatitis B blood test. ?Sexually transmitted disease (STD) testing. ?Diabetes screening. This is done by checking your blood sugar (glucose) after you have not eaten for a while (fasting). You may have this done every 1-3 years. ?Bone density scan. This is done to screen for osteoporosis. You may have this done starting at age 47. ?Mammogram. This may be done every 1-2 years. Talk to your health care provider about how often you should have regular mammograms. ?Talk with your health care provider about your test results, treatment options, and if necessary, the need for more tests. ?Vaccines  ?Your health care provider may recommend certain vaccines, such as: ?Influenza vaccine. This is recommended every year. ?Tetanus, diphtheria, and acellular pertussis (Tdap, Td) vaccine. You may need a Td booster every 10 years. ?Zoster  vaccine. You may need this after age 63. ?Pneumococcal 13-valent conjugate  (PCV13) vaccine. One dose is recommended after age 38. ?Pneumococcal polysaccharide (PPSV23) vaccine. One dose is recommended after age 44. ?Talk to your health care provider about which screenings and vaccines you need and how often you need them. ?This information is not intended to replace advice given to you by your health care provider. Make sure you discuss any questions you have with your health care provider. ?Document Released: 12/19/2015 Document Revised: 08/11/2016 Document Reviewed: 09/23/2015 ?Elsevier Interactive Patient Education ? 2017 [place]. ? ?Fall Prevention in the Home ?Falls can cause injuries. They can happen to people of all ages. There are many things you can do to make your home safe and to help prevent falls. ?What can I do on the outside of my home? ?Regularly fix the edges of walkways and driveways and fix any cracks. ?Remove anything that might make you trip as you walk through a door, such as a raised step or threshold. ?Trim any bushes or trees on the path to your home. ?Use bright outdoor lighting. ?Clear any walking paths of anything that might make someone trip, such as rocks or tools. ?Regularly check to see if handrails are loose or broken. Make sure that both sides of any steps have handrails. ?Any raised decks and porches should have guardrails on the edges. ?Have any leaves, snow, or ice cleared regularly. ?Use sand or salt on walking paths during winter. ?Clean up any spills in your garage right away. This includes oil or grease spills. ?What can I do in the bathroom? ?Use night lights. ?Install grab bars by the toilet and in the tub and shower. Do not use towel bars as grab bars. ?Use non-skid mats or decals in the tub or shower. ?If you need to sit down in the shower, use a plastic, non-slip stool. ?Keep the floor dry. Clean up any water that spills on the floor as soon as it happens. ?Remove soap buildup in the tub or shower regularly. ?Attach bath mats securely with  double-sided non-slip rug tape. ?Do not have throw rugs and other things on the floor that can make you trip. ?What can I do in the bedroom? ?Use night lights. ?Make sure that you have a light by your bed that is easy to reach. ?Do not use any sheets or blankets that are too big for your bed. They should not hang down onto the floor. ?Have a firm chair that has side arms. You can use this for support while you get dressed. ?Do not have throw rugs and other things on the floor that can make you trip. ?What can I do in the kitchen? ?Clean up any spills right away. ?Avoid walking on wet floors. ?Keep items that you use a lot in easy-to-reach places. ?If you need to reach something above you, use a strong step stool that has a grab bar. ?Keep electrical cords out of the way. ?Do not use floor polish or wax that makes floors slippery. If you must use wax, use non-skid floor wax. ?Do not have throw rugs and other things on the floor that can make you trip. ?What can I do with my stairs? ?Do not leave any items on the stairs. ?Make sure that there are handrails on both sides of the stairs and use them. Fix handrails that are broken or loose. Make sure that handrails are as long as the stairways. ?Check any carpeting to make sure that it  is firmly attached to the stairs. Fix any carpet that is loose or worn. ?Avoid having throw rugs at the top or bottom of the stairs. If you do have throw rugs, attach them to the floor with carpet tape. ?Make sure that you have a light switch at the top of the stairs and the bottom of the stairs. If you do not have them, ask someone to add them for you. ?What else can I do to help prevent falls? ?Wear shoes that: ?Do not have high heels. ?Have rubber bottoms. ?Are comfortable and fit you well. ?Are closed at the toe. Do not wear sandals. ?If you use a stepladder: ?Make sure that it is fully opened. Do not climb a closed stepladder. ?Make sure that both sides of the stepladder are locked  into place. ?Ask someone to hold it for you, if possible. ?Clearly mark and make sure that you can see: ?Any grab bars or handrails. ?First and last steps. ?Where the edge of each step is. ?Use tools that help y

## 2022-03-29 NOTE — Progress Notes (Signed)
? ?Subjective:  ? Judy Bolton is a 76 y.o. female who presents for Medicare Annual (Subsequent) preventive examination. ? ?Virtual Visit via Telephone Note ? ?I connected with  Judy Bolton on 03/29/22 at  1:45 PM EDT by telephone and verified that I am speaking with the correct person using two identifiers. ? ?Location: ?Patient: home ?Provider: [facility] ?Persons participating in the virtual visit: patient/Nurse Health Advisor ?  ?I discussed the limitations, risks, security and privacy concerns of performing an evaluation and management service by telephone and the availability of in person appointments. The patient expressed understanding and agreed to proceed. ? ?Interactive audio and video telecommunications were attempted between this nurse and patient, however failed, due to patient having technical difficulties OR patient did not have access to video capability.  We continued and completed visit with audio only. ? ?Some vital signs may be absent or patient reported.  ? ?Clemetine Marker, LPN ? ? ?Review of Systems    ? ?Cardiac Risk Factors include: advanced age (>18men, >57 women);diabetes mellitus;dyslipidemia;hypertension;sedentary lifestyle ? ?   ?Objective:  ?  ?There were no vitals filed for this visit. ?There is no height or weight on file to calculate BMI. ? ? ?  03/29/2022  ?  1:56 PM 01/20/2022  ?  6:36 AM 01/06/2022  ?  8:49 AM 03/09/2021  ?  1:48 PM 09/12/2020  ? 10:59 PM 02/06/2020  ? 10:18 AM 05/31/2019  ? 11:37 AM  ?Advanced Directives  ?Does Patient Have a Medical Advance Directive? Yes Yes Yes No Yes Yes Yes  ?Type of Paramedic of [place];Living will [place];Living will [place];Living will   [place];Living will Healthcare Power of Attorney  ?Does patient want to make changes to medical advance directive?  No - Patient declined No - Patient declined    No - Patient declined  ?Copy of [place] in  Chart? No - copy requested Yes - validated most recent copy scanned in chart (See row information) No - copy requested   No - copy requested No - copy requested  ?Would patient like information on creating a medical advance directive?    Yes (MAU/Ambulatory/Procedural Areas - Information given)   No - Patient declined  ? ? ?Current Medications (verified) ?Outpatient Encounter Medications as of 03/29/2022  ?Medication Sig  ? albuterol (PROVENTIL HFA;VENTOLIN HFA) 108 (90 Base) MCG/ACT inhaler Inhale 2 puffs into the lungs every 6 (six) hours as needed for wheezing or shortness of breath.  ? albuterol (PROVENTIL) (2.5 MG/3ML) 0.083% nebulizer solution Take 3 mLs (2.5 mg total) by nebulization every 6 (six) hours as needed for wheezing or shortness of breath.  ? amLODipine (NORVASC) 2.5 MG tablet TAKE 1 TABLET(2.5 MG) BY MOUTH DAILY  ? atorvastatin (LIPITOR) 40 MG tablet Take one by mouth daily.  ? benzonatate (TESSALON) 100 MG capsule Take by mouth. Take 1 capsule (100 mg total) by mouth 3 (three) times daily as needed for Cough for up to 7 days  ? celecoxib (CELEBREX) 200 MG capsule Take 1 capsule by mouth daily.  ? cholecalciferol (VITAMIN D3) 25 MCG (1000 UNIT) tablet Take 1,000 Units by mouth daily.  ? cloNIDine (CATAPRES) 0.1 MG tablet Take 1 tablet (0.1 mg total) by mouth 2 (two) times daily as needed.  ? FASENRA 30 MG/ML SOSY Once every 2 months for asthma. Paid for by [employer]  ? gabapentin (NEURONTIN) 300 MG capsule Take 1 capsule by mouth  daily.  ? gemfibrozil (LOPID) 600 MG tablet TAKE 1 TABLET TWICE DAILY  ? hydrochlorothiazide (HYDRODIURIL) 25 MG tablet TAKE 1 TABLET (25 MG TOTAL) BY MOUTH DAILY.  ? ipratropium (ATROVENT) 0.02 % nebulizer solution Inhale into the lungs.  ? metFORMIN (GLUCOPHAGE) 500 MG tablet Take 500 mg by mouth 2 (two) times daily.  ? metoprolol tartrate (LOPRESSOR) 50 MG tablet TAKE 1 TABLET TWICE DAILY  ? montelukast (SINGULAIR) 10 MG tablet TAKE 1 TABLET (10 MG TOTAL) BY MOUTH  DAILY.  ? pantoprazole (PROTONIX) 40 MG tablet Take 1 tablet (40 mg total) by mouth daily.  ? vitamin B-12 (CYANOCOBALAMIN) 1000 MCG tablet Take 1,000 mcg by mouth daily.  ? [DISCONTINUED] ipratropium (ATROVENT) 0.02 % nebulizer solution Inhale into the lungs. Take 2.5 mLs (0.5 mg total) by nebulization 4 (four) times daily  ? Dulaglutide (TRULICITY) 1.5 0000000 SOPN Inject into the skin every 14 (fourteen) days. (Patient not taking: Reported on 03/29/2022)  ? [DISCONTINUED] aspirin EC 81 MG tablet Take by mouth. (Patient not taking: Reported on 12/23/2021)  ? [DISCONTINUED] celecoxib (CELEBREX) 200 MG capsule Take 200 mg by mouth daily. mundy  ? [DISCONTINUED] docusate sodium (COLACE) 100 MG capsule Take 100 mg by mouth daily. (Patient not taking: Reported on 12/23/2021)  ? [DISCONTINUED] gabapentin (NEURONTIN) 100 MG capsule Take 2 capsules by mouth at bedtime. mundy  ? [DISCONTINUED] glipiZIDE (GLUCOTROL) 5 MG tablet Take 5 mg by mouth every morning. (Patient not taking: Reported on 12/23/2021)  ? [DISCONTINUED] omeprazole (PRILOSEC) 20 MG capsule Take 20 mg by mouth daily. otc (Patient not taking: Reported on 12/23/2021)  ? ?No facility-administered encounter medications on file as of 03/29/2022.  ? ? ?Allergies (verified) ?Rosuvastatin, Betadine [povidone iodine], Codeine, Cyclobenzaprine, Latex, Losartan, Meloxicam, Penicillins, and Valsartan  ? ?History: ?Past Medical History:  ?Diagnosis Date  ? Acid reflux   ? Arthritis   ? Asthma   ? Back pain   ? Back pain   ? COPD (chronic obstructive pulmonary disease) ([place])   ? Diabetes mellitus without complication ([place])   ? Hyperlipidemia   ? Hypertension   ? Seizure ([place])   ? x1 - over 30 yrs ago  ? Sleep apnea   ? No CPAP  ? Stroke [facility])   ? March of 2017 vocabulary  ? ?Past Surgical History:  ?Procedure Laterality Date  ? ABDOMINAL HYSTERECTOMY    ? ANTERIOR (CYSTOCELE) AND POSTERIOR REPAIR (RECTOCELE) WITH XENFORM GRAFT AND SACROSPINOUS FIXATION    ? APPENDECTOMY    ?  BROW LIFT Bilateral 09/05/2018  ? Procedure: BLEPHAROPLASTY UPPER EYELID W/EXCESS SKIN;  Surgeon: Karle Starch, MD;  Location: [place];  Service: Ophthalmology;  Laterality: Bilateral;  ? CATARACT EXTRACTION W/PHACO Left 01/06/2022  ? Procedure: CATARACT EXTRACTION PHACO AND INTRAOCULAR LENS PLACEMENT (IOC) LEFT DIABETIC 8.09 01:07.3;  Surgeon: Leandrew Koyanagi, MD;  Location: [place];  Service: Ophthalmology;  Laterality: Left;  ? CATARACT EXTRACTION W/PHACO Right 01/20/2022  ? Procedure: CATARACT EXTRACTION PHACO AND INTRAOCULAR LENS PLACEMENT ([place]) RIGHT DIABETIC;  Surgeon: Leandrew Koyanagi, MD;  Location: [place];  Service: Ophthalmology;  Laterality: Right;  6.03 ?01:02.2  ? CTR Bilateral   ? LUMBAR LAMINECTOMY/DECOMPRESSION MICRODISCECTOMY N/A 07/27/2017  ? Procedure: LUMBAR LAMINECTOMY/DECOMPRESSION MICRODISCECTOMY 3 LEVELS-L3-S1;  Surgeon: Meade Maw, MD;  Location: [facility];  Service: Neurosurgery;  Laterality: N/A;  ? PTOSIS REPAIR Bilateral 09/05/2018  ? Procedure: PTOSIS REPAIR RESECT EX;  Surgeon: Karle Starch, MD;  Location: [place];  Service: Ophthalmology;  Laterality: Bilateral;  Diabetic - oral meds ?sleep apnea ?Latexd allergy  ? ROTATOR CUFF REPAIR Right   ? ?Family History  ?Problem Relation Age of Onset  ? Heart attack Mother   ? Prostate cancer Father   ? Brain cancer Father   ? Heart attack Maternal Grandmother   ? Heart attack Maternal Grandfather   ? Breast cancer Paternal Grandmother   ? ?Social History  ? ?Socioeconomic History  ? Marital status: Widowed  ?  Spouse name: Not on file  ? Number of children: 1  ? Years of education: some college  ? Highest education level: 12th grade  ?Occupational History  ? Occupation: Retired  ?Tobacco Use  ? Smoking status: Former  ?  Packs/day: 1.50  ?  Years: 28.00  ?  Pack years: 42.00  ?  Types: Cigarettes  ?  Quit date: 08/14/2000  ?  Years since quitting: 21.6  ? Smokeless tobacco: Never  ?  Tobacco comments:  ?  smoking cessatiuon materials not required  ?Vaping Use  ? Vaping Use: Never used  ?Substance and Sexual Activity  ? Alcohol use: No  ? Drug use: No  ? Sexual activity: Not Currently

## 2022-04-02 DIAGNOSIS — J45909 Unspecified asthma, uncomplicated: Secondary | ICD-10-CM | POA: Diagnosis not present

## 2022-04-02 DIAGNOSIS — I10 Essential (primary) hypertension: Secondary | ICD-10-CM | POA: Diagnosis not present

## 2022-04-02 DIAGNOSIS — M25512 Pain in left shoulder: Secondary | ICD-10-CM | POA: Diagnosis not present

## 2022-04-02 DIAGNOSIS — S4992XA Unspecified injury of left shoulder and upper arm, initial encounter: Secondary | ICD-10-CM | POA: Diagnosis not present

## 2022-04-02 DIAGNOSIS — Z880 Allergy status to penicillin: Secondary | ICD-10-CM | POA: Diagnosis not present

## 2022-04-02 DIAGNOSIS — W01198A Fall on same level from slipping, tripping and stumbling with subsequent striking against other object, initial encounter: Secondary | ICD-10-CM | POA: Diagnosis not present

## 2022-04-02 DIAGNOSIS — E119 Type 2 diabetes mellitus without complications: Secondary | ICD-10-CM | POA: Diagnosis not present

## 2022-04-02 DIAGNOSIS — S42215A Unspecified nondisplaced fracture of surgical neck of left humerus, initial encounter for closed fracture: Secondary | ICD-10-CM | POA: Diagnosis not present

## 2022-04-02 DIAGNOSIS — S4292XA Fracture of left shoulder girdle, part unspecified, initial encounter for closed fracture: Secondary | ICD-10-CM | POA: Diagnosis not present

## 2022-04-02 DIAGNOSIS — Z885 Allergy status to narcotic agent status: Secondary | ICD-10-CM | POA: Diagnosis not present

## 2022-04-12 DIAGNOSIS — R809 Proteinuria, unspecified: Secondary | ICD-10-CM | POA: Diagnosis not present

## 2022-04-12 DIAGNOSIS — M25512 Pain in left shoulder: Secondary | ICD-10-CM | POA: Diagnosis not present

## 2022-04-12 DIAGNOSIS — S42202A Unspecified fracture of upper end of left humerus, initial encounter for closed fracture: Secondary | ICD-10-CM | POA: Diagnosis not present

## 2022-04-12 DIAGNOSIS — I700 Atherosclerosis of aorta: Secondary | ICD-10-CM | POA: Diagnosis not present

## 2022-04-12 DIAGNOSIS — E1129 Type 2 diabetes mellitus with other diabetic kidney complication: Secondary | ICD-10-CM | POA: Diagnosis not present

## 2022-04-13 ENCOUNTER — Ambulatory Visit: Payer: Medicare HMO | Admitting: Family Medicine

## 2022-04-20 DIAGNOSIS — E1159 Type 2 diabetes mellitus with other circulatory complications: Secondary | ICD-10-CM | POA: Diagnosis not present

## 2022-04-20 DIAGNOSIS — E1129 Type 2 diabetes mellitus with other diabetic kidney complication: Secondary | ICD-10-CM | POA: Diagnosis not present

## 2022-04-20 DIAGNOSIS — E1169 Type 2 diabetes mellitus with other specified complication: Secondary | ICD-10-CM | POA: Diagnosis not present

## 2022-04-20 DIAGNOSIS — I152 Hypertension secondary to endocrine disorders: Secondary | ICD-10-CM | POA: Diagnosis not present

## 2022-04-20 DIAGNOSIS — R809 Proteinuria, unspecified: Secondary | ICD-10-CM | POA: Diagnosis not present

## 2022-04-20 DIAGNOSIS — E785 Hyperlipidemia, unspecified: Secondary | ICD-10-CM | POA: Diagnosis not present

## 2022-04-20 DIAGNOSIS — E538 Deficiency of other specified B group vitamins: Secondary | ICD-10-CM | POA: Diagnosis not present

## 2022-04-20 LAB — HEMOGLOBIN A1C: Hemoglobin A1C: 6.4

## 2022-05-04 ENCOUNTER — Encounter: Payer: Self-pay | Admitting: Family Medicine

## 2022-05-04 ENCOUNTER — Ambulatory Visit (INDEPENDENT_AMBULATORY_CARE_PROVIDER_SITE_OTHER): Payer: Medicare HMO | Admitting: Family Medicine

## 2022-05-04 VITALS — BP 130/78 | HR 76 | Ht 61.0 in | Wt 132.0 lb

## 2022-05-04 DIAGNOSIS — S42202A Unspecified fracture of upper end of left humerus, initial encounter for closed fracture: Secondary | ICD-10-CM | POA: Diagnosis not present

## 2022-05-04 DIAGNOSIS — E785 Hyperlipidemia, unspecified: Secondary | ICD-10-CM | POA: Diagnosis not present

## 2022-05-04 DIAGNOSIS — R351 Nocturia: Secondary | ICD-10-CM

## 2022-05-04 DIAGNOSIS — J301 Allergic rhinitis due to pollen: Secondary | ICD-10-CM | POA: Diagnosis not present

## 2022-05-04 DIAGNOSIS — I10 Essential (primary) hypertension: Secondary | ICD-10-CM

## 2022-05-04 DIAGNOSIS — M0600 Rheumatoid arthritis without rheumatoid factor, unspecified site: Secondary | ICD-10-CM | POA: Diagnosis not present

## 2022-05-04 MED ORDER — ATORVASTATIN CALCIUM 40 MG PO TABS: ORAL_TABLET | ORAL | 1 refills | Status: DC

## 2022-05-04 MED ORDER — METOPROLOL TARTRATE 50 MG PO TABS: ORAL_TABLET | ORAL | 1 refills | Status: DC

## 2022-05-04 MED ORDER — MONTELUKAST SODIUM 10 MG PO TABS: 10.0000 mg | ORAL_TABLET | Freq: Every day | ORAL | 0 refills | Status: DC

## 2022-05-04 MED ORDER — GEMFIBROZIL 600 MG PO TABS: ORAL_TABLET | ORAL | 1 refills | Status: DC

## 2022-05-04 MED ORDER — AMLODIPINE BESYLATE 2.5 MG PO TABS: ORAL_TABLET | ORAL | 1 refills | Status: DC

## 2022-05-04 NOTE — Progress Notes (Signed)
Date:  05/04/2022   Name:  Judy Bolton   DOB:  [birth year]   MRN:  [id number]   Chief Complaint: Hypertension, Hyperlipidemia, and Allergic Rhinitis   Hypertension This is a chronic problem. The current episode started more than 1 year ago. The problem has been rapidly improving since onset. The problem is controlled. Pertinent negatives include no anxiety, blurred vision, chest pain, headaches, malaise/fatigue, neck pain, orthopnea, palpitations, peripheral edema, PND, shortness of breath or sweats. There are no associated agents to hypertension. There are no known risk factors for coronary artery disease. Past treatments include beta blockers, diuretics and calcium channel blockers. There are no compliance problems.  There is no history of angina, kidney disease, CAD/MI, CVA, heart failure, left ventricular hypertrophy, PVD or retinopathy. There is no history of chronic renal disease, a hypertension causing med or renovascular disease.  Hyperlipidemia This is a chronic problem. The current episode started more than 1 year ago. The problem is controlled. Recent lipid tests were reviewed and are normal. She has no history of chronic renal disease, diabetes, hypothyroidism, liver disease, obesity or nephrotic syndrome. Factors aggravating her hyperlipidemia include thiazides. Pertinent negatives include no chest pain, myalgias or shortness of breath. Current antihyperlipidemic treatment includes statins. The current treatment provides moderate improvement of lipids. There are no compliance problems.  Risk factors for coronary artery disease include dyslipidemia and hypertension.  URI  This is a recurrent (for allergic rhintis) problem. The problem has been gradually improving. Pertinent negatives include no abdominal pain, chest pain, coughing, diarrhea, dysuria, ear pain, headaches, nausea, neck pain, rash, sore throat or wheezing. She has tried nothing for the symptoms. The treatment provided  mild relief.   Lab Results  Component Value Date   NA 141 10/12/2021   K 4.6 10/12/2021   CO2 24 10/12/2021   GLUCOSE 109 (H) 10/12/2021   BUN 27 10/12/2021   CREATININE 1.27 (H) 10/12/2021   CALCIUM 10.0 10/12/2021   EGFR 45 (L) 10/12/2021   GFRNONAA 40 (L) 09/12/2020   Lab Results  Component Value Date   CHOL 179 10/12/2021   HDL 53 10/12/2021   LDLCALC 103 (H) 10/12/2021   TRIG 128 10/12/2021   CHOLHDL 2.8 01/24/2018   No results found for: TSH Lab Results  Component Value Date   HGBA1C 6.4 04/20/2022   Lab Results  Component Value Date   WBC 9.0 11/10/2021   HGB 12.5 11/10/2021   HCT 37.9 11/10/2021   MCV 85 11/10/2021   PLT 255 11/10/2021   Lab Results  Component Value Date   ALT 11 11/10/2021   AST 19 11/10/2021   ALKPHOS 83 11/10/2021   BILITOT <0.2 11/10/2021   No results found for: 25OHVITD2, 25OHVITD3, VD25OH   Review of Systems  Constitutional:  Negative for chills, fever and malaise/fatigue.  HENT:  Negative for drooling, ear discharge, ear pain and sore throat.   Eyes:  Negative for blurred vision.  Respiratory:  Negative for cough, shortness of breath and wheezing.   Cardiovascular:  Negative for chest pain, palpitations, orthopnea, leg swelling and PND.  Gastrointestinal:  Negative for abdominal pain, blood in stool, constipation, diarrhea and nausea.  Endocrine: Negative for polydipsia.  Genitourinary:  Negative for dysuria, frequency, hematuria and urgency.  Musculoskeletal:  Negative for back pain, myalgias and neck pain.  Skin:  Negative for rash.  Allergic/Immunologic: Negative for environmental allergies.  Neurological:  Negative for dizziness and headaches.  Hematological:  Does not bruise/bleed easily.  Psychiatric/Behavioral:  Negative for suicidal ideas. The patient is not nervous/anxious.    Patient Active Problem List   Diagnosis Date Noted   Primary osteoarthritis of right hip 08/21/2021   Essential hypertension 08/30/2017    Gastroesophageal reflux disease 08/30/2017   Aortic atherosclerosis ([place]) 05/31/2017   PAD (peripheral artery disease) ([place]) 05/31/2017   Encounter for long-term (current) use of high-risk medication 12/28/2016   Seronegative rheumatoid arthritis ([place]) 12/28/2016   Bilateral hand pain 11/08/2016   Elevated C-reactive protein 11/08/2016   Swelling of joint of right hand 11/08/2016   Type 2 diabetes mellitus with microalbuminuria, without long-term current use of insulin ([place]) 07/06/2016   Acute anxiety 04/16/2016   Bronchitis 04/16/2016   Centrilobular emphysema ([place]) 04/16/2016   TIA (transient ischemic attack) 02/29/2016   Lumbar stenosis with neurogenic claudication 04/16/2015   Spondylosis of lumbar region without myelopathy or radiculopathy 04/16/2015   Hyperlipidemia 10/15/2014    Allergies  Allergen Reactions   Rosuvastatin Swelling    Neck swelling   Betadine [Povidone Iodine] Swelling   Codeine Itching   Cyclobenzaprine     Other reaction(s): Dizziness   Latex Itching and Swelling    (urinary catheter)   Losartan     intolerance   Meloxicam Swelling   Penicillins Other (See Comments)    Paralysis  Has patient had a PCN reaction causing immediate rash, facial/tongue/throat swelling, SOB or lightheadedness with hypotension: No Has patient had a PCN reaction causing severe rash involving mucus membranes or skin necrosis: No Has patient had a PCN reaction that required hospitalization Yes Has patient had a PCN reaction occurring within the last 10 years: No If all of the above answers are "NO", then may proceed with Cephalosporin use.   Valsartan     Past Surgical History:  Procedure Laterality Date   ABDOMINAL HYSTERECTOMY     ANTERIOR (CYSTOCELE) AND POSTERIOR REPAIR (RECTOCELE) WITH XENFORM GRAFT AND SACROSPINOUS FIXATION     APPENDECTOMY     BROW LIFT Bilateral 09/05/2018   Procedure: BLEPHAROPLASTY UPPER EYELID W/EXCESS SKIN;  Surgeon: Karle Starch, MD;   Location: [place];  Service: Ophthalmology;  Laterality: Bilateral;   CATARACT EXTRACTION W/PHACO Left 01/06/2022   Procedure: CATARACT EXTRACTION PHACO AND INTRAOCULAR LENS PLACEMENT (IOC) LEFT DIABETIC 8.09 01:07.3;  Surgeon: Leandrew Koyanagi, MD;  Location: [place];  Service: Ophthalmology;  Laterality: Left;   CATARACT EXTRACTION W/PHACO Right 01/20/2022   Procedure: CATARACT EXTRACTION PHACO AND INTRAOCULAR LENS PLACEMENT ([place]) RIGHT DIABETIC;  Surgeon: Leandrew Koyanagi, MD;  Location: [place];  Service: Ophthalmology;  Laterality: Right;  6.03 01:02.2   CTR Bilateral    LUMBAR LAMINECTOMY/DECOMPRESSION MICRODISCECTOMY N/A 07/27/2017   Procedure: LUMBAR LAMINECTOMY/DECOMPRESSION MICRODISCECTOMY 3 LEVELS-L3-S1;  Surgeon: Meade Maw, MD;  Location: [facility];  Service: Neurosurgery;  Laterality: N/A;   PTOSIS REPAIR Bilateral 09/05/2018   Procedure: PTOSIS REPAIR RESECT EX;  Surgeon: Karle Starch, MD;  Location: [place];  Service: Ophthalmology;  Laterality: Bilateral;  Diabetic - oral meds sleep apnea Latexd allergy   ROTATOR CUFF REPAIR Right     Social History   Tobacco Use   Smoking status: Former    Packs/day: 1.50    Years: 28.00    Pack years: 42.00    Types: Cigarettes    Quit date: 08/14/2000    Years since quitting: 21.7   Smokeless tobacco: Never   Tobacco comments:    smoking cessatiuon materials not required  Vaping Use   Vaping Use: Never used  Substance Use Topics   Alcohol use: No   Drug use: No     Medication list has been reviewed and updated.  Current Meds  Medication Sig   albuterol (PROVENTIL HFA;VENTOLIN HFA) 108 (90 Base) MCG/ACT inhaler Inhale 2 puffs into the lungs every 6 (six) hours as needed for wheezing or shortness of breath.   albuterol (PROVENTIL) (2.5 MG/3ML) 0.083% nebulizer solution Take 3 mLs (2.5 mg total) by nebulization every 6 (six) hours as needed for wheezing or shortness of  breath.   amLODipine (NORVASC) 2.5 MG tablet TAKE 1 TABLET(2.5 MG) BY MOUTH DAILY   atorvastatin (LIPITOR) 40 MG tablet Take one by mouth daily.   celecoxib (CELEBREX) 200 MG capsule Take 1 capsule by mouth daily.   cholecalciferol (VITAMIN D3) 25 MCG (1000 UNIT) tablet Take 1,000 Units by mouth daily.   FASENRA 30 MG/ML SOSY Once every 2 months for asthma. Paid for by [employer]   gabapentin (NEURONTIN) 300 MG capsule Take 1 capsule by mouth daily.   gemfibrozil (LOPID) 600 MG tablet TAKE 1 TABLET TWICE DAILY   hydrochlorothiazide (HYDRODIURIL) 25 MG tablet TAKE 1 TABLET (25 MG TOTAL) BY MOUTH DAILY.   ipratropium (ATROVENT) 0.02 % nebulizer solution Inhale into the lungs.   metFORMIN (GLUCOPHAGE) 500 MG tablet Take 500 mg by mouth 2 (two) times daily.   metoprolol tartrate (LOPRESSOR) 50 MG tablet TAKE 1 TABLET TWICE DAILY   montelukast (SINGULAIR) 10 MG tablet TAKE 1 TABLET (10 MG TOTAL) BY MOUTH DAILY.   pantoprazole (PROTONIX) 40 MG tablet Take 1 tablet (40 mg total) by mouth daily.   vitamin B-12 (CYANOCOBALAMIN) 1000 MCG tablet Take 1,000 mcg by mouth daily.       05/04/2022    1:18 PM 10/12/2021    8:15 AM 01/29/2021    3:13 PM 07/31/2020    9:40 AM  GAD 7 : Generalized Anxiety Score  Nervous, Anxious, on Edge 0 0 0 0  Control/stop worrying 0 0 0 0  Worry too much - different things 0 0 0 0  Trouble relaxing 0 0 0 0  Restless 0 0 0 0  Easily annoyed or irritable 0 0 0 0  Afraid - awful might happen 0 0 0 0  Total GAD 7 Score 0 0 0 0  Anxiety Difficulty Not difficult at all          05/04/2022    1:18 PM  Depression screen PHQ 2/9  Decreased Interest 0  Down, Depressed, Hopeless 0  PHQ - 2 Score 0  Altered sleeping 0  Tired, decreased energy 0  Change in appetite 0  Feeling bad or failure about yourself  0  Trouble concentrating 0  Moving slowly or fidgety/restless 0  Suicidal thoughts 0  PHQ-9 Score 0  Difficult doing work/chores Not difficult at all    BP  Readings from Last 3 Encounters:  05/04/22 130/78  01/20/22 (!) 144/84  01/06/22 (!) 160/76    Physical Exam Vitals and nursing note reviewed.  HENT:     Right Ear: Tympanic membrane and ear canal normal.     Left Ear: Tympanic membrane and ear canal normal.     Nose: No congestion or rhinorrhea.  Cardiovascular:     Pulses: Normal pulses.     Heart sounds: S1 normal and S2 normal. No murmur heard. No systolic murmur is present.  No diastolic murmur is present.    No friction rub. No gallop. No S3 or S4 sounds.  Pulmonary:  Breath sounds: No wheezing or rhonchi.    Wt Readings from Last 3 Encounters:  05/04/22 132 lb (59.9 kg)  01/20/22 133 lb (60.3 kg)  01/06/22 133 lb 6.4 oz (60.5 kg)    BP 130/78   Pulse 76   Ht 5' 1"  (1.549 m)   Wt 132 lb (59.9 kg)   BMI 24.94 kg/m   Assessment and Plan:  1. Essential hypertension Chronic.  Controlled.  Stable.  Blood pressure today is excellent at 130/78.  Continue amlodipine 2.5 mg once a day and metoprolol 50 mg once a day.  Because of nocturia patient would like to have a trial of no diuretic to see if this will help although I am more concerned that multiple medications have been discontinued concerning her diabetes and whether or not we are looking at nocturia secondary to hyperglycemia. - amLODipine (NORVASC) 2.5 MG tablet; TAKE 1 TABLET(2.5 MG) BY MOUTH DAILY  Dispense: 90 tablet; Refill: 1 - metoprolol tartrate (LOPRESSOR) 50 MG tablet; TAKE 1 TABLET TWICE DAILY  Dispense: 180 tablet; Refill: 1 - Comprehensive Metabolic Panel (CMET)  2. Hyperlipidemia, unspecified hyperlipidemia type Right.  Controlled.  Stable.  Continue atorvastatin 40 mg once a day and gemfibrozil 600 mg twice a day.  We will check lipid panel for current status. - atorvastatin (LIPITOR) 40 MG tablet; Take one by mouth daily.  Dispense: 90 tablet; Refill: 1 - gemfibrozil (LOPID) 600 MG tablet; TAKE 1 TABLET TWICE DAILY  Dispense: 180 tablet; Refill:  1 - Lipid Panel With LDL/HDL Ratio  3. Seasonal allergic rhinitis due to pollen Chronic.  Controlled.  Stable.  Continue Singulair 10 mg once a day. - montelukast (SINGULAIR) 10 MG tablet; Take 1 tablet (10 mg total) by mouth daily.  Dispense: 90 tablet; Refill: 0  4. Nocturia As noted above patient is really concerned about having to get up several times during the night to urinate with a fair amount of urine each time this sounds more polyuria but she says her blood sugars are not well controlled range when she checks them in a fasting state.  It is too early to do an A1c and we will discontinue hydrochlorothiazide at this time to see if this may help but if swelling becomes an issue she may take the hydrochlorothiazide on a as needed basis.

## 2022-05-05 ENCOUNTER — Other Ambulatory Visit: Payer: Self-pay | Admitting: Family Medicine

## 2022-05-05 DIAGNOSIS — R1011 Right upper quadrant pain: Secondary | ICD-10-CM

## 2022-05-05 LAB — COMPREHENSIVE METABOLIC PANEL
ALT: 9 IU/L (ref 0–32)
AST: 17 IU/L (ref 0–40)
Albumin/Globulin Ratio: 2.1 (ref 1.2–2.2)
Albumin: 4.5 g/dL (ref 3.7–4.7)
Alkaline Phosphatase: 160 IU/L — ABNORMAL HIGH (ref 44–121)
BUN/Creatinine Ratio: 18 (ref 12–28)
BUN: 21 mg/dL (ref 8–27)
Bilirubin Total: 0.3 mg/dL (ref 0.0–1.2)
CO2: 22 mmol/L (ref 20–29)
Calcium: 9.9 mg/dL (ref 8.7–10.3)
Chloride: 101 mmol/L (ref 96–106)
Creatinine, Ser: 1.16 mg/dL — ABNORMAL HIGH (ref 0.57–1.00)
Globulin, Total: 2.1 g/dL (ref 1.5–4.5)
Glucose: 114 mg/dL — ABNORMAL HIGH (ref 70–99)
Potassium: 4.3 mmol/L (ref 3.5–5.2)
Sodium: 141 mmol/L (ref 134–144)
Total Protein: 6.6 g/dL (ref 6.0–8.5)
eGFR: 50 mL/min/{1.73_m2} — ABNORMAL LOW (ref >59)

## 2022-05-05 LAB — LIPID PANEL WITH LDL/HDL RATIO
Cholesterol, Total: 172 mg/dL (ref 100–199)
HDL: 64 mg/dL (ref >39)
LDL Chol Calc (NIH): 75 mg/dL (ref 0–99)
LDL/HDL Ratio: 1.2 ratio (ref 0.0–3.2)
Triglycerides: 203 mg/dL — ABNORMAL HIGH (ref 0–149)
VLDL Cholesterol Cal: 33 mg/dL (ref 5–40)

## 2022-05-18 DIAGNOSIS — S42202D Unspecified fracture of upper end of left humerus, subsequent encounter for fracture with routine healing: Secondary | ICD-10-CM | POA: Diagnosis not present

## 2022-06-15 ENCOUNTER — Ambulatory Visit: Payer: Medicare HMO | Admitting: Family Medicine

## 2022-06-21 ENCOUNTER — Ambulatory Visit (INDEPENDENT_AMBULATORY_CARE_PROVIDER_SITE_OTHER): Payer: Medicare HMO | Admitting: Family Medicine

## 2022-06-21 ENCOUNTER — Encounter: Payer: Self-pay | Admitting: Family Medicine

## 2022-06-21 ENCOUNTER — Other Ambulatory Visit: Payer: Self-pay | Admitting: Family Medicine

## 2022-06-21 VITALS — BP 120/80 | HR 64 | Ht 61.0 in | Wt 133.0 lb

## 2022-06-21 DIAGNOSIS — B353 Tinea pedis: Secondary | ICD-10-CM | POA: Diagnosis not present

## 2022-06-21 DIAGNOSIS — L309 Dermatitis, unspecified: Secondary | ICD-10-CM

## 2022-06-21 DIAGNOSIS — B356 Tinea cruris: Secondary | ICD-10-CM

## 2022-06-21 DIAGNOSIS — R1011 Right upper quadrant pain: Secondary | ICD-10-CM

## 2022-06-21 MED ORDER — FLUCONAZOLE 150 MG PO TABS: 150.0000 mg | ORAL_TABLET | Freq: Once | ORAL | 0 refills | Status: AC

## 2022-06-21 MED ORDER — NYSTATIN 100000 UNIT/GM EX CREA: 1.0000 | TOPICAL_CREAM | Freq: Two times a day (BID) | CUTANEOUS | 2 refills | Status: DC

## 2022-06-21 MED ORDER — CLOTRIMAZOLE-BETAMETHASONE 1-0.05 % EX CREA: 1.0000 | TOPICAL_CREAM | Freq: Every day | CUTANEOUS | 0 refills | Status: DC

## 2022-06-21 NOTE — Progress Notes (Signed)
Date:  06/21/2022   Name:  Judy Bolton   DOB:  Jun 06, 1946   MRN:  [id number]   Chief Complaint: Rash (Needs Nystatin cream- gets yeast after mowing for long periods in skin folds. AND feet are itching with a rash)  Rash This is a new problem. The current episode started 1 to 4 weeks ago. The problem has been gradually worsening since onset. The affected locations include the right foot and left foot. The rash is characterized by redness and itchiness. Pertinent negatives include no anorexia, congestion, cough, diarrhea, eye pain, facial edema, fatigue, fever, joint pain, nail changes, rhinorrhea, shortness of breath, sore throat or vomiting. Past treatments include nothing. Her past medical history is significant for eczema.    Lab Results  Component Value Date   NA 141 05/04/2022   K 4.3 05/04/2022   CO2 22 05/04/2022   GLUCOSE 114 (H) 05/04/2022   BUN 21 05/04/2022   CREATININE 1.16 (H) 05/04/2022   CALCIUM 9.9 05/04/2022   EGFR 50 (L) 05/04/2022   GFRNONAA 40 (L) 09/12/2020   Lab Results  Component Value Date   CHOL 172 05/04/2022   HDL 64 05/04/2022   LDLCALC 75 05/04/2022   TRIG 203 (H) 05/04/2022   CHOLHDL 2.8 01/24/2018   No results found for: "TSH" Lab Results  Component Value Date   HGBA1C 6.4 04/20/2022   Lab Results  Component Value Date   WBC 9.0 11/10/2021   HGB 12.5 11/10/2021   HCT 37.9 11/10/2021   MCV 85 11/10/2021   PLT 255 11/10/2021   Lab Results  Component Value Date   ALT 9 05/04/2022   AST 17 05/04/2022   ALKPHOS 160 (H) 05/04/2022   BILITOT 0.3 05/04/2022   No results found for: "25OHVITD2", "25OHVITD3", "VD25OH"   Review of Systems  Constitutional:  Negative for fatigue and fever.  HENT:  Negative for congestion, rhinorrhea and sore throat.   Eyes:  Negative for pain.  Respiratory:  Negative for cough and shortness of breath.   Gastrointestinal:  Negative for anorexia, diarrhea and vomiting.  Genitourinary:  Positive for  vaginal discharge.  Musculoskeletal:  Negative for joint pain.  Skin:  Positive for rash. Negative for nail changes.    Patient Active Problem List   Diagnosis Date Noted   Primary osteoarthritis of right hip 08/21/2021   Essential hypertension 08/30/2017   Gastroesophageal reflux disease 08/30/2017   Aortic atherosclerosis ([place]) 05/31/2017   PAD (peripheral artery disease) ([place]) 05/31/2017   Encounter for long-term (current) use of high-risk medication 12/28/2016   Seronegative rheumatoid arthritis ([place]) 12/28/2016   Bilateral hand pain 11/08/2016   Elevated C-reactive protein 11/08/2016   Swelling of joint of right hand 11/08/2016   Type 2 diabetes mellitus with microalbuminuria, without long-term current use of insulin ([place]) 07/06/2016   Acute anxiety 04/16/2016   Bronchitis 04/16/2016   Centrilobular emphysema ([place]) 04/16/2016   TIA (transient ischemic attack) 02/29/2016   Lumbar stenosis with neurogenic claudication 04/16/2015   Spondylosis of lumbar region without myelopathy or radiculopathy 04/16/2015   Hyperlipidemia 10/15/2014    Allergies  Allergen Reactions   Rosuvastatin Swelling    Neck swelling   Betadine [Povidone Iodine] Swelling   Codeine Itching   Cyclobenzaprine     Other reaction(s): Dizziness   Latex Itching and Swelling    (urinary catheter)   Losartan     intolerance   Meloxicam Swelling   Penicillins Other (See Comments)    Paralysis  Has patient  had a PCN reaction causing immediate rash, facial/tongue/throat swelling, SOB or lightheadedness with hypotension: No Has patient had a PCN reaction causing severe rash involving mucus membranes or skin necrosis: No Has patient had a PCN reaction that required hospitalization Yes Has patient had a PCN reaction occurring within the last 10 years: No If all of the above answers are "NO", then may proceed with Cephalosporin use.   Valsartan     Past Surgical History:  Procedure Laterality Date    ABDOMINAL HYSTERECTOMY     ANTERIOR (CYSTOCELE) AND POSTERIOR REPAIR (RECTOCELE) WITH XENFORM GRAFT AND SACROSPINOUS FIXATION     APPENDECTOMY     BROW LIFT Bilateral 09/05/2018   Procedure: BLEPHAROPLASTY UPPER EYELID W/EXCESS SKIN;  Surgeon: Karle Starch, MD;  Location: [facility];  Service: Ophthalmology;  Laterality: Bilateral;   CATARACT EXTRACTION W/PHACO Left 01/06/2022   Procedure: CATARACT EXTRACTION PHACO AND INTRAOCULAR LENS PLACEMENT (IOC) LEFT DIABETIC 8.09 01:07.3;  Surgeon: Leandrew Koyanagi, MD;  Location: [facility];  Service: Ophthalmology;  Laterality: Left;   CATARACT EXTRACTION W/PHACO Right 01/20/2022   Procedure: CATARACT EXTRACTION PHACO AND INTRAOCULAR LENS PLACEMENT ([place]) RIGHT DIABETIC;  Surgeon: Leandrew Koyanagi, MD;  Location: [facility];  Service: Ophthalmology;  Laterality: Right;  6.03 01:02.2   CTR Bilateral    LUMBAR LAMINECTOMY/DECOMPRESSION MICRODISCECTOMY N/A 07/27/2017   Procedure: LUMBAR LAMINECTOMY/DECOMPRESSION MICRODISCECTOMY 3 LEVELS-L3-S1;  Surgeon: Meade Maw, MD;  Location: [facility];  Service: Neurosurgery;  Laterality: N/A;   PTOSIS REPAIR Bilateral 09/05/2018   Procedure: PTOSIS REPAIR RESECT EX;  Surgeon: Karle Starch, MD;  Location: [facility];  Service: Ophthalmology;  Laterality: Bilateral;  Diabetic - oral meds sleep apnea Latexd allergy   ROTATOR CUFF REPAIR Right     Social History   Tobacco Use   Smoking status: Former    Packs/day: 1.50    Years: 28.00    Total pack years: 42.00    Types: Cigarettes    Quit date: 08/14/2000    Years since quitting: 21.8   Smokeless tobacco: Never   Tobacco comments:    smoking cessatiuon materials not required  Vaping Use   Vaping Use: Never used  Substance Use Topics   Alcohol use: No   Drug use: No     Medication list has been reviewed and updated.  Current Meds  Medication Sig   albuterol (PROVENTIL HFA;VENTOLIN HFA) 108 (90 Base)  MCG/ACT inhaler Inhale 2 puffs into the lungs every 6 (six) hours as needed for wheezing or shortness of breath.   albuterol (PROVENTIL) (2.5 MG/3ML) 0.083% nebulizer solution Take 3 mLs (2.5 mg total) by nebulization every 6 (six) hours as needed for wheezing or shortness of breath.   amLODipine (NORVASC) 2.5 MG tablet TAKE 1 TABLET(2.5 MG) BY MOUTH DAILY   atorvastatin (LIPITOR) 40 MG tablet Take one by mouth daily.   celecoxib (CELEBREX) 200 MG capsule Take 1 capsule by mouth daily.   cholecalciferol (VITAMIN D3) 25 MCG (1000 UNIT) tablet Take 1,000 Units by mouth daily.   FASENRA 30 MG/ML SOSY Once every 2 months for asthma. Paid for by [employer]   gabapentin (NEURONTIN) 300 MG capsule Take 1 capsule by mouth daily.   gemfibrozil (LOPID) 600 MG tablet TAKE 1 TABLET TWICE DAILY   hydrochlorothiazide (HYDRODIURIL) 25 MG tablet TAKE 1 TABLET (25 MG TOTAL) BY MOUTH DAILY.   ipratropium (ATROVENT) 0.02 % nebulizer solution Inhale into the lungs.   metFORMIN (GLUCOPHAGE) 500 MG tablet Take 500 mg by mouth  2 (two) times daily.   metoprolol tartrate (LOPRESSOR) 50 MG tablet TAKE 1 TABLET TWICE DAILY   montelukast (SINGULAIR) 10 MG tablet Take 1 tablet (10 mg total) by mouth daily.   pantoprazole (PROTONIX) 40 MG tablet TAKE 1 TABLET BY MOUTH EVERY DAY   vitamin B-12 (CYANOCOBALAMIN) 1000 MCG tablet Take 1,000 mcg by mouth daily.       06/21/2022    1:40 PM 05/04/2022    1:18 PM 10/12/2021    8:15 AM 01/29/2021    3:13 PM  GAD 7 : Generalized Anxiety Score  Nervous, Anxious, on Edge 0 0 0 0  Control/stop worrying 0 0 0 0  Worry too much - different things 1 0 0 0  Trouble relaxing 1 0 0 0  Restless 0 0 0 0  Easily annoyed or irritable 0 0 0 0  Afraid - awful might happen 0 0 0 0  Total GAD 7 Score 2 0 0 0  Anxiety Difficulty Not difficult at all Not difficult at all         06/21/2022    1:40 PM 05/04/2022    1:18 PM 03/29/2022    1:54 PM  Depression screen PHQ 2/9  Decreased Interest  0 0 0  Down, Depressed, Hopeless 0 0 0  PHQ - 2 Score 0 0 0  Altered sleeping 0 0   Tired, decreased energy 1 0   Change in appetite 1 0   Feeling bad or failure about yourself  0 0   Trouble concentrating 0 0   Moving slowly or fidgety/restless 0 0   Suicidal thoughts 0 0   PHQ-9 Score 2 0   Difficult doing work/chores Not difficult at all Not difficult at all     BP Readings from Last 3 Encounters:  06/21/22 120/80  05/04/22 130/78  01/20/22 (!) 144/84    Physical Exam Vitals and nursing note reviewed.  HENT:     Right Ear: Tympanic membrane normal.     Left Ear: Tympanic membrane normal.     Nose: Nose normal.  Pulmonary:     Breath sounds: No wheezing.  Genitourinary:    Labia:        Right: Rash present.        Left: Rash present.   Skin:    General: Skin is warm.     Findings: Erythema present.     Wt Readings from Last 3 Encounters:  06/21/22 133 lb (60.3 kg)  05/04/22 132 lb (59.9 kg)  01/20/22 133 lb (60.3 kg)    BP 120/80   Pulse 64   Ht 5' 1"  (1.549 m)   Wt 133 lb (60.3 kg)   BMI 25.13 kg/m   Assessment and Plan:   1. Eczema, unspecified type 1 of 2 rashes this involves both feet with a area of erythema with a demarcated area coming of the posterior aspect of both ankles.  Involving both feet.  This has characteristics of eczema and perhaps a tenia pedis and for this we will going to use a combination of clotrimazole and betamethasone as noted below - Ambulatory referral to Dermatology  2. Tinea pedis of both feet Patient's been trying to mow in warm weather and has had a breakout.  I think this may be some dermatitis involved but likely there is a tenia component and we will use Lotrisone cream for application here. - clotrimazole-betamethasone (LOTRISONE) cream; Apply 1 Application topically daily. To feet  Dispense: 30 g; Refill: 0 -  Ambulatory referral to Dermatology  3. Tinea inguinalis The other rash involves the inguinal  Vaginal  area is also associated with heat sweating that she is mowing and I told her that this is likely to continue as long as she places herself given her diabetes in this situation.  For this we will treat with a combination of Diflucan 150 today repeat in 1 week and nystatin cream to be applied.  Vaginally on a twice a day basis.  In the meantime we will put in for dermatology consult if either of the rashes is not resolved. - fluconazole (DIFLUCAN) 150 MG tablet; Take 1 tablet (150 mg total) by mouth once for 1 dose.  Dispense: 2 tablet; Refill: 0 - Ambulatory referral to Dermatology - nystatin cream (MYCOSTATIN); Apply 1 Application topically 2 (two) times daily.  Dispense: 30 g; Refill: 2

## 2022-06-22 DIAGNOSIS — J449 Chronic obstructive pulmonary disease, unspecified: Secondary | ICD-10-CM | POA: Diagnosis not present

## 2022-07-07 DIAGNOSIS — R809 Proteinuria, unspecified: Secondary | ICD-10-CM | POA: Diagnosis not present

## 2022-07-07 DIAGNOSIS — M1712 Unilateral primary osteoarthritis, left knee: Secondary | ICD-10-CM | POA: Diagnosis not present

## 2022-07-07 DIAGNOSIS — M170 Bilateral primary osteoarthritis of knee: Secondary | ICD-10-CM | POA: Diagnosis not present

## 2022-07-07 DIAGNOSIS — E1129 Type 2 diabetes mellitus with other diabetic kidney complication: Secondary | ICD-10-CM | POA: Diagnosis not present

## 2022-07-07 DIAGNOSIS — M1711 Unilateral primary osteoarthritis, right knee: Secondary | ICD-10-CM | POA: Diagnosis not present

## 2022-07-19 ENCOUNTER — Other Ambulatory Visit: Payer: Self-pay | Admitting: Family Medicine

## 2022-07-19 DIAGNOSIS — J301 Allergic rhinitis due to pollen: Secondary | ICD-10-CM

## 2022-07-25 DIAGNOSIS — J449 Chronic obstructive pulmonary disease, unspecified: Secondary | ICD-10-CM | POA: Diagnosis not present

## 2022-08-06 DIAGNOSIS — M170 Bilateral primary osteoarthritis of knee: Secondary | ICD-10-CM | POA: Diagnosis not present

## 2022-08-13 DIAGNOSIS — M1711 Unilateral primary osteoarthritis, right knee: Secondary | ICD-10-CM | POA: Diagnosis not present

## 2022-08-13 DIAGNOSIS — M170 Bilateral primary osteoarthritis of knee: Secondary | ICD-10-CM | POA: Diagnosis not present

## 2022-08-13 DIAGNOSIS — M1712 Unilateral primary osteoarthritis, left knee: Secondary | ICD-10-CM | POA: Diagnosis not present

## 2022-08-13 DIAGNOSIS — Z961 Presence of intraocular lens: Secondary | ICD-10-CM | POA: Diagnosis not present

## 2022-08-13 DIAGNOSIS — E113293 Type 2 diabetes mellitus with mild nonproliferative diabetic retinopathy without macular edema, bilateral: Secondary | ICD-10-CM | POA: Diagnosis not present

## 2022-08-13 DIAGNOSIS — Z0100 Encounter for examination of eyes and vision without abnormal findings: Secondary | ICD-10-CM | POA: Diagnosis not present

## 2022-08-13 LAB — HM DIABETES EYE EXAM

## 2022-08-18 DIAGNOSIS — E1129 Type 2 diabetes mellitus with other diabetic kidney complication: Secondary | ICD-10-CM | POA: Diagnosis not present

## 2022-08-18 DIAGNOSIS — I152 Hypertension secondary to endocrine disorders: Secondary | ICD-10-CM | POA: Diagnosis not present

## 2022-08-18 DIAGNOSIS — E1169 Type 2 diabetes mellitus with other specified complication: Secondary | ICD-10-CM | POA: Diagnosis not present

## 2022-08-18 DIAGNOSIS — R809 Proteinuria, unspecified: Secondary | ICD-10-CM | POA: Diagnosis not present

## 2022-08-18 DIAGNOSIS — E538 Deficiency of other specified B group vitamins: Secondary | ICD-10-CM | POA: Diagnosis not present

## 2022-08-18 DIAGNOSIS — E1159 Type 2 diabetes mellitus with other circulatory complications: Secondary | ICD-10-CM | POA: Diagnosis not present

## 2022-08-18 DIAGNOSIS — E785 Hyperlipidemia, unspecified: Secondary | ICD-10-CM | POA: Diagnosis not present

## 2022-08-20 DIAGNOSIS — M170 Bilateral primary osteoarthritis of knee: Secondary | ICD-10-CM | POA: Diagnosis not present

## 2022-08-24 ENCOUNTER — Ambulatory Visit (INDEPENDENT_AMBULATORY_CARE_PROVIDER_SITE_OTHER): Payer: Medicare HMO

## 2022-08-24 DIAGNOSIS — Z23 Encounter for immunization: Secondary | ICD-10-CM

## 2022-08-25 DIAGNOSIS — E538 Deficiency of other specified B group vitamins: Secondary | ICD-10-CM | POA: Diagnosis not present

## 2022-08-25 DIAGNOSIS — E1159 Type 2 diabetes mellitus with other circulatory complications: Secondary | ICD-10-CM | POA: Diagnosis not present

## 2022-08-25 DIAGNOSIS — E785 Hyperlipidemia, unspecified: Secondary | ICD-10-CM | POA: Diagnosis not present

## 2022-08-25 DIAGNOSIS — E1129 Type 2 diabetes mellitus with other diabetic kidney complication: Secondary | ICD-10-CM | POA: Diagnosis not present

## 2022-08-25 DIAGNOSIS — E1169 Type 2 diabetes mellitus with other specified complication: Secondary | ICD-10-CM | POA: Diagnosis not present

## 2022-08-25 DIAGNOSIS — R809 Proteinuria, unspecified: Secondary | ICD-10-CM | POA: Diagnosis not present

## 2022-08-25 DIAGNOSIS — I152 Hypertension secondary to endocrine disorders: Secondary | ICD-10-CM | POA: Diagnosis not present

## 2022-08-25 DIAGNOSIS — J449 Chronic obstructive pulmonary disease, unspecified: Secondary | ICD-10-CM | POA: Diagnosis not present

## 2022-08-25 LAB — MICROALBUMIN / CREATININE URINE RATIO: Microalb Creat Ratio: 3401.8

## 2022-09-06 ENCOUNTER — Other Ambulatory Visit: Payer: Self-pay | Admitting: Family Medicine

## 2022-09-06 DIAGNOSIS — I10 Essential (primary) hypertension: Secondary | ICD-10-CM

## 2022-09-07 NOTE — Telephone Encounter (Signed)
Requested Prescriptions  Pending Prescriptions Disp Refills  . hydrochlorothiazide (HYDRODIURIL) 25 MG tablet [Pharmacy Med Name: HYDROCHLOROTHIAZIDE 25 MG Tablet] 90 tablet 1    Sig: TAKE 1 TABLET EVERY DAY     Cardiovascular: Diuretics - Thiazide Failed - 09/06/2022 10:41 AM      Failed - Cr in normal range and within 180 days    Creatinine  Date Value Ref Range Status  02/29/2012 1.16 0.60 - 1.30 mg/dL Final   Creatinine, Ser  Date Value Ref Range Status  05/04/2022 1.16 (H) 0.57 - 1.00 mg/dL Final         Passed - K in normal range and within 180 days    Potassium  Date Value Ref Range Status  05/04/2022 4.3 3.5 - 5.2 mmol/L Final  08/08/2014 4.7 3.5 - 5.1 mmol/L Final         Passed - Na in normal range and within 180 days    Sodium  Date Value Ref Range Status  05/04/2022 141 134 - 144 mmol/L Final  02/29/2012 142 136 - 145 mmol/L Final         Passed - Last BP in normal range    BP Readings from Last 1 Encounters:  06/21/22 120/80         Passed - Valid encounter within last 6 months    Recent Outpatient Visits          2 months ago Eczema, unspecified type   [facility] [place], Deanna C, MD   4 months ago Essential hypertension   [facility] [place], [place], MD   10 months ago Right upper quadrant pain   [facility] [place], Deanna C, MD   11 months ago Essential hypertension   [facility] [place], Deanna C, MD   1 year ago Essential hypertension   [facility] [place], [place], MD      Future Appointments            In 2 months Juline Patch, MD [facility] at [facility], [facility]

## 2022-09-14 DIAGNOSIS — R0609 Other forms of dyspnea: Secondary | ICD-10-CM | POA: Diagnosis not present

## 2022-09-14 DIAGNOSIS — J4489 Other specified chronic obstructive pulmonary disease: Secondary | ICD-10-CM | POA: Diagnosis not present

## 2022-09-16 DIAGNOSIS — H0011 Chalazion right upper eyelid: Secondary | ICD-10-CM | POA: Diagnosis not present

## 2022-09-20 ENCOUNTER — Other Ambulatory Visit: Payer: Self-pay

## 2022-09-20 ENCOUNTER — Encounter: Payer: Self-pay | Admitting: Emergency Medicine

## 2022-09-20 ENCOUNTER — Emergency Department
Admission: EM | Admit: 2022-09-20 | Discharge: 2022-09-20 | Discharge status: Against Medical Advice | Payer: Medicare HMO | Attending: Physician Assistant | Admitting: Physician Assistant

## 2022-09-20 ENCOUNTER — Emergency Department: Payer: Medicare HMO

## 2022-09-20 DIAGNOSIS — R4781 Slurred speech: Secondary | ICD-10-CM | POA: Diagnosis not present

## 2022-09-20 DIAGNOSIS — I10 Essential (primary) hypertension: Secondary | ICD-10-CM | POA: Insufficient documentation

## 2022-09-20 DIAGNOSIS — Z5321 Procedure and treatment not carried out due to patient leaving prior to being seen by health care provider: Secondary | ICD-10-CM | POA: Diagnosis not present

## 2022-09-20 LAB — URINALYSIS, ROUTINE W REFLEX MICROSCOPIC
Bacteria, UA: NONE SEEN
Bilirubin Urine: NEGATIVE
Glucose, UA: NEGATIVE mg/dL
Hgb urine dipstick: NEGATIVE
Ketones, ur: NEGATIVE mg/dL
Leukocytes,Ua: NEGATIVE
Nitrite: NEGATIVE
Protein, ur: >=300 mg/dL — AB
Specific Gravity, Urine: 1.013 (ref 1.005–1.030)
pH: 6 (ref 5.0–8.0)

## 2022-09-20 LAB — CBC
HCT: 42 % (ref 36.0–46.0)
Hemoglobin: 13.7 g/dL (ref 12.0–15.0)
MCH: 27.8 pg (ref 26.0–34.0)
MCHC: 32.6 g/dL (ref 30.0–36.0)
MCV: 85.4 fL (ref 80.0–100.0)
Platelets: 278 10*3/uL (ref 150–400)
RBC: 4.92 MIL/uL (ref 3.87–5.11)
RDW: 12.6 % (ref 11.5–15.5)
WBC: 11.2 10*3/uL — ABNORMAL HIGH (ref 4.0–10.5)
nRBC: 0 % (ref 0.0–0.2)

## 2022-09-20 LAB — DIFFERENTIAL
Abs Immature Granulocytes: 0.09 10*3/uL — ABNORMAL HIGH (ref 0.00–0.07)
Basophils Absolute: 0.1 10*3/uL (ref 0.0–0.1)
Basophils Relative: 1 %
Eosinophils Absolute: 0 10*3/uL (ref 0.0–0.5)
Eosinophils Relative: 0 %
Immature Granulocytes: 1 %
Lymphocytes Relative: 23 %
Lymphs Abs: 2.6 10*3/uL (ref 0.7–4.0)
Monocytes Absolute: 0.7 10*3/uL (ref 0.1–1.0)
Monocytes Relative: 6 %
Neutro Abs: 7.8 10*3/uL — ABNORMAL HIGH (ref 1.7–7.7)
Neutrophils Relative %: 69 %

## 2022-09-20 LAB — URINE DRUG SCREEN, QUALITATIVE (ARMC ONLY)
Amphetamines, Ur Screen: NOT DETECTED
Barbiturates, Ur Screen: NOT DETECTED
Benzodiazepine, Ur Scrn: NOT DETECTED
Cannabinoid 50 Ng, Ur ~~LOC~~: NOT DETECTED
Cocaine Metabolite,Ur ~~LOC~~: NOT DETECTED
MDMA (Ecstasy)Ur Screen: NOT DETECTED
Methadone Scn, Ur: NOT DETECTED
Opiate, Ur Screen: NOT DETECTED
Phencyclidine (PCP) Ur S: NOT DETECTED
Tricyclic, Ur Screen: NOT DETECTED

## 2022-09-20 LAB — COMPREHENSIVE METABOLIC PANEL
ALT: 13 U/L (ref 0–44)
AST: 18 U/L (ref 15–41)
Albumin: 3.6 g/dL (ref 3.5–5.0)
Alkaline Phosphatase: 115 U/L (ref 38–126)
Anion gap: 11 (ref 5–15)
BUN: 42 mg/dL — ABNORMAL HIGH (ref 8–23)
CO2: 28 mmol/L (ref 22–32)
Calcium: 9.6 mg/dL (ref 8.9–10.3)
Chloride: 99 mmol/L (ref 98–111)
Creatinine, Ser: 1.44 mg/dL — ABNORMAL HIGH (ref 0.44–1.00)
GFR, Estimated: 38 mL/min — ABNORMAL LOW (ref >60)
Glucose, Bld: 182 mg/dL — ABNORMAL HIGH (ref 70–99)
Potassium: 4.8 mmol/L (ref 3.5–5.1)
Sodium: 138 mmol/L (ref 135–145)
Total Bilirubin: 0.7 mg/dL (ref 0.3–1.2)
Total Protein: 7.3 g/dL (ref 6.5–8.1)

## 2022-09-20 NOTE — ED Notes (Signed)
No answer when called several times from lobby 

## 2022-09-20 NOTE — ED Triage Notes (Signed)
Pt to ED via POV, pt states that today around lunch time she noticed that her blood pressure was high. Pt states that her systolic BP at home was over 200. Pt states that last time she felt completely normal was around lunch time. Pt reports that she noticed that her speech seemed slurred. Pt is A & O x 4.

## 2022-09-20 NOTE — ED Provider Triage Note (Signed)
Emergency Medicine Provider Triage Evaluation Note  KEYAIRA CLAPHAM, a 76 y.o. female  was evaluated in triage.  Pt complains of hypertension and speech difficulty. She reports a history of stroke and was concerned for the same today. She reports she last felt well at about 12:30 pm. She presents with symptoms resolved but notes elevated BP. She took an ASA just after symptom onset.   Review of Systems  Positive: High BP, slurred speech Negative: Weakness, paralysis  Physical Exam  BP (!) 225/94 (BP Location: Left Arm)   Pulse 68   Temp 98.3 F (36.8 C) (Oral)   Resp 16   SpO2 96%  Gen:   Awake, no distress  NAD Resp:  Normal effort CTA MSK:   Moves extremities without difficulty  Other:    Medical Decision Making  Medically screening exam initiated at 5:50 PM.  Appropriate orders placed.  TEREZA GILHAM was informed that the remainder of the evaluation will be completed by another provider, this initial triage assessment does not replace that evaluation, and the importance of remaining in the ED until their evaluation is complete.  Patient with a history of CVA presents to the ED for evaluation of elevated BP and resolved speech difficulty.    Melvenia Needles, PA-C 09/20/22 1800

## 2022-09-20 NOTE — ED Notes (Signed)
No answer when called several times from lobby; no answer when phone # listed in chart called 

## 2022-09-21 ENCOUNTER — Emergency Department
Admission: EM | Admit: 2022-09-21 | Discharge: 2022-09-21 | Disposition: A | Discharge status: Home / Self Care | Payer: Medicare HMO | Attending: Student in an Organized Health Care Education/Training Program | Admitting: Student in an Organized Health Care Education/Training Program

## 2022-09-21 ENCOUNTER — Emergency Department: Payer: Medicare HMO

## 2022-09-21 DIAGNOSIS — I10 Essential (primary) hypertension: Secondary | ICD-10-CM | POA: Diagnosis not present

## 2022-09-21 DIAGNOSIS — I159 Secondary hypertension, unspecified: Secondary | ICD-10-CM | POA: Diagnosis not present

## 2022-09-21 DIAGNOSIS — R519 Headache, unspecified: Secondary | ICD-10-CM | POA: Diagnosis present

## 2022-09-21 DIAGNOSIS — G459 Transient cerebral ischemic attack, unspecified: Secondary | ICD-10-CM | POA: Diagnosis not present

## 2022-09-21 LAB — CBC WITH DIFFERENTIAL/PLATELET
Abs Immature Granulocytes: 0.09 10*3/uL — ABNORMAL HIGH (ref 0.00–0.07)
Basophils Absolute: 0.1 10*3/uL (ref 0.0–0.1)
Basophils Relative: 1 %
Eosinophils Absolute: 0 10*3/uL (ref 0.0–0.5)
Eosinophils Relative: 0 %
HCT: 43.5 % (ref 36.0–46.0)
Hemoglobin: 14.1 g/dL (ref 12.0–15.0)
Immature Granulocytes: 1 %
Lymphocytes Relative: 35 %
Lymphs Abs: 3.3 10*3/uL (ref 0.7–4.0)
MCH: 27.9 pg (ref 26.0–34.0)
MCHC: 32.4 g/dL (ref 30.0–36.0)
MCV: 86.1 fL (ref 80.0–100.0)
Monocytes Absolute: 0.8 10*3/uL (ref 0.1–1.0)
Monocytes Relative: 9 %
Neutro Abs: 5.3 10*3/uL (ref 1.7–7.7)
Neutrophils Relative %: 54 %
Platelets: 260 10*3/uL (ref 150–400)
RBC: 5.05 MIL/uL (ref 3.87–5.11)
RDW: 12.5 % (ref 11.5–15.5)
WBC: 9.5 10*3/uL (ref 4.0–10.5)
nRBC: 0 % (ref 0.0–0.2)

## 2022-09-21 LAB — BASIC METABOLIC PANEL
Anion gap: 12 (ref 5–15)
BUN: 37 mg/dL — ABNORMAL HIGH (ref 8–23)
CO2: 29 mmol/L (ref 22–32)
Calcium: 9.9 mg/dL (ref 8.9–10.3)
Chloride: 99 mmol/L (ref 98–111)
Creatinine, Ser: 1.33 mg/dL — ABNORMAL HIGH (ref 0.44–1.00)
GFR, Estimated: 42 mL/min — ABNORMAL LOW (ref >60)
Glucose, Bld: 132 mg/dL — ABNORMAL HIGH (ref 70–99)
Potassium: 4.3 mmol/L (ref 3.5–5.1)
Sodium: 140 mmol/L (ref 135–145)

## 2022-09-21 LAB — TROPONIN I (HIGH SENSITIVITY): Troponin I (High Sensitivity): 8 ng/L (ref <18)

## 2022-09-21 MED ORDER — LORAZEPAM 1 MG PO TABS
0.5000 mg | ORAL_TABLET | Freq: Once | ORAL | Status: AC
  Administered 2022-09-21: 0.5 mg via ORAL
  Filled 2022-09-21: qty 1

## 2022-09-21 NOTE — Discharge Instructions (Signed)
As we discussed I recommend that you increase your amlodipine to 5 mg daily.  Follow-up with PCP.  Return to the ER if you have any worsening symptoms pain discomfort or any additional questions or concerns.

## 2022-09-21 NOTE — ED Triage Notes (Addendum)
Pt presents to ED with c/o of being sent back to ED by PCP due to yesterdays BP. Pt states seen yesterday for hypertension and slurred speech but left due to the wait. Pt denies any complaints at this time.    Pt ambulatory to triage, no slurred speech noted, pt does endorse "I don't want to be here if I don't need too'. Pt states 1 few different types of BP meds and states she hasn't missed any doses.

## 2022-09-21 NOTE — ED Provider Notes (Signed)
[facility] Provider Note    Event Date/Time   First MD Initiated Contact with Patient 09/21/22 1300     (approximate)   History   Hypertension   HPI  Judy Bolton is a 76 y.o. female   history hypertension presents to the ER for evaluation of headache and elevated blood pressure.  Reportedly had an episode yesterday with a headache and also had some slurred speech.  The symptoms resolved yesterday.  Says she also had some palpitations.  Was recently stopped on her gabapentin.  Denies any numbness or tingling no chest pain or pressure no shortness of breath.      Physical Exam   Triage Vital Signs: ED Triage Vitals  Enc Vitals Group     BP 09/21/22 1203 (!) 187/80     Pulse Rate 09/21/22 1203 63     Resp 09/21/22 1203 17     Temp 09/21/22 1203 98.1 F (36.7 C)     Temp Source 09/21/22 1203 Oral     SpO2 09/21/22 1203 97 %     Weight --      Height 09/21/22 1312 5\' 1"  (1.549 m)     Head Circumference --      Peak Flow --      Pain Score 09/21/22 1159 0     Pain Loc --      Pain Edu? --      Excl. in [place]? --     Most recent vital signs: Vitals:   09/21/22 1203  BP: (!) 187/80  Pulse: 63  Resp: 17  Temp: 98.1 F (36.7 C)  SpO2: 97%     Constitutional: Alert  Eyes: Conjunctivae are normal.  Head: Atraumatic. Nose: No congestion/rhinnorhea. Mouth/Throat: Mucous membranes are moist.   Neck: Painless ROM.  Cardiovascular:   Good peripheral circulation. Respiratory: Normal respiratory effort.  No retractions.  Gastrointestinal: Soft and nontender.  Musculoskeletal:  no deformity Neurologic:  CN- intact.  No facial droop, Normal FNF.  Normal heel to shin.  Sensation intact bilaterally. Normal speech and language. No gross focal neurologic deficits are appreciated. No gait instability. Skin:  Skin is warm, dry and intact. No rash noted. Psychiatric: Mood and affect are normal. Speech and behavior are normal.    ED Results /  Procedures / Treatments   Labs (all labs ordered are listed, but only abnormal results are displayed) Labs Reviewed  BASIC METABOLIC PANEL - Abnormal; Notable for the following components:      Result Value   Glucose, Bld 132 (*)    BUN 37 (*)    Creatinine, Ser 1.33 (*)    GFR, Estimated 42 (*)    All other components within normal limits  CBC WITH DIFFERENTIAL/PLATELET - Abnormal; Notable for the following components:   Abs Immature Granulocytes 0.09 (*)    All other components within normal limits  URINALYSIS, ROUTINE W REFLEX MICROSCOPIC  TROPONIN I (HIGH SENSITIVITY)  TROPONIN I (HIGH SENSITIVITY)     EKG  ED ECG REPORT I, Merlyn Lot, the attending physician, personally viewed and interpreted this ECG.   Date: 09/21/2022  EKG Time: 17:58  Rate: sinus  Rhythm: normal  Axis: normal  Intervals: normal  ST&T Change: no stemi, no depressions   RADIOLOGY Please see ED Course for my review and interpretation.  I personally reviewed all radiographic images ordered to evaluate for the above acute complaints and reviewed radiology reports and findings.  These findings were personally discussed with the  patient.  Please see medical record for radiology report.    PROCEDURES:  Critical Care performed:   Procedures   MEDICATIONS ORDERED IN ED: Medications  LORazepam (ATIVAN) tablet 0.5 mg (0.5 mg Oral Given 09/21/22 1326)     IMPRESSION / MDM / ASSESSMENT AND PLAN / ED COURSE  I reviewed the triage vital signs and the nursing notes.                              Differential diagnosis includes, but is not limited to, hypertension, medication noncompliance, TIA, CVA, hypertensive urgency  Patient presenting to the ER for evaluation of symptoms as described above.  Based on symptoms, risk factors and considered above differential, this presenting complaint could reflect a potentially life-threatening illness therefore the patient will be placed on continuous  pulse oximetry and telemetry for monitoring.  Laboratory evaluation will be sent to evaluate for the above complaints.  Her exam is nonfocal and reassuring.  Not consistent with CVA.  MRI ordered at triage does not show any evidence of acute intracranial abnormality.  Her troponin is negative.  EKG nonischemic.  She is hypertensive but does not seem to be having any signs of endorgan damage.  We will increase her amlodipine and instructed patient have close outpatient follow-up with her PCP.       FINAL CLINICAL IMPRESSION(S) / ED DIAGNOSES   Final diagnoses:  Secondary hypertension     Rx / DC Orders   ED Discharge Orders     None        Note:  This document was prepared using Dragon voice recognition software and may include unintentional dictation errors.    Willy Eddy, MD 09/21/22 1444

## 2022-09-21 NOTE — ED Provider Triage Note (Signed)
Emergency Medicine Provider Triage Evaluation Note  Judy Bolton , a 76 y.o. female  was evaluated in triage.  Pt complains of headache.  She reports that she was called by her outpatient provider and told to come back to the emergency department immediately for elevated blood pressure.  Patient reports that she came yesterday for headache and slurred speech.  She reports that she was talking on the phone and her speech "did not make sense."  She reports that this has resolved.  She also reports that she had palpitations yesterday.  She reports that all of her symptoms have resolved with the exception of mild headache.  No vision changes.  Review of Systems  Positive: headache Negative: weakness  Physical Exam  BP (!) 187/80 (BP Location: Right Arm)   Pulse 63   Temp 98.1 F (36.7 C) (Oral)   Resp 17   SpO2 97%  Gen:   Awake, no distress   Resp:  Normal effort  MSK:   Moves extremities without difficulty  Other:    Medical Decision Making  Medically screening exam initiated at 12:43 PM.  Appropriate orders placed.  STORM DULSKI was informed that the remainder of the evaluation will be completed by another provider, this initial triage assessment does not replace that evaluation, and the importance of remaining in the ED until their evaluation is complete.  Discussed case with Dr. Quentin Cornwall who agrees with obtaining MRI and repeat blood work   Marquette Old, PA-C 09/21/22 1344

## 2022-09-22 ENCOUNTER — Ambulatory Visit: Payer: Self-pay

## 2022-09-22 NOTE — Telephone Encounter (Signed)
  Chief Complaint: anxiety/anger Symptoms: angry, mad at the world, wanting to be left alone  Frequency: several days Pertinent Negatives: Patient denies SI Disposition: [] ED /[] Urgent Care (no appt availability in office) / [x] Appointment(In office/virtual)/ []  [facility]/ [] Home Care/ [] Refused Recommended Disposition /[] [facility]/ []  Follow-up with PCP Additional Notes: pt has been to ED past 2 days for elevated BP and sx and they havent done anything for her besides told her to double up on amlodipine. Pt says she didn't want to do that without talking to Dr Ronnald Ramp first. Pt requesting to come in sooner than 26th because she has never felt this way and wanting relief. Scheduled appt for tomorrow at 1600. Pt also states she ran out of gabapentin couple of weeks ago and contacted pharmacy and finally just got delivered medication yesterday.   Reason for Disposition  MODERATE anxiety (e.g., persistent or frequent anxiety symptoms; interferes with sleep, school, or work)  Answer Assessment - Initial Assessment Questions 1. CONCERN: "Did anything happen that prompted you to call today?"      Ill and mad at world  2. ANXIETY SYMPTOMS: "Can you describe how you (your loved one; patient) have been feeling?" (e.g., tense, restless, panicky, anxious, keyed up, overwhelmed, sense of impending doom).      Mad  3. ONSET: "How long have you been feeling this way?" (e.g., hours, days, weeks)     Last few days  6. HISTORY: "Have you felt this way before?" "Have you ever been diagnosed with an anxiety problem in the past?" (e.g., generalized anxiety disorder, panic attacks, PTSD). If Yes, ask: "How was this problem treated?" (e.g., medicines, counseling, etc.)     no 7. RISK OF HARM - SUICIDAL IDEATION: "Do you ever have thoughts of hurting or killing yourself?" If Yes, ask:  "Do you have these feelings now?" "Do you have a plan on how you would do this?"     no 12. OTHER SYMPTOMS:  "Do you have any other symptoms?" (e.g., feeling depressed, trouble concentrating, trouble sleeping, trouble breathing, palpitations or fast heartbeat, chest pain, sweating, nausea, or diarrhea)       BP, increased HR  Protocols used: Anxiety and Panic Attack-A-AH

## 2022-09-23 ENCOUNTER — Ambulatory Visit (INDEPENDENT_AMBULATORY_CARE_PROVIDER_SITE_OTHER): Payer: Medicare HMO | Admitting: Family Medicine

## 2022-09-23 ENCOUNTER — Encounter: Payer: Self-pay | Admitting: Family Medicine

## 2022-09-23 VITALS — BP 138/92 | HR 80 | Ht 61.0 in | Wt 134.0 lb

## 2022-09-23 DIAGNOSIS — F411 Generalized anxiety disorder: Secondary | ICD-10-CM | POA: Diagnosis not present

## 2022-09-23 DIAGNOSIS — F410 Panic disorder [episodic paroxysmal anxiety] without agoraphobia: Secondary | ICD-10-CM | POA: Diagnosis not present

## 2022-09-23 DIAGNOSIS — I10 Essential (primary) hypertension: Secondary | ICD-10-CM

## 2022-09-23 MED ORDER — ALPRAZOLAM 0.25 MG PO TABS: 0.2500 mg | ORAL_TABLET | Freq: Two times a day (BID) | ORAL | 0 refills | Status: DC | PRN

## 2022-09-23 MED ORDER — AMLODIPINE BESYLATE 5 MG PO TABS: 5.0000 mg | ORAL_TABLET | Freq: Every day | ORAL | 1 refills | Status: DC

## 2022-09-23 NOTE — Progress Notes (Signed)
Date:  09/23/2022   Name:  Judy Bolton   DOB:  11/28/1946   MRN:  [id number]   Chief Complaint: Hypertension (Follow up)  Hypertension This is a chronic problem. The current episode started more than 1 year ago. The problem has been gradually improving since onset. The problem is controlled. Associated symptoms include anxiety. Pertinent negatives include no blurred vision, chest pain, headaches, neck pain, orthopnea, palpitations, peripheral edema, PND or shortness of breath. Past treatments include calcium channel blockers, diuretics and beta blockers. The current treatment provides moderate improvement. There are no compliance problems.  There is no history of angina, kidney disease, CAD/MI, CVA, heart failure, left ventricular hypertrophy, PVD or retinopathy. ?tia. There is no history of chronic renal disease, a hypertension causing med or renovascular disease.  Anxiety Presents for follow-up visit. Symptoms include excessive worry, irritability and panic. Patient reports no chest pain, depressed mood, dizziness, nausea, nervous/anxious behavior, obsessions, palpitations, shortness of breath or suicidal ideas. The severity of symptoms is mild.      Lab Results  Component Value Date   NA 140 09/21/2022   K 4.3 09/21/2022   CO2 29 09/21/2022   GLUCOSE 132 (H) 09/21/2022   BUN 37 (H) 09/21/2022   CREATININE 1.33 (H) 09/21/2022   CALCIUM 9.9 09/21/2022   EGFR 50 (L) 05/04/2022   GFRNONAA 42 (L) 09/21/2022   Lab Results  Component Value Date   CHOL 172 05/04/2022   HDL 64 05/04/2022   LDLCALC 75 05/04/2022   TRIG 203 (H) 05/04/2022   CHOLHDL 2.8 01/24/2018   No results found for: "TSH" Lab Results  Component Value Date   HGBA1C 6.4 04/20/2022   Lab Results  Component Value Date   WBC 9.5 09/21/2022   HGB 14.1 09/21/2022   HCT 43.5 09/21/2022   MCV 86.1 09/21/2022   PLT 260 09/21/2022   Lab Results  Component Value Date   ALT 13 09/20/2022   AST 18 09/20/2022    ALKPHOS 115 09/20/2022   BILITOT 0.7 09/20/2022   No results found for: "25OHVITD2", "25OHVITD3", "VD25OH"   Review of Systems  Constitutional:  Positive for irritability. Negative for chills and fever.  HENT:  Negative for drooling, ear discharge, ear pain and sore throat.   Eyes:  Negative for blurred vision.  Respiratory:  Negative for cough, shortness of breath and wheezing.   Cardiovascular:  Negative for chest pain, palpitations, orthopnea, leg swelling and PND.  Gastrointestinal:  Negative for abdominal pain, blood in stool, constipation, diarrhea and nausea.  Endocrine: Negative for polydipsia.  Genitourinary:  Negative for dysuria, frequency, hematuria and urgency.  Musculoskeletal:  Negative for back pain, myalgias and neck pain.  Skin:  Negative for rash.  Allergic/Immunologic: Negative for environmental allergies.  Neurological:  Negative for dizziness and headaches.  Hematological:  Does not bruise/bleed easily.  Psychiatric/Behavioral:  Negative for suicidal ideas. The patient is not nervous/anxious.     Patient Active Problem List   Diagnosis Date Noted   Primary osteoarthritis of right hip 08/21/2021   Essential hypertension 08/30/2017   Gastroesophageal reflux disease 08/30/2017   Aortic atherosclerosis ([place]) 05/31/2017   PAD (peripheral artery disease) ([place]) 05/31/2017   Encounter for long-term (current) use of high-risk medication 12/28/2016   Seronegative rheumatoid arthritis ([place]) 12/28/2016   Bilateral hand pain 11/08/2016   Elevated C-reactive protein 11/08/2016   Swelling of joint of right hand 11/08/2016   Type 2 diabetes mellitus with microalbuminuria, without long-term current use of insulin ([place])  07/06/2016   Acute anxiety 04/16/2016   Bronchitis 04/16/2016   Centrilobular emphysema ([place]) 04/16/2016   TIA (transient ischemic attack) 02/29/2016   Lumbar stenosis with neurogenic claudication 04/16/2015   Spondylosis of lumbar region without  myelopathy or radiculopathy 04/16/2015   Hyperlipidemia 10/15/2014    Allergies  Allergen Reactions   Rosuvastatin Swelling    Neck swelling   Betadine [Povidone Iodine] Swelling   Codeine Itching   Cyclobenzaprine     Other reaction(s): Dizziness   Latex Itching and Swelling    (urinary catheter)   Losartan     intolerance   Meloxicam Swelling   Penicillins Other (See Comments)    Paralysis  Has patient had a PCN reaction causing immediate rash, facial/tongue/throat swelling, SOB or lightheadedness with hypotension: No Has patient had a PCN reaction causing severe rash involving mucus membranes or skin necrosis: No Has patient had a PCN reaction that required hospitalization Yes Has patient had a PCN reaction occurring within the last 10 years: No If all of the above answers are "NO", then may proceed with Cephalosporin use.   Valsartan     Past Surgical History:  Procedure Laterality Date   ABDOMINAL HYSTERECTOMY     ANTERIOR (CYSTOCELE) AND POSTERIOR REPAIR (RECTOCELE) WITH XENFORM GRAFT AND SACROSPINOUS FIXATION     APPENDECTOMY     BROW LIFT Bilateral 09/05/2018   Procedure: BLEPHAROPLASTY UPPER EYELID W/EXCESS SKIN;  Surgeon: Karle Starch, MD;  Location: [facility];  Service: Ophthalmology;  Laterality: Bilateral;   CATARACT EXTRACTION W/PHACO Left 01/06/2022   Procedure: CATARACT EXTRACTION PHACO AND INTRAOCULAR LENS PLACEMENT (I[facility]) LEFT DIABETIC 8.09 01:07.3;  Surgeon: Leandrew Koyanagi, MD;  Location: [facility];  Service: Ophthalmology;  Laterality: Left;   CATARACT EXTRACTION W/PHACO Right 01/20/2022   Procedure: CATARACT EXTRACTION PHACO AND INTRAOCULAR LENS PLACEMENT ([place]) RIGHT DIABETIC;  Surgeon: Leandrew Koyanagi, MD;  Location: [facility];  Service: Ophthalmology;  Laterality: Right;  6.03 01:02.2   CTR Bilateral    LUMBAR LAMINECTOMY/DECOMPRESSION MICRODISCECTOMY N/A 07/27/2017   Procedure: LUMBAR LAMINECTOMY/DECOMPRESSION  MICRODISCECTOMY 3 LEVELS-L3-S1;  Surgeon: Meade Maw, MD;  Location: [facility];  Service: Neurosurgery;  Laterality: N/A;   PTOSIS REPAIR Bilateral 09/05/2018   Procedure: PTOSIS REPAIR RESECT EX;  Surgeon: Karle Starch, MD;  Location: [facility];  Service: Ophthalmology;  Laterality: Bilateral;  Diabetic - oral meds sleep apnea Latexd allergy   ROTATOR CUFF REPAIR Right     Social History   Tobacco Use   Smoking status: Former    Packs/day: 1.50    Years: 28.00    Total pack years: 42.00    Types: Cigarettes    Quit date: 08/14/2000    Years since quitting: 22.1   Smokeless tobacco: Never   Tobacco comments:    smoking cessatiuon materials not required  Vaping Use   Vaping Use: Never used  Substance Use Topics   Alcohol use: No   Drug use: No     Medication list has been reviewed and updated.  Current Meds  Medication Sig   albuterol (PROVENTIL HFA;VENTOLIN HFA) 108 (90 Base) MCG/ACT inhaler Inhale 2 puffs into the lungs every 6 (six) hours as needed for wheezing or shortness of breath.   albuterol (PROVENTIL) (2.5 MG/3ML) 0.083% nebulizer solution Take 3 mLs (2.5 mg total) by nebulization every 6 (six) hours as needed for wheezing or shortness of breath.   amLODipine (NORVASC) 5 MG tablet Take 5 mg by mouth daily.   atorvastatin (LIPITOR) 40  MG tablet Take one by mouth daily.   atorvastatin (LIPITOR) 40 MG tablet Take 1 tablet by mouth daily.   celecoxib (CELEBREX) 200 MG capsule Take 1 capsule by mouth daily.   cholecalciferol (VITAMIN D3) 25 MCG (1000 UNIT) tablet Take 1,000 Units by mouth daily.   clotrimazole-betamethasone (LOTRISONE) cream Apply 1 Application topically daily. To feet   FASENRA 30 MG/ML SOSY Once every 2 months for asthma. Paid for by [employer]   gabapentin (NEURONTIN) 300 MG capsule Take 1 capsule by mouth daily.   gemfibrozil (LOPID) 600 MG tablet TAKE 1 TABLET TWICE DAILY   hydrochlorothiazide (HYDRODIURIL) 25 MG tablet TAKE 1  TABLET EVERY DAY   ipratropium (ATROVENT) 0.02 % nebulizer solution Inhale into the lungs.   metFORMIN (GLUCOPHAGE) 500 MG tablet Take 500 mg by mouth 2 (two) times daily.   metoprolol tartrate (LOPRESSOR) 50 MG tablet TAKE 1 TABLET TWICE DAILY   montelukast (SINGULAIR) 10 MG tablet TAKE 1 TABLET EVERY DAY   neomycin-polymyxin b-dexamethasone (MAXITROL) 3.5-10000-0.1 OINT 4 (four) times daily. opth   nystatin cream (MYCOSTATIN) Apply 1 Application topically 2 (two) times daily.   pantoprazole (PROTONIX) 40 MG tablet TAKE 1 TABLET BY MOUTH EVERY DAY   vitamin B-12 (CYANOCOBALAMIN) 1000 MCG tablet Take 1,000 mcg by mouth daily.   [DISCONTINUED] amLODipine (NORVASC) 2.5 MG tablet TAKE 1 TABLET(2.5 MG) BY MOUTH DAILY       09/23/2022    4:18 PM 06/21/2022    1:40 PM 05/04/2022    1:18 PM 10/12/2021    8:15 AM  GAD 7 : Generalized Anxiety Score  Nervous, Anxious, on Edge 0 0 0 0  Control/stop worrying 0 0 0 0  Worry too much - different things 0 1 0 0  Trouble relaxing 0 1 0 0  Restless 0 0 0 0  Easily annoyed or irritable 0 0 0 0  Afraid - awful might happen 0 0 0 0  Total GAD 7 Score 0 2 0 0  Anxiety Difficulty Not difficult at all Not difficult at all Not difficult at all        09/23/2022    4:18 PM 06/21/2022    1:40 PM 05/04/2022    1:18 PM  Depression screen PHQ 2/9  Decreased Interest 0 0 0  Down, Depressed, Hopeless 0 0 0  PHQ - 2 Score 0 0 0  Altered sleeping 0 0 0  Tired, decreased energy 0 1 0  Change in appetite 0 1 0  Feeling bad or failure about yourself  0 0 0  Trouble concentrating 0 0 0  Moving slowly or fidgety/restless 0 0 0  Suicidal thoughts 0 0 0  PHQ-9 Score 0 2 0  Difficult doing work/chores  Not difficult at all Not difficult at all    BP Readings from Last 3 Encounters:  09/23/22 (!) 138/92  09/21/22 (!) 187/80  06/21/22 120/80    Physical Exam Vitals and nursing note reviewed.  Constitutional:      Appearance: She is well-developed.  HENT:      Head: Normocephalic.     Right Ear: Tympanic membrane and external ear normal.     Left Ear: Tympanic membrane and external ear normal.  Eyes:     General: Lids are everted, no foreign bodies appreciated. No scleral icterus.       Left eye: No foreign body or hordeolum.     Conjunctiva/sclera: Conjunctivae normal.     Right eye: Right conjunctiva is not injected.  Left eye: Left conjunctiva is not injected.     Pupils: Pupils are equal, round, and reactive to light.  Neck:     Thyroid: No thyromegaly.     Vascular: No JVD.     Trachea: No tracheal deviation.  Cardiovascular:     Rate and Rhythm: Normal rate and regular rhythm.     Heart sounds: Normal heart sounds. No murmur heard.    No friction rub. No gallop.  Pulmonary:     Effort: Pulmonary effort is normal. No respiratory distress.     Breath sounds: Normal breath sounds. No wheezing or rales.  Abdominal:     General: Bowel sounds are normal.     Palpations: Abdomen is soft. There is no mass.     Tenderness: There is no abdominal tenderness. There is no guarding or rebound.  Musculoskeletal:        General: No tenderness. Normal range of motion.     Cervical back: Normal range of motion and neck supple.  Lymphadenopathy:     Cervical: No cervical adenopathy.  Skin:    General: Skin is warm.     Findings: No rash.  Neurological:     Mental Status: She is alert and oriented to person, place, and time.     Cranial Nerves: No cranial nerve deficit.     Deep Tendon Reflexes: Reflexes normal.  Psychiatric:        Mood and Affect: Mood is not anxious or depressed.     Wt Readings from Last 3 Encounters:  09/23/22 134 lb (60.8 kg)  06/21/22 133 lb (60.3 kg)  05/04/22 132 lb (59.9 kg)    BP (!) 138/92 (BP Location: Right Arm, Cuff Size: Normal)   Pulse 80   Ht _0  (1.549 m)   Wt 134 lb (60.8 kg)   BMI 25.32 kg/m   Assessment and Plan:   1. Essential hypertension Chronic.  Uncontrolled.  Stable.   Patient has been on amlodipine 2.5 mg daily we will increase to 5 and she will double up on those until she receives her 5 mg tablets from [facility].  In the meantime we will continue her metoprolol and hydrochlorothiazide.  Blood pressure today is mildly elevated at 138/92 we will maintain on this dosing and will recheck in 4 weeks. - amLODipine (NORVASC) 5 MG tablet; Take 1 tablet (5 mg total) by mouth daily.  Dispense: 90 tablet; Refill: 1  2. Generalized anxiety disorder with panic attacks Chronic.  Controlled.  Stable.  PHQ is 0 GAD score is 3 by my count.  Patient has had some panic issues in the last couple days and I have given her Xanax 0.25 to use on a as needed basis for panic disorder.  In the meantime we will be also starting sertraline 25 mg once a day.    Otilio Miu, MD

## 2022-10-04 ENCOUNTER — Encounter (INDEPENDENT_AMBULATORY_CARE_PROVIDER_SITE_OTHER): Payer: Self-pay

## 2022-10-06 ENCOUNTER — Ambulatory Visit: Payer: Self-pay | Admitting: *Deleted

## 2022-10-06 NOTE — Telephone Encounter (Signed)
  Chief Complaint: nasal congestion Symptoms: cough because running down her throat, yellow phlegm Frequency: got over COVID a couple of weeks ago and then this started Pertinent Negatives: Patient denies fever Disposition: [] ED /[] Urgent Care (no appt availability in office) / [x] Appointment(In office/virtual)/ []  [facility]/ [] Home Care/ [] Refused Recommended Disposition /[] [facility]/ []  Follow-up with PCP Additional Notes: I recommended appt, pt just seen 07/24/22 and would just like something called in. Is taking OTC meds but needs something more. Home care discussed,   Reason for Disposition  Cough with cold symptoms (e.g., runny nose, postnasal drip, throat clearing)  Answer Assessment - Initial Assessment Questions 1. ONSET: "When did the cough begin?"      When had covid, but that was 3 weeks ago 2. SEVERITY: "How bad is the cough today?"      Chokes on mucous which is slight yellow tint 3. SPUTUM: "Describe the color of your sputum" (none, dry cough; clear, white, yellow, green)     Slight yellow tint 4. HEMOPTYSIS: "Are you coughing up any blood?" If so ask: "How much?" (flecks, streaks, tablespoons, etc.)     no 5. DIFFICULTY BREATHING: "Are you having difficulty breathing?" If Yes, ask: "How bad is it?" (e.g., mild, moderate, severe)    - MILD: No SOB at rest, mild SOB with walking, speaks normally in sentences, can lie down, no retractions, pulse < 100.    - MODERATE: SOB at rest, SOB with minimal exertion and prefers to sit, cannot lie down flat, speaks in phrases, mild retractions, audible wheezing, pulse 100-120.    - SEVERE: Very SOB at rest, speaks in single words, struggling to breathe, sitting hunched forward, retractions, pulse > 120      Getting choked on mucous 6. FEVER: "Do you have a fever?" If Yes, ask: "What is your temperature, how was it measured, and when did it start?"     no  Protocols used: Cough - Acute Productive-A-AH

## 2022-10-07 ENCOUNTER — Ambulatory Visit: Payer: Medicare HMO | Admitting: Family Medicine

## 2022-10-07 ENCOUNTER — Telehealth: Payer: Self-pay | Admitting: Family Medicine

## 2022-10-07 NOTE — Telephone Encounter (Signed)
Pt is calling to let the nurse know that she just saw the message about the appt that was scheduled today with Dr. Ronnald Ramp. Pt would like to know is there another appt in which she can be seen sooner than 10/14/22. Please advise [phone]

## 2022-10-08 ENCOUNTER — Encounter: Payer: Self-pay | Admitting: Family Medicine

## 2022-10-08 ENCOUNTER — Ambulatory Visit (INDEPENDENT_AMBULATORY_CARE_PROVIDER_SITE_OTHER): Payer: Medicare HMO | Admitting: Family Medicine

## 2022-10-08 VITALS — BP 138/90 | HR 70 | Ht 61.0 in | Wt 134.0 lb

## 2022-10-08 DIAGNOSIS — J301 Allergic rhinitis due to pollen: Secondary | ICD-10-CM

## 2022-10-08 DIAGNOSIS — J310 Chronic rhinitis: Secondary | ICD-10-CM | POA: Diagnosis not present

## 2022-10-08 MED ORDER — FLUNISOLIDE 25 MCG/ACT (0.025%) NA SOLN: 2.0000 | Freq: Two times a day (BID) | NASAL | 0 refills | Status: DC

## 2022-10-08 MED ORDER — MONTELUKAST SODIUM 10 MG PO TABS: 10.0000 mg | ORAL_TABLET | Freq: Every day | ORAL | 0 refills | Status: DC

## 2022-10-08 MED ORDER — PROMETHAZINE-DM 6.25-15 MG/5ML PO SYRP: 5.0000 mL | ORAL_SOLUTION | Freq: Four times a day (QID) | ORAL | 0 refills | Status: DC | PRN

## 2022-10-08 NOTE — Assessment & Plan Note (Addendum)
Patient with history of chronic allergic rhinitis presenting with 1 month history of worsened cough after a bout of COVID at that time.  Minimally productive, denies fevers, chills, of note was on a course of steroids and azithromycin a few weeks prior with a mild improvement while on course, recurrence thereafter.  Examination with clear lung fields throughout, no wheezes, rales, rhonchi, benign oropharynx, mildly erythematous turbinates without significant sinus tenderness, fluid behind the tympanic membranes bilaterally, canals benign.  Given her clinical course, recent medications, most likely sequela of infection superimposed on baseline chronic rhinitis.  Plan for intranasal steroid course, if prescribed Nasalide not covered, she can use Flonase.  Additionally as needed Rx antitussive, OTC Mucinex, and status update if symptoms still persist after 1 week.  Which point infectious etiologies to be considered.

## 2022-10-08 NOTE — Patient Instructions (Signed)
-   Use Rx nasal spray as directed x 7 days - If not able to pick up, use Flonase instead - Start Mucinex twice daily x 7 days then as-needed - Use Rx cough medicine as-needed - Contact us in 1 week if symptoms fail to improve - Follow-up as-needed

## 2022-10-08 NOTE — Progress Notes (Signed)
     Primary Care / Sports Medicine Office Visit  Patient Information:  Patient ID: Judy Bolton, female DOB: 07-05-1946 Age: 76 y.o. MRN: [id number]   Judy Bolton is a pleasant 76 y.o. female presenting with the following:  Chief Complaint  Patient presents with   Cough    Pt had Covid 1 month ago, cough started then hasn't stopped.     Vitals:   10/08/22 0948 10/08/22 0950  BP: (!) 136/90 (!) 138/90  Pulse: 70   SpO2: 97%    Vitals:   10/08/22 0948  Weight: 134 lb (60.8 kg)  Height: 5\' 1"  (1.549 m)   Body mass index is 25.32 kg/m.     Independent interpretation of notes and tests performed by another provider:   None  Procedures performed:   None  Pertinent History, Exam, Impression, and Recommendations:   Problem List Items Addressed This Visit       Respiratory   Chronic rhinitis - Primary    Patient with history of chronic allergic rhinitis presenting with 1 month history of worsened cough after a bout of COVID at that time.  Minimally productive, denies fevers, chills, of note was on a course of steroids and azithromycin a few weeks prior with a mild improvement while on course, recurrence thereafter.  Examination with clear lung fields throughout, no wheezes, rales, rhonchi, benign oropharynx, mildly erythematous turbinates without significant sinus tenderness, fluid behind the tympanic membranes bilaterally, canals benign.  Given her clinical course, recent medications, most likely sequela of infection superimposed on baseline chronic rhinitis.  Plan for intranasal steroid course, if prescribed Nasalide not covered, she can use Flonase.  Additionally as needed Rx antitussive, OTC Mucinex, and status update if symptoms still persist after 1 week.  Which point infectious etiologies to be considered.      Relevant Medications   flunisolide (NASALIDE) 25 MCG/ACT (0.025%) SOLN   promethazine-dextromethorphan (PROMETHAZINE-DM) 6.25-15 MG/5ML syrup    Other Visit Diagnoses     Seasonal allergic rhinitis due to pollen       Relevant Medications   montelukast (SINGULAIR) 10 MG tablet        Orders & Medications Meds ordered this encounter  Medications   montelukast (SINGULAIR) 10 MG tablet    Sig: Take 1 tablet (10 mg total) by mouth daily.    Dispense:  90 tablet    Refill:  0   flunisolide (NASALIDE) 25 MCG/ACT (0.025%) SOLN    Sig: Place 2 sprays into the nose 2 (two) times daily.    Dispense:  25 mL    Refill:  0   promethazine-dextromethorphan (PROMETHAZINE-DM) 6.25-15 MG/5ML syrup    Sig: Take 5 mLs by mouth 4 (four) times daily as needed for cough.    Dispense:  118 mL    Refill:  0   No orders of the defined types were placed in this encounter.    No follow-ups on file.     Montel Culver, MD   Primary Care Sports Medicine [place]

## 2022-10-21 ENCOUNTER — Ambulatory Visit (INDEPENDENT_AMBULATORY_CARE_PROVIDER_SITE_OTHER): Payer: Medicare HMO | Admitting: Family Medicine

## 2022-10-21 ENCOUNTER — Encounter: Payer: Self-pay | Admitting: Family Medicine

## 2022-10-21 VITALS — BP 124/72 | HR 71 | Ht 61.0 in | Wt 132.0 lb

## 2022-10-21 DIAGNOSIS — I10 Essential (primary) hypertension: Secondary | ICD-10-CM | POA: Diagnosis not present

## 2022-10-21 NOTE — Progress Notes (Signed)
Date:  10/21/2022   Name:  Judy Bolton   DOB:  1946/08/04   MRN:  [id number]   Chief Complaint: Hypertension (Recheck from Zigmund Daniel' appt)  Hypertension This is a chronic problem. The problem has been gradually improving since onset. The problem is controlled. Pertinent negatives include no anxiety, blurred vision, chest pain, headaches, malaise/fatigue, neck pain, orthopnea, palpitations, peripheral edema, PND, shortness of breath or sweats. Past treatments include beta blockers, diuretics and calcium channel blockers. The current treatment provides moderate improvement. There are no compliance problems.  There is no history of angina, kidney disease, CAD/MI, CVA, heart failure, left ventricular hypertrophy, PVD or retinopathy. There is no history of chronic renal disease, a hypertension causing med or renovascular disease.    Lab Results  Component Value Date   NA 140 09/21/2022   K 4.3 09/21/2022   CO2 29 09/21/2022   GLUCOSE 132 (H) 09/21/2022   BUN 37 (H) 09/21/2022   CREATININE 1.33 (H) 09/21/2022   CALCIUM 9.9 09/21/2022   EGFR 50 (L) 05/04/2022   GFRNONAA 42 (L) 09/21/2022   Lab Results  Component Value Date   CHOL 172 05/04/2022   HDL 64 05/04/2022   LDLCALC 75 05/04/2022   TRIG 203 (H) 05/04/2022   CHOLHDL 2.8 01/24/2018   No results found for: "TSH" Lab Results  Component Value Date   HGBA1C 6.4 04/20/2022   Lab Results  Component Value Date   WBC 9.5 09/21/2022   HGB 14.1 09/21/2022   HCT 43.5 09/21/2022   MCV 86.1 09/21/2022   PLT 260 09/21/2022   Lab Results  Component Value Date   ALT 13 09/20/2022   AST 18 09/20/2022   ALKPHOS 115 09/20/2022   BILITOT 0.7 09/20/2022   No results found for: "25OHVITD2", "25OHVITD3", "VD25OH"   Review of Systems  Constitutional: Negative.  Negative for chills, fatigue, fever, malaise/fatigue and unexpected weight change.  HENT:  Negative for congestion, ear discharge, ear pain, rhinorrhea, sinus pressure,  sneezing and sore throat.   Eyes:  Negative for blurred vision.  Respiratory:  Negative for cough, shortness of breath, wheezing and stridor.   Cardiovascular:  Negative for chest pain, palpitations, orthopnea and PND.  Gastrointestinal:  Negative for abdominal pain, blood in stool, constipation, diarrhea and nausea.  Genitourinary:  Negative for dysuria, flank pain, frequency, hematuria, urgency and vaginal discharge.  Musculoskeletal:  Negative for arthralgias, back pain, myalgias and neck pain.  Skin:  Negative for rash.  Neurological:  Negative for dizziness, weakness and headaches.  Hematological:  Negative for adenopathy. Does not bruise/bleed easily.  Psychiatric/Behavioral:  Negative for dysphoric mood. The patient is not nervous/anxious.     Patient Active Problem List   Diagnosis Date Noted   Chronic rhinitis 10/08/2022   Primary osteoarthritis of right hip 08/21/2021   Essential hypertension 08/30/2017   Gastroesophageal reflux disease 08/30/2017   Aortic atherosclerosis ([place]) 05/31/2017   PAD (peripheral artery disease) ([place]) 05/31/2017   Encounter for long-term (current) use of high-risk medication 12/28/2016   Seronegative rheumatoid arthritis ([place]) 12/28/2016   Bilateral hand pain 11/08/2016   Elevated C-reactive protein 11/08/2016   Swelling of joint of right hand 11/08/2016   Type 2 diabetes mellitus with microalbuminuria, without long-term current use of insulin ([place]) 07/06/2016   Acute anxiety 04/16/2016   Bronchitis 04/16/2016   Centrilobular emphysema ([place]) 04/16/2016   TIA (transient ischemic attack) 02/29/2016   Lumbar stenosis with neurogenic claudication 04/16/2015   Spondylosis of lumbar region without myelopathy or  radiculopathy 04/16/2015   Hyperlipidemia 10/15/2014    Allergies  Allergen Reactions   Rosuvastatin Swelling    Neck swelling   Betadine [Povidone Iodine] Swelling   Codeine Itching   Cyclobenzaprine     Other reaction(s): Dizziness    Latex Itching and Swelling    (urinary catheter)   Losartan     intolerance   Meloxicam Swelling   Penicillins Other (See Comments)    Paralysis  Has patient had a PCN reaction causing immediate rash, facial/tongue/throat swelling, SOB or lightheadedness with hypotension: No Has patient had a PCN reaction causing severe rash involving mucus membranes or skin necrosis: No Has patient had a PCN reaction that required hospitalization Yes Has patient had a PCN reaction occurring within the last 10 years: No If all of the above answers are "NO", then may proceed with Cephalosporin use.   Valsartan     Past Surgical History:  Procedure Laterality Date   ABDOMINAL HYSTERECTOMY     ANTERIOR (CYSTOCELE) AND POSTERIOR REPAIR (RECTOCELE) WITH XENFORM GRAFT AND SACROSPINOUS FIXATION     APPENDECTOMY     BROW LIFT Bilateral 09/05/2018   Procedure: BLEPHAROPLASTY UPPER EYELID W/EXCESS SKIN;  Surgeon: Karle Starch, MD;  Location: [facility];  Service: Ophthalmology;  Laterality: Bilateral;   CATARACT EXTRACTION W/PHACO Left 01/06/2022   Procedure: CATARACT EXTRACTION PHACO AND INTRAOCULAR LENS PLACEMENT (IOC) LEFT DIABETIC 8.09 01:07.3;  Surgeon: Leandrew Koyanagi, MD;  Location: [facility];  Service: Ophthalmology;  Laterality: Left;   CATARACT EXTRACTION W/PHACO Right 01/20/2022   Procedure: CATARACT EXTRACTION PHACO AND INTRAOCULAR LENS PLACEMENT ([place]) RIGHT DIABETIC;  Surgeon: Leandrew Koyanagi, MD;  Location: [facility];  Service: Ophthalmology;  Laterality: Right;  6.03 01:02.2   CTR Bilateral    LUMBAR LAMINECTOMY/DECOMPRESSION MICRODISCECTOMY N/A 07/27/2017   Procedure: LUMBAR LAMINECTOMY/DECOMPRESSION MICRODISCECTOMY 3 LEVELS-L3-S1;  Surgeon: Meade Maw, MD;  Location: [facility];  Service: Neurosurgery;  Laterality: N/A;   PTOSIS REPAIR Bilateral 09/05/2018   Procedure: PTOSIS REPAIR RESECT EX;  Surgeon: Karle Starch, MD;  Location: [facility];   Service: Ophthalmology;  Laterality: Bilateral;  Diabetic - oral meds sleep apnea Latexd allergy   ROTATOR CUFF REPAIR Right     Social History   Tobacco Use   Smoking status: Former    Packs/day: 1.50    Years: 28.00    Total pack years: 42.00    Types: Cigarettes    Quit date: 08/14/2000    Years since quitting: 22.2   Smokeless tobacco: Never   Tobacco comments:    smoking cessatiuon materials not required  Vaping Use   Vaping Use: Never used  Substance Use Topics   Alcohol use: No   Drug use: No     Medication list has been reviewed and updated.  Current Meds  Medication Sig   albuterol (PROVENTIL HFA;VENTOLIN HFA) 108 (90 Base) MCG/ACT inhaler Inhale 2 puffs into the lungs every 6 (six) hours as needed for wheezing or shortness of breath.   albuterol (PROVENTIL) (2.5 MG/3ML) 0.083% nebulizer solution Take 3 mLs (2.5 mg total) by nebulization every 6 (six) hours as needed for wheezing or shortness of breath.   ALPRAZolam (XANAX) 0.25 MG tablet Take 1 tablet (0.25 mg total) by mouth 2 (two) times daily as needed for anxiety.   amLODipine (NORVASC) 5 MG tablet Take 5 mg by mouth daily.   atorvastatin (LIPITOR) 40 MG tablet Take 1 tablet by mouth daily.   celecoxib (CELEBREX) 200 MG capsule Take 1  capsule by mouth daily.   cholecalciferol (VITAMIN D3) 25 MCG (1000 UNIT) tablet Take 1,000 Units by mouth daily.   clotrimazole-betamethasone (LOTRISONE) cream Apply 1 Application topically daily. To feet   FASENRA 30 MG/ML SOSY Once every 2 months for asthma. Paid for by [employer]   flunisolide (NASALIDE) 25 MCG/ACT (0.025%) SOLN Place 2 sprays into the nose 2 (two) times daily.   gabapentin (NEURONTIN) 300 MG capsule Take 1 capsule by mouth daily.   gemfibrozil (LOPID) 600 MG tablet TAKE 1 TABLET TWICE DAILY   hydrochlorothiazide (HYDRODIURIL) 25 MG tablet TAKE 1 TABLET EVERY DAY   ipratropium (ATROVENT) 0.02 % nebulizer solution Inhale into the lungs.   metFORMIN  (GLUCOPHAGE) 500 MG tablet Take 500 mg by mouth 2 (two) times daily.   metoprolol tartrate (LOPRESSOR) 50 MG tablet TAKE 1 TABLET TWICE DAILY   montelukast (SINGULAIR) 10 MG tablet Take 1 tablet (10 mg total) by mouth daily.   nystatin cream (MYCOSTATIN) Apply 1 Application topically 2 (two) times daily.   pantoprazole (PROTONIX) 40 MG tablet TAKE 1 TABLET BY MOUTH EVERY DAY   vitamin B-12 (CYANOCOBALAMIN) 1000 MCG tablet Take 1,000 mcg by mouth daily.       10/21/2022    2:06 PM 09/23/2022    4:18 PM 06/21/2022    1:40 PM 05/04/2022    1:18 PM  GAD 7 : Generalized Anxiety Score  Nervous, Anxious, on Edge 0 0 0 0  Control/stop worrying 0 0 0 0  Worry too much - different things 0 0 1 0  Trouble relaxing 0 0 1 0  Restless 0 0 0 0  Easily annoyed or irritable 0 0 0 0  Afraid - awful might happen 0 0 0 0  Total GAD 7 Score 0 0 2 0  Anxiety Difficulty Not difficult at all Not difficult at all Not difficult at all Not difficult at all       10/21/2022    2:05 PM 09/23/2022    4:18 PM 06/21/2022    1:40 PM  Depression screen PHQ 2/9  Decreased Interest 0 0 0  Down, Depressed, Hopeless 0 0 0  PHQ - 2 Score 0 0 0  Altered sleeping 0 0 0  Tired, decreased energy 0 0 1  Change in appetite 0 0 1  Feeling bad or failure about yourself  0 0 0  Trouble concentrating 0 0 0  Moving slowly or fidgety/restless 0 0 0  Suicidal thoughts 0 0 0  PHQ-9 Score 0 0 2  Difficult doing work/chores Not difficult at all  Not difficult at all    BP Readings from Last 3 Encounters:  10/21/22 134/80  10/08/22 (!) 138/90  09/23/22 (!) 138/92    Physical Exam Vitals and nursing note reviewed. Exam conducted with a chaperone present.  Constitutional:      General: She is not in acute distress.    Appearance: She is not diaphoretic.  HENT:     Head: Normocephalic and atraumatic.     Right Ear: Tympanic membrane and external ear normal.     Left Ear: Tympanic membrane and external ear normal.      Nose: Nose normal.  Eyes:     General:        Right eye: No discharge.        Left eye: No discharge.     Conjunctiva/sclera: Conjunctivae normal.     Pupils: Pupils are equal, round, and reactive to light.  Neck:  Thyroid: No thyromegaly.     Vascular: No JVD.  Cardiovascular:     Rate and Rhythm: Normal rate and regular rhythm.     Heart sounds: Normal heart sounds. No murmur heard.    No friction rub. No gallop.  Pulmonary:     Effort: Pulmonary effort is normal.     Breath sounds: Normal breath sounds.  Abdominal:     General: Bowel sounds are normal.     Palpations: Abdomen is soft. There is no mass.     Tenderness: There is no abdominal tenderness. There is no guarding or rebound.  Musculoskeletal:        General: Normal range of motion.     Cervical back: Normal range of motion and neck supple.  Lymphadenopathy:     Cervical: No cervical adenopathy.  Skin:    General: Skin is warm and dry.     Findings: No bruising or erythema.  Neurological:     Mental Status: She is alert.     Wt Readings from Last 3 Encounters:  10/21/22 132 lb (59.9 kg)  10/08/22 134 lb (60.8 kg)  09/23/22 134 lb (60.8 kg)    BP 134/80   Pulse 71   Ht _0  (1.549 m)   Wt 132 lb (59.9 kg)   SpO2 94%   BMI 24.94 kg/m   Assessment and Plan:  1. Essential hypertension Chronic.  Controlled.  Stable.  Blood pressure on second reading is 124/72.  Patient tolerating medications well which includes hydrochlorothiazide 25 mg once a day metoprolol 50 mg XL once a day and amlodipine 5 mg once a day.  Patient is asymptomatic and tolerating medication well.  We will recheck patient in 4 to 6 months.   Otilio Miu, MD

## 2022-10-29 ENCOUNTER — Other Ambulatory Visit: Payer: Self-pay | Admitting: Family Medicine

## 2022-10-29 DIAGNOSIS — I10 Essential (primary) hypertension: Secondary | ICD-10-CM

## 2022-10-30 ENCOUNTER — Other Ambulatory Visit: Payer: Self-pay | Admitting: Family Medicine

## 2022-10-30 DIAGNOSIS — J310 Chronic rhinitis: Secondary | ICD-10-CM

## 2022-11-01 ENCOUNTER — Other Ambulatory Visit: Payer: Self-pay | Admitting: Family Medicine

## 2022-11-01 DIAGNOSIS — R1011 Right upper quadrant pain: Secondary | ICD-10-CM

## 2022-11-01 NOTE — Telephone Encounter (Signed)
Requested Prescriptions  Pending Prescriptions Disp Refills   atorvastatin (LIPITOR) 40 MG tablet [Pharmacy Med Name: ATORVASTATIN CALCIUM 40 MG Tablet] 90 tablet 3    Sig: TAKE 1 TABLET EVERY DAY     Cardiovascular:  Antilipid - Statins Failed - 10/29/2022 11:15 AM      Failed - Lipid Panel in normal range within the last 12 months    Cholesterol, Total  Date Value Ref Range Status  05/04/2022 172 100 - 199 mg/dL Final   LDL Chol Calc (NIH)  Date Value Ref Range Status  05/04/2022 75 0 - 99 mg/dL Final   HDL  Date Value Ref Range Status  05/04/2022 64 >39 mg/dL Final   Triglycerides  Date Value Ref Range Status  05/04/2022 203 (H) 0 - 149 mg/dL Final         Passed - Patient is not pregnant      Passed - Valid encounter within last 12 months    Recent Outpatient Visits           1 week ago Essential hypertension   [place] [facility]   3 weeks ago Chronic rhinitis   [place] [facility] Ocie Bob, MD   1 month ago Essential hypertension   [place] [facility], [facility]   4 months ago Eczema, unspecified type   [facility]   6 months ago Essential hypertension   [place] [facility], [facility]       Future Appointments             In 1 week Duanne Limerick, MD [facility] at [facility], [facility]             metoprolol tartrate (LOPRESSOR) 50 MG tablet [Pharmacy Med Name: METOPROLOL TARTRATE 50 MG Tablet] 180 tablet 0    Sig: TAKE 1 TABLET TWICE DAILY     Cardiovascular:  Beta Blockers Passed - 10/29/2022 11:15 AM      Passed - Last BP in normal range    BP Readings from Last 1 Encounters:  10/21/22 124/72         Passed - Last  Heart Rate in normal range    Pulse Readings from Last 1 Encounters:  10/21/22 71         Passed - Valid encounter within last 6 months    Recent Outpatient Visits           1 week ago Essential hypertension   [place] [facility]   3 weeks ago Chronic rhinitis   [place] [facility], Ocie Bob, MD   1 month ago Essential hypertension   [place] [facility], [facility]   4 months ago Eczema, unspecified type   [facility]   6 months ago Essential hypertension    [facility] at [facility]       Future Appointments  In 1 week Duanne Limerick, MD [facility] Primary Care and Sports Medicine at [facility], [facility]

## 2022-11-01 NOTE — Telephone Encounter (Signed)
Requested medication (s) are due for refill today:routing for review  Requested medication (s) are on the active medication list: yes  Last refill:  09/23/22  Future visit scheduled: yes  Notes to clinic:  Unable to refill per protocol, last refill by another provider.      Requested Prescriptions  Pending Prescriptions Disp Refills   atorvastatin (LIPITOR) 40 MG tablet [Pharmacy Med Name: ATORVASTATIN CALCIUM 40 MG Tablet] 90 tablet 3    Sig: TAKE 1 TABLET EVERY DAY     Cardiovascular:  Antilipid - Statins Failed - 10/29/2022 11:15 AM      Failed - Lipid Panel in normal range within the last 12 months    Cholesterol, Total  Date Value Ref Range Status  05/04/2022 172 100 - 199 mg/dL Final   LDL Chol Calc (NIH)  Date Value Ref Range Status  05/04/2022 75 0 - 99 mg/dL Final   HDL  Date Value Ref Range Status  05/04/2022 64 >39 mg/dL Final   Triglycerides  Date Value Ref Range Status  05/04/2022 203 (H) 0 - 149 mg/dL Final         Passed - Patient is not pregnant      Passed - Valid encounter within last 12 months    Recent Outpatient Visits           1 week ago Essential hypertension   [place] [facility]   3 weeks ago Chronic rhinitis   [place] [facility], Ocie Bob, MD   1 month ago Essential hypertension   [place] [facility] MD   4 months ago Eczema, unspecified type   [facility]   6 months ago Essential hypertension   [place] [facility], MD       Future Appointments             In 1 week Duanne Limerick, MD [facility] Primary Care and [facility] Medicine at [facility], [facility]            Signed Prescriptions Disp Refills   metoprolol  tartrate (LOPRESSOR) 50 MG tablet 180 tablet 0    Sig: TAKE 1 TABLET TWICE DAILY     Cardiovascular:  Beta Blockers Passed - 10/29/2022 11:15 AM      Passed - Last BP in normal range    BP Readings from Last 1 Encounters:  10/21/22 124/72         Passed - Last Heart Rate in normal range    Pulse Readings from Last 1 Encounters:  10/21/22 71         Passed - Valid encounter within last 6 months    Recent Outpatient Visits           1 week ago Essential hypertension   [place] [facility]   3 weeks ago Chronic rhinitis   [place] [facility], Ocie Bob, MD   1 month ago Essential hypertension   [place] [facility]   4 months ago Eczema, unspecified type   [facility] Care and [facility] Medicine  at [facility]   6 months ago Essential hypertension   [place] [facility] at [facility], MD       Future Appointments             In 1 week Duanne Limerick, MD [facility] Medicine at [facility], [facility]

## 2022-11-01 NOTE — Telephone Encounter (Signed)
Medication Refill - Medication: pantoprazole (PROTONIX) 40 MG tablet   Has the patient contacted their pharmacy? Yes.     Preferred Pharmacy (with phone number or street name): [facility] - [place], [place] - [address] Phone: [phone]  Fax: [phone]   Has the patient been seen for an appointment in the last year OR does the patient have an upcoming appointment? Yes.     The patient was also checking on her metoprolol tartrate (LOPRESSOR) 50 MG tablet that a request had already been sent in. Please assist patient further.

## 2022-11-02 MED ORDER — PANTOPRAZOLE SODIUM 40 MG PO TBEC: 40.0000 mg | DELAYED_RELEASE_TABLET | Freq: Every day | ORAL | 0 refills | Status: DC

## 2022-11-02 NOTE — Telephone Encounter (Signed)
Requested Prescriptions  Pending Prescriptions Disp Refills   pantoprazole (PROTONIX) 40 MG tablet 90 tablet 0    Sig: Take 1 tablet (40 mg total) by mouth daily.     Gastroenterology: Proton Pump Inhibitors Passed - 11/01/2022  3:15 PM      Passed - Valid encounter within last 12 months    Recent Outpatient Visits           1 week ago Essential hypertension   [place] [facility]   3 weeks ago Chronic rhinitis   [place] [facility], Ocie Bob, MD   1 month ago Essential hypertension   [place] [facility], MD   4 months ago Eczema, unspecified type   [facility]   6 months ago Essential hypertension   [place] [facility], MD       Future Appointments             In 6 days Duanne Limerick, MD [facility] Primary Care and Sports Medicine at [facility], [facility]

## 2022-11-02 NOTE — Telephone Encounter (Signed)
Requested medications are due for refill today.  unsure  Requested medications are on the active medications list.  yes  Last refill. 10/08/2022 29mL  Future visit scheduled.   yes  Notes to clinic.  Pharmacy comment: REQUEST FOR 90 DAYS PRESCRIPTION. DX Code Needed.     Requested Prescriptions  Pending Prescriptions Disp Refills   flunisolide (NASALIDE) 25 MCG/ACT (0.025%) SOLN [Pharmacy Med Name: FLUNISOLIDE 0.025% SPRAY]  1    Sig: Place 2 sprays into the nose 2 (two) times daily.     Ear, Nose, and Throat: Nasal Preparations - Corticosteroids Passed - 10/30/2022  9:34 AM      Passed - Valid encounter within last 12 months    Recent Outpatient Visits           1 week ago Essential hypertension   [place] [facility]   3 weeks ago Chronic rhinitis   [place] [facility], Ocie Bob, MD   1 month ago Essential hypertension   [place] [facility], MD   4 months ago Eczema, unspecified type   [facility], MD   6 months ago Essential hypertension    [facility] Medicine at [facility], [facility]       Future Appointments             In 6 days Duanne Limerick, MD [facility] Primary Care and Sports Medicine at [facility], [facility]

## 2022-11-08 ENCOUNTER — Ambulatory Visit (INDEPENDENT_AMBULATORY_CARE_PROVIDER_SITE_OTHER): Payer: Medicare HMO | Admitting: Family Medicine

## 2022-11-08 ENCOUNTER — Encounter: Payer: Self-pay | Admitting: Family Medicine

## 2022-11-08 VITALS — BP 139/76 | HR 73 | Ht 61.0 in | Wt 133.0 lb

## 2022-11-08 DIAGNOSIS — E1129 Type 2 diabetes mellitus with other diabetic kidney complication: Secondary | ICD-10-CM

## 2022-11-08 DIAGNOSIS — R1011 Right upper quadrant pain: Secondary | ICD-10-CM

## 2022-11-08 DIAGNOSIS — I10 Essential (primary) hypertension: Secondary | ICD-10-CM

## 2022-11-08 DIAGNOSIS — R809 Proteinuria, unspecified: Secondary | ICD-10-CM | POA: Diagnosis not present

## 2022-11-08 DIAGNOSIS — E785 Hyperlipidemia, unspecified: Secondary | ICD-10-CM

## 2022-11-08 MED ORDER — GEMFIBROZIL 600 MG PO TABS: ORAL_TABLET | ORAL | 1 refills | Status: DC

## 2022-11-08 MED ORDER — METOPROLOL TARTRATE 50 MG PO TABS: 50.0000 mg | ORAL_TABLET | Freq: Two times a day (BID) | ORAL | 1 refills | Status: DC

## 2022-11-08 MED ORDER — PANTOPRAZOLE SODIUM 40 MG PO TBEC: 40.0000 mg | DELAYED_RELEASE_TABLET | Freq: Every day | ORAL | 1 refills | Status: DC

## 2022-11-08 MED ORDER — HYDROCHLOROTHIAZIDE 25 MG PO TABS: 25.0000 mg | ORAL_TABLET | Freq: Every day | ORAL | 0 refills | Status: DC

## 2022-11-08 MED ORDER — ATORVASTATIN CALCIUM 40 MG PO TABS: 40.0000 mg | ORAL_TABLET | Freq: Every day | ORAL | 0 refills | Status: DC

## 2022-11-08 NOTE — Progress Notes (Signed)
Date:  11/08/2022   Name:  Judy Bolton   DOB:  01/28/1946   MRN:  [id number]   Chief Complaint: Hyperlipidemia, Hypertension, and Gastroesophageal Reflux  Hyperlipidemia This is a chronic problem. The current episode started more than 1 year ago. The problem is controlled. She has no history of chronic renal disease, diabetes, hypothyroidism, liver disease, obesity or nephrotic syndrome. There are no known factors aggravating her hyperlipidemia. Pertinent negatives include no chest pain, focal sensory loss, focal weakness, leg pain, myalgias or shortness of breath. Current antihyperlipidemic treatment includes statins and fibric acid derivatives. The current treatment provides moderate improvement of lipids. There are no compliance problems.  Risk factors for coronary artery disease include dyslipidemia and hypertension.  Hypertension This is a chronic problem. The current episode started more than 1 year ago. The problem has been gradually improving since onset. The problem is controlled. Pertinent negatives include no anxiety, blurred vision, chest pain, headaches, palpitations, PND or shortness of breath. There are no known risk factors for coronary artery disease. Past treatments include beta blockers and diuretics. The current treatment provides moderate improvement. There are no compliance problems.  There is no history of angina, kidney disease, CAD/MI, CVA, heart failure, left ventricular hypertrophy, PVD or retinopathy. There is no history of chronic renal disease, a hypertension causing med or renovascular disease.  Gastroesophageal Reflux She reports no chest pain, no coughing, no heartburn or no nausea. diarrhea. This is a chronic problem. The current episode started 1 to 4 weeks ago. The problem occurs occasionally. The symptoms are aggravated by certain foods. Pertinent negatives include no fatigue.    Lab Results  Component Value Date   NA 140 09/21/2022   K 4.3 09/21/2022    CO2 29 09/21/2022   GLUCOSE 132 (H) 09/21/2022   BUN 37 (H) 09/21/2022   CREATININE 1.33 (H) 09/21/2022   CALCIUM 9.9 09/21/2022   EGFR 50 (L) 05/04/2022   GFRNONAA 42 (L) 09/21/2022   Lab Results  Component Value Date   CHOL 172 05/04/2022   HDL 64 05/04/2022   LDLCALC 75 05/04/2022   TRIG 203 (H) 05/04/2022   CHOLHDL 2.8 01/24/2018   No results found for: "TSH" Lab Results  Component Value Date   HGBA1C 6.4 04/20/2022   Lab Results  Component Value Date   WBC 9.5 09/21/2022   HGB 14.1 09/21/2022   HCT 43.5 09/21/2022   MCV 86.1 09/21/2022   PLT 260 09/21/2022   Lab Results  Component Value Date   ALT 13 09/20/2022   AST 18 09/20/2022   ALKPHOS 115 09/20/2022   BILITOT 0.7 09/20/2022   No results found for: "25OHVITD2", "25OHVITD3", "VD25OH"   Review of Systems  Constitutional: Negative.  Negative for chills, fatigue, fever and unexpected weight change.  HENT:  Negative for congestion, ear discharge, ear pain, rhinorrhea, sinus pressure and sneezing.   Eyes:  Negative for blurred vision.  Respiratory:  Negative for cough, shortness of breath and stridor.   Cardiovascular:  Negative for chest pain, palpitations and PND.  Gastrointestinal:  Negative for blood in stool, constipation, diarrhea, heartburn and nausea.  Genitourinary:  Negative for dysuria, flank pain, frequency, hematuria, urgency and vaginal discharge.  Musculoskeletal:  Negative for arthralgias, back pain and myalgias.  Skin:  Negative for rash.  Neurological:  Negative for dizziness, focal weakness, weakness and headaches.  Hematological:  Negative for adenopathy. Does not bruise/bleed easily.  Psychiatric/Behavioral:  Negative for dysphoric mood. The patient is not nervous/anxious.  Patient Active Problem List   Diagnosis Date Noted   Chronic rhinitis 10/08/2022   Primary osteoarthritis of right hip 08/21/2021   Essential hypertension 08/30/2017   Gastroesophageal reflux disease 08/30/2017    Aortic atherosclerosis ([place]) 05/31/2017   PAD (peripheral artery disease) ([place]) 05/31/2017   Encounter for long-term (current) use of high-risk medication 12/28/2016   Seronegative rheumatoid arthritis ([place]) 12/28/2016   Bilateral hand pain 11/08/2016   Elevated C-reactive protein 11/08/2016   Swelling of joint of right hand 11/08/2016   Type 2 diabetes mellitus with microalbuminuria, without long-term current use of insulin ([place]) 07/06/2016   Acute anxiety 04/16/2016   Bronchitis 04/16/2016   Centrilobular emphysema ([place]) 04/16/2016   TIA (transient ischemic attack) 02/29/2016   Lumbar stenosis with neurogenic claudication 04/16/2015   Spondylosis of lumbar region without myelopathy or radiculopathy 04/16/2015   Hyperlipidemia 10/15/2014    Allergies  Allergen Reactions   Rosuvastatin Swelling    Neck swelling   Betadine [Povidone Iodine] Swelling   Codeine Itching   Cyclobenzaprine     Other reaction(s): Dizziness   Latex Itching and Swelling    (urinary catheter)   Losartan     intolerance   Meloxicam Swelling   Penicillins Other (See Comments)    Paralysis  Has patient had a PCN reaction causing immediate rash, facial/tongue/throat swelling, SOB or lightheadedness with hypotension: No Has patient had a PCN reaction causing severe rash involving mucus membranes or skin necrosis: No Has patient had a PCN reaction that required hospitalization Yes Has patient had a PCN reaction occurring within the last 10 years: No If all of the above answers are "NO", then may proceed with Cephalosporin use.   Valsartan     Past Surgical History:  Procedure Laterality Date   ABDOMINAL HYSTERECTOMY     ANTERIOR (CYSTOCELE) AND POSTERIOR REPAIR (RECTOCELE) WITH XENFORM GRAFT AND SACROSPINOUS FIXATION     APPENDECTOMY     BROW LIFT Bilateral 09/05/2018   Procedure: BLEPHAROPLASTY UPPER EYELID W/EXCESS SKIN;  Surgeon: Karle Starch, MD;  Location: [facility];  Service:  Ophthalmology;  Laterality: Bilateral;   CATARACT EXTRACTION W/PHACO Left 01/06/2022   Procedure: CATARACT EXTRACTION PHACO AND INTRAOCULAR LENS PLACEMENT ([facility]) LEFT DIABETIC 8.09 01:07.3;  Surgeon: Leandrew Koyanagi, MD;  Location: [facility];  Service: Ophthalmology;  Laterality: Left;   CATARACT EXTRACTION W/PHACO Right 01/20/2022   Procedure: CATARACT EXTRACTION PHACO AND INTRAOCULAR LENS PLACEMENT ([place]) RIGHT DIABETIC;  Surgeon: Leandrew Koyanagi, MD;  Location: [facility];  Service: Ophthalmology;  Laterality: Right;  6.03 01:02.2   CTR Bilateral    LUMBAR LAMINECTOMY/DECOMPRESSION MICRODISCECTOMY N/A 07/27/2017   Procedure: LUMBAR LAMINECTOMY/DECOMPRESSION MICRODISCECTOMY 3 LEVELS-L3-S1;  Surgeon: Meade Maw, MD;  Location: [facility];  Service: Neurosurgery;  Laterality: N/A;   PTOSIS REPAIR Bilateral 09/05/2018   Procedure: PTOSIS REPAIR RESECT EX;  Surgeon: Karle Starch, MD;  Location: [facility];  Service: Ophthalmology;  Laterality: Bilateral;  Diabetic - oral meds sleep apnea Latexd allergy   ROTATOR CUFF REPAIR Right     Social History   Tobacco Use   Smoking status: Former    Packs/day: 1.50    Years: 28.00    Total pack years: 42.00    Types: Cigarettes    Quit date: 08/14/2000    Years since quitting: 22.2   Smokeless tobacco: Never   Tobacco comments:    smoking cessatiuon materials not required  Vaping Use   Vaping Use: Never used  Substance Use Topics  Alcohol use: No   Drug use: No     Medication list has been reviewed and updated.  No outpatient medications have been marked as taking for the 11/08/22 encounter (Office Visit) with Juline Patch, MD.       11/08/2022    1:45 PM 10/21/2022    2:06 PM 09/23/2022    4:18 PM 06/21/2022    1:40 PM  GAD 7 : Generalized Anxiety Score  Nervous, Anxious, on Edge 0 0 0 0  Control/stop worrying 0 0 0 0  Worry too much - different things 0 0 0 1  Trouble relaxing 0 0 0 1   Restless 0 0 0 0  Easily annoyed or irritable 0 0 0 0  Afraid - awful might happen 0 0 0 0  Total GAD 7 Score 0 0 0 2  Anxiety Difficulty Not difficult at all Not difficult at all Not difficult at all Not difficult at all       11/08/2022    1:45 PM 10/21/2022    2:05 PM 09/23/2022    4:18 PM  Depression screen PHQ 2/9  Decreased Interest 0 0 0  Down, Depressed, Hopeless 0 0 0  PHQ - 2 Score 0 0 0  Altered sleeping 0 0 0  Tired, decreased energy 0 0 0  Change in appetite 0 0 0  Feeling bad or failure about yourself  0 0 0  Trouble concentrating 0 0 0  Moving slowly or fidgety/restless 0 0 0  Suicidal thoughts 0 0 0  PHQ-9 Score 0 0 0  Difficult doing work/chores Not difficult at all Not difficult at all     BP Readings from Last 3 Encounters:  11/08/22 (!) 142/76  10/21/22 124/72  10/08/22 (!) 138/90    Physical Exam Vitals and nursing note reviewed. Exam conducted with a chaperone present.  Constitutional:      General: She is not in acute distress.    Appearance: She is not diaphoretic.  HENT:     Head: Normocephalic and atraumatic.     Right Ear: External ear normal.     Left Ear: External ear normal.     Nose: Nose normal.  Eyes:     General:        Right eye: No discharge.        Left eye: No discharge.     Conjunctiva/sclera: Conjunctivae normal.     Pupils: Pupils are equal, round, and reactive to light.  Neck:     Thyroid: No thyromegaly.     Vascular: No JVD.  Cardiovascular:     Rate and Rhythm: Normal rate and regular rhythm.     Heart sounds: Normal heart sounds. No murmur heard.    No friction rub. No gallop.  Pulmonary:     Effort: Pulmonary effort is normal.     Breath sounds: Normal breath sounds.  Abdominal:     General: Bowel sounds are normal.     Palpations: Abdomen is soft. There is no mass.     Tenderness: There is no abdominal tenderness. There is no guarding.  Musculoskeletal:        General: Normal range of motion.      Cervical back: Normal range of motion and neck supple.  Lymphadenopathy:     Cervical: No cervical adenopathy.  Skin:    General: Skin is warm and dry.  Neurological:     Mental Status: She is alert.     Deep Tendon Reflexes:  Reflexes are normal and symmetric.     Wt Readings from Last 3 Encounters:  11/08/22 133 lb (60.3 kg)  10/21/22 132 lb (59.9 kg)  10/08/22 134 lb (60.8 kg)    BP (!) 142/76 (BP Location: Right Arm, Cuff Size: Large)   Pulse 73   Ht _0  (1.549 m)   Wt 133 lb (60.3 kg)   SpO2 95%   BMI 25.13 kg/m   Assessment and Plan:  1. Essential hypertension Chronic.  Controlled.  Stable.  Asymptomatic.  Tolerating medications well.  Blood pressure 139/76.  Continue hydrochlorothiazide 25 mg and metoprolol 50 mg once a day.  Will recheck in 6 months. - hydrochlorothiazide (HYDRODIURIL) 25 MG tablet; Take 1 tablet (25 mg total) by mouth daily.  Dispense: 90 tablet; Refill: 0 - metoprolol tartrate (LOPRESSOR) 50 MG tablet; Take 1 tablet (50 mg total) by mouth 2 (two) times daily.  Dispense: 180 tablet; Refill: 1  2. Hyperlipidemia, unspecified hyperlipidemia type Chronic.  Controlled.  Stable.  Continue atorvastatin 40 mg once a day and gemfibrozil 60 mg twice a day. - atorvastatin (LIPITOR) 40 MG tablet; Take 1 tablet (40 mg total) by mouth daily.  Dispense: 90 tablet; Refill: 0 - gemfibrozil (LOPID) 600 MG tablet; TAKE 1 TABLET TWICE DAILY  Dispense: 180 tablet; Refill: 1  3. Right upper quadrant pain Has been consistent with gastritis in the past and controlled on pantoprazole 40 mg once a day. - pantoprazole (PROTONIX) 40 MG tablet; Take 1 tablet (40 mg total) by mouth daily.  Dispense: 90 tablet; Refill: 1  4. Type 2 diabetes mellitus with microalbuminuria, without long-term current use of insulin ([place]) Followed by endocrinology.  Will check microalbuminuria with creatinine urine ratio for status of GFR. - Microalbumin / creatinine urine ratio    Otilio Miu, MD

## 2022-11-12 ENCOUNTER — Ambulatory Visit: Payer: Self-pay

## 2022-11-12 NOTE — Telephone Encounter (Signed)
Patient called, left VM to return the call to the office to discuss symptoms with a nurse.  Summary: cough and congestion   Pt called and asked if Judy Bolton can send in cough syrup and a Z pack/ she stated she has a cough and congestion / please advise

## 2022-11-12 NOTE — Telephone Encounter (Signed)
Noted  KP 

## 2022-11-12 NOTE — Telephone Encounter (Signed)
  Chief Complaint: cough Symptoms: cough congestion and SOB at times  Frequency: cough ongoing since COVID, congestion since Wednesday Pertinent Negatives: Patient denies fever Disposition: [] ED /[] Urgent Care (no appt availability in office) / [] Appointment(In office/virtual)/ []  [facility]/ [] Home Care/ [x] Refused Recommended Disposition /[] [facility]/ [x]  Follow-up with PCP Additional Notes: pt states she was just in office on Monday so didn't want to come back in. Pt has been taking Delsym Tylenol and having to use neb tx 3x/day. Advised pt I would send message back to provider for review and have nurse FU with her. Pt verbalized understanding.     Summary: cough and congestion    Pt called and asked if can send in cough syrup and a Z pack/ she stated she has a cough and congestion / please advise       Reason for Disposition . [1] MILD difficulty breathing (e.g., minimal/no SOB at rest, SOB with walking, pulse <100) AND [2] still present when not coughing  Answer Assessment - Initial Assessment Questions 1. ONSET: "When did the cough begin?"      Ongoing since had COVID months ago  3. SPUTUM: "Describe the color of your sputum" (none, dry cough; clear, white, yellow, green)     Yellow mucus  5. DIFFICULTY BREATHING: "Are you having difficulty breathing?" If Yes, ask: "How bad is it?" (e.g., mild, moderate, severe)    - MILD: No SOB at rest, mild SOB with walking, speaks normally in sentences, can lie down, no retractions, pulse < 100.    - MODERATE: SOB at rest, SOB with minimal exertion and prefers to sit, cannot lie down flat, speaks in phrases, mild retractions, audible wheezing, pulse 100-120.    - SEVERE: Very SOB at rest, speaks in single words, struggling to breathe, sitting hunched forward, retractions, pulse > 120      Mild at times  6. FEVER: "Do you have a fever?" If Yes, ask: "What is your temperature, how was it measured, and when did it  start?"     no 8. LUNG HISTORY: "Do you have any history of lung disease?"  (e.g., pulmonary embolus, asthma, emphysema)     Yes COPD and asthma  10. OTHER SYMPTOMS: "Do you have any other symptoms?" (e.g., runny nose, wheezing, chest pain)       Delsym, inhalers, neb, Tylenol  Protocols used: Cough - Acute Productive-A-AH

## 2022-12-07 DIAGNOSIS — R053 Chronic cough: Secondary | ICD-10-CM | POA: Diagnosis not present

## 2022-12-07 DIAGNOSIS — R062 Wheezing: Secondary | ICD-10-CM | POA: Diagnosis not present

## 2023-01-12 DIAGNOSIS — J4489 Other specified chronic obstructive pulmonary disease: Secondary | ICD-10-CM | POA: Diagnosis not present

## 2023-01-12 DIAGNOSIS — R0789 Other chest pain: Secondary | ICD-10-CM | POA: Diagnosis not present

## 2023-02-21 ENCOUNTER — Other Ambulatory Visit: Payer: Self-pay | Admitting: Family Medicine

## 2023-02-21 DIAGNOSIS — J301 Allergic rhinitis due to pollen: Secondary | ICD-10-CM

## 2023-02-22 NOTE — Telephone Encounter (Signed)
Requested medication (s) are due for refill today: review  Requested medication (s) are on the active medication list: yes  Last refill:  09/23/22  Future visit scheduled: yes  Notes to clinic:  historical med, please advise.      Requested Prescriptions  Pending Prescriptions Disp Refills   amLODipine (NORVASC) 5 MG tablet [Pharmacy Med Name: AMLODIPINE BESYLATE 5 MG Tablet] 90 tablet 3    Sig: TAKE 1 TABLET EVERY DAY     Cardiovascular: Calcium Channel Blockers 2 Passed - 02/21/2023  1:04 PM      Passed - Last BP in normal range    BP Readings from Last 1 Encounters:  11/08/22 139/76         Passed - Last Heart Rate in normal range    Pulse Readings from Last 1 Encounters:  11/08/22 73         Passed - Valid encounter within last 6 months    Recent Outpatient Visits           3 months ago Essential hypertension   [facility] [place], [place], [place], [place]   4 months ago Essential hypertension   [facility] at [place], [place], MD   4 months ago Chronic rhinitis   [facility] at [place], [place], MD   5 months ago Essential hypertension   [facility] [place], [place], MD   8 months ago Eczema, unspecified type   [facility] [place], [place], MD       Future Appointments             In 2 months Juline Patch, MD [place] at [facility], [facility]            Signed Prescriptions Disp Refills   montelukast (SINGULAIR) 10 MG tablet 90 tablet 0    Sig: TAKE 1 TABLET EVERY DAY     Pulmonology:  Leukotriene Inhibitors Passed - 02/21/2023  1:04 PM      Passed - Valid encounter within last 12 months    Recent Outpatient Visits           3 months ago Essential hypertension   [facility] [place], [place], MD   4 months ago Essential hypertension   [facility] [place], [place], MD   4 months ago Chronic rhinitis   [facility] [place], [place], MD   5 months ago Essential hypertension   [place] [facility] [place], [place], MD   8 months ago Eczema, unspecified type   [facility] [place], [place], MD       Future Appointments             In 2 months Juline Patch, MD [place] at [facility], [facility]

## 2023-02-22 NOTE — Telephone Encounter (Signed)
Requested Prescriptions  Pending Prescriptions Disp Refills   montelukast (SINGULAIR) 10 MG tablet [Pharmacy Med Name: MONTELUKAST SODIUM 10 MG Tablet] 90 tablet 3    Sig: TAKE 1 TABLET EVERY DAY     Pulmonology:  Leukotriene Inhibitors Passed - 02/21/2023  1:04 PM      Passed - Valid encounter within last 12 months    Recent Outpatient Visits           3 months ago Essential hypertension   [facility] [place], [place], MD   4 months ago Essential hypertension   [facility] [place], [place], MD   4 months ago Chronic rhinitis   [facility] [place], [place], MD   5 months ago Essential hypertension   [place] [place], [place], MD   8 months ago Eczema, unspecified type   [facility] at [place], [place], MD       Future Appointments             In 2 months Juline Patch, MD [place] at [place], [place]             amLODipine ([place]) 5 MG tablet [Pharmacy Med Name: AMLODIPINE BESYLATE 5 MG Tablet] 90 tablet 3    Sig: TAKE 1 TABLET EVERY DAY     Cardiovascular: Calcium Channel Blockers 2 Passed - 02/21/2023  1:04 PM      Passed - Last BP in normal range    BP Readings from Last 1 Encounters:  11/08/22 139/76         Passed - Last Heart Rate in normal range    Pulse Readings from Last 1 Encounters:  11/08/22 73         Passed - Valid encounter within last 6 months    Recent Outpatient Visits           3 months ago Essential hypertension   [facility] [place], [place], MD   4 months ago Essential hypertension   [place] [facility] [place], [place], MD   4 months ago Chronic rhinitis    [facility] [place], [place], MD   5 months ago Essential hypertension   [facility] at [place], [place], MD   8 months ago Eczema, unspecified type   [facility] [place], [place], MD       Future Appointments             In 2 months Juline Patch, MD [place] at [facility], [facility]

## 2023-02-25 ENCOUNTER — Encounter: Payer: Self-pay | Admitting: Internal Medicine

## 2023-02-25 ENCOUNTER — Other Ambulatory Visit: Payer: Self-pay | Admitting: Internal Medicine

## 2023-02-25 ENCOUNTER — Ambulatory Visit (INDEPENDENT_AMBULATORY_CARE_PROVIDER_SITE_OTHER): Payer: Medicare HMO | Admitting: Internal Medicine

## 2023-02-25 VITALS — BP 134/80 | HR 63 | Temp 97.5°F | Ht 61.0 in | Wt 134.0 lb

## 2023-02-25 DIAGNOSIS — N3000 Acute cystitis without hematuria: Secondary | ICD-10-CM

## 2023-02-25 DIAGNOSIS — E1159 Type 2 diabetes mellitus with other circulatory complications: Secondary | ICD-10-CM | POA: Diagnosis not present

## 2023-02-25 DIAGNOSIS — E1169 Type 2 diabetes mellitus with other specified complication: Secondary | ICD-10-CM | POA: Diagnosis not present

## 2023-02-25 DIAGNOSIS — R809 Proteinuria, unspecified: Secondary | ICD-10-CM | POA: Diagnosis not present

## 2023-02-25 DIAGNOSIS — I152 Hypertension secondary to endocrine disorders: Secondary | ICD-10-CM | POA: Diagnosis not present

## 2023-02-25 DIAGNOSIS — E538 Deficiency of other specified B group vitamins: Secondary | ICD-10-CM | POA: Diagnosis not present

## 2023-02-25 DIAGNOSIS — E785 Hyperlipidemia, unspecified: Secondary | ICD-10-CM | POA: Diagnosis not present

## 2023-02-25 DIAGNOSIS — E1129 Type 2 diabetes mellitus with other diabetic kidney complication: Secondary | ICD-10-CM | POA: Diagnosis not present

## 2023-02-25 LAB — POCT URINALYSIS DIPSTICK
Bilirubin, UA: NEGATIVE
Blood, UA: NEGATIVE
Glucose, UA: NEGATIVE
Ketones, UA: NEGATIVE
Nitrite, UA: POSITIVE
Protein, UA: POSITIVE — AB
Spec Grav, UA: >=1.03 — AB (ref 1.010–1.025)
Urobilinogen, UA: 0.2 E.U./dL
pH, UA: 6 (ref 5.0–8.0)

## 2023-02-25 LAB — HM DIABETES FOOT EXAM: HM Diabetic Foot Exam: NORMAL

## 2023-02-25 LAB — HEMOGLOBIN A1C: Hemoglobin A1C: 5.8

## 2023-02-25 MED ORDER — CIPROFLOXACIN HCL 250 MG PO TABS: 250.0000 mg | ORAL_TABLET | Freq: Two times a day (BID) | ORAL | 0 refills | Status: AC

## 2023-02-25 NOTE — Progress Notes (Signed)
Date:  02/25/2023   Name:  Judy Bolton   DOB:  1946/01/31   MRN:  [id number]   Chief Complaint: Urinary Tract Infection ( Discomfort in pelvic area, pressure, and burning when urinating.)  Urinary Tract Infection  This is a new problem. The problem occurs every urination. The quality of the pain is described as burning. The pain is mild. There has been no fever. Pertinent negatives include no chills, hematuria or urgency.    Lab Results  Component Value Date   NA 140 09/21/2022   K 4.3 09/21/2022   CO2 29 09/21/2022   GLUCOSE 132 (H) 09/21/2022   BUN 37 (H) 09/21/2022   CREATININE 1.33 (H) 09/21/2022   CALCIUM 9.9 09/21/2022   EGFR 50 (L) 05/04/2022   GFRNONAA 42 (L) 09/21/2022   Lab Results  Component Value Date   CHOL 172 05/04/2022   HDL 64 05/04/2022   LDLCALC 75 05/04/2022   TRIG 203 (H) 05/04/2022   CHOLHDL 2.8 01/24/2018   No results found for: "TSH" Lab Results  Component Value Date   HGBA1C 6.4 04/20/2022   Lab Results  Component Value Date   WBC 9.5 09/21/2022   HGB 14.1 09/21/2022   HCT 43.5 09/21/2022   MCV 86.1 09/21/2022   PLT 260 09/21/2022   Lab Results  Component Value Date   ALT 13 09/20/2022   AST 18 09/20/2022   ALKPHOS 115 09/20/2022   BILITOT 0.7 09/20/2022   No results found for: "25OHVITD2", "25OHVITD3", "VD25OH"   Review of Systems  Constitutional:  Negative for chills, fatigue and fever.  Respiratory:  Negative for chest tightness and shortness of breath.   Cardiovascular:  Negative for chest pain.  Gastrointestinal:  Positive for abdominal pain.  Genitourinary:  Positive for dysuria. Negative for hematuria and urgency.  Psychiatric/Behavioral:  Negative for dysphoric mood and sleep disturbance. The patient is not nervous/anxious.     Patient Active Problem List   Diagnosis Date Noted   Chronic rhinitis 10/08/2022   Primary osteoarthritis of right hip 08/21/2021   Essential hypertension 08/30/2017   Gastroesophageal  reflux disease 08/30/2017   Aortic atherosclerosis ([place]) 05/31/2017   PAD (peripheral artery disease) ([place]) 05/31/2017   Encounter for long-term (current) use of high-risk medication 12/28/2016   Seronegative rheumatoid arthritis ([place]) 12/28/2016   Bilateral hand pain 11/08/2016   Elevated C-reactive protein 11/08/2016   Swelling of joint of right hand 11/08/2016   Type 2 diabetes mellitus with microalbuminuria, without long-term current use of insulin ([place]) 07/06/2016   Acute anxiety 04/16/2016   Bronchitis 04/16/2016   Centrilobular emphysema ([place]) 04/16/2016   TIA (transient ischemic attack) 02/29/2016   Lumbar stenosis with neurogenic claudication 04/16/2015   Spondylosis of lumbar region without myelopathy or radiculopathy 04/16/2015   Hyperlipidemia 10/15/2014    Allergies  Allergen Reactions   Rosuvastatin Swelling    Neck swelling   Sulfa Antibiotics Itching   Betadine [Povidone Iodine] Swelling   Codeine Itching   Cyclobenzaprine     Other reaction(s): Dizziness   Latex Itching and Swelling    (urinary catheter)   Losartan     intolerance   Meloxicam Swelling   Penicillins Other (See Comments)    Paralysis  Has patient had a PCN reaction causing immediate rash, facial/tongue/throat swelling, SOB or lightheadedness with hypotension: No Has patient had a PCN reaction causing severe rash involving mucus membranes or skin necrosis: No Has patient had a PCN reaction that required hospitalization Yes Has patient had  a PCN reaction occurring within the last 10 years: No If all of the above answers are "NO", then may proceed with Cephalosporin use.   Valsartan     Past Surgical History:  Procedure Laterality Date   ABDOMINAL HYSTERECTOMY     ANTERIOR (CYSTOCELE) AND POSTERIOR REPAIR (RECTOCELE) WITH XENFORM GRAFT AND SACROSPINOUS FIXATION     APPENDECTOMY     BROW LIFT Bilateral 09/05/2018   Procedure: BLEPHAROPLASTY UPPER EYELID W/EXCESS SKIN;  Surgeon: Karle Starch,  MD;  Location: [place];  Service: Ophthalmology;  Laterality: Bilateral;   CATARACT EXTRACTION W/PHACO Left 01/06/2022   Procedure: CATARACT EXTRACTION PHACO AND INTRAOCULAR LENS PLACEMENT (IOC) LEFT DIABETIC 8.09 01:07.3;  Surgeon: Leandrew Koyanagi, MD;  Location: [place];  Service: Ophthalmology;  Laterality: Left;   CATARACT EXTRACTION W/PHACO Right 01/20/2022   Procedure: CATARACT EXTRACTION PHACO AND INTRAOCULAR LENS PLACEMENT ([place]) RIGHT DIABETIC;  Surgeon: Leandrew Koyanagi, MD;  Location: [place];  Service: Ophthalmology;  Laterality: Right;  6.03 01:02.2   CTR Bilateral    LUMBAR LAMINECTOMY/DECOMPRESSION MICRODISCECTOMY N/A 07/27/2017   Procedure: LUMBAR LAMINECTOMY/DECOMPRESSION MICRODISCECTOMY 3 LEVELS-L3-S1;  Surgeon: Meade Maw, MD;  Location: [facility];  Service: Neurosurgery;  Laterality: N/A;   PTOSIS REPAIR Bilateral 09/05/2018   Procedure: PTOSIS REPAIR RESECT EX;  Surgeon: Karle Starch, MD;  Location: [place];  Service: Ophthalmology;  Laterality: Bilateral;  Diabetic - oral meds sleep apnea Latexd allergy   ROTATOR CUFF REPAIR Right     Social History   Tobacco Use   Smoking status: Former    Packs/day: 1.50    Years: 28.00    Additional pack years: 0.00    Total pack years: 42.00    Types: Cigarettes    Quit date: 08/14/2000    Years since quitting: 22.5   Smokeless tobacco: Never   Tobacco comments:    smoking cessatiuon materials not required  Vaping Use   Vaping Use: Never used  Substance Use Topics   Alcohol use: No   Drug use: No     Medication list has been reviewed and updated.  Current Meds  Medication Sig   albuterol (PROVENTIL HFA;VENTOLIN HFA) 108 (90 Base) MCG/ACT inhaler Inhale 2 puffs into the lungs every 6 (six) hours as needed for wheezing or shortness of breath.   albuterol (PROVENTIL) (2.5 MG/3ML) 0.083% nebulizer solution Take 3 mLs (2.5 mg total) by nebulization every 6 (six)  hours as needed for wheezing or shortness of breath.   ALPRAZolam (XANAX) 0.25 MG tablet Take 1 tablet (0.25 mg total) by mouth 2 (two) times daily as needed for anxiety.   amLODipine (NORVASC) 5 MG tablet Take 5 mg by mouth daily.   atorvastatin (LIPITOR) 40 MG tablet Take 1 tablet (40 mg total) by mouth daily.   celecoxib (CELEBREX) 200 MG capsule Take 1 capsule by mouth daily.   cholecalciferol (VITAMIN D3) 25 MCG (1000 UNIT) tablet Take 1,000 Units by mouth daily.   ciprofloxacin (CIPRO) 250 MG tablet Take 1 tablet (250 mg total) by mouth 2 (two) times daily for 5 days.   FASENRA 30 MG/ML SOSY Once every 2 months for asthma. Paid for by [employer]   flunisolide (NASALIDE) 25 MCG/ACT (0.025%) SOLN Place 2 sprays into the nose 2 (two) times daily.   gabapentin (NEURONTIN) 300 MG capsule Take 1 capsule by mouth daily.   gemfibrozil (LOPID) 600 MG tablet TAKE 1 TABLET TWICE DAILY   hydrochlorothiazide (HYDRODIURIL) 25 MG tablet Take 1 tablet (25  mg total) by mouth daily.   ipratropium (ATROVENT) 0.02 % nebulizer solution Inhale into the lungs.   metFORMIN (GLUCOPHAGE) 500 MG tablet Take 500 mg by mouth 2 (two) times daily.   metoprolol tartrate (LOPRESSOR) 50 MG tablet Take 1 tablet (50 mg total) by mouth 2 (two) times daily.   montelukast (SINGULAIR) 10 MG tablet TAKE 1 TABLET EVERY DAY   nystatin cream (MYCOSTATIN) Apply 1 Application topically 2 (two) times daily.   pantoprazole (PROTONIX) 40 MG tablet Take 1 tablet (40 mg total) by mouth daily.   vitamin B-12 (CYANOCOBALAMIN) 1000 MCG tablet Take 1,000 mcg by mouth daily.       02/25/2023   10:24 AM 11/08/2022    1:45 PM 10/21/2022    2:06 PM 09/23/2022    4:18 PM  GAD 7 : Generalized Anxiety Score  Nervous, Anxious, on Edge 1 0 0 0  Control/stop worrying 1 0 0 0  Worry too much - different things 1 0 0 0  Trouble relaxing 0 0 0 0  Restless 0 0 0 0  Easily annoyed or irritable 0 0 0 0  Afraid - awful might happen 0 0 0 0  Total  GAD 7 Score 3 0 0 0  Anxiety Difficulty Not difficult at all Not difficult at all Not difficult at all Not difficult at all       02/25/2023   10:24 AM 11/08/2022    1:45 PM 10/21/2022    2:05 PM  Depression screen PHQ 2/9  Decreased Interest 0 0 0  Down, Depressed, Hopeless 0 0 0  PHQ - 2 Score 0 0 0  Altered sleeping 0 0 0  Tired, decreased energy 0 0 0  Change in appetite 0 0 0  Feeling bad or failure about yourself  0 0 0  Trouble concentrating 0 0 0  Moving slowly or fidgety/restless 0 0 0  Suicidal thoughts 0 0 0  PHQ-9 Score 0 0 0  Difficult doing work/chores Not difficult at all Not difficult at all Not difficult at all    BP Readings from Last 3 Encounters:  02/25/23 134/80  11/08/22 139/76  10/21/22 124/72    Physical Exam Vitals and nursing note reviewed.  Constitutional:      Appearance: Normal appearance. She is well-developed.  Cardiovascular:     Rate and Rhythm: Normal rate and regular rhythm.     Heart sounds: Normal heart sounds.  Pulmonary:     Effort: Pulmonary effort is normal. No respiratory distress.     Breath sounds: Normal breath sounds.  Abdominal:     General: Bowel sounds are normal.     Palpations: Abdomen is soft.     Tenderness: There is abdominal tenderness in the suprapubic area. There is no right CVA tenderness, left CVA tenderness, guarding or rebound.  Neurological:     Mental Status: She is alert.   Urine dipstick shows positive for WBC's and positive for nitrates.  Micro exam: not done.   Wt Readings from Last 3 Encounters:  02/25/23 134 lb (60.8 kg)  11/08/22 133 lb (60.3 kg)  10/21/22 132 lb (59.9 kg)    BP 134/80   Pulse 63   Temp (!) 97.5 F (36.4 C) (Oral)   Ht 5\' 1"  (1.549 m)   Wt 134 lb (60.8 kg)   SpO2 97%   BMI 25.32 kg/m   Assessment and Plan:  Problem List Items Addressed This Visit   None Visit Diagnoses  Acute cystitis without hematuria    -  Primary   continue to push fluids can take AZO up  to three days if needed will advise further when Cx resulted   Relevant Medications   ciprofloxacin (CIPRO) 250 MG tablet   Other Relevant Orders   POCT urinalysis dipstick (Completed)   Urine Culture       No follow-ups on file.   Partially dictated using [place], any errors are not intentional.  Glean Hess, MD [place], [place]

## 2023-03-01 LAB — URINE CULTURE

## 2023-03-14 DIAGNOSIS — J4489 Other specified chronic obstructive pulmonary disease: Secondary | ICD-10-CM | POA: Diagnosis not present

## 2023-03-14 DIAGNOSIS — K219 Gastro-esophageal reflux disease without esophagitis: Secondary | ICD-10-CM | POA: Diagnosis not present

## 2023-03-14 DIAGNOSIS — J301 Allergic rhinitis due to pollen: Secondary | ICD-10-CM | POA: Diagnosis not present

## 2023-03-19 ENCOUNTER — Other Ambulatory Visit: Payer: Self-pay | Admitting: Family Medicine

## 2023-03-19 DIAGNOSIS — I10 Essential (primary) hypertension: Secondary | ICD-10-CM

## 2023-03-19 DIAGNOSIS — E785 Hyperlipidemia, unspecified: Secondary | ICD-10-CM

## 2023-03-21 NOTE — Telephone Encounter (Signed)
Requested Prescriptions  Pending Prescriptions Disp Refills   hydrochlorothiazide (HYDRODIURIL) 25 MG tablet [Pharmacy Med Name: HYDROCHLOROTHIAZIDE 25 MG Tablet] 90 tablet 0    Sig: TAKE 1 TABLET EVERY DAY     Cardiovascular: Diuretics - Thiazide Failed - 03/19/2023  7:02 PM      Failed - Cr in normal range and within 180 days    Creatinine  Date Value Ref Range Status  02/29/2012 1.16 0.60 - 1.30 mg/dL Final   Creatinine, Ser  Date Value Ref Range Status  09/21/2022 1.33 (H) 0.44 - 1.00 mg/dL Final         Passed - K in normal range and within 180 days    Potassium  Date Value Ref Range Status  09/21/2022 4.3 3.5 - 5.1 mmol/L Final  08/08/2014 4.7 3.5 - 5.1 mmol/L Final         Passed - Na in normal range and within 180 days    Sodium  Date Value Ref Range Status  09/21/2022 140 135 - 145 mmol/L Final  05/04/2022 141 134 - 144 mmol/L Final  02/29/2012 142 136 - 145 mmol/L Final         Passed - Last BP in normal range    BP Readings from Last 1 Encounters:  02/25/23 134/80         Passed - Valid encounter within last 6 months    Recent Outpatient Visits           3 weeks ago Acute cystitis without hematuria   [place] [facility], Nyoka Cowden, MD   4 months ago Essential hypertension   [place] [facility]   5 months ago Essential hypertension   [place] [facility]   5 months ago Chronic rhinitis   [place] [facility], Ocie Bob, MD   5 months ago Essential hypertension    [facility], [facility]       Future Appointments             In 1 month Duanne Limerick, MD [facility]             atorvastatin (LIPITOR) 40 MG tablet  [Pharmacy Med Name: ATORVASTATIN CALCIUM 40 MG Tablet] 90 tablet 0    Sig: TAKE 1 TABLET EVERY DAY     Cardiovascular:  Antilipid - Statins Failed - 03/19/2023  7:02 PM      Failed - Lipid Panel in normal range within the last 12 months    Cholesterol, Total  Date Value Ref Range Status  05/04/2022 172 100 - 199 mg/dL Final   LDL Chol Calc (NIH)  Date Value Ref Range Status  05/04/2022 75 0 - 99 mg/dL Final   HDL  Date Value Ref Range Status  05/04/2022 64 >39 mg/dL Final   Triglycerides  Date Value Ref Range Status  05/04/2022 203 (H) 0 - 149 mg/dL Final         Passed - Patient is not pregnant      Passed - Valid encounter within last 12 months    Recent Outpatient Visits           3 weeks ago Acute cystitis without hematuria  [facility], Nyoka Cowden, MD   4 months ago Essential hypertension   [place] [facility], [facility]   5 months ago Essential hypertension   [place] [facility], MD   5 months ago Chronic rhinitis   [place] [facility], Ocie Bob, MD   5 months ago Essential hypertension   [facility], [facility]       Future Appointments             In 1 month Duanne Limerick, MD [facility] at [facility]

## 2023-04-04 ENCOUNTER — Telehealth: Payer: Self-pay | Admitting: Family Medicine

## 2023-04-04 NOTE — Telephone Encounter (Signed)
Contacted Judy Bolton to schedule their annual wellness visit. Call back at later date: 04/05/2023.  Spoke with patient to r/s AWV on 04/04/23  *(signature)*

## 2023-04-05 ENCOUNTER — Encounter: Payer: Self-pay | Admitting: Family Medicine

## 2023-04-05 ENCOUNTER — Ambulatory Visit (INDEPENDENT_AMBULATORY_CARE_PROVIDER_SITE_OTHER): Payer: Medicare HMO | Admitting: Family Medicine

## 2023-04-05 VITALS — BP 128/78 | HR 64 | Temp 98.3°F | Ht 61.0 in | Wt 135.0 lb

## 2023-04-05 DIAGNOSIS — J0100 Acute maxillary sinusitis, unspecified: Secondary | ICD-10-CM | POA: Diagnosis not present

## 2023-04-05 DIAGNOSIS — R051 Acute cough: Secondary | ICD-10-CM

## 2023-04-05 DIAGNOSIS — J4521 Mild intermittent asthma with (acute) exacerbation: Secondary | ICD-10-CM | POA: Diagnosis not present

## 2023-04-05 MED ORDER — PROMETHAZINE-DM 6.25-15 MG/5ML PO SYRP: 5.0000 mL | ORAL_SOLUTION | Freq: Four times a day (QID) | ORAL | 0 refills | Status: DC | PRN

## 2023-04-05 MED ORDER — AZITHROMYCIN 250 MG PO TABS: ORAL_TABLET | ORAL | 0 refills | Status: AC

## 2023-04-05 MED ORDER — PREDNISONE 10 MG PO TABS: 10.0000 mg | ORAL_TABLET | Freq: Every day | ORAL | 0 refills | Status: DC

## 2023-04-05 NOTE — Progress Notes (Unsigned)
Date:  04/05/2023   Name:  Judy Bolton   DOB:  March 31, 1946   MRN:  [id number]   Chief Complaint: Sore Throat (Started Friday night - has not taken a covid test. Started w/ a sore throat. Cough with yellow production. No fever. SOB but she has COPD.)  Sinusitis This is a new problem. The current episode started in the past 7 days. The problem has been gradually improving since onset. There has been no fever. The pain is mild. Associated symptoms include chills, congestion, coughing, shortness of breath, sinus pressure and a sore throat. Pertinent negatives include no ear pain or headaches. Past treatments include antibiotics. The treatment provided mild relief.    Lab Results  Component Value Date   NA 140 09/21/2022   K 4.3 09/21/2022   CO2 29 09/21/2022   GLUCOSE 132 (H) 09/21/2022   BUN 37 (H) 09/21/2022   CREATININE 1.33 (H) 09/21/2022   CALCIUM 9.9 09/21/2022   EGFR 50 (L) 05/04/2022   GFRNONAA 42 (L) 09/21/2022   Lab Results  Component Value Date   CHOL 172 05/04/2022   HDL 64 05/04/2022   LDLCALC 75 05/04/2022   TRIG 203 (H) 05/04/2022   CHOLHDL 2.8 01/24/2018   No results found for: "TSH" Lab Results  Component Value Date   HGBA1C 6.4 04/20/2022   Lab Results  Component Value Date   WBC 9.5 09/21/2022   HGB 14.1 09/21/2022   HCT 43.5 09/21/2022   MCV 86.1 09/21/2022   PLT 260 09/21/2022   Lab Results  Component Value Date   ALT 13 09/20/2022   AST 18 09/20/2022   ALKPHOS 115 09/20/2022   BILITOT 0.7 09/20/2022   No results found for: "25OHVITD2", "25OHVITD3", "VD25OH"   Review of Systems  Constitutional:  Positive for chills.  HENT:  Positive for congestion, postnasal drip, rhinorrhea, sinus pressure, sinus pain and sore throat. Negative for ear pain.   Eyes:  Negative for discharge, itching and visual disturbance.  Respiratory:  Positive for cough, shortness of breath and wheezing.   Cardiovascular:  Negative for chest pain and palpitations.   Neurological:  Negative for headaches.    Patient Active Problem List   Diagnosis Date Noted   Chronic rhinitis 10/08/2022   Primary osteoarthritis of right hip 08/21/2021   Essential hypertension 08/30/2017   Gastroesophageal reflux disease 08/30/2017   Aortic atherosclerosis (HCC) 05/31/2017   PAD (peripheral artery disease) (HCC) 05/31/2017   Encounter for long-term (current) use of high-risk medication 12/28/2016   Seronegative rheumatoid arthritis (HCC) 12/28/2016   Bilateral hand pain 11/08/2016   Elevated C-reactive protein 11/08/2016   Swelling of joint of right hand 11/08/2016   Type 2 diabetes mellitus with microalbuminuria, without long-term current use of insulin (HCC) 07/06/2016   Acute anxiety 04/16/2016   Bronchitis 04/16/2016   Centrilobular emphysema (HCC) 04/16/2016   TIA (transient ischemic attack) 02/29/2016   Lumbar stenosis with neurogenic claudication 04/16/2015   Spondylosis of lumbar region without myelopathy or radiculopathy 04/16/2015   Hyperlipidemia 10/15/2014    Allergies  Allergen Reactions   Rosuvastatin Swelling    Neck swelling   Sulfa Antibiotics Itching   Betadine [Povidone Iodine] Swelling   Codeine Itching   Cyclobenzaprine     Other reaction(s): Dizziness   Latex Itching and Swelling    (urinary catheter)   Losartan     intolerance   Meloxicam Swelling   Penicillins Other (See Comments)    Paralysis  Has patient had a PCN  reaction causing immediate rash, facial/tongue/throat swelling, SOB or lightheadedness with hypotension: No Has patient had a PCN reaction causing severe rash involving mucus membranes or skin necrosis: No Has patient had a PCN reaction that required hospitalization Yes Has patient had a PCN reaction occurring within the last 10 years: No If all of the above answers are "NO", then may proceed with Cephalosporin use.   Valsartan     Past Surgical History:  Procedure Laterality Date   ABDOMINAL HYSTERECTOMY      ANTERIOR (CYSTOCELE) AND POSTERIOR REPAIR (RECTOCELE) WITH XENFORM GRAFT AND SACROSPINOUS FIXATION     APPENDECTOMY     BROW LIFT Bilateral 09/05/2018   Procedure: BLEPHAROPLASTY UPPER EYELID W/EXCESS SKIN;  Surgeon: Imagene Riches, MD;  Location: [facility];  Service: Ophthalmology;  Laterality: Bilateral;   CATARACT EXTRACTION W/PHACO Left 01/06/2022   Procedure: CATARACT EXTRACTION PHACO AND INTRAOCULAR LENS PLACEMENT (IOC) LEFT DIABETIC 8.09 01:07.3;  Surgeon: Lockie Mola, MD;  Location: [facility];  Service: Ophthalmology;  Laterality: Left;   CATARACT EXTRACTION W/PHACO Right 01/20/2022   Procedure: CATARACT EXTRACTION PHACO AND INTRAOCULAR LENS PLACEMENT (IOC) RIGHT DIABETIC;  Surgeon: Lockie Mola, MD;  Location: [facility];  Service: Ophthalmology;  Laterality: Right;  6.03 01:02.2   CTR Bilateral    LUMBAR LAMINECTOMY/DECOMPRESSION MICRODISCECTOMY N/A 07/27/2017   Procedure: LUMBAR LAMINECTOMY/DECOMPRESSION MICRODISCECTOMY 3 LEVELS-L3-S1;  Surgeon: Venetia Night, MD;  Location: [facility];  Service: Neurosurgery;  Laterality: N/A;   PTOSIS REPAIR Bilateral 09/05/2018   Procedure: PTOSIS REPAIR RESECT EX;  Surgeon: Imagene Riches, MD;  Location: [facility];  Service: Ophthalmology;  Laterality: Bilateral;  Diabetic - oral meds sleep apnea Latexd allergy   ROTATOR CUFF REPAIR Right     Social History   Tobacco Use   Smoking status: Former    Packs/day: 1.50    Years: 28.00    Additional pack years: 0.00    Total pack years: 42.00    Types: Cigarettes    Quit date: 08/14/2000    Years since quitting: 22.6   Smokeless tobacco: Never   Tobacco comments:    smoking cessatiuon materials not required  Vaping Use   Vaping Use: Never used  Substance Use Topics   Alcohol use: No   Drug use: No     Medication list has been reviewed and updated.  Current Meds  Medication Sig   albuterol (PROVENTIL HFA;VENTOLIN HFA) 108 (90  Base) MCG/ACT inhaler Inhale 2 puffs into the lungs every 6 (six) hours as needed for wheezing or shortness of breath.   albuterol (PROVENTIL) (2.5 MG/3ML) 0.083% nebulizer solution Take 3 mLs (2.5 mg total) by nebulization every 6 (six) hours as needed for wheezing or shortness of breath.   ALPRAZolam (XANAX) 0.25 MG tablet Take 1 tablet (0.25 mg total) by mouth 2 (two) times daily as needed for anxiety.   amLODipine (NORVASC) 5 MG tablet TAKE 1 TABLET EVERY DAY   atorvastatin (LIPITOR) 40 MG tablet TAKE 1 TABLET EVERY DAY   celecoxib (CELEBREX) 200 MG capsule Take 1 capsule by mouth daily.   cholecalciferol (VITAMIN D3) 25 MCG (1000 UNIT) tablet Take 1,000 Units by mouth daily.   FASENRA 30 MG/ML SOSY Once every 2 months for asthma. Paid for by [employer]   flunisolide (NASALIDE) 25 MCG/ACT (0.025%) SOLN Place 2 sprays into the nose 2 (two) times daily.   gabapentin (NEURONTIN) 300 MG capsule Take 1 capsule by mouth daily.   gemfibrozil (LOPID) 600 MG tablet TAKE 1  TABLET TWICE DAILY   hydrochlorothiazide (HYDRODIURIL) 25 MG tablet TAKE 1 TABLET EVERY DAY   ipratropium (ATROVENT) 0.02 % nebulizer solution Inhale into the lungs.   metFORMIN (GLUCOPHAGE) 500 MG tablet Take 500 mg by mouth 2 (two) times daily.   metoprolol tartrate (LOPRESSOR) 50 MG tablet Take 1 tablet (50 mg total) by mouth 2 (two) times daily.   montelukast (SINGULAIR) 10 MG tablet TAKE 1 TABLET EVERY DAY   nystatin cream (MYCOSTATIN) Apply 1 Application topically 2 (two) times daily.   pantoprazole (PROTONIX) 40 MG tablet Take 1 tablet (40 mg total) by mouth daily.   vitamin B-12 (CYANOCOBALAMIN) 1000 MCG tablet Take 1,000 mcg by mouth daily.       02/25/2023   10:24 AM 11/08/2022    1:45 PM 10/21/2022    2:06 PM 09/23/2022    4:18 PM  GAD 7 : Generalized Anxiety Score  Nervous, Anxious, on Edge 1 0 0 0  Control/stop worrying 1 0 0 0  Worry too much - different things 1 0 0 0  Trouble relaxing 0 0 0 0  Restless 0 0  0 0  Easily annoyed or irritable 0 0 0 0  Afraid - awful might happen 0 0 0 0  Total GAD 7 Score 3 0 0 0  Anxiety Difficulty Not difficult at all Not difficult at all Not difficult at all Not difficult at all       02/25/2023   10:24 AM 11/08/2022    1:45 PM 10/21/2022    2:05 PM  Depression screen PHQ 2/9  Decreased Interest 0 0 0  Down, Depressed, Hopeless 0 0 0  PHQ - 2 Score 0 0 0  Altered sleeping 0 0 0  Tired, decreased energy 0 0 0  Change in appetite 0 0 0  Feeling bad or failure about yourself  0 0 0  Trouble concentrating 0 0 0  Moving slowly or fidgety/restless 0 0 0  Suicidal thoughts 0 0 0  PHQ-9 Score 0 0 0  Difficult doing work/chores Not difficult at all Not difficult at all Not difficult at all    BP Readings from Last 3 Encounters:  04/05/23 128/78  02/25/23 134/80  11/08/22 139/76    Physical Exam Vitals and nursing note reviewed.  HENT:     Head: Normocephalic.     Right Ear: Tympanic membrane and ear canal normal. No drainage.     Left Ear: Tympanic membrane and ear canal normal. No drainage.     Nose: No congestion or rhinorrhea.     Mouth/Throat:     Mouth: Mucous membranes are moist.     Pharynx: No posterior oropharyngeal erythema.     Tonsils: No tonsillar exudate.  Eyes:     Conjunctiva/sclera: Conjunctivae normal.  Neck:     Thyroid: No thyromegaly.  Cardiovascular:     Heart sounds: No murmur heard.    No friction rub. No gallop.  Pulmonary:     Breath sounds: Examination of the right-upper field reveals rales. Rales present. No decreased breath sounds, wheezing or rhonchi.  Musculoskeletal:     Cervical back: Normal range of motion and neck supple.  Lymphadenopathy:     Cervical: No cervical adenopathy.     Wt Readings from Last 3 Encounters:  04/05/23 135 lb (61.2 kg)  02/25/23 134 lb (60.8 kg)  11/08/22 133 lb (60.3 kg)    BP 128/78   Pulse 64   Temp 98.3 F (36.8 C) (Oral)  Ht 5\' 1"  (1.549 m)   Wt 135 lb (61.2 kg)    SpO2 98%   BMI 25.51 kg/m   Assessment and Plan: 1. Acute maxillary sinusitis, recurrence not specified New onset.  Persistent.  Stable.  Will treat with azithromycin to 50 mg 2 today followed by 1 a day for 4 days. - azithromycin (ZITHROMAX) 250 MG tablet; Take 2 tablets on day 1, then 1 tablet daily on days 2 through 5  Dispense: 6 tablet; Refill: 0  2. Mild intermittent reactive airway disease with acute exacerbation Patient with history of intermittent COPD.  Patient has been partially treated with prednisone that she had leftover and we will initiate prednisone 2 a day for a week followed by 1 a day for a week. - predniSONE (DELTASONE) 10 MG tablet; Take 1 tablet (10 mg total) by mouth daily with breakfast. Start 2 a day for one week/then one a day  Dispense: 30 tablet; Refill: 0  3. Acute cough Patient with acute cough we will call and some promethazine dextromethorphan for symptomatic relief particularly at night with encouragement to use Mucinex DM during the day. - promethazine-dextromethorphan (PROMETHAZINE-DM) 6.25-15 MG/5ML syrup; Take 5 mLs by mouth 4 (four) times daily as needed.  Dispense: 118 mL; Refill: 0     Elizabeth Sauer, MD

## 2023-04-06 ENCOUNTER — Encounter: Payer: Self-pay | Admitting: Family Medicine

## 2023-04-28 ENCOUNTER — Other Ambulatory Visit: Payer: Self-pay | Admitting: Family Medicine

## 2023-04-28 DIAGNOSIS — E785 Hyperlipidemia, unspecified: Secondary | ICD-10-CM

## 2023-04-28 NOTE — Telephone Encounter (Signed)
Requested Prescriptions  Pending Prescriptions Disp Refills   gemfibrozil (LOPID) 600 MG tablet [Pharmacy Med Name: GEMFIBROZIL 600 MG Tablet] 180 tablet 1    Sig: TAKE 1 TABLET TWICE DAILY     Cardiovascular:  Antilipid - Fibric Acid Derivatives Failed - 04/28/2023 10:47 AM      Failed - Cr in normal range and within 360 days    Creatinine  Date Value Ref Range Status  02/29/2012 1.16 0.60 - 1.30 mg/dL Final   Creatinine, Ser  Date Value Ref Range Status  09/21/2022 1.33 (H) 0.44 - 1.00 mg/dL Final         Failed - Lipid Panel in normal range within the last 12 months    Cholesterol, Total  Date Value Ref Range Status  05/04/2022 172 100 - 199 mg/dL Final   LDL Chol Calc (NIH)  Date Value Ref Range Status  05/04/2022 75 0 - 99 mg/dL Final   HDL  Date Value Ref Range Status  05/04/2022 64 >39 mg/dL Final   Triglycerides  Date Value Ref Range Status  05/04/2022 203 (H) 0 - 149 mg/dL Final         Passed - ALT in normal range and within 360 days    ALT  Date Value Ref Range Status  09/20/2022 13 0 - 44 U/L Final   SGPT (ALT)  Date Value Ref Range Status  02/29/2012 30 U/L Final    Comment:    12-78 NOTE: NEW REFERENCE RANGE 10/29/2011          Passed - AST in normal range and within 360 days    AST  Date Value Ref Range Status  09/20/2022 18 15 - 41 U/L Final   SGOT(AST)  Date Value Ref Range Status  02/29/2012 27 15 - 37 Unit/L Final         Passed - HGB in normal range and within 360 days    Hemoglobin  Date Value Ref Range Status  09/21/2022 14.1 12.0 - 15.0 g/dL Final  40/98/1191 47.8 11.1 - 15.9 g/dL Final         Passed - HCT in normal range and within 360 days    HCT  Date Value Ref Range Status  09/21/2022 43.5 36.0 - 46.0 % Final   Hematocrit  Date Value Ref Range Status  11/10/2021 37.9 34.0 - 46.6 % Final         Passed - PLT in normal range and within 360 days    Platelets  Date Value Ref Range Status  09/21/2022 260 150 - 400  K/uL Final  11/10/2021 255 150 - 450 x10E3/uL Final         Passed - WBC in normal range and within 360 days    WBC  Date Value Ref Range Status  09/21/2022 9.5 4.0 - 10.5 K/uL Final         Passed - eGFR is 30 or above and within 360 days    EGFR (African American)  Date Value Ref Range Status  02/29/2012 >60 >40mL/min Final   GFR calc Af Amer  Date Value Ref Range Status  10/03/2019 52 (L) >60 mL/min Final    Comment:    Performed at [facility], [address], [place], [place] [address]   EGFR Eddie [place].)  Date Value Ref Range Status  02/29/2012 50 (L) >66mL/min Final    Comment:    eGFR values <39mL/min/1.73 m2 may be an indication of chronic kidney disease (  CKD). Calculated eGFR, using the MRDR Study equation, is useful in  patients with stable renal function. The eGFR calculation will not be reliable in acutely ill patients when serum creatinine is changing rapidly. It is not useful in patients on dialysis. The eGFR calculation may not be applicable to patients at the low and high extremes of body sizes, pregnant women, and vegetarians.    GFR, Estimated  Date Value Ref Range Status  09/21/2022 42 (L) >60 mL/min Final    Comment:    (NOTE) Calculated using the CKD-EPI Creatinine Equation (2021)    eGFR  Date Value Ref Range Status  05/04/2022 50 (L) >59 mL/min/1.73 Final         Passed - Valid encounter within last 12 months    Recent Outpatient Visits           3 weeks ago Acute maxillary sinusitis, recurrence not specified   [place] [facility]   2 months ago Acute cystitis without hematuria   [facility] at [facility], Nyoka Cowden, MD   5 months ago Essential hypertension   [place] [facility], MD   6 months ago Essential hypertension   [place] [facility], MD   6 months ago Chronic rhinitis   [place] [facility] at [facility], Ocie Bob, MD       Future Appointments             In 1 week Duanne Limerick, MD [facility] at [facility], [facility]

## 2023-05-01 ENCOUNTER — Other Ambulatory Visit: Payer: Self-pay | Admitting: Family Medicine

## 2023-05-01 DIAGNOSIS — J4521 Mild intermittent asthma with (acute) exacerbation: Secondary | ICD-10-CM

## 2023-05-10 ENCOUNTER — Ambulatory Visit: Payer: Medicare HMO | Admitting: Family Medicine

## 2023-05-12 ENCOUNTER — Other Ambulatory Visit: Payer: Self-pay | Admitting: Family Medicine

## 2023-05-12 DIAGNOSIS — J301 Allergic rhinitis due to pollen: Secondary | ICD-10-CM

## 2023-05-12 NOTE — Telephone Encounter (Signed)
Appt 05/19/23 Rx:  Amlodipine - 02/28/23 #90 too soon Requested Prescriptions  Pending Prescriptions Disp Refills   montelukast (SINGULAIR) 10 MG tablet [Pharmacy Med Name: MONTELUKAST SODIUM 10 MG Tablet] 90 tablet 3    Sig: TAKE 1 TABLET EVERY DAY     Pulmonology:  Leukotriene Inhibitors Passed - 05/12/2023  2:25 AM      Passed - Valid encounter within last 12 months    Recent Outpatient Visits           1 month ago Acute maxillary sinusitis, recurrence not specified   [place] [facility], MD   2 months ago Acute cystitis without hematuria   [place] [facility] at [facility], Nyoka Cowden, MD   6 months ago Essential hypertension   [place] [facility], MD   6 months ago Essential hypertension   [place] [facility], MD   7 months ago Chronic rhinitis   [place] [facility], Ocie Bob, MD       Future Appointments             In 1 week Duanne Limerick, MD [facility] at [facility], [facility]             amLODipine (NORVASC) 5 MG tablet [Pharmacy Med Name: AMLODIPINE BESYLATE 5 MG Tablet] 90 tablet 3    Sig: TAKE 1 TABLET EVERY DAY     Cardiovascular: Calcium Channel Blockers 2 Passed - 05/12/2023  2:25 AM      Passed - Last BP in normal range    BP Readings from Last 1 Encounters:  04/05/23 128/78         Passed - Last Heart Rate in normal range    Pulse Readings from Last 1 Encounters:  04/05/23 64         Passed - Valid encounter within last 6 months    Recent Outpatient Visits           1 month ago Acute maxillary sinusitis, recurrence not specified   [place] [facility], [facility]   2 months ago Acute cystitis without  hematuria   [facility] at [facility], Nyoka Cowden, MD   6 months ago Essential hypertension   [place] [facility], MD   6 months ago Essential hypertension   [place] [facility], MD   7 months ago Chronic rhinitis   [place] [facility] at [facility], Ocie Bob, MD       Future Appointments             In 1 week Duanne Limerick, MD [facility] at [facility], [facility]

## 2023-05-19 ENCOUNTER — Ambulatory Visit (INDEPENDENT_AMBULATORY_CARE_PROVIDER_SITE_OTHER): Payer: Medicare HMO | Admitting: Family Medicine

## 2023-05-19 ENCOUNTER — Encounter: Payer: Self-pay | Admitting: Family Medicine

## 2023-05-19 VITALS — BP 128/78 | HR 64 | Ht 61.0 in | Wt 134.0 lb

## 2023-05-19 DIAGNOSIS — I10 Essential (primary) hypertension: Secondary | ICD-10-CM

## 2023-05-19 DIAGNOSIS — R1011 Right upper quadrant pain: Secondary | ICD-10-CM | POA: Diagnosis not present

## 2023-05-19 DIAGNOSIS — K580 Irritable bowel syndrome with diarrhea: Secondary | ICD-10-CM

## 2023-05-19 DIAGNOSIS — R1319 Other dysphagia: Secondary | ICD-10-CM | POA: Diagnosis not present

## 2023-05-19 DIAGNOSIS — J301 Allergic rhinitis due to pollen: Secondary | ICD-10-CM

## 2023-05-19 DIAGNOSIS — E785 Hyperlipidemia, unspecified: Secondary | ICD-10-CM

## 2023-05-19 MED ORDER — AMLODIPINE BESYLATE 5 MG PO TABS: 5.0000 mg | ORAL_TABLET | Freq: Every day | ORAL | 1 refills | Status: DC

## 2023-05-19 MED ORDER — HYDROCHLOROTHIAZIDE 25 MG PO TABS: 25.0000 mg | ORAL_TABLET | Freq: Every day | ORAL | 1 refills | Status: DC

## 2023-05-19 MED ORDER — METOPROLOL TARTRATE 50 MG PO TABS: 50.0000 mg | ORAL_TABLET | Freq: Two times a day (BID) | ORAL | 1 refills | Status: DC

## 2023-05-19 MED ORDER — ATORVASTATIN CALCIUM 40 MG PO TABS: 40.0000 mg | ORAL_TABLET | Freq: Every day | ORAL | 1 refills | Status: DC

## 2023-05-19 MED ORDER — PANTOPRAZOLE SODIUM 40 MG PO TBEC: 40.0000 mg | DELAYED_RELEASE_TABLET | Freq: Every day | ORAL | 1 refills | Status: DC

## 2023-05-19 MED ORDER — MONTELUKAST SODIUM 10 MG PO TABS
10.0000 mg | ORAL_TABLET | Freq: Every day | ORAL | 1 refills | Status: AC
Start: 2023-05-19 — End: ?

## 2023-05-19 MED ORDER — GEMFIBROZIL 600 MG PO TABS: 600.0000 mg | ORAL_TABLET | Freq: Two times a day (BID) | ORAL | 1 refills | Status: DC

## 2023-05-19 NOTE — Progress Notes (Signed)
Date:  05/19/2023   Name:  Judy Bolton   DOB:  1945-12-24   MRN:  [id number]   Chief Complaint: Hypertension, Hyperlipidemia, Allergic Rhinitis , Gastroesophageal Reflux, and Diarrhea  Hypertension This is a chronic problem. The current episode started more than 1 year ago. The problem has been gradually improving since onset. The problem is controlled. Pertinent negatives include no anxiety, blurred vision, chest pain, headaches, malaise/fatigue, neck pain, orthopnea, palpitations, peripheral edema, PND, shortness of breath or sweats. There are no associated agents to hypertension. Risk factors for coronary artery disease include dyslipidemia. Past treatments include calcium channel blockers, diuretics and beta blockers. The current treatment provides moderate improvement. There are no compliance problems.  There is no history of kidney disease, CAD/MI or CVA. There is no history of chronic renal disease, a hypertension causing med or renovascular disease.  Hyperlipidemia This is a chronic problem. The current episode started more than 1 year ago. The problem is controlled. Recent lipid tests were reviewed and are normal. She has no history of chronic renal disease. There are no known factors aggravating her hyperlipidemia. Pertinent negatives include no chest pain, focal sensory loss, focal weakness, myalgias or shortness of breath. Current antihyperlipidemic treatment includes statins. The current treatment provides moderate improvement of lipids. There are no compliance problems.  There are no known risk factors for coronary artery disease.  Gastroesophageal Reflux She complains of abdominal pain. She reports no chest pain, no coughing, no dysphagia, no heartburn, no nausea or no wheezing. This is a new problem. The current episode started in the past 7 days. The problem has been gradually improving. The symptoms are aggravated by certain foods. She has tried a PPI for the symptoms. The  treatment provided moderate relief.  Diarrhea  This is a chronic problem. The current episode started more than 1 year ago. The problem has been gradually improving. Associated symptoms include abdominal pain. Pertinent negatives include no coughing, headaches, myalgias or sweats. The treatment provided moderate relief.    Lab Results  Component Value Date   NA 140 09/21/2022   K 4.3 09/21/2022   CO2 29 09/21/2022   GLUCOSE 132 (H) 09/21/2022   BUN 37 (H) 09/21/2022   CREATININE 1.33 (H) 09/21/2022   CALCIUM 9.9 09/21/2022   EGFR 50 (L) 05/04/2022   GFRNONAA 42 (L) 09/21/2022   Lab Results  Component Value Date   CHOL 172 05/04/2022   HDL 64 05/04/2022   LDLCALC 75 05/04/2022   TRIG 203 (H) 05/04/2022   CHOLHDL 2.8 01/24/2018   No results found for: "TSH" Lab Results  Component Value Date   HGBA1C 6.4 04/20/2022   Lab Results  Component Value Date   WBC 9.5 09/21/2022   HGB 14.1 09/21/2022   HCT 43.5 09/21/2022   MCV 86.1 09/21/2022   PLT 260 09/21/2022   Lab Results  Component Value Date   ALT 13 09/20/2022   AST 18 09/20/2022   ALKPHOS 115 09/20/2022   BILITOT 0.7 09/20/2022   No results found for: "25OHVITD2", "25OHVITD3", "VD25OH"   Review of Systems  Constitutional:  Negative for diaphoresis, malaise/fatigue and unexpected weight change.  Eyes:  Negative for blurred vision.  Respiratory:  Negative for cough, shortness of breath and wheezing.   Cardiovascular:  Negative for chest pain, palpitations, orthopnea and PND.  Gastrointestinal:  Positive for abdominal pain and diarrhea. Negative for dysphagia, heartburn and nausea.  Musculoskeletal:  Negative for myalgias and neck pain.  Neurological:  Negative for  focal weakness and headaches.    Patient Active Problem List   Diagnosis Date Noted   Chronic rhinitis 10/08/2022   Primary osteoarthritis of right hip 08/21/2021   Essential hypertension 08/30/2017   Gastroesophageal reflux disease 08/30/2017    Aortic atherosclerosis (HCC) 05/31/2017   PAD (peripheral artery disease) (HCC) 05/31/2017   Encounter for long-term (current) use of high-risk medication 12/28/2016   Seronegative rheumatoid arthritis (HCC) 12/28/2016   Bilateral hand pain 11/08/2016   Elevated C-reactive protein 11/08/2016   Swelling of joint of right hand 11/08/2016   Type 2 diabetes mellitus with microalbuminuria, without long-term current use of insulin (HCC) 07/06/2016   Acute anxiety 04/16/2016   Bronchitis 04/16/2016   Centrilobular emphysema (HCC) 04/16/2016   TIA (transient ischemic attack) 02/29/2016   Lumbar stenosis with neurogenic claudication 04/16/2015   Spondylosis of lumbar region without myelopathy or radiculopathy 04/16/2015   Hyperlipidemia 10/15/2014    Allergies  Allergen Reactions   Rosuvastatin Swelling    Neck swelling   Sulfa Antibiotics Itching   Betadine [Povidone Iodine] Swelling   Codeine Itching   Cyclobenzaprine     Other reaction(s): Dizziness   Latex Itching and Swelling    (urinary catheter)   Losartan     intolerance   Meloxicam Swelling   Penicillins Other (See Comments)    Paralysis  Has patient had a PCN reaction causing immediate rash, facial/tongue/throat swelling, SOB or lightheadedness with hypotension: No Has patient had a PCN reaction causing severe rash involving mucus membranes or skin necrosis: No Has patient had a PCN reaction that required hospitalization Yes Has patient had a PCN reaction occurring within the last 10 years: No If all of the above answers are "NO", then may proceed with Cephalosporin use.   Valsartan     Past Surgical History:  Procedure Laterality Date   ABDOMINAL HYSTERECTOMY     ANTERIOR (CYSTOCELE) AND POSTERIOR REPAIR (RECTOCELE) WITH XENFORM GRAFT AND SACROSPINOUS FIXATION     APPENDECTOMY     BROW LIFT Bilateral 09/05/2018   Procedure: BLEPHAROPLASTY UPPER EYELID W/EXCESS SKIN;  Surgeon: Imagene Riches, MD;  Location: [facility];  Service: Ophthalmology;  Laterality: Bilateral;   CATARACT EXTRACTION W/PHACO Left 01/06/2022   Procedure: CATARACT EXTRACTION PHACO AND INTRAOCULAR LENS PLACEMENT (IOC) LEFT DIABETIC 8.09 01:07.3;  Surgeon: Lockie Mola, MD;  Location: [facility];  Service: Ophthalmology;  Laterality: Left;   CATARACT EXTRACTION W/PHACO Right 01/20/2022   Procedure: CATARACT EXTRACTION PHACO AND INTRAOCULAR LENS PLACEMENT (IOC) RIGHT DIABETIC;  Surgeon: Lockie Mola, MD;  Location: [facility];  Service: Ophthalmology;  Laterality: Right;  6.03 01:02.2   CTR Bilateral    LUMBAR LAMINECTOMY/DECOMPRESSION MICRODISCECTOMY N/A 07/27/2017   Procedure: LUMBAR LAMINECTOMY/DECOMPRESSION MICRODISCECTOMY 3 LEVELS-L3-S1;  Surgeon: Venetia Night, MD;  Location: [facility];  Service: Neurosurgery;  Laterality: N/A;   PTOSIS REPAIR Bilateral 09/05/2018   Procedure: PTOSIS REPAIR RESECT EX;  Surgeon: Imagene Riches, MD;  Location: [facility];  Service: Ophthalmology;  Laterality: Bilateral;  Diabetic - oral meds sleep apnea Latexd allergy   ROTATOR CUFF REPAIR Right     Social History   Tobacco Use   Smoking status: Former    Packs/day: 1.50    Years: 28.00    Additional pack years: 0.00    Total pack years: 42.00    Types: Cigarettes    Quit date: 08/14/2000    Years since quitting: 22.7   Smokeless tobacco: Never   Tobacco comments:    smoking  cessatiuon materials not required  Vaping Use   Vaping Use: Never used  Substance Use Topics   Alcohol use: No   Drug use: No     Medication list has been reviewed and updated.  Current Meds  Medication Sig   albuterol (PROVENTIL HFA;VENTOLIN HFA) 108 (90 Base) MCG/ACT inhaler Inhale 2 puffs into the lungs every 6 (six) hours as needed for wheezing or shortness of breath.   albuterol (PROVENTIL) (2.5 MG/3ML) 0.083% nebulizer solution Take 3 mLs (2.5 mg total) by nebulization every 6 (six) hours as needed for  wheezing or shortness of breath.   ALPRAZolam (XANAX) 0.25 MG tablet Take 1 tablet (0.25 mg total) by mouth 2 (two) times daily as needed for anxiety.   amLODipine (NORVASC) 5 MG tablet TAKE 1 TABLET EVERY DAY   atorvastatin (LIPITOR) 40 MG tablet TAKE 1 TABLET EVERY DAY   celecoxib (CELEBREX) 200 MG capsule Take 1 capsule by mouth daily.   cholecalciferol (VITAMIN D3) 25 MCG (1000 UNIT) tablet Take 1,000 Units by mouth daily.   FASENRA 30 MG/ML SOSY Once every 2 months for asthma. Paid for by [employer]   flunisolide (NASALIDE) 25 MCG/ACT (0.025%) SOLN Place 2 sprays into the nose 2 (two) times daily.   gabapentin (NEURONTIN) 300 MG capsule Take 1 capsule by mouth daily.   gemfibrozil (LOPID) 600 MG tablet TAKE 1 TABLET TWICE DAILY   hydrochlorothiazide (HYDRODIURIL) 25 MG tablet TAKE 1 TABLET EVERY DAY   ipratropium (ATROVENT) 0.02 % nebulizer solution Inhale into the lungs.   metFORMIN (GLUCOPHAGE) 500 MG tablet Take 500 mg by mouth 2 (two) times daily.   metoprolol tartrate (LOPRESSOR) 50 MG tablet Take 1 tablet (50 mg total) by mouth 2 (two) times daily.   montelukast (SINGULAIR) 10 MG tablet TAKE 1 TABLET EVERY DAY   pantoprazole (PROTONIX) 40 MG tablet Take 1 tablet (40 mg total) by mouth daily.   vitamin B-12 (CYANOCOBALAMIN) 1000 MCG tablet Take 1,000 mcg by mouth daily.       05/19/2023    2:27 PM 02/25/2023   10:24 AM 11/08/2022    1:45 PM 10/21/2022    2:06 PM  GAD 7 : Generalized Anxiety Score  Nervous, Anxious, on Edge 0 1 0 0  Control/stop worrying 0 1 0 0  Worry too much - different things 0 1 0 0  Trouble relaxing 0 0 0 0  Restless 0 0 0 0  Easily annoyed or irritable 0 0 0 0  Afraid - awful might happen 0 0 0 0  Total GAD 7 Score 0 3 0 0  Anxiety Difficulty Not difficult at all Not difficult at all Not difficult at all Not difficult at all       05/19/2023    2:26 PM 02/25/2023   10:24 AM 11/08/2022    1:45 PM  Depression screen PHQ 2/9  Decreased Interest 0 0  0  Down, Depressed, Hopeless 0 0 0  PHQ - 2 Score 0 0 0  Altered sleeping 0 0 0  Tired, decreased energy 0 0 0  Change in appetite 0 0 0  Feeling bad or failure about yourself  0 0 0  Trouble concentrating 0 0 0  Moving slowly or fidgety/restless 0 0 0  Suicidal thoughts 0 0 0  PHQ-9 Score 0 0 0  Difficult doing work/chores Not difficult at all Not difficult at all Not difficult at all    BP Readings from Last 3 Encounters:  05/19/23 128/78  04/05/23  128/78  02/25/23 134/80    Physical Exam Vitals and nursing note reviewed. Exam conducted with a chaperone present.  Constitutional:      General: She is not in acute distress.    Appearance: She is not diaphoretic.  HENT:     Head: Normocephalic and atraumatic.     Right Ear: Tympanic membrane and external ear normal.     Left Ear: Tympanic membrane and external ear normal.     Nose: No congestion or rhinorrhea.     Mouth/Throat:     Mouth: Mucous membranes are moist.     Pharynx: No oropharyngeal exudate or posterior oropharyngeal erythema.  Eyes:     General:        Right eye: No discharge.        Left eye: No discharge.     Conjunctiva/sclera: Conjunctivae normal.     Pupils: Pupils are equal, round, and reactive to light.  Neck:     Thyroid: No thyromegaly.     Vascular: No JVD.  Cardiovascular:     Rate and Rhythm: Normal rate and regular rhythm.     Heart sounds: Normal heart sounds. No murmur heard.    No friction rub. No gallop.  Pulmonary:     Effort: Pulmonary effort is normal.     Breath sounds: Normal breath sounds. No wheezing, rhonchi or rales.  Abdominal:     General: Bowel sounds are normal.     Palpations: Abdomen is soft. There is no mass.     Tenderness: There is no abdominal tenderness. There is no guarding.  Musculoskeletal:        General: Normal range of motion.     Cervical back: Normal range of motion and neck supple.  Lymphadenopathy:     Cervical: No cervical adenopathy.  Skin:     General: Skin is warm and dry.  Neurological:     Mental Status: She is alert.     Wt Readings from Last 3 Encounters:  05/19/23 134 lb (60.8 kg)  04/05/23 135 lb (61.2 kg)  02/25/23 134 lb (60.8 kg)    BP 128/78   Pulse 64   Ht 5\' 1"  (1.549 m)   Wt 134 lb (60.8 kg)   SpO2 97%   BMI 25.32 kg/m   Assessment and Plan:  1. Essential hypertension Chronic.  Controlled.  Stable.  Blood pressure today 128/78.  Continue hydrochlorothiazide 25 mg once a day and metoprolol 50 mg 1 twice a day.  Asymptomatic.  Tolerating these medications well and will recheck in 6 months.  Will check renal function panel for electrolytes and GFR today. - hydrochlorothiazide (HYDRODIURIL) 25 MG tablet; Take 1 tablet (25 mg total) by mouth daily.  Dispense: 90 tablet; Refill: 1 - metoprolol tartrate (LOPRESSOR) 50 MG tablet; Take 1 tablet (50 mg total) by mouth 2 (two) times daily.  Dispense: 180 tablet; Refill: 1 - Renal Function Panel  2. Hyperlipidemia, unspecified hyperlipidemia type .  Controlled.  Stable.  Continue atorvastatin 40 mg once a day and gemfibrozil 600 mg twice a day. - atorvastatin (LIPITOR) 40 MG tablet; Take 1 tablet (40 mg total) by mouth daily.  Dispense: 90 tablet; Refill: 1 - gemfibrozil (LOPID) 600 MG tablet; Take 1 tablet (600 mg total) by mouth 2 (two) times daily.  Dispense: 180 tablet; Refill: 1  3. Seasonal allergic rhinitis due to pollen .  Controlled.  Stable.  Continue Singulair 10 mg once a day. - montelukast (SINGULAIR) 10 MG tablet; Take  1 tablet (10 mg total) by mouth daily.  Dispense: 90 tablet; Refill: 1  4. Right upper quadrant pain Patient is having some resumption of some discomfort in the right upper quadrant.  Will continue pantoprazole 40 mg once a day.  But will likely will refer to gastroenterology which likely will be Christiana Pellant with [facility]. - pantoprazole (PROTONIX) 40 MG tablet; Take 1 tablet (40 mg total) by mouth daily.  Dispense: 90 tablet;  Refill: 1  5. Irritable bowel syndrome with diarrhea Previous history of irritable bowel with diarrhea several years ago which may be flaring up although there was some discussion of Crohn's disease as being a possibility in the past as well.  In any event she may be in position for needing to have another colonoscopy. - Ambulatory referral to Gastroenterology  6. Esophageal dysphagia In any event patient does have some dysphagia developing for which she has had dilation of the esophagus in the past.  And this will be brought up with gastroenterology for evaluation as well. - Ambulatory referral to Gastroenterology    Elizabeth Sauer, MD

## 2023-05-20 LAB — RENAL FUNCTION PANEL
Albumin: 4.2 g/dL (ref 3.8–4.8)
BUN/Creatinine Ratio: 21 (ref 12–28)
BUN: 30 mg/dL — ABNORMAL HIGH (ref 8–27)
CO2: 21 mmol/L (ref 20–29)
Calcium: 8.8 mg/dL (ref 8.7–10.3)
Chloride: 104 mmol/L (ref 96–106)
Creatinine, Ser: 1.43 mg/dL — ABNORMAL HIGH (ref 0.57–1.00)
Glucose: 166 mg/dL — ABNORMAL HIGH (ref 70–99)
Phosphorus: 2.5 mg/dL — ABNORMAL LOW (ref 3.0–4.3)
Potassium: 4.4 mmol/L (ref 3.5–5.2)
Sodium: 140 mmol/L (ref 134–144)
eGFR: 38 mL/min/{1.73_m2} — ABNORMAL LOW (ref >59)

## 2023-05-24 DIAGNOSIS — E1129 Type 2 diabetes mellitus with other diabetic kidney complication: Secondary | ICD-10-CM | POA: Diagnosis not present

## 2023-05-24 DIAGNOSIS — M5030 Other cervical disc degeneration, unspecified cervical region: Secondary | ICD-10-CM | POA: Diagnosis not present

## 2023-05-24 DIAGNOSIS — S161XXA Strain of muscle, fascia and tendon at neck level, initial encounter: Secondary | ICD-10-CM | POA: Diagnosis not present

## 2023-05-24 DIAGNOSIS — R809 Proteinuria, unspecified: Secondary | ICD-10-CM | POA: Diagnosis not present

## 2023-05-24 DIAGNOSIS — I700 Atherosclerosis of aorta: Secondary | ICD-10-CM | POA: Diagnosis not present

## 2023-05-24 DIAGNOSIS — M5136 Other intervertebral disc degeneration, lumbar region: Secondary | ICD-10-CM | POA: Diagnosis not present

## 2023-05-24 DIAGNOSIS — S39012A Strain of muscle, fascia and tendon of lower back, initial encounter: Secondary | ICD-10-CM | POA: Diagnosis not present

## 2023-06-08 DIAGNOSIS — I10 Essential (primary) hypertension: Secondary | ICD-10-CM | POA: Diagnosis not present

## 2023-06-08 DIAGNOSIS — R809 Proteinuria, unspecified: Secondary | ICD-10-CM | POA: Diagnosis not present

## 2023-06-08 DIAGNOSIS — Z794 Long term (current) use of insulin: Secondary | ICD-10-CM | POA: Diagnosis not present

## 2023-06-08 DIAGNOSIS — E1129 Type 2 diabetes mellitus with other diabetic kidney complication: Secondary | ICD-10-CM | POA: Diagnosis not present

## 2023-06-08 DIAGNOSIS — R011 Cardiac murmur, unspecified: Secondary | ICD-10-CM | POA: Diagnosis not present

## 2023-06-08 DIAGNOSIS — E119 Type 2 diabetes mellitus without complications: Secondary | ICD-10-CM | POA: Diagnosis not present

## 2023-06-08 DIAGNOSIS — E781 Pure hyperglyceridemia: Secondary | ICD-10-CM | POA: Diagnosis not present

## 2023-06-08 DIAGNOSIS — R002 Palpitations: Secondary | ICD-10-CM | POA: Diagnosis not present

## 2023-06-08 DIAGNOSIS — M0600 Rheumatoid arthritis without rheumatoid factor, unspecified site: Secondary | ICD-10-CM | POA: Diagnosis not present

## 2023-06-10 ENCOUNTER — Other Ambulatory Visit: Payer: Self-pay | Admitting: Internal Medicine

## 2023-06-10 DIAGNOSIS — E781 Pure hyperglyceridemia: Secondary | ICD-10-CM

## 2023-06-10 DIAGNOSIS — I10 Essential (primary) hypertension: Secondary | ICD-10-CM

## 2023-06-16 ENCOUNTER — Ambulatory Visit: Payer: Self-pay

## 2023-06-16 NOTE — Telephone Encounter (Signed)
  Chief Complaint: Yellow jacket sting Symptoms: Pain mild swelling Frequency: this morning Pertinent Negatives: Patient denies Fever, facial swelling, difficulty breathing Disposition: [] ED /[] Urgent Care (no appt availability in office) / [] Appointment(In office/virtual)/ []  [facility]/ [x] Home Care/ [] Refused Recommended Disposition /[] [facility]/ []  Follow-up with PCP Additional Notes: Pt was mowning her grass this morning and ran over a yellow jacket nest. Pt was stung on her foot. Pt has severe reactions in the past, but not this time. Pt will follow home care advice and monitor s/s for more concerning s/s. Pt will call EMS if needed.    Summary: Pt was stung by yellow jacket and requests advice   Pt stated she was stung by a yellow jacket and would like to know what she can do about the stinging, throbbing and swelling. Pt requests call back to discuss resolution. Cb# [phone]     Reason for Disposition  Normal local reaction to bee, wasp, or yellow jacket sting  Answer Assessment - Initial Assessment Questions 1. TYPE: "What type of sting was it?" (bee, yellow jacket, etc.)      Yellow jacket 2. ONSET: "When did it occur?"      This morning 3. LOCATION: "Where is the sting located?"  "How many stings?"     On the instep  4. SWELLING SIZE: "How big is the swelling?" (e.g., inches or cm)     Some swelling 5. REDNESS: "Is the area red or pink?" If Yes, ask: "What size is area of redness?" (e.g., inches or cm). "When did the redness start?"     yes 6. PAIN: "Is there any pain?" If Yes, ask: "How bad is it?"  (Scale 1-10; or mild, moderate, severe)     throbbing 7. ITCHING: "Is there any itching?" If Yes, ask: "How bad is it?"      Not yet. 8. RESPIRATORY DISTRESS: "Describe your breathing."     no 9. PRIOR REACTIONS: "Have you had any severe allergic reactions to stings in the past?" if yes, ask: "What happened?"     yes 10. OTHER SYMPTOMS: "Do you  have any other symptoms?" (e.g., abdomen pain, face or tongue swelling, new rash elsewhere, vomiting)       no  Protocols used: Bee or Yellow Jacket Sting-A-AH

## 2023-06-17 ENCOUNTER — Ambulatory Visit
Admission: RE | Admit: 2023-06-17 | Discharge: 2023-06-17 | Disposition: A | Discharge status: Home / Self Care | Payer: Self-pay | Source: Ambulatory Visit | Attending: Internal Medicine | Admitting: Internal Medicine

## 2023-06-17 DIAGNOSIS — I10 Essential (primary) hypertension: Secondary | ICD-10-CM

## 2023-06-17 DIAGNOSIS — E781 Pure hyperglyceridemia: Secondary | ICD-10-CM

## 2023-06-20 DIAGNOSIS — R002 Palpitations: Secondary | ICD-10-CM | POA: Diagnosis not present

## 2023-06-22 ENCOUNTER — Ambulatory Visit: Payer: Medicare HMO

## 2023-06-22 VITALS — Ht 62.0 in | Wt 128.0 lb

## 2023-06-22 DIAGNOSIS — Z0000 Encounter for general adult medical examination without abnormal findings: Secondary | ICD-10-CM | POA: Diagnosis not present

## 2023-06-22 NOTE — Progress Notes (Signed)
Subjective:   Judy Bolton is a 77 y.o. female who presents for Medicare Annual (Subsequent) preventive examination.  Visit Complete: Virtual  I connected with  Judy Bolton on 06/22/23 by a audio enabled telemedicine application and verified that I am speaking with the correct person using two identifiers.  Patient Location: Home  Provider Location: Office/Clinic  I discussed the limitations of evaluation and management by telemedicine. The patient expressed understanding and agreed to proceed. Cardiac Risk Factors include: advanced age (>60men, >31 women);diabetes mellitus;dyslipidemia;hypertension     Objective:    Today's Vitals   06/22/23 0935 06/22/23 0938  Weight: 128 lb (58.1 kg)   Height: 5\' 2"  (1.575 m)   PainSc:  3    Body mass index is 23.41 kg/m.     06/22/2023    9:47 AM 09/21/2022    1:12 PM 09/20/2022    5:49 PM 03/29/2022    1:56 PM 01/20/2022    6:36 AM 01/06/2022    8:49 AM 03/09/2021    1:48 PM  Advanced Directives  Does Patient Have a Medical Advance Directive? Yes No No Yes Yes Yes No  Type of Estate agent of [place];Living will   Healthcare Power of [place];Living will Healthcare Power of [place];Living will Healthcare Power of [place];Living will   Does patient want to make changes to medical advance directive? No - Patient declined    No - Patient declined No - Patient declined   Copy of Healthcare Power of Attorney in Chart? No - copy requested   No - copy requested Yes - validated most recent copy scanned in chart (See row information) No - copy requested   Would patient like information on creating a medical advance directive?  No - Patient declined     Yes (MAU/Ambulatory/Procedural Areas - Information given)    Current Medications (verified) Outpatient Encounter Medications as of 06/22/2023  Medication Sig   albuterol (PROVENTIL HFA;VENTOLIN HFA) 108 (90 Base) MCG/ACT inhaler Inhale 2 puffs into the lungs every 6  (six) hours as needed for wheezing or shortness of breath.   albuterol (PROVENTIL) (2.5 MG/3ML) 0.083% nebulizer solution Take 3 mLs (2.5 mg total) by nebulization every 6 (six) hours as needed for wheezing or shortness of breath.   ALPRAZolam (XANAX) 0.25 MG tablet Take 1 tablet (0.25 mg total) by mouth 2 (two) times daily as needed for anxiety.   amLODipine (NORVASC) 5 MG tablet Take 1 tablet (5 mg total) by mouth daily.   atorvastatin (LIPITOR) 40 MG tablet Take 1 tablet (40 mg total) by mouth daily.   celecoxib (CELEBREX) 200 MG capsule Take 1 capsule by mouth daily.   cholecalciferol (VITAMIN D3) 25 MCG (1000 UNIT) tablet Take 1,000 Units by mouth daily.   FASENRA 30 MG/ML SOSY Once every 2 months for asthma. Paid for by [employer]   gabapentin (NEURONTIN) 300 MG capsule Take 1 capsule by mouth daily.   gemfibrozil (LOPID) 600 MG tablet Take 1 tablet (600 mg total) by mouth 2 (two) times daily.   hydrochlorothiazide (HYDRODIURIL) 25 MG tablet Take 1 tablet (25 mg total) by mouth daily.   ipratropium (ATROVENT) 0.02 % nebulizer solution Inhale into the lungs.   metFORMIN (GLUCOPHAGE) 500 MG tablet Take 500 mg by mouth 2 (two) times daily.   metoprolol tartrate (LOPRESSOR) 50 MG tablet Take 1 tablet (50 mg total) by mouth 2 (two) times daily.   montelukast (SINGULAIR) 10 MG tablet Take 1 tablet (10 mg total) by mouth daily.  pantoprazole (PROTONIX) 40 MG tablet Take 1 tablet (40 mg total) by mouth daily.   vitamin B-12 (CYANOCOBALAMIN) 1000 MCG tablet Take 1,000 mcg by mouth daily.   [DISCONTINUED] flunisolide (NASALIDE) 25 MCG/ACT (0.025%) SOLN Place 2 sprays into the nose 2 (two) times daily.   [DISCONTINUED] nystatin cream (MYCOSTATIN) Apply 1 Application topically 2 (two) times daily. (Patient not taking: Reported on 05/19/2023)   No facility-administered encounter medications on file as of 06/22/2023.    Allergies (verified) Rosuvastatin, Sulfa antibiotics, Betadine [povidone iodine],  Codeine, Cyclobenzaprine, Latex, Losartan, Meloxicam, Penicillins, and Valsartan   History: Past Medical History:  Diagnosis Date   Acid reflux    Arthritis    Asthma    Back pain    Back pain    COPD (chronic obstructive pulmonary disease) (HCC)    Diabetes mellitus without complication (HCC)    Hyperlipidemia    Hypertension    Seizure (HCC)    x1 - over 30 yrs ago   Sleep apnea    No CPAP   Stroke [facility])    March of 2017 vocabulary   Past Surgical History:  Procedure Laterality Date   ABDOMINAL HYSTERECTOMY     ANTERIOR (CYSTOCELE) AND POSTERIOR REPAIR (RECTOCELE) WITH XENFORM GRAFT AND SACROSPINOUS FIXATION     APPENDECTOMY     BROW LIFT Bilateral 09/05/2018   Procedure: BLEPHAROPLASTY UPPER EYELID W/EXCESS SKIN;  Surgeon: Imagene Riches, MD;  Location: [facility];  Service: Ophthalmology;  Laterality: Bilateral;   CATARACT EXTRACTION W/PHACO Left 01/06/2022   Procedure: CATARACT EXTRACTION PHACO AND INTRAOCULAR LENS PLACEMENT (IOC) LEFT DIABETIC 8.09 01:07.3;  Surgeon: Lockie Mola, MD;  Location: [facility];  Service: Ophthalmology;  Laterality: Left;   CATARACT EXTRACTION W/PHACO Right 01/20/2022   Procedure: CATARACT EXTRACTION PHACO AND INTRAOCULAR LENS PLACEMENT (IOC) RIGHT DIABETIC;  Surgeon: Lockie Mola, MD;  Location: [facility];  Service: Ophthalmology;  Laterality: Right;  6.03 01:02.2   CTR Bilateral    LUMBAR LAMINECTOMY/DECOMPRESSION MICRODISCECTOMY N/A 07/27/2017   Procedure: LUMBAR LAMINECTOMY/DECOMPRESSION MICRODISCECTOMY 3 LEVELS-L3-S1;  Surgeon: Venetia Night, MD;  Location: [facility] ORS;  Service: Neurosurgery;  Laterality: N/A;   PTOSIS REPAIR Bilateral 09/05/2018   Procedure: PTOSIS REPAIR RESECT EX;  Surgeon: Imagene Riches, MD;  Location: [facility];  Service: Ophthalmology;  Laterality: Bilateral;  Diabetic - oral meds sleep apnea Latexd allergy   ROTATOR CUFF REPAIR Right    Family History  Problem  Relation Age of Onset   Heart attack Mother    Prostate cancer Father    Brain cancer Father    Heart attack Maternal Grandmother    Heart attack Maternal Grandfather    Breast cancer Paternal Grandmother    Social History   Socioeconomic History   Marital status: Widowed    Spouse name: Not on file   Number of children: 1   Years of education: some college   Highest education level: 12th grade  Occupational History   Occupation: Retired  Tobacco Use   Smoking status: Former    Current packs/day: 0.00    Average packs/day: 1.5 packs/day for 28.0 years (42.0 ttl pk-yrs)    Types: Cigarettes    Start date: 08/14/1972    Quit date: 08/14/2000    Years since quitting: 22.8   Smokeless tobacco: Never   Tobacco comments:    smoking cessatiuon materials not required  Vaping Use   Vaping status: Never Used  Substance and Sexual Activity   Alcohol use: No  Drug use: No   Sexual activity: Not Currently  Other Topics Concern   Not on file  Social History Narrative   Pt lives alone   Social Determinants of Health   Financial Resource Strain: Low Risk  (06/22/2023)   Overall Financial Resource Strain (CARDIA)    Difficulty of Paying Living Expenses: Not hard at all  Food Insecurity: No Food Insecurity (06/22/2023)   Hunger Vital Sign    Worried About Running Out of Food in the Last Year: Never true    Ran Out of Food in the Last Year: Never true  Transportation Needs: No Transportation Needs (06/22/2023)   PRAPARE - Administrator, Civil Service (Medical): No    Lack of Transportation (Non-Medical): No  Physical Activity: Insufficiently Active (06/22/2023)   Exercise Vital Sign    Days of Exercise per Week: 5 days    Minutes of Exercise per Session: 10 min  Stress: No Stress Concern Present (06/22/2023)   [employer] of Occupational Health - Occupational Stress Questionnaire    Feeling of Stress : Not at all  Social Connections: Moderately Integrated  (06/22/2023)   Social Connection and Isolation Panel [NHANES]    Frequency of Communication with Friends and Family: More than three times a week    Frequency of Social Gatherings with Friends and Family: More than three times a week    Attends Religious Services: More than 4 times per year    Active Member of [employer] or Organizations: Yes    Attends Banker Meetings: More than 4 times per year    Marital Status: Widowed    Tobacco Counseling Counseling given: Yes Tobacco comments: smoking cessatiuon materials not required   Clinical Intake:  Pre-visit preparation completed: No  Pain : 0-10 Pain Score: 3  Pain Type: Chronic pain Pain Location: Back Pain Orientation: Lower Pain Descriptors / Indicators: Dull, Sharp (s/p back surg x "few years ago") Pain Onset: Other (comment) ("arthritis pain" for few years) Pain Frequency: Intermittent     BMI - recorded: 23.41 Nutritional Status: BMI of 19-24  Normal Nutritional Risks: None Diabetes: Yes CBG done?: Yes CBG resulted in Enter/ Edit results?: No (128 at home pt reports)  How often do you need to have someone help you when you read instructions, pamphlets, or other written materials from your doctor or pharmacy?: 1 - Never  Interpreter Needed?: No  Information entered by :: Arthur Holms   Activities of Daily Living    06/22/2023    9:41 AM 06/18/2023    8:43 AM  In your present state of health, do you have any difficulty performing the following activities:  Hearing? 0 0  Vision? 0 0  Difficulty concentrating or making decisions? 0 0  Walking or climbing stairs? 0 1  Dressing or bathing? 0 0  Doing errands, shopping? 0 0  Preparing Food and eating ? N N  Using the Toilet? N N  In the past six months, have you accidently leaked urine? N Y  Do you have problems with loss of bowel control? N N  Managing your Medications? N N  Managing your Finances? N N  Housekeeping or managing your Housekeeping? N N     Patient Care Team: Duanne Limerick, MD as PCP - General (Family Medicine) Mertie Moores, MD as Consulting Physician (Specialist)  Indicate any recent Medical Services you may have received from other than [facility] providers in the past year (date may be approximate).  Assessment:   This is a routine wellness examination for [place].  Hearing/Vision screen Hearing Screening - Comments:: Hear well over phone- no hearing aides Vision Screening - Comments:: Wear glasses  Dietary issues and exercise activities discussed:     Goals Addressed             This Visit's Progress    Family to eat at dinner table       "Great-grandkids- watch them grow"       Depression Screen    06/22/2023    9:53 AM 06/22/2023    9:46 AM 05/19/2023    2:26 PM 02/25/2023   10:24 AM 11/08/2022    1:45 PM 10/21/2022    2:05 PM 09/23/2022    4:18 PM  PHQ 2/9 Scores  PHQ - 2 Score 0 0 0 0 0 0 0  PHQ- 9 Score 0 0 0 0 0 0 0    Fall Risk    06/22/2023    9:49 AM 06/18/2023    8:43 AM 06/14/2023   10:40 AM 05/19/2023    2:26 PM 04/03/2023    3:14 PM  Fall Risk   Falls in the past year? 1 1 1  0 1  Number falls in past yr: 1 1 1  0 1  Injury with Fall? 1 1 1  0 0  Comment broke arm      Risk for fall due to : No Fall Risks;History of fall(s);Other (Comment)   No Fall Risks   Risk for fall due to: Comment has been evaluated in ER setting      Follow up Falls evaluation completed;Falls prevention discussed   Falls evaluation completed     MEDICARE RISK AT HOME:  Medicare Risk at Home - 06/22/23 0951     Any stairs in or around the home? Yes    If so, are there any without handrails? No    Home free of loose throw rugs in walkways, pet beds, electrical cords, etc? Yes    Adequate lighting in your home to reduce risk of falls? Yes    Life alert? No   uses Alexa   Use of a cane, walker or w/c? Yes    Grab bars in the bathroom? No   advised to get   Shower chair or bench in shower? Yes     Elevated toilet seat or a handicapped toilet? Yes                Cognitive Function:        06/22/2023    9:53 AM 01/31/2019   10:25 AM 01/23/2018    9:03 AM  6CIT Screen  What Year? 0 points 0 points 0 points  What month? 0 points 0 points 0 points  What time? 0 points 0 points 0 points  Count back from 20 0 points 0 points 0 points  Months in reverse 0 points 0 points 0 points  Repeat phrase 0 points 2 points 4 points  Total Score 0 points 2 points 4 points    Immunizations Immunization History  Administered Date(s) Administered   Fluad Quad(high Dose 65+) 08/15/2019, 08/15/2020, 08/31/2021, 08/24/2022   Influenza, High Dose Seasonal PF 08/30/2017   Influenza,inj,Quad PF,6+ Mos 09/01/2015, 09/09/2016, 08/30/2018   PFIZER Comirnaty(Gray Top)Covid-19 Tri-Sucrose Vaccine 03/18/2021   PFIZER(Purple Top)SARS-COV-2 Vaccination 01/18/2020, 02/12/2020, 09/18/2020   Pfizer Covid-19 Vaccine Bivalent Booster 18yrs & up 09/30/2021   Pneumococcal Conjugate-13 09/01/2015   Pneumococcal Polysaccharide-23 09/08/2017   Tdap 08/30/2017  Zoster Recombinant(Shingrix) 08/15/2019    TDAP status: Up to date  Flu Vaccine status: Up to date  Pneumococcal vaccine status: Up to date  Covid-19 vaccine status: Declined, Education has been provided regarding the importance of this vaccine but patient still declined. Advised may receive this vaccine at local pharmacy or Health Dept.or vaccine clinic. Aware to provide a copy of the vaccination record if obtained from local pharmacy or Health Dept. Verbalized acceptance and understanding.  Qualifies for Shingles Vaccine? Yes   Zostavax completed No   Shingrix Completed?: No.    Education has been provided regarding the importance of this vaccine. Patient has been advised to call insurance company to determine out of pocket expense if they have not yet received this vaccine. Advised may also receive vaccine at local pharmacy or Health Dept.  Verbalized acceptance and understanding. Pt takes prednisone daily and was advised not to get the second dose.  Screening Tests Health Maintenance  Topic Date Due   Zoster Vaccines- Shingrix (2 of 2) 10/10/2019   COVID-19 Vaccine (6 - 2023-24 season) 08/06/2022   Medicare Annual Wellness (AWV)  03/30/2023   MAMMOGRAM  04/14/2023   INFLUENZA VACCINE  07/07/2023   OPHTHALMOLOGY EXAM  08/14/2023   Diabetic kidney evaluation - Urine ACR  08/26/2023   HEMOGLOBIN A1C  08/28/2023   FOOT EXAM  02/25/2024   Diabetic kidney evaluation - eGFR measurement  05/18/2024   Colonoscopy  10/29/2024   DTaP/Tdap/Td (2 - Td or Tdap) 08/31/2027   Pneumonia Vaccine 57+ Years old  Completed   DEXA SCAN  Completed   Hepatitis C Screening  Completed   HPV VACCINES  Aged Out    Health Maintenance  Health Maintenance Due  Topic Date Due   Zoster Vaccines- Shingrix (2 of 2) 10/10/2019   COVID-19 Vaccine (6 - 2023-24 season) 08/06/2022   Medicare Annual Wellness (AWV)  03/30/2023   MAMMOGRAM  04/14/2023    Colorectal cancer screening: Type of screening: Colonoscopy. Completed 10/29/14. Repeat every 10 years  Mammogram status: Completed 04/13/2020. Repeat every year. Pt prefers not to get  Bone Density status: Completed 04/13/2021. Results reflect: Bone density results: OSTEOPENIA. Repeat every 2 years.  Lung Cancer Screening: (Low Dose CT Chest recommended if Age 52-80 years, 20 pack-year currently smoking OR have quit w/in 15years.) does not qualify. Pt sees pulmonology   Additional Screening:  Hepatitis C Screening: does qualify; Completed will order at next visit  Vision Screening: Recommended annual ophthalmology exams for early detection of glaucoma and other disorders of the eye. Is the patient up to date with their annual eye exam?  Yes  Who is the provider or what is the name of the office in which the patient attends annual eye exams? [facility]   Dental Screening: Recommended annual  dental exams for proper oral hygiene-does not see one- advised to get a dental exam  Diabetic Foot Exam: Diabetic Foot Exam: Completed 02/25/23  Community Resource Referral / Chronic Care Management: CRR required this visit?  No   CCM required this visit?  No     Plan:     I have personally reviewed and noted the following in the patient's chart:   Medical and social history Use of alcohol, tobacco or illicit drugs  Current medications and supplements including opioid prescriptions. Patient is not currently taking opioid prescriptions. Functional ability and status Nutritional status Physical activity Advanced directives List of other physicians Hospitalizations, surgeries, and ER visits in previous 12 months Vitals Screenings to include  cognitive, depression, and falls Referrals and appointments  In addition, I have reviewed and discussed with patient certain preventive protocols, quality metrics, and best practice recommendations. A written personalized care plan for preventive services as well as general preventive health recommendations were provided to patient.     Everitt Amber   06/22/2023   After Visit Summary: (MyChart) Due to this being a telephonic visit, the after visit summary with patients personalized plan was offered to patient via MyChart   Nurse Notes: bone density ordered, pt refused mammo

## 2023-06-22 NOTE — Patient Instructions (Signed)
Judy Bolton , Thank you for taking time to come for your Medicare Wellness Visit. I appreciate your ongoing commitment to your health goals. Please review the following plan we discussed and let me know if I can assist you in the future.   These are the goals we discussed:  Goals      DIET - INCREASE WATER INTAKE     Recommend to drink at least 6-8 8oz glasses of water per day.     Family to eat at dinner table     "Great-grandkids- watch them grow"     Patient Stated     Pt states she would like to maintain her health and lower A1C.     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         This is a list of the screening recommended for you and due dates:  Health Maintenance  Topic Date Due   Zoster (Shingles) Vaccine (2 of 2) 10/10/2019   Medicare Annual Wellness Visit  03/30/2023   Mammogram  04/14/2023   COVID-19 Vaccine (6 - 2023-24 season) 07/08/2023*   Flu Shot  07/07/2023   Eye exam for diabetics  08/14/2023   Yearly kidney health urinalysis for diabetes  08/26/2023   Hemoglobin A1C  08/28/2023   Complete foot exam   02/25/2024   Yearly kidney function blood test for diabetes  05/18/2024   Colon Cancer Screening  10/29/2024   DTaP/Tdap/Td vaccine (2 - Td or Tdap) 08/31/2027   Pneumonia Vaccine  Completed   DEXA scan (bone density measurement)  Completed   Hepatitis C Screening  Completed   HPV Vaccine  Aged Out  *Topic was postponed. The date shown is not the original due date.   Health Maintenance After Age 3 After age 43, you are at a higher risk for certain long-term diseases and infections as well as injuries from falls. Falls are a major cause of broken bones and head injuries in people who are older than age 68. Getting regular preventive care can help to keep you healthy and well. Preventive care includes getting regular testing and making lifestyle changes as recommended by your health care provider.  Talk with your health care provider about: Which screenings and tests you should have. A screening is a test that checks for a disease when you have no symptoms. A diet and exercise plan that is right for you. What should I know about screenings and tests to prevent falls? Screening and testing are the best ways to find a health problem early. Early diagnosis and treatment give you the best chance of managing medical conditions that are common after age 43. Certain conditions and lifestyle choices may make you more likely to have a fall. Your health care provider may recommend: Regular vision checks. Poor vision and conditions such as cataracts can make you more likely to have a fall. If you wear glasses, make sure to get your prescription updated if your vision changes. Medicine review. Work with your health care provider to regularly review all of the medicines you are taking, including over-the-counter medicines. Ask your health care provider about any side effects that may make you more likely to have a fall. Tell your health care provider if any medicines that you take make you feel dizzy or sleepy. Strength and balance checks. Your health care provider may recommend certain tests to check your strength and balance while  standing, walking, or changing positions. Foot health exam. Foot pain and numbness, as well as not wearing proper footwear, can make you more likely to have a fall. Screenings, including: Osteoporosis screening. Osteoporosis is a condition that causes the bones to get weaker and break more easily. Blood pressure screening. Blood pressure changes and medicines to control blood pressure can make you feel dizzy. Depression screening. You may be more likely to have a fall if you have a fear of falling, feel depressed, or feel unable to do activities that you used to do. Alcohol use screening. Using too much alcohol can affect your balance and may make you more likely to have a  fall. Follow these instructions at home: Lifestyle Do not drink alcohol if: Your health care provider tells you not to drink. If you drink alcohol: Limit how much you have to: 0-1 drink a day for women. 0-2 drinks a day for men. Know how much alcohol is in your drink. In the [place] one drink equals one 12 oz bottle of beer (355 mL), one 5 oz glass of wine (148 mL), or one 1 oz glass of hard liquor (44 mL). Do not use any products that contain nicotine or tobacco. These products include cigarettes, chewing tobacco, and vaping devices, such as e-cigarettes. If you need help quitting, ask your health care provider. Activity  Follow a regular exercise program to stay fit. This will help you maintain your balance. Ask your health care provider what types of exercise are appropriate for you. If you need a cane or walker, use it as recommended by your health care provider. Wear supportive shoes that have nonskid soles. Safety  Remove any tripping hazards, such as rugs, cords, and clutter. Install safety equipment such as grab bars in bathrooms and safety rails on stairs. Keep rooms and walkways well-lit. General instructions Talk with your health care provider about your risks for falling. Tell your health care provider if: You fall. Be sure to tell your health care provider about all falls, even ones that seem minor. You feel dizzy, tiredness (fatigue), or off-balance. Take over-the-counter and prescription medicines only as told by your health care provider. These include supplements. Eat a healthy diet and maintain a healthy weight. A healthy diet includes low-fat dairy products, low-fat (lean) meats, and fiber from whole grains, beans, and lots of fruits and vegetables. Stay current with your vaccines. Schedule regular health, dental, and eye exams. Summary Having a healthy lifestyle and getting preventive care can help to protect your health and wellness after age 70. Screening and  testing are the best way to find a health problem early and help you avoid having a fall. Early diagnosis and treatment give you the best chance for managing medical conditions that are more common for people who are older than age 108. Falls are a major cause of broken bones and head injuries in people who are older than age 41. Take precautions to prevent a fall at home. Work with your health care provider to learn what changes you can make to improve your health and wellness and to prevent falls. This information is not intended to replace advice given to you by your health care provider. Make sure you discuss any questions you have with your health care provider. Document Revised: 04/13/2021 Document Reviewed: 04/13/2021 Elsevier Patient Education  2024 [employer].

## 2023-06-29 DIAGNOSIS — E1129 Type 2 diabetes mellitus with other diabetic kidney complication: Secondary | ICD-10-CM | POA: Diagnosis not present

## 2023-06-29 DIAGNOSIS — M170 Bilateral primary osteoarthritis of knee: Secondary | ICD-10-CM | POA: Diagnosis not present

## 2023-06-29 DIAGNOSIS — R809 Proteinuria, unspecified: Secondary | ICD-10-CM | POA: Diagnosis not present

## 2023-06-29 DIAGNOSIS — R011 Cardiac murmur, unspecified: Secondary | ICD-10-CM | POA: Diagnosis not present

## 2023-07-05 ENCOUNTER — Other Ambulatory Visit: Payer: Self-pay

## 2023-07-05 DIAGNOSIS — Z780 Asymptomatic menopausal state: Secondary | ICD-10-CM

## 2023-07-26 LAB — HM DIABETES EYE EXAM

## 2023-07-27 ENCOUNTER — Other Ambulatory Visit: Payer: Medicare HMO

## 2023-07-27 DIAGNOSIS — R7982 Elevated C-reactive protein (CRP): Secondary | ICD-10-CM | POA: Diagnosis not present

## 2023-07-27 DIAGNOSIS — R809 Proteinuria, unspecified: Secondary | ICD-10-CM | POA: Diagnosis not present

## 2023-07-27 DIAGNOSIS — E781 Pure hyperglyceridemia: Secondary | ICD-10-CM | POA: Diagnosis not present

## 2023-07-27 DIAGNOSIS — R002 Palpitations: Secondary | ICD-10-CM | POA: Diagnosis not present

## 2023-07-27 DIAGNOSIS — E119 Type 2 diabetes mellitus without complications: Secondary | ICD-10-CM | POA: Diagnosis not present

## 2023-07-27 DIAGNOSIS — I10 Essential (primary) hypertension: Secondary | ICD-10-CM | POA: Diagnosis not present

## 2023-07-27 DIAGNOSIS — M0600 Rheumatoid arthritis without rheumatoid factor, unspecified site: Secondary | ICD-10-CM | POA: Diagnosis not present

## 2023-07-27 DIAGNOSIS — E1129 Type 2 diabetes mellitus with other diabetic kidney complication: Secondary | ICD-10-CM | POA: Diagnosis not present

## 2023-07-27 DIAGNOSIS — R011 Cardiac murmur, unspecified: Secondary | ICD-10-CM | POA: Diagnosis not present

## 2023-08-09 ENCOUNTER — Ambulatory Visit
Admission: RE | Admit: 2023-08-09 | Discharge: 2023-08-09 | Disposition: A | Discharge status: Home / Self Care | Payer: Medicare HMO | Source: Ambulatory Visit | Attending: Family Medicine | Admitting: Family Medicine

## 2023-08-09 DIAGNOSIS — Z780 Asymptomatic menopausal state: Secondary | ICD-10-CM | POA: Diagnosis not present

## 2023-08-09 DIAGNOSIS — M85852 Other specified disorders of bone density and structure, left thigh: Secondary | ICD-10-CM | POA: Diagnosis not present

## 2023-08-10 DIAGNOSIS — K589 Irritable bowel syndrome without diarrhea: Secondary | ICD-10-CM | POA: Diagnosis not present

## 2023-08-10 DIAGNOSIS — Z1211 Encounter for screening for malignant neoplasm of colon: Secondary | ICD-10-CM | POA: Diagnosis not present

## 2023-08-10 DIAGNOSIS — Z86010 Personal history of colonic polyps: Secondary | ICD-10-CM | POA: Diagnosis not present

## 2023-08-10 DIAGNOSIS — R002 Palpitations: Secondary | ICD-10-CM | POA: Diagnosis not present

## 2023-08-10 DIAGNOSIS — K581 Irritable bowel syndrome with constipation: Secondary | ICD-10-CM | POA: Diagnosis not present

## 2023-08-10 DIAGNOSIS — R194 Change in bowel habit: Secondary | ICD-10-CM | POA: Diagnosis not present

## 2023-08-10 DIAGNOSIS — K5900 Constipation, unspecified: Secondary | ICD-10-CM | POA: Diagnosis not present

## 2023-08-11 ENCOUNTER — Ambulatory Visit (INDEPENDENT_AMBULATORY_CARE_PROVIDER_SITE_OTHER): Payer: Medicare HMO | Admitting: Family Medicine

## 2023-08-11 ENCOUNTER — Encounter: Payer: Self-pay | Admitting: Family Medicine

## 2023-08-11 VITALS — BP 128/78 | HR 84 | Temp 98.0°F | Ht 62.0 in | Wt 129.0 lb

## 2023-08-11 DIAGNOSIS — J440 Chronic obstructive pulmonary disease with acute lower respiratory infection: Secondary | ICD-10-CM | POA: Diagnosis not present

## 2023-08-11 DIAGNOSIS — J0100 Acute maxillary sinusitis, unspecified: Secondary | ICD-10-CM | POA: Diagnosis not present

## 2023-08-11 DIAGNOSIS — J209 Acute bronchitis, unspecified: Secondary | ICD-10-CM

## 2023-08-11 DIAGNOSIS — J4521 Mild intermittent asthma with (acute) exacerbation: Secondary | ICD-10-CM | POA: Diagnosis not present

## 2023-08-11 LAB — POC COVID19 BINAXNOW: SARS Coronavirus 2 Ag: NEGATIVE

## 2023-08-11 MED ORDER — AZITHROMYCIN 250 MG PO TABS
ORAL_TABLET | ORAL | 0 refills | Status: AC
Start: 2023-08-11 — End: 2023-08-16

## 2023-08-11 MED ORDER — PREDNISONE 10 MG PO TABS
ORAL_TABLET | ORAL | 1 refills | Status: DC
Start: 2023-08-11 — End: 2023-09-15

## 2023-08-11 NOTE — Progress Notes (Signed)
Date:  08/11/2023   Name:  Judy Bolton   DOB:  [birth year]   MRN:  [id number]   Chief Complaint: Sore Throat (Hurts to swallow. Cough- yellow and green production)  Sore Throat  This is a new problem. The current episode started in the past 7 days. The problem has been waxing and waning. There has been no fever. Associated symptoms include congestion, coughing, headaches and shortness of breath. Pertinent negatives include no abdominal pain, ear discharge, ear pain, hoarse voice, neck pain, swollen glands, trouble swallowing or vomiting. She has had no exposure to strep or mono. The treatment provided mild relief.  Sinusitis This is a new problem. The current episode started in the past 7 days. The problem has been gradually improving since onset. There has been no fever. She is experiencing no pain. Associated symptoms include congestion, coughing, headaches and shortness of breath. Pertinent negatives include no diaphoresis, ear pain, hoarse voice, neck pain, sinus pressure, sneezing, sore throat or swollen glands. Past treatments include nothing. The treatment provided moderate relief.  Asthma She complains of cough, shortness of breath and wheezing. There is no chest tightness, difficulty breathing, frequent throat clearing, hoarse voice or sputum production. This is a chronic problem. The current episode started yesterday. The problem has been gradually worsening. The cough is productive of purulent sputum. Associated symptoms include headaches. Pertinent negatives include no chest pain, ear pain, sneezing, sore throat or trouble swallowing. Her symptoms are alleviated by beta-agonist and ipratropium. Her past medical history is significant for asthma.    Lab Results  Component Value Date   NA 140 05/19/2023   K 4.4 05/19/2023   CO2 21 05/19/2023   GLUCOSE 166 (H) 05/19/2023   BUN 30 (H) 05/19/2023   CREATININE 1.43 (H) 05/19/2023   CALCIUM 8.8 05/19/2023   EGFR 38 (L) 05/19/2023    GFRNONAA 42 (L) 09/21/2022   Lab Results  Component Value Date   CHOL 172 05/04/2022   HDL 64 05/04/2022   LDLCALC 75 05/04/2022   TRIG 203 (H) 05/04/2022   CHOLHDL 2.8 01/24/2018   No results found for: "TSH" Lab Results  Component Value Date   HGBA1C 5.8 02/25/2023   Lab Results  Component Value Date   WBC 9.5 09/21/2022   HGB 14.1 09/21/2022   HCT 43.5 09/21/2022   MCV 86.1 09/21/2022   PLT 260 09/21/2022   Lab Results  Component Value Date   ALT 13 09/20/2022   AST 18 09/20/2022   ALKPHOS 115 09/20/2022   BILITOT 0.7 09/20/2022   No results found for: "25OHVITD2", "25OHVITD3", "VD25OH"   Review of Systems  Constitutional:  Negative for diaphoresis.  HENT:  Positive for congestion. Negative for ear discharge, ear pain, hoarse voice, sinus pressure, sneezing, sore throat and trouble swallowing.   Respiratory:  Positive for cough, chest tightness, shortness of breath and wheezing. Negative for sputum production.   Cardiovascular:  Negative for chest pain, palpitations and leg swelling.  Gastrointestinal:  Negative for abdominal pain and vomiting.  Musculoskeletal:  Negative for neck pain.  Neurological:  Positive for headaches.    Patient Active Problem List   Diagnosis Date Noted   Chronic rhinitis 10/08/2022   Primary osteoarthritis of right hip 08/21/2021   Essential hypertension 08/30/2017   Gastroesophageal reflux disease 08/30/2017   Aortic atherosclerosis (HCC) 05/31/2017   PAD (peripheral artery disease) (HCC) 05/31/2017   Encounter for long-term (current) use of high-risk medication 12/28/2016   Seronegative rheumatoid arthritis (HCC) 12/28/2016  Bilateral hand pain 11/08/2016   Elevated C-reactive protein 11/08/2016   Swelling of joint of right hand 11/08/2016   Type 2 diabetes mellitus with microalbuminuria, without long-term current use of insulin (HCC) 07/06/2016   Acute anxiety 04/16/2016   Bronchitis 04/16/2016   Centrilobular emphysema  (HCC) 04/16/2016   TIA (transient ischemic attack) 02/29/2016   Lumbar stenosis with neurogenic claudication 04/16/2015   Spondylosis of lumbar region without myelopathy or radiculopathy 04/16/2015   Hyperlipidemia 10/15/2014    Allergies  Allergen Reactions   Rosuvastatin Swelling    Neck swelling   Sulfa Antibiotics Itching   Betadine [Povidone Iodine] Swelling   Codeine Itching   Cyclobenzaprine     Other reaction(s): Dizziness   Latex Itching and Swelling    (urinary catheter)   Losartan     intolerance   Meloxicam Swelling   Penicillins Other (See Comments)    Paralysis  Has patient had a PCN reaction causing immediate rash, facial/tongue/throat swelling, SOB or lightheadedness with hypotension: No Has patient had a PCN reaction causing severe rash involving mucus membranes or skin necrosis: No Has patient had a PCN reaction that required hospitalization Yes Has patient had a PCN reaction occurring within the last 10 years: No If all of the above answers are "NO", then may proceed with Cephalosporin use.   Valsartan     Past Surgical History:  Procedure Laterality Date   ABDOMINAL HYSTERECTOMY     ANTERIOR (CYSTOCELE) AND POSTERIOR REPAIR (RECTOCELE) WITH XENFORM GRAFT AND SACROSPINOUS FIXATION     APPENDECTOMY     BROW LIFT Bilateral 09/05/2018   Procedure: BLEPHAROPLASTY UPPER EYELID W/EXCESS SKIN;  Surgeon: Imagene Riches, MD;  Location: [facility];  Service: Ophthalmology;  Laterality: Bilateral;   CATARACT EXTRACTION W/PHACO Left 01/06/2022   Procedure: CATARACT EXTRACTION PHACO AND INTRAOCULAR LENS PLACEMENT (IOC) LEFT DIABETIC 8.09 01:07.3;  Surgeon: Lockie Mola, MD;  Location: [facility];  Service: Ophthalmology;  Laterality: Left;   CATARACT EXTRACTION W/PHACO Right 01/20/2022   Procedure: CATARACT EXTRACTION PHACO AND INTRAOCULAR LENS PLACEMENT (IOC) RIGHT DIABETIC;  Surgeon: Lockie Mola, MD;  Location: [facility];   Service: Ophthalmology;  Laterality: Right;  6.03 01:02.2   CTR Bilateral    LUMBAR LAMINECTOMY/DECOMPRESSION MICRODISCECTOMY N/A 07/27/2017   Procedure: LUMBAR LAMINECTOMY/DECOMPRESSION MICRODISCECTOMY 3 LEVELS-L3-S1;  Surgeon: Venetia Night, MD;  Location: [facility];  Service: Neurosurgery;  Laterality: N/A;   PTOSIS REPAIR Bilateral 09/05/2018   Procedure: PTOSIS REPAIR RESECT EX;  Surgeon: Imagene Riches, MD;  Location: [facility];  Service: Ophthalmology;  Laterality: Bilateral;  Diabetic - oral meds sleep apnea Latexd allergy   ROTATOR CUFF REPAIR Right     Social History   Tobacco Use   Smoking status: Former    Current packs/day: 0.00    Average packs/day: 1.5 packs/day for 28.0 years (42.0 ttl pk-yrs)    Types: Cigarettes    Start date: 08/14/1972    Quit date: 08/14/2000    Years since quitting: 23.0   Smokeless tobacco: Never   Tobacco comments:    smoking cessatiuon materials not required  Vaping Use   Vaping status: Never Used  Substance Use Topics   Alcohol use: No   Drug use: No     Medication list has been reviewed and updated.  Current Meds  Medication Sig   albuterol (PROVENTIL HFA;VENTOLIN HFA) 108 (90 Base) MCG/ACT inhaler Inhale 2 puffs into the lungs every 6 (six) hours as needed for wheezing or shortness of breath.  albuterol (PROVENTIL) (2.5 MG/3ML) 0.083% nebulizer solution Take 3 mLs (2.5 mg total) by nebulization every 6 (six) hours as needed for wheezing or shortness of breath.   ALPRAZolam (XANAX) 0.25 MG tablet Take 1 tablet (0.25 mg total) by mouth 2 (two) times daily as needed for anxiety.   amLODipine (NORVASC) 5 MG tablet Take 1 tablet (5 mg total) by mouth daily.   atorvastatin (LIPITOR) 40 MG tablet Take 1 tablet (40 mg total) by mouth daily.   celecoxib (CELEBREX) 200 MG capsule Take 1 capsule by mouth daily.   cholecalciferol (VITAMIN D3) 25 MCG (1000 UNIT) tablet Take 1,000 Units by mouth daily.   FASENRA 30 MG/ML SOSY Once  every 2 months for asthma. Paid for by [employer]   gabapentin (NEURONTIN) 300 MG capsule Take 1 capsule by mouth daily.   gemfibrozil (LOPID) 600 MG tablet Take 1 tablet (600 mg total) by mouth 2 (two) times daily.   hydrochlorothiazide (HYDRODIURIL) 25 MG tablet Take 1 tablet (25 mg total) by mouth daily.   ipratropium (ATROVENT) 0.02 % nebulizer solution Inhale into the lungs.   metFORMIN (GLUCOPHAGE) 500 MG tablet Take 500 mg by mouth 2 (two) times daily.   metoprolol tartrate (LOPRESSOR) 50 MG tablet Take 1 tablet (50 mg total) by mouth 2 (two) times daily.   montelukast (SINGULAIR) 10 MG tablet Take 1 tablet (10 mg total) by mouth daily.   pantoprazole (PROTONIX) 40 MG tablet Take 1 tablet (40 mg total) by mouth daily.   vitamin B-12 (CYANOCOBALAMIN) 1000 MCG tablet Take 1,000 mcg by mouth daily.       08/11/2023    3:50 PM 05/19/2023    2:27 PM 02/25/2023   10:24 AM 11/08/2022    1:45 PM  GAD 7 : Generalized Anxiety Score  Nervous, Anxious, on Edge 0 0 1 0  Control/stop worrying 0 0 1 0  Worry too much - different things 0 0 1 0  Trouble relaxing 0 0 0 0  Restless 0 0 0 0  Easily annoyed or irritable 0 0 0 0  Afraid - awful might happen 0 0 0 0  Total GAD 7 Score 0 0 3 0  Anxiety Difficulty Not difficult at all Not difficult at all Not difficult at all Not difficult at all       08/11/2023    3:49 PM 06/22/2023    9:53 AM 06/22/2023    9:46 AM  Depression screen PHQ 2/9  Decreased Interest 0 0 0  Down, Depressed, Hopeless 0 0 0  PHQ - 2 Score 0 0 0  Altered sleeping 0 0 0  Tired, decreased energy 0 0 0  Change in appetite 0 0 0  Feeling bad or failure about yourself  0 0 0  Trouble concentrating 0 0 0  Moving slowly or fidgety/restless 0 0 0  Suicidal thoughts 0 0 0  PHQ-9 Score 0 0 0  Difficult doing work/chores Not difficult at all Not difficult at all Not difficult at all    BP Readings from Last 3 Encounters:  08/11/23 128/78  05/19/23 128/78  04/05/23 128/78     Physical Exam Vitals and nursing note reviewed. Exam conducted with a chaperone present.  Constitutional:      General: She is not in acute distress.    Appearance: She is not diaphoretic.  HENT:     Head: Normocephalic and atraumatic.     Right Ear: Tympanic membrane and external ear normal. No drainage, swelling or tenderness.  Left Ear: Tympanic membrane and external ear normal. No drainage, swelling or tenderness.     Nose: Congestion and rhinorrhea present.  Eyes:     General:        Right eye: No discharge.        Left eye: No discharge.     Conjunctiva/sclera: Conjunctivae normal.     Pupils: Pupils are equal, round, and reactive to light.  Neck:     Thyroid: No thyromegaly.     Vascular: No JVD.  Cardiovascular:     Rate and Rhythm: Normal rate and regular rhythm.     Heart sounds: Normal heart sounds. No murmur heard.    No friction rub. No gallop.  Pulmonary:     Effort: Pulmonary effort is normal.     Breath sounds: Normal breath sounds. Decreased air movement present. No decreased breath sounds, wheezing, rhonchi or rales.  Abdominal:     General: Bowel sounds are normal.     Palpations: Abdomen is soft. There is no mass.     Tenderness: There is no abdominal tenderness. There is no guarding.  Musculoskeletal:        General: Normal range of motion.     Cervical back: Normal range of motion and neck supple.  Lymphadenopathy:     Cervical: No cervical adenopathy.  Skin:    General: Skin is warm and dry.  Neurological:     Mental Status: She is alert.     Deep Tendon Reflexes: Reflexes are normal and symmetric.     Wt Readings from Last 3 Encounters:  08/11/23 129 lb (58.5 kg)  06/22/23 128 lb (58.1 kg)  05/19/23 134 lb (60.8 kg)    BP 128/78   Pulse 84   Temp 98 F (36.7 C) (Oral)   Ht 5\' 2"  (1.575 m)   Wt 129 lb (58.5 kg)   SpO2 97%   BMI 23.59 kg/m   Assessment and Plan:  1. Acute maxillary sinusitis, recurrence not specified New  onset.  Persistent.  Stable.  History and examination are consistent with a maxillary sinusitis.  Will treat with azithromycin to 50 mg 2 tablets today followed by 1 a day for 4 days. - azithromycin (ZITHROMAX) 250 MG tablet; Take 2 tablets on day 1, then 1 tablet daily on days 2 through 5  Dispense: 6 tablet; Refill: 0 - POC COVID-19  2. Mild intermittent reactive airway disease with acute exacerbation Chronic.  Episodic.  Intermittent but somewhat active at this time because there is decrease in air movement.  Wheezes are noted generalized.  Will put on tapering dose of prednisone and that she is already on a daily dose of prednisone.  Checking of COVID is negative for viral infection.  So we will treat with azithromycin for the possibility of atypical.  Patient has been encouraged to take Mucinex DM for cough. - predniSONE (DELTASONE) 10 MG tablet; Taper 6,6,6,5,5,5,4,4,3,3,2,2,1,1  Dispense: 53 tablet; Refill: 1 - POC COVID-19  3. COPD with acute bronchitis (HCC) As noted above   Elizabeth Sauer, MD

## 2023-08-15 DIAGNOSIS — H4321 Crystalline deposits in vitreous body, right eye: Secondary | ICD-10-CM | POA: Diagnosis not present

## 2023-08-15 DIAGNOSIS — Z0100 Encounter for examination of eyes and vision without abnormal findings: Secondary | ICD-10-CM | POA: Diagnosis not present

## 2023-08-15 DIAGNOSIS — Z961 Presence of intraocular lens: Secondary | ICD-10-CM | POA: Diagnosis not present

## 2023-08-15 DIAGNOSIS — E113293 Type 2 diabetes mellitus with mild nonproliferative diabetic retinopathy without macular edema, bilateral: Secondary | ICD-10-CM | POA: Diagnosis not present

## 2023-08-15 DIAGNOSIS — H353131 Nonexudative age-related macular degeneration, bilateral, early dry stage: Secondary | ICD-10-CM | POA: Diagnosis not present

## 2023-08-15 LAB — HM DIABETES EYE EXAM

## 2023-08-18 ENCOUNTER — Ambulatory Visit: Payer: Self-pay | Admitting: *Deleted

## 2023-08-18 NOTE — Telephone Encounter (Signed)
Pt hung up before agent could transfer her.    Reason for Disposition  [1] Longstanding difficulty breathing AND [2] not responding to usual therapy    Inhalers not working  Answer Assessment - Initial Assessment Questions 1. RESPIRATORY STATUS: "Describe your breathing?" (e.g., wheezing, shortness of breath, unable to speak, severe coughing)      I'm having shortness of breath.    I had a sinus infection and Dr. Yetta Barre gave me medicine.   I finished the prednisone taper.  My breathing is terrible.    My breath is short.   Coughing a lot.   2. ONSET: "When did this breathing problem begin?"      It started this morning 3. PATTERN "Does the difficult breathing come and go, or has it been constant since it started?"      constant 4. SEVERITY: "How bad is your breathing?" (e.g., mild, moderate, severe)    - MILD: No SOB at rest, mild SOB with walking, speaks normally in sentences, can lie down, no retractions, pulse < 100.    - MODERATE: SOB at rest, SOB with minimal exertion and prefers to sit, cannot lie down flat, speaks in phrases, mild retractions, audible wheezing, pulse 100-120.    - SEVERE: Very SOB at rest, speaks in single words, struggling to breathe, sitting hunched forward, retractions, pulse > 120      Moderate 5. RECURRENT SYMPTOM: "Have you had difficulty breathing before?" If Yes, ask: "When was the last time?" and "What happened that time?"      Not asked 6. CARDIAC HISTORY: "Do you have any history of heart disease?" (e.g., heart attack, angina, bypass surgery, angioplasty)      Not asked 7. LUNG HISTORY: "Do you have any history of lung disease?"  (e.g., pulmonary embolus, asthma, emphysema)     Not asked 8. CAUSE: "What do you think is causing the breathing problem?"      I think the sinus infection has gotten down into my chest. 9. OTHER SYMPTOMS: "Do you have any other symptoms? (e.g., dizziness, runny nose, cough, chest pain, fever)     Coughing a lot , having shortness  of breath 10. O2 SATURATION MONITOR:  "Do you use an oxygen saturation monitor (pulse oximeter) at home?" If Yes, ask: "What is your reading (oxygen level) today?" "What is your usual oxygen saturation reading?" (e.g., 95%)       N/A 11. PREGNANCY: "Is there any chance you are pregnant?" "When was your last menstrual period?"       *No Answer* 12. TRAVEL: "Have you traveled out of the country in the last month?" (e.g., travel history, exposures)       *No Answer*  Protocols used: Breathing Difficulty-A-AH

## 2023-08-18 NOTE — Telephone Encounter (Signed)
Please call pt to schedule same day appt tomorrow.  KP

## 2023-08-18 NOTE — Telephone Encounter (Signed)
  Chief Complaint: Coughing a lot, shortness of breath.  Completed steroid and antibiotic that was prescribed for her sinus infection.   It is better, the sinus infection but now the congestion is in her chest. Symptoms: Coughing and shortness of breath Frequency: This morning Pertinent Negatives: Patient denies N/A Disposition: [] ED /[] Urgent Care (no appt availability in office) / [x] Appointment(In office/virtual)/ []  [facility]/ [] Home Care/ [] Refused Recommended Disposition /[] [facility]/ []  Follow-up with PCP Additional Notes: No appts available so she is requesting to be worked in.    She was upset that there were no appts.   I let her know I would send a message and see if she could be worked in.    She was agreeable to this plan.    "I want to be seen tomorrow".   "I've been a pt there for 30 years they usually work me in".    I let her know I was sending a high priority message.

## 2023-08-19 ENCOUNTER — Encounter: Payer: Self-pay | Admitting: Physician Assistant

## 2023-08-19 ENCOUNTER — Ambulatory Visit (INDEPENDENT_AMBULATORY_CARE_PROVIDER_SITE_OTHER): Payer: Medicare HMO | Admitting: Physician Assistant

## 2023-08-19 VITALS — BP 122/66 | HR 77 | Temp 98.2°F | Ht 62.0 in | Wt 127.0 lb

## 2023-08-19 DIAGNOSIS — J440 Chronic obstructive pulmonary disease with acute lower respiratory infection: Secondary | ICD-10-CM

## 2023-08-19 DIAGNOSIS — J209 Acute bronchitis, unspecified: Secondary | ICD-10-CM

## 2023-08-19 MED ORDER — BENZONATATE 200 MG PO CAPS
200.0000 mg | ORAL_CAPSULE | Freq: Three times a day (TID) | ORAL | 0 refills | Status: AC | PRN
Start: 2023-08-19 — End: 2023-08-29

## 2023-08-19 MED ORDER — GUAIFENESIN-CODEINE 100-10 MG/5ML PO SYRP
10.0000 mL | ORAL_SOLUTION | Freq: Four times a day (QID) | ORAL | 0 refills | Status: DC | PRN
Start: 2023-08-19 — End: 2023-11-18

## 2023-08-19 NOTE — Progress Notes (Signed)
Date:  08/19/2023   Name:  Judy Bolton   DOB:  May 07, 1946   MRN:  [id number]   Chief Complaint: Cough (Covid test last week negative )  Cough This is a new problem. Episode onset: X2 days. The problem has been gradually worsening. The problem occurs hourly. The cough is Productive of sputum (clear in color). Associated symptoms include headaches, nasal congestion, postnasal drip, rhinorrhea, shortness of breath and wheezing. Nothing aggravates the symptoms. She has tried rest and steroid inhaler for the symptoms. The treatment provided mild relief. Her past medical history is significant for asthma and emphysema.   Judy Bolton is a delightful 77 year old female with COPD and asthma new to me today, typically sees my colleague Dr. Elizabeth Sauer, MD, here for evaluation of persistent cough.  She was last seen by Dr. Yetta Barre for this problem 08/11/2023 with concurrent sinusitis, and she was treated with azithromycin and prednisone taper.  Patient feels the problem has moved from the sinuses down into the lungs.  Clear sputum, no fever.  Using nebulizer treatment 3-4 times per day.  Oxygenating fairly well at home upper 90s.  Says Dr. Yetta Barre sometimes prescribes a cough syrup containing codeine and Tessalon Perles, which historically have been beneficial.    Medication list has been reviewed and updated.  Current Meds  Medication Sig   albuterol (PROVENTIL HFA;VENTOLIN HFA) 108 (90 Base) MCG/ACT inhaler Inhale 2 puffs into the lungs every 6 (six) hours as needed for wheezing or shortness of breath.   albuterol (PROVENTIL) (2.5 MG/3ML) 0.083% nebulizer solution Take 3 mLs (2.5 mg total) by nebulization every 6 (six) hours as needed for wheezing or shortness of breath.   ALPRAZolam (XANAX) 0.25 MG tablet Take 1 tablet (0.25 mg total) by mouth 2 (two) times daily as needed for anxiety.   amLODipine (NORVASC) 5 MG tablet Take 1 tablet (5 mg total) by mouth daily.   atorvastatin (LIPITOR) 40 MG  tablet Take 1 tablet (40 mg total) by mouth daily.   benzonatate (TESSALON) 200 MG capsule Take 1 capsule (200 mg total) by mouth 3 (three) times daily as needed for up to 10 days for cough.   celecoxib (CELEBREX) 200 MG capsule Take 1 capsule by mouth daily.   cholecalciferol (VITAMIN D3) 25 MCG (1000 UNIT) tablet Take 1,000 Units by mouth daily.   FASENRA 30 MG/ML SOSY Once every 2 months for asthma. Paid for by [employer]   gabapentin (NEURONTIN) 300 MG capsule Take 1 capsule by mouth daily.   gemfibrozil (LOPID) 600 MG tablet Take 1 tablet (600 mg total) by mouth 2 (two) times daily.   guaiFENesin-codeine (ROBITUSSIN AC) 100-10 MG/5ML syrup Take 10 mLs by mouth 4 (four) times daily as needed for cough. Use only for severe cough. Take with FOOD and plenty of WATER.   hydrochlorothiazide (HYDRODIURIL) 25 MG tablet Take 1 tablet (25 mg total) by mouth daily.   ipratropium (ATROVENT) 0.02 % nebulizer solution Inhale into the lungs.   metFORMIN (GLUCOPHAGE) 500 MG tablet Take 500 mg by mouth 2 (two) times daily.   metoprolol tartrate (LOPRESSOR) 50 MG tablet Take 1 tablet (50 mg total) by mouth 2 (two) times daily. (Patient taking differently: Take 100 mg by mouth 2 (two) times daily.)   montelukast (SINGULAIR) 10 MG tablet Take 1 tablet (10 mg total) by mouth daily.   pantoprazole (PROTONIX) 40 MG tablet Take 1 tablet (40 mg total) by mouth daily.   predniSONE (DELTASONE) 10 MG tablet Taper 6,6,6,5,5,5,4,4,3,3,2,2,1,1  vitamin B-12 (CYANOCOBALAMIN) 1000 MCG tablet Take 1,000 mcg by mouth daily.     Review of Systems  HENT:  Positive for postnasal drip and rhinorrhea.   Respiratory:  Positive for cough, shortness of breath and wheezing.   Neurological:  Positive for headaches.    Patient Active Problem List   Diagnosis Date Noted   Chronic rhinitis 10/08/2022   Primary osteoarthritis of right hip 08/21/2021   Essential hypertension 08/30/2017   Gastroesophageal reflux disease  08/30/2017   Aortic atherosclerosis (HCC) 05/31/2017   PAD (peripheral artery disease) (HCC) 05/31/2017   Encounter for long-term (current) use of high-risk medication 12/28/2016   Seronegative rheumatoid arthritis (HCC) 12/28/2016   Bilateral hand pain 11/08/2016   Elevated C-reactive protein 11/08/2016   Swelling of joint of right hand 11/08/2016   Type 2 diabetes mellitus with microalbuminuria, without long-term current use of insulin (HCC) 07/06/2016   Acute anxiety 04/16/2016   Bronchitis 04/16/2016   Centrilobular emphysema (HCC) 04/16/2016   TIA (transient ischemic attack) 02/29/2016   Lumbar stenosis with neurogenic claudication 04/16/2015   Spondylosis of lumbar region without myelopathy or radiculopathy 04/16/2015   Hyperlipidemia 10/15/2014    Allergies  Allergen Reactions   Rosuvastatin Swelling    Neck swelling   Sulfa Antibiotics Itching   Betadine [Povidone Iodine] Swelling   Codeine Itching   Cyclobenzaprine     Other reaction(s): Dizziness   Latex Itching and Swelling    (urinary catheter)   Losartan     intolerance   Meloxicam Swelling   Penicillins Other (See Comments)    Paralysis  Has patient had a PCN reaction causing immediate rash, facial/tongue/throat swelling, SOB or lightheadedness with hypotension: No Has patient had a PCN reaction causing severe rash involving mucus membranes or skin necrosis: No Has patient had a PCN reaction that required hospitalization Yes Has patient had a PCN reaction occurring within the last 10 years: No If all of the above answers are "NO", then may proceed with Cephalosporin use.   Valsartan     Immunization History  Administered Date(s) Administered   Fluad Quad(high Dose 65+) 08/15/2019, 08/15/2020, 08/31/2021, 08/24/2022   Influenza, High Dose Seasonal PF 08/30/2017   Influenza,inj,Quad PF,6+ Mos 09/01/2015, 09/09/2016, 08/30/2018   PFIZER Comirnaty(Gray Top)Covid-19 Tri-Sucrose Vaccine 03/18/2021    PFIZER(Purple Top)SARS-COV-2 Vaccination 01/18/2020, 02/12/2020, 09/18/2020   Pfizer Covid-19 Vaccine Bivalent Booster 54yrs & up 09/30/2021   Pneumococcal Conjugate-13 09/01/2015   Pneumococcal Polysaccharide-23 09/08/2017   Tdap 08/30/2017   Zoster Recombinant(Shingrix) 08/15/2019    Past Surgical History:  Procedure Laterality Date   ABDOMINAL HYSTERECTOMY     ANTERIOR (CYSTOCELE) AND POSTERIOR REPAIR (RECTOCELE) WITH XENFORM GRAFT AND SACROSPINOUS FIXATION     APPENDECTOMY     BROW LIFT Bilateral 09/05/2018   Procedure: BLEPHAROPLASTY UPPER EYELID W/EXCESS SKIN;  Surgeon: Imagene Riches, MD;  Location: [facility];  Service: Ophthalmology;  Laterality: Bilateral;   CATARACT EXTRACTION W/PHACO Left 01/06/2022   Procedure: CATARACT EXTRACTION PHACO AND INTRAOCULAR LENS PLACEMENT (IOC) LEFT DIABETIC 8.09 01:07.3;  Surgeon: Lockie Mola, MD;  Location: [facility];  Service: Ophthalmology;  Laterality: Left;   CATARACT EXTRACTION W/PHACO Right 01/20/2022   Procedure: CATARACT EXTRACTION PHACO AND INTRAOCULAR LENS PLACEMENT (IOC) RIGHT DIABETIC;  Surgeon: Lockie Mola, MD;  Location: [facility];  Service: Ophthalmology;  Laterality: Right;  6.03 01:02.2   CTR Bilateral    LUMBAR LAMINECTOMY/DECOMPRESSION MICRODISCECTOMY N/A 07/27/2017   Procedure: LUMBAR LAMINECTOMY/DECOMPRESSION MICRODISCECTOMY 3 LEVELS-L3-S1;  Surgeon: Venetia Night, MD;  Location: [facility];  Service: Neurosurgery;  Laterality: N/A;   PTOSIS REPAIR Bilateral 09/05/2018   Procedure: PTOSIS REPAIR RESECT EX;  Surgeon: Imagene Riches, MD;  Location: [facility];  Service: Ophthalmology;  Laterality: Bilateral;  Diabetic - oral meds sleep apnea Latexd allergy   ROTATOR CUFF REPAIR Right     Social History   Tobacco Use   Smoking status: Former    Current packs/day: 0.00    Average packs/day: 1.5 packs/day for 28.0 years (42.0 ttl pk-yrs)    Types: Cigarettes    Start  date: 08/14/1972    Quit date: 08/14/2000    Years since quitting: 23.0   Smokeless tobacco: Never   Tobacco comments:    smoking cessatiuon materials not required  Vaping Use   Vaping status: Never Used  Substance Use Topics   Alcohol use: No   Drug use: No    Family History  Problem Relation Age of Onset   Heart attack Mother    Prostate cancer Father    Brain cancer Father    Heart attack Maternal Grandmother    Heart attack Maternal Grandfather    Breast cancer Paternal Grandmother         08/19/2023    3:57 PM 08/11/2023    3:50 PM 05/19/2023    2:27 PM 02/25/2023   10:24 AM  GAD 7 : Generalized Anxiety Score  Nervous, Anxious, on Edge 0 0 0 1  Control/stop worrying 0 0 0 1  Worry too much - different things 0 0 0 1  Trouble relaxing 0 0 0 0  Restless 0 0 0 0  Easily annoyed or irritable 0 0 0 0  Afraid - awful might happen 0 0 0 0  Total GAD 7 Score 0 0 0 3  Anxiety Difficulty Not difficult at all Not difficult at all Not difficult at all Not difficult at all       08/19/2023    3:57 PM 08/11/2023    3:49 PM 06/22/2023    9:53 AM  Depression screen PHQ 2/9  Decreased Interest 0 0 0  Down, Depressed, Hopeless 0 0 0  PHQ - 2 Score 0 0 0  Altered sleeping 0 0 0  Tired, decreased energy 0 0 0  Change in appetite 0 0 0  Feeling bad or failure about yourself  0 0 0  Trouble concentrating 0 0 0  Moving slowly or fidgety/restless 0 0 0  Suicidal thoughts 0 0 0  PHQ-9 Score 0 0 0  Difficult doing work/chores Not difficult at all Not difficult at all Not difficult at all    BP Readings from Last 3 Encounters:  08/19/23 122/66  08/11/23 128/78  05/19/23 128/78    Wt Readings from Last 3 Encounters:  08/19/23 127 lb (57.6 kg)  08/11/23 129 lb (58.5 kg)  06/22/23 128 lb (58.1 kg)    BP 122/66   Pulse 77   Temp 98.2 F (36.8 C) (Oral)   Ht 5\' 2"  (1.575 m)   Wt 127 lb (57.6 kg)   SpO2 97%   BMI 23.23 kg/m   Physical Exam Vitals and nursing note reviewed.   Constitutional:      Appearance: Normal appearance.  Cardiovascular:     Rate and Rhythm: Normal rate and regular rhythm.     Heart sounds: No murmur heard.    No friction rub. No gallop.  Pulmonary:     Effort: Pulmonary effort is normal.  Breath sounds: Decreased air movement present. No wheezing, rhonchi or rales.  Abdominal:     General: There is no distension.  Musculoskeletal:        General: Normal range of motion.  Skin:    General: Skin is warm and dry.  Neurological:     Mental Status: She is alert and oriented to person, place, and time.     Gait: Gait is intact.  Psychiatric:        Mood and Affect: Mood and affect normal.     Recent Labs     Component Value Date/Time   NA 140 05/19/2023 1521   NA 142 02/29/2012 1645   K 4.4 05/19/2023 1521   K 4.7 08/08/2014 0912   CL 104 05/19/2023 1521   CL 107 02/29/2012 1645   CO2 21 05/19/2023 1521   CO2 21 02/29/2012 1645   GLUCOSE 166 (H) 05/19/2023 1521   GLUCOSE 132 (H) 09/21/2022 1248   GLUCOSE 131 (H) 02/29/2012 1645   BUN 30 (H) 05/19/2023 1521   BUN 33 (H) 02/29/2012 1645   CREATININE 1.43 (H) 05/19/2023 1521   CREATININE 1.16 02/29/2012 1645   CALCIUM 8.8 05/19/2023 1521   CALCIUM 9.2 02/29/2012 1645   PROT 7.3 09/20/2022 1812   PROT 6.6 05/04/2022 1358   PROT 7.7 02/29/2012 1645   ALBUMIN 4.2 05/19/2023 1521   ALBUMIN 4.1 02/29/2012 1645   AST 18 09/20/2022 1812   AST 27 02/29/2012 1645   ALT 13 09/20/2022 1812   ALT 30 02/29/2012 1645   ALKPHOS 115 09/20/2022 1812   ALKPHOS 94 02/29/2012 1645   BILITOT 0.7 09/20/2022 1812   BILITOT 0.3 05/04/2022 1358   BILITOT 0.3 02/29/2012 1645   GFRNONAA 42 (L) 09/21/2022 1248   GFRNONAA 50 (L) 02/29/2012 1645   GFRAA 52 (L) 10/03/2019 1053   GFRAA >60 02/29/2012 1645    Lab Results  Component Value Date   WBC 9.5 09/21/2022   HGB 14.1 09/21/2022   HCT 43.5 09/21/2022   MCV 86.1 09/21/2022   PLT 260 09/21/2022   Lab Results  Component Value  Date   HGBA1C 5.8 02/25/2023   Lab Results  Component Value Date   CHOL 172 05/04/2022   HDL 64 05/04/2022   LDLCALC 75 05/04/2022   TRIG 203 (H) 05/04/2022   CHOLHDL 2.8 01/24/2018   No results found for: "TSH"   Assessment and Plan:  1. COPD with acute bronchitis (HCC) Lungs actually sound pretty clear today.  No fever, O2 97% breathing comfortably on exam today aside from occasional cough.  Will prescribe guaifenesin codeine and benzonatate as below for symptomatic relief of cough.  Hold off on additional antibiotics at this time.  Cautioned on side effects of codeine including sedation and constipation.  Expect gradual recovery over the next few days, patient to contact the office early next week if still no improvement.  - guaiFENesin-codeine (ROBITUSSIN AC) 100-10 MG/5ML syrup; Take 10 mLs by mouth 4 (four) times daily as needed for cough. Use only for severe cough. Take with FOOD and plenty of WATER.  Dispense: 118 mL; Refill: 0 - benzonatate (TESSALON) 200 MG capsule; Take 1 capsule (200 mg total) by mouth 3 (three) times daily as needed for up to 10 days for cough.  Dispense: 30 capsule; Refill: 0   Follow-up as needed   Alvester Morin, PA-C, DMSc, Nutritionist [facility] [phone]

## 2023-08-29 DIAGNOSIS — R809 Proteinuria, unspecified: Secondary | ICD-10-CM | POA: Diagnosis not present

## 2023-08-29 DIAGNOSIS — N1832 Chronic kidney disease, stage 3b: Secondary | ICD-10-CM | POA: Diagnosis not present

## 2023-08-29 DIAGNOSIS — E1159 Type 2 diabetes mellitus with other circulatory complications: Secondary | ICD-10-CM | POA: Diagnosis not present

## 2023-08-29 DIAGNOSIS — E1169 Type 2 diabetes mellitus with other specified complication: Secondary | ICD-10-CM | POA: Diagnosis not present

## 2023-08-29 DIAGNOSIS — E785 Hyperlipidemia, unspecified: Secondary | ICD-10-CM | POA: Diagnosis not present

## 2023-08-29 DIAGNOSIS — E1129 Type 2 diabetes mellitus with other diabetic kidney complication: Secondary | ICD-10-CM | POA: Diagnosis not present

## 2023-08-29 DIAGNOSIS — I152 Hypertension secondary to endocrine disorders: Secondary | ICD-10-CM | POA: Diagnosis not present

## 2023-08-29 DIAGNOSIS — E538 Deficiency of other specified B group vitamins: Secondary | ICD-10-CM | POA: Diagnosis not present

## 2023-08-29 LAB — HEMOGLOBIN A1C: Hemoglobin A1C: 6.4

## 2023-08-29 LAB — MICROALBUMIN, URINE: Microalb, Ur: 36.5

## 2023-08-29 LAB — PROTEIN / CREATININE RATIO, URINE: Albumin, U: 240

## 2023-08-29 LAB — MICROALBUMIN / CREATININE URINE RATIO: Microalb Creat Ratio: 657.5

## 2023-09-07 DIAGNOSIS — R011 Cardiac murmur, unspecified: Secondary | ICD-10-CM | POA: Diagnosis not present

## 2023-09-07 DIAGNOSIS — I739 Peripheral vascular disease, unspecified: Secondary | ICD-10-CM | POA: Diagnosis not present

## 2023-09-07 DIAGNOSIS — R002 Palpitations: Secondary | ICD-10-CM | POA: Diagnosis not present

## 2023-09-07 DIAGNOSIS — M0600 Rheumatoid arthritis without rheumatoid factor, unspecified site: Secondary | ICD-10-CM | POA: Diagnosis not present

## 2023-09-07 DIAGNOSIS — I10 Essential (primary) hypertension: Secondary | ICD-10-CM | POA: Diagnosis not present

## 2023-09-07 DIAGNOSIS — R7982 Elevated C-reactive protein (CRP): Secondary | ICD-10-CM | POA: Diagnosis not present

## 2023-09-07 DIAGNOSIS — E1129 Type 2 diabetes mellitus with other diabetic kidney complication: Secondary | ICD-10-CM | POA: Diagnosis not present

## 2023-09-07 DIAGNOSIS — E781 Pure hyperglyceridemia: Secondary | ICD-10-CM | POA: Diagnosis not present

## 2023-09-07 DIAGNOSIS — E119 Type 2 diabetes mellitus without complications: Secondary | ICD-10-CM | POA: Diagnosis not present

## 2023-09-15 ENCOUNTER — Ambulatory Visit (INDEPENDENT_AMBULATORY_CARE_PROVIDER_SITE_OTHER): Payer: Medicare HMO | Admitting: Family Medicine

## 2023-09-15 ENCOUNTER — Encounter: Payer: Self-pay | Admitting: Family Medicine

## 2023-09-15 ENCOUNTER — Ambulatory Visit: Payer: Self-pay | Admitting: *Deleted

## 2023-09-15 VITALS — BP 124/70 | HR 73 | Ht 62.0 in | Wt 127.0 lb

## 2023-09-15 DIAGNOSIS — N3090 Cystitis, unspecified without hematuria: Secondary | ICD-10-CM | POA: Diagnosis not present

## 2023-09-15 LAB — POCT URINALYSIS DIPSTICK
Bilirubin, UA: NEGATIVE
Glucose, UA: NEGATIVE
Ketones, UA: NEGATIVE
Nitrite, UA: POSITIVE
Protein, UA: POSITIVE — AB
Spec Grav, UA: 1.025 (ref 1.010–1.025)
Urobilinogen, UA: 0.2 U/dL
pH, UA: 6 (ref 5.0–8.0)

## 2023-09-15 MED ORDER — NITROFURANTOIN MONOHYD MACRO 100 MG PO CAPS
100.0000 mg | ORAL_CAPSULE | Freq: Two times a day (BID) | ORAL | 0 refills | Status: AC
Start: 2023-09-15 — End: 2023-09-22

## 2023-09-15 NOTE — Telephone Encounter (Signed)
  Chief Complaint: UTI Symptoms: burning, abd pain, frequency Frequency: Pertinent Negatives: Patient denies Started Tues. Disposition: [] ED /[] Urgent Care (no appt availability in office) / [x] Appointment(In office/virtual)/ []  [place] [facility]/ [] Home Care/ [] Refused Recommended Disposition /[] [place] [facility]/ []  Follow-up with PCP Additional Notes: Appt made for today with Dr. Yetta Barre for 10:20.

## 2023-09-15 NOTE — Progress Notes (Signed)
Date:  09/15/2023   Name:  Judy Bolton   DOB:  Aug 29, 1946   MRN:  [id number]   Chief Complaint: Urinary Tract Infection (Started 2 days ago. Burning and pelvic pressure. Patient started AZO, and this is not helping. Symptoms have gotten worse.)  Urinary Tract Infection  This is a chronic problem. The current episode started more than 1 year ago. The problem occurs intermittently. The problem has been waxing and waning. The quality of the pain is described as aching. The patient is experiencing no pain. There has been no fever. Pertinent negatives include no chills, discharge, flank pain, frequency, hematuria, hesitancy, nausea, sweats, urgency or vomiting. The treatment provided moderate relief.    Lab Results  Component Value Date   NA 140 05/19/2023   K 4.4 05/19/2023   CO2 21 05/19/2023   GLUCOSE 166 (H) 05/19/2023   BUN 30 (H) 05/19/2023   CREATININE 1.43 (H) 05/19/2023   CALCIUM 8.8 05/19/2023   EGFR 38 (L) 05/19/2023   GFRNONAA 42 (L) 09/21/2022   Lab Results  Component Value Date   CHOL 172 05/04/2022   HDL 64 05/04/2022   LDLCALC 75 05/04/2022   TRIG 203 (H) 05/04/2022   CHOLHDL 2.8 01/24/2018   No results found for: "TSH" Lab Results  Component Value Date   HGBA1C 5.8 02/25/2023   Lab Results  Component Value Date   WBC 9.5 09/21/2022   HGB 14.1 09/21/2022   HCT 43.5 09/21/2022   MCV 86.1 09/21/2022   PLT 260 09/21/2022   Lab Results  Component Value Date   ALT 13 09/20/2022   AST 18 09/20/2022   ALKPHOS 115 09/20/2022   BILITOT 0.7 09/20/2022   No results found for: "25OHVITD2", "25OHVITD3", "VD25OH"   Review of Systems  Constitutional: Negative.  Negative for chills, fatigue, fever and unexpected weight change.  HENT:  Negative for congestion, ear discharge, ear pain, rhinorrhea, sinus pressure, sneezing and sore throat.   Respiratory:  Negative for cough, shortness of breath, wheezing and stridor.   Gastrointestinal:  Negative for  abdominal pain, blood in stool, constipation, diarrhea, nausea and vomiting.  Genitourinary:  Negative for dysuria, flank pain, frequency, hematuria, hesitancy, urgency and vaginal discharge.  Musculoskeletal:  Negative for arthralgias, back pain and myalgias.  Skin:  Negative for rash.  Neurological:  Negative for dizziness, weakness and headaches.  Hematological:  Negative for adenopathy. Does not bruise/bleed easily.  Psychiatric/Behavioral:  Negative for dysphoric mood. The patient is not nervous/anxious.     Patient Active Problem List   Diagnosis Date Noted   Chronic rhinitis 10/08/2022   Primary osteoarthritis of right hip 08/21/2021   Essential hypertension 08/30/2017   Gastroesophageal reflux disease 08/30/2017   Aortic atherosclerosis (HCC) 05/31/2017   PAD (peripheral artery disease) (HCC) 05/31/2017   Encounter for long-term (current) use of high-risk medication 12/28/2016   Seronegative rheumatoid arthritis (HCC) 12/28/2016   Bilateral hand pain 11/08/2016   Elevated C-reactive protein 11/08/2016   Swelling of joint of right hand 11/08/2016   Type 2 diabetes mellitus with microalbuminuria, without long-term current use of insulin (HCC) 07/06/2016   Acute anxiety 04/16/2016   Bronchitis 04/16/2016   Centrilobular emphysema (HCC) 04/16/2016   TIA (transient ischemic attack) 02/29/2016   Lumbar stenosis with neurogenic claudication 04/16/2015   Spondylosis of lumbar region without myelopathy or radiculopathy 04/16/2015   Hyperlipidemia 10/15/2014    Allergies  Allergen Reactions   Rosuvastatin Swelling    Neck swelling   Sulfa Antibiotics Itching  Betadine [Povidone Iodine] Swelling   Codeine Itching   Cyclobenzaprine     Other reaction(s): Dizziness   Latex Itching and Swelling    (urinary catheter)   Losartan     intolerance   Meloxicam Swelling   Penicillins Other (See Comments)    Paralysis  Has patient had a PCN reaction causing immediate rash,  facial/tongue/throat swelling, SOB or lightheadedness with hypotension: No Has patient had a PCN reaction causing severe rash involving mucus membranes or skin necrosis: No Has patient had a PCN reaction that required hospitalization Yes Has patient had a PCN reaction occurring within the last 10 years: No If all of the above answers are "NO", then may proceed with Cephalosporin use.   Valsartan     Past Surgical History:  Procedure Laterality Date   ABDOMINAL HYSTERECTOMY     ANTERIOR (CYSTOCELE) AND POSTERIOR REPAIR (RECTOCELE) WITH XENFORM GRAFT AND SACROSPINOUS FIXATION     APPENDECTOMY     BROW LIFT Bilateral 09/05/2018   Procedure: BLEPHAROPLASTY UPPER EYELID W/EXCESS SKIN;  Surgeon: Imagene Riches, MD;  Location: [facility];  Service: Ophthalmology;  Laterality: Bilateral;   CATARACT EXTRACTION W/PHACO Left 01/06/2022   Procedure: CATARACT EXTRACTION PHACO AND INTRAOCULAR LENS PLACEMENT (IOC) LEFT DIABETIC 8.09 01:07.3;  Surgeon: Lockie Mola, MD;  Location: [facility];  Service: Ophthalmology;  Laterality: Left;   CATARACT EXTRACTION W/PHACO Right 01/20/2022   Procedure: CATARACT EXTRACTION PHACO AND INTRAOCULAR LENS PLACEMENT (IOC) RIGHT DIABETIC;  Surgeon: Lockie Mola, MD;  Location: [facility];  Service: Ophthalmology;  Laterality: Right;  6.03 01:02.2   CTR Bilateral    LUMBAR LAMINECTOMY/DECOMPRESSION MICRODISCECTOMY N/A 07/27/2017   Procedure: LUMBAR LAMINECTOMY/DECOMPRESSION MICRODISCECTOMY 3 LEVELS-L3-S1;  Surgeon: Venetia Night, MD;  Location: [facility];  Service: Neurosurgery;  Laterality: N/A;   PTOSIS REPAIR Bilateral 09/05/2018   Procedure: PTOSIS REPAIR RESECT EX;  Surgeon: Imagene Riches, MD;  Location: [facility];  Service: Ophthalmology;  Laterality: Bilateral;  Diabetic - oral meds sleep apnea Latexd allergy   ROTATOR CUFF REPAIR Right     Social History   Tobacco Use   Smoking status: Former    Current  packs/day: 0.00    Average packs/day: 1.5 packs/day for 28.0 years (42.0 ttl pk-yrs)    Types: Cigarettes    Start date: 08/14/1972    Quit date: 08/14/2000    Years since quitting: 23.1   Smokeless tobacco: Never   Tobacco comments:    smoking cessatiuon materials not required  Vaping Use   Vaping status: Never Used  Substance Use Topics   Alcohol use: No   Drug use: No     Medication list has been reviewed and updated.  Current Meds  Medication Sig   albuterol (PROVENTIL HFA;VENTOLIN HFA) 108 (90 Base) MCG/ACT inhaler Inhale 2 puffs into the lungs every 6 (six) hours as needed for wheezing or shortness of breath.   albuterol (PROVENTIL) (2.5 MG/3ML) 0.083% nebulizer solution Take 3 mLs (2.5 mg total) by nebulization every 6 (six) hours as needed for wheezing or shortness of breath.   ALPRAZolam (XANAX) 0.25 MG tablet Take 1 tablet (0.25 mg total) by mouth 2 (two) times daily as needed for anxiety.   amLODipine (NORVASC) 5 MG tablet Take 1 tablet (5 mg total) by mouth daily.   atorvastatin (LIPITOR) 40 MG tablet Take 1 tablet (40 mg total) by mouth daily.   celecoxib (CELEBREX) 200 MG capsule Take 1 capsule by mouth daily.   cholecalciferol (VITAMIN D3) 25  MCG (1000 UNIT) tablet Take 1,000 Units by mouth daily.   FASENRA 30 MG/ML SOSY Once every 2 months for asthma. Paid for by [employer]   gabapentin (NEURONTIN) 300 MG capsule Take 1 capsule by mouth daily.   gemfibrozil (LOPID) 600 MG tablet Take 1 tablet (600 mg total) by mouth 2 (two) times daily.   guaiFENesin-codeine (ROBITUSSIN AC) 100-10 MG/5ML syrup Take 10 mLs by mouth 4 (four) times daily as needed for cough. Use only for severe cough. Take with FOOD and plenty of WATER.   hydrochlorothiazide (HYDRODIURIL) 25 MG tablet Take 1 tablet (25 mg total) by mouth daily.   ipratropium (ATROVENT) 0.02 % nebulizer solution Inhale into the lungs.   metFORMIN (GLUCOPHAGE) 500 MG tablet Take 500 mg by mouth 2 (two) times daily.    montelukast (SINGULAIR) 10 MG tablet Take 1 tablet (10 mg total) by mouth daily.   pantoprazole (PROTONIX) 40 MG tablet Take 1 tablet (40 mg total) by mouth daily.   vitamin B-12 (CYANOCOBALAMIN) 1000 MCG tablet Take 1,000 mcg by mouth daily.       09/15/2023   10:19 AM 08/19/2023    3:57 PM 08/11/2023    3:50 PM 05/19/2023    2:27 PM  GAD 7 : Generalized Anxiety Score  Nervous, Anxious, on Edge 0 0 0 0  Control/stop worrying 0 0 0 0  Worry too much - different things 0 0 0 0  Trouble relaxing 0 0 0 0  Restless 0 0 0 0  Easily annoyed or irritable 0 0 0 0  Afraid - awful might happen 0 0 0 0  Total GAD 7 Score 0 0 0 0  Anxiety Difficulty Not difficult at all Not difficult at all Not difficult at all Not difficult at all       09/15/2023   10:19 AM 08/19/2023    3:57 PM 08/11/2023    3:49 PM  Depression screen PHQ 2/9  Decreased Interest 0 0 0  Down, Depressed, Hopeless 0 0 0  PHQ - 2 Score 0 0 0  Altered sleeping 3 0 0  Tired, decreased energy 1 0 0  Change in appetite 1 0 0  Feeling bad or failure about yourself   0 0  Trouble concentrating 1 0 0  Moving slowly or fidgety/restless 0 0 0  Suicidal thoughts 0 0 0  PHQ-9 Score 6 0 0  Difficult doing work/chores Not difficult at all Not difficult at all Not difficult at all    BP Readings from Last 3 Encounters:  09/15/23 124/70  08/19/23 122/66  08/11/23 128/78    Physical Exam Vitals and nursing note reviewed. Exam conducted with a chaperone present.  Constitutional:      General: She is not in acute distress.    Appearance: She is not diaphoretic.  HENT:     Head: Normocephalic and atraumatic.     Right Ear: Tympanic membrane and external ear normal.     Left Ear: Tympanic membrane and external ear normal.     Nose: Nose normal. No congestion or rhinorrhea.     Mouth/Throat:     Mouth: Mucous membranes are moist.  Eyes:     General:        Right eye: No discharge.        Left eye: No discharge.      Conjunctiva/sclera: Conjunctivae normal.     Pupils: Pupils are equal, round, and reactive to light.  Neck:     Thyroid: No  thyromegaly.     Vascular: No JVD.  Cardiovascular:     Rate and Rhythm: Normal rate and regular rhythm.     Heart sounds: Normal heart sounds. No murmur heard.    No friction rub. No gallop.  Pulmonary:     Effort: Pulmonary effort is normal.     Breath sounds: Normal breath sounds. No wheezing, rhonchi or rales.  Abdominal:     General: Bowel sounds are normal.     Palpations: Abdomen is soft. There is no mass.     Tenderness: There is no abdominal tenderness. There is no guarding.  Musculoskeletal:        General: Normal range of motion.     Cervical back: Normal range of motion and neck supple.  Lymphadenopathy:     Cervical: No cervical adenopathy.  Skin:    General: Skin is warm and dry.  Neurological:     Mental Status: She is alert.     Deep Tendon Reflexes: Reflexes are normal and symmetric.     Wt Readings from Last 3 Encounters:  09/15/23 127 lb (57.6 kg)  08/19/23 127 lb (57.6 kg)  08/11/23 129 lb (58.5 kg)    BP 124/70   Pulse 73   Ht 5\' 2"  (1.575 m)   Wt 127 lb (57.6 kg)   SpO2 96%   BMI 23.23 kg/m   Assessment and Plan: 1. Cystitis Chronic.  Controlled.  Stable.  Symptomatic with dysuria frequency urgency.  Patient has tenderness over suprapubic but no CVA tenderness.  Exam and history consistent with cystitis and will treat with Macrobid 100 mg twice a day.  Will send urine culture for verification of sensitivities and will recheck patient on an as-needed basis. - POCT Urinalysis Dipstick - Urine Culture - nitrofurantoin, macrocrystal-monohydrate, (MACROBID) 100 MG capsule; Take 1 capsule (100 mg total) by mouth 2 (two) times daily for 7 days.  Dispense: 14 capsule; Refill: 0     Elizabeth Sauer, MD

## 2023-09-15 NOTE — Telephone Encounter (Signed)
Reason for Disposition  Age > 50 years  Answer Assessment - Initial Assessment Questions 1. SEVERITY: "How bad is the pain?"  (e.g., Scale 1-10; mild, moderate, or severe)   - MILD (1-3): complains slightly about urination hurting   - MODERATE (4-7): interferes with normal activities     - SEVERE (8-10): excruciating, unwilling or unable to urinate because of the pain      Moderate   I'm burning with urination.   Abd pain, no flank pain.   2. FREQUENCY: "How many times have you had painful urination today?"      Every time 3. PATTERN: "Is pain present every time you urinate or just sometimes?"      Yes   I really need to go but it hurts to pee.    I'm taking AZO. 4. ONSET: "When did the painful urination start?"      Started Tues. Morning  5. FEVER: "Do you have a fever?" If Yes, ask: "What is your temperature, how was it measured, and when did it start?"     No 6. PAST UTI: "Have you had a urine infection before?" If Yes, ask: "When was the last time?" and "What happened that time?"      Yes many times 7. CAUSE: "What do you think is causing the painful urination?"  (e.g., UTI, scratch, Herpes sore)     UTI 8. OTHER SYMPTOMS: "Do you have any other symptoms?" (e.g., blood in urine, flank pain, genital sores, urgency, vaginal discharge)     Abd pain 9. PREGNANCY: "Is there any chance you are pregnant?" "When was your last menstrual period?"     N/A  Protocols used: Urination Pain - Female-A-AH

## 2023-09-18 LAB — URINE CULTURE

## 2023-09-19 ENCOUNTER — Encounter: Payer: Self-pay | Admitting: Family Medicine

## 2023-09-22 ENCOUNTER — Ambulatory Visit: Payer: Self-pay | Admitting: *Deleted

## 2023-09-22 NOTE — Telephone Encounter (Signed)
Message from Judy Bolton sent at 09/22/2023  9:37 AM EDT  Summary: leg cramps   Pt has been having severe leg cramps at night, and sometimes during the day.  [place] were so bad she was in tears. She went to the pharmacy this past Monday at the [facility] and the pharmacist advised her to stop the gemfibrozil (LOPID) 600 MG tablet b/c it counteracts w/ the atorvastatin (LIPITOR) 40 MG tablet. She stopped it on Monday.  But she needs to let Dr Yetta Barre know and get her opinion. Pt has still been having the leg cramps.  But the pharmacist told her it was a build up.  Pt would like a call back.          Call History  Contact Date/Time Type Contact Phone/Fax User  09/22/2023 09:31 AM EDT Phone (Incoming) Elysia, Grand (Self) [phone] [name]) Crist Infante   Reason for Disposition  Leg pain or muscle cramp is a chronic symptom (recurrent or ongoing AND present > 4 weeks)    Having severe cramps in both legs from an interaction between the Lopid and Lipitor per pharmacist.  Answer Assessment - Initial Assessment Questions 1. ONSET: "When did the pain start?"      The cramps in my legs are so severe at night I can't sleep.   I don't feel good because I can't sleep. The cramps were happening once or twice a week  for the last 2-3 months.   The past 2 weeks it's every night and sometimes during the day and they are bad. See pt's message.   The pharmacist at the [facility] told her the Lopid 600 mg and Lipitor 40 mg were interacting with each other and that's why she is having severe cramps.   Pt stopped the Lopid 600 mg on Monday.   Still having the leg cramps.   The pharmacist told her it was a build up. 2. LOCATION: "Where is the pain located?"      Both my legs are cramping. 3. PAIN: "How bad is the pain?"    (Scale 1-10; or mild, moderate, severe)   -  MILD (1-3): doesn't interfere with normal activities    -  MODERATE (4-7): interferes with normal activities (e.g., work or  school) or awakens from sleep, limping    -  SEVERE (8-10): excruciating pain, unable to do any normal activities, unable to walk     Severe during the night for the last 2 weeks. 4. WORK OR EXERCISE: "Has there been any recent work or exercise that involved this part of the body?"      Interaction between these medications 5. CAUSE: "What do you think is causing the leg pain?"     Pharmacist told me an interaction between the Lopid and Lipitor. 6. OTHER SYMPTOMS: "Do you have any other symptoms?" (e.g., chest pain, back pain, breathing difficulty, swelling, rash, fever, numbness, weakness)     No   Just can't sleep and I'm tired due to the legs cramping. 7. PREGNANCY: "Is there any chance you are pregnant?" "When was your last menstrual period?"     N/A due to age  Protocols used: Leg Pain-A-AH

## 2023-09-22 NOTE — Telephone Encounter (Signed)
  Chief Complaint: Severe cramps in both legs at night for the last 2 weeks. Symptoms: Been having cramps in both legs for the last 2-3 months but the last 2 weeks has gotten much worse. Frequency: The last 2 weeks the cramps are severe in her legs.   Can't sleep. Pertinent Negatives: Patient denies N/A Disposition: [] ED /[] Urgent Care (no appt availability in office) / [] Appointment(In office/virtual)/ []  [place] [facility]/ [] Home Care/ [] Refused Recommended Disposition /[] [facility]/ [x]  Follow-up with PCP Additional Notes: Pharmacist (see pt's message that was sent in) told her she is having an interaction between the Lopid 600 mg and Lipitor 40 mg.   Pt. Stopped the Lopid on Monday.  She is seeking advice.

## 2023-09-22 NOTE — Telephone Encounter (Signed)
Called pt let her know that it is ok to stop lopid and continue to take lipitor. Told pt we will recheck cholesterol in 6 weeks. Told pt to call us if the leg cramps do not go away. Pt verbalized understanding.  KP

## 2023-09-27 ENCOUNTER — Other Ambulatory Visit: Payer: Self-pay | Admitting: Nephrology

## 2023-09-27 DIAGNOSIS — N1832 Chronic kidney disease, stage 3b: Secondary | ICD-10-CM

## 2023-09-27 DIAGNOSIS — I10 Essential (primary) hypertension: Secondary | ICD-10-CM | POA: Diagnosis not present

## 2023-09-27 DIAGNOSIS — E1122 Type 2 diabetes mellitus with diabetic chronic kidney disease: Secondary | ICD-10-CM | POA: Diagnosis not present

## 2023-10-03 ENCOUNTER — Ambulatory Visit
Admission: RE | Admit: 2023-10-03 | Discharge: 2023-10-03 | Disposition: A | Discharge status: Home / Self Care | Payer: Medicare HMO | Source: Ambulatory Visit | Attending: Nephrology | Admitting: Nephrology

## 2023-10-03 DIAGNOSIS — N1832 Chronic kidney disease, stage 3b: Secondary | ICD-10-CM | POA: Diagnosis not present

## 2023-10-07 ENCOUNTER — Other Ambulatory Visit: Payer: Self-pay | Admitting: Family Medicine

## 2023-10-07 DIAGNOSIS — J4521 Mild intermittent asthma with (acute) exacerbation: Secondary | ICD-10-CM

## 2023-10-07 NOTE — Telephone Encounter (Signed)
Requested medication (s) are due for refill today: no  Requested medication (s) are on the active medication list: no  Last refill:  04/05/23  Future visit scheduled: yes  Notes to clinic:  Unable to refill/refuse per protocol, cannot delegate.      Requested Prescriptions  Pending Prescriptions Disp Refills   predniSONE (DELTASONE) 10 MG tablet [Pharmacy Med Name: PREDNISONE 10 MG TABLET] 30 tablet 0    Sig: Take 1 tablet (10 mg total) by mouth daily with breakfast. Start 2 a day for one week/then one a day     Not Delegated - Endocrinology:  Oral Corticosteroids Failed - 10/07/2023 11:34 AM      Failed - This refill cannot be delegated      Failed - Manual Review: Eye exam for IOP if prolonged treatment      Failed - Glucose (serum) in normal range and within 180 days    Glucose  Date Value Ref Range Status  05/19/2023 166 (H) 70 - 99 mg/dL Final  16/09/9603 540 (H) 65 - 99 mg/dL Final   Glucose, Bld  Date Value Ref Range Status  09/21/2022 132 (H) 70 - 99 mg/dL Final    Comment:    Glucose reference range applies only to samples taken after fasting for at least 8 hours.   Glucose-Capillary  Date Value Ref Range Status  01/20/2022 102 (H) 70 - 99 mg/dL Final    Comment:    Glucose reference range applies only to samples taken after fasting for at least 8 hours.         Passed - K in normal range and within 180 days    Potassium  Date Value Ref Range Status  05/19/2023 4.4 3.5 - 5.2 mmol/L Final  08/08/2014 4.7 3.5 - 5.1 mmol/L Final         Passed - Na in normal range and within 180 days    Sodium  Date Value Ref Range Status  05/19/2023 140 134 - 144 mmol/L Final  02/29/2012 142 136 - 145 mmol/L Final         Passed - Last BP in normal range    BP Readings from Last 1 Encounters:  09/15/23 124/70         Passed - Valid encounter within last 6 months    Recent Outpatient Visits           3 weeks ago Cystitis   [place] [facility]   1 month ago COPD with acute bronchitis [facility])   [place] [facility] at [facility], Melton Alar, PA   1 month ago Acute maxillary sinusitis, recurrence not specified   [place] [facility]   4 months ago Essential hypertension   [place] [facility]   6 months ago Acute maxillary sinusitis, recurrence not specified   [place] [facility], [facility]       Future Appointments             In 3 weeks Duanne Limerick, MD [facility] at [facility]            Passed - Bone Mineral Density or Dexa Scan completed in the last 2 years

## 2023-10-13 ENCOUNTER — Other Ambulatory Visit: Payer: Self-pay | Admitting: Family Medicine

## 2023-10-14 ENCOUNTER — Ambulatory Visit (INDEPENDENT_AMBULATORY_CARE_PROVIDER_SITE_OTHER): Payer: Medicare HMO

## 2023-10-14 DIAGNOSIS — Z23 Encounter for immunization: Secondary | ICD-10-CM | POA: Diagnosis not present

## 2023-10-23 ENCOUNTER — Other Ambulatory Visit: Payer: Self-pay | Admitting: Family Medicine

## 2023-10-23 DIAGNOSIS — R1011 Right upper quadrant pain: Secondary | ICD-10-CM

## 2023-10-23 DIAGNOSIS — E785 Hyperlipidemia, unspecified: Secondary | ICD-10-CM

## 2023-10-23 DIAGNOSIS — J4521 Mild intermittent asthma with (acute) exacerbation: Secondary | ICD-10-CM

## 2023-10-25 ENCOUNTER — Encounter: Payer: Self-pay | Admitting: *Deleted

## 2023-10-28 ENCOUNTER — Ambulatory Visit: Payer: Medicare HMO | Admitting: Family Medicine

## 2023-10-31 DIAGNOSIS — R93421 Abnormal radiologic findings on diagnostic imaging of right kidney: Secondary | ICD-10-CM | POA: Diagnosis not present

## 2023-10-31 DIAGNOSIS — I10 Essential (primary) hypertension: Secondary | ICD-10-CM | POA: Diagnosis not present

## 2023-10-31 DIAGNOSIS — D179 Benign lipomatous neoplasm, unspecified: Secondary | ICD-10-CM | POA: Diagnosis not present

## 2023-10-31 DIAGNOSIS — N1832 Chronic kidney disease, stage 3b: Secondary | ICD-10-CM | POA: Diagnosis not present

## 2023-10-31 DIAGNOSIS — E1122 Type 2 diabetes mellitus with diabetic chronic kidney disease: Secondary | ICD-10-CM | POA: Diagnosis not present

## 2023-11-09 ENCOUNTER — Other Ambulatory Visit: Payer: Self-pay | Admitting: Nurse Practitioner

## 2023-11-09 DIAGNOSIS — D179 Benign lipomatous neoplasm, unspecified: Secondary | ICD-10-CM

## 2023-11-09 DIAGNOSIS — N1832 Chronic kidney disease, stage 3b: Secondary | ICD-10-CM

## 2023-11-09 DIAGNOSIS — O283 Abnormal ultrasonic finding on antenatal screening of mother: Secondary | ICD-10-CM

## 2023-11-11 ENCOUNTER — Encounter: Payer: Self-pay | Admitting: *Deleted

## 2023-11-14 ENCOUNTER — Ambulatory Visit
Admission: RE | Admit: 2023-11-14 | Discharge: 2023-11-14 | Disposition: A | Discharge status: Home / Self Care | Payer: Medicare HMO | Source: Ambulatory Visit | Attending: Gastroenterology | Admitting: Gastroenterology

## 2023-11-14 ENCOUNTER — Ambulatory Visit: Payer: Medicare HMO | Admitting: Anesthesiology

## 2023-11-14 ENCOUNTER — Encounter: Payer: Self-pay | Admitting: *Deleted

## 2023-11-14 ENCOUNTER — Encounter
Admission: RE | Disposition: A | Discharge status: Home / Self Care | Payer: Self-pay | Source: Ambulatory Visit | Attending: Gastroenterology

## 2023-11-14 DIAGNOSIS — G4730 Sleep apnea, unspecified: Secondary | ICD-10-CM | POA: Diagnosis not present

## 2023-11-14 DIAGNOSIS — D122 Benign neoplasm of ascending colon: Secondary | ICD-10-CM | POA: Diagnosis not present

## 2023-11-14 DIAGNOSIS — J449 Chronic obstructive pulmonary disease, unspecified: Secondary | ICD-10-CM | POA: Diagnosis not present

## 2023-11-14 DIAGNOSIS — Z1211 Encounter for screening for malignant neoplasm of colon: Secondary | ICD-10-CM | POA: Diagnosis not present

## 2023-11-14 DIAGNOSIS — E119 Type 2 diabetes mellitus without complications: Secondary | ICD-10-CM | POA: Diagnosis not present

## 2023-11-14 DIAGNOSIS — K219 Gastro-esophageal reflux disease without esophagitis: Secondary | ICD-10-CM | POA: Diagnosis not present

## 2023-11-14 DIAGNOSIS — K649 Unspecified hemorrhoids: Secondary | ICD-10-CM | POA: Diagnosis not present

## 2023-11-14 DIAGNOSIS — Z87891 Personal history of nicotine dependence: Secondary | ICD-10-CM | POA: Diagnosis not present

## 2023-11-14 DIAGNOSIS — K5730 Diverticulosis of large intestine without perforation or abscess without bleeding: Secondary | ICD-10-CM | POA: Insufficient documentation

## 2023-11-14 DIAGNOSIS — I10 Essential (primary) hypertension: Secondary | ICD-10-CM | POA: Insufficient documentation

## 2023-11-14 DIAGNOSIS — K640 First degree hemorrhoids: Secondary | ICD-10-CM | POA: Insufficient documentation

## 2023-11-14 DIAGNOSIS — Z860101 Personal history of adenomatous and serrated colon polyps: Secondary | ICD-10-CM | POA: Diagnosis not present

## 2023-11-14 DIAGNOSIS — K635 Polyp of colon: Secondary | ICD-10-CM | POA: Diagnosis not present

## 2023-11-14 HISTORY — PX: POLYPECTOMY: SHX5525

## 2023-11-14 HISTORY — PX: COLONOSCOPY WITH PROPOFOL: SHX5780

## 2023-11-14 SURGERY — COLONOSCOPY WITH PROPOFOL
Anesthesia: General

## 2023-11-14 MED ORDER — LIDOCAINE HCL (CARDIAC) PF 100 MG/5ML IV SOSY
PREFILLED_SYRINGE | INTRAVENOUS | Status: DC | PRN
  Administered 2023-11-14: 60 mg via INTRAVENOUS

## 2023-11-14 MED ORDER — PROPOFOL 10 MG/ML IV BOLUS
INTRAVENOUS | Status: DC | PRN
  Administered 2023-11-14: 20 mg via INTRAVENOUS
  Administered 2023-11-14: 40 mg via INTRAVENOUS

## 2023-11-14 MED ORDER — SODIUM CHLORIDE 0.9 % IV SOLN
INTRAVENOUS | Status: DC
Start: 2023-11-14 — End: 2023-11-14

## 2023-11-14 MED ORDER — PROPOFOL 500 MG/50ML IV EMUL
INTRAVENOUS | Status: DC | PRN
Start: 2023-11-14 — End: 2023-11-14
  Administered 2023-11-14: 110 ug/kg/min via INTRAVENOUS

## 2023-11-14 MED ORDER — PROPOFOL 10 MG/ML IV BOLUS
INTRAVENOUS | Status: AC
  Filled 2023-11-14: qty 20

## 2023-11-14 NOTE — Op Note (Signed)
[facility] Patient Name: Judy Bolton Age Procedure Date: 11/14/2023 10:57 AM MRN: [id number] Account #: [id number] Date of Birth: 1946-07-06 Admit Type: Outpatient Age: 77 Room: [facility] ENDO ROOM 3 Gender: Female Note Status: Finalized Instrument Name: Nelda Marseille [id number] Procedure:             Colonoscopy Indications:           Surveillance: Personal history of adenomatous polyps                         on last colonoscopy > 5 years ago Providers:             Eather Colas MD, MD Referring MD:          Duanne Limerick, MD (Referring MD) Medicines:             Monitored Anesthesia Care Complications:         No immediate complications. Estimated blood loss:                         Minimal. Procedure:             Pre-Anesthesia Assessment:                        - Prior to the procedure, a History and Physical was                         performed, and patient medications and allergies were                         reviewed. The patient is competent. The risks and                         benefits of the procedure and the sedation options and                         risks were discussed with the patient. All questions                         were answered and informed consent was obtained.                         Patient identification and proposed procedure were                         verified by the physician, the nurse, the                         anesthesiologist, the anesthetist and the technician                         in the endoscopy suite. Mental Status Examination:                         alert and oriented. Airway Examination: normal                         oropharyngeal airway and neck mobility. Respiratory  Examination: clear to auscultation. CV Examination:                         normal. Prophylactic Antibiotics: The patient does not                         require prophylactic antibiotics. Prior                          Anticoagulants: The patient has taken no anticoagulant                         or antiplatelet agents. ASA Grade Assessment: III - A                         patient with severe systemic disease. After reviewing                         the risks and benefits, the patient was deemed in                         satisfactory condition to undergo the procedure. The                         anesthesia plan was to use monitored anesthesia care                         (MAC). Immediately prior to administration of                         medications, the patient was re-assessed for adequacy                         to receive sedatives. The heart rate, respiratory                         rate, oxygen saturations, blood pressure, adequacy of                         pulmonary ventilation, and response to care were                         monitored throughout the procedure. The physical                         status of the patient was re-assessed after the                         procedure.                        After obtaining informed consent, the colonoscope was                         passed under direct vision. Throughout the procedure,                         the patient's blood pressure, pulse, and oxygen  saturations were monitored continuously. The                         Colonoscope was introduced through the anus and                         advanced to the the cecum, identified by appendiceal                         orifice and ileocecal valve. The colonoscopy was                         performed without difficulty. The patient tolerated                         the procedure well. The quality of the bowel                         preparation was good. The ileocecal valve, appendiceal                         orifice, and rectum were photographed. Findings:      The perianal and digital rectal examinations were normal.      Multiple small-mouthed diverticula  were found in the sigmoid colon,       descending colon, transverse colon, hepatic flexure and ascending colon.      A 3 mm polyp was found in the ascending colon. The polyp was sessile.       The polyp was removed with a cold snare. Resection and retrieval were       complete. Estimated blood loss was minimal.      Internal hemorrhoids were found during retroflexion. The hemorrhoids       were Grade I (internal hemorrhoids that do not prolapse).      The exam was otherwise without abnormality on direct and retroflexion       views. Impression:            - Diverticulosis in the sigmoid colon, in the                         descending colon, in the transverse colon, at the                         hepatic flexure and in the ascending colon.                        - One 3 mm polyp in the ascending colon, removed with                         a cold snare. Resected and retrieved.                        - Internal hemorrhoids.                        - The examination was otherwise normal on direct and                         retroflexion views. Recommendation:        -  Discharge patient to home.                        - Resume previous diet.                        - Continue present medications.                        - Await pathology results.                        - Repeat colonoscopy is not recommended due to current                         age (32 years or older) for surveillance.                        - Return to referring physician as previously                         scheduled. Procedure Code(s):     --- Professional ---                        [phone], Colonoscopy, flexible; with removal of                         tumor(s), polyp(s), or other lesion(s) by snare                         technique Diagnosis Code(s):     --- Professional ---                        Z86.010, Personal history of colonic polyps                        K64.0, First degree hemorrhoids                         D12.2, Benign neoplasm of ascending colon                        K57.30, Diverticulosis of large intestine without                         perforation or abscess without bleeding CPT copyright 2022 American [employer]. All rights reserved. The codes documented in this report are preliminary and upon coder review may  be revised to meet current compliance requirements. Eather Colas MD, MD 11/14/2023 11:29:21 AM Number of Addenda: 0 Note Initiated On: 11/14/2023 10:57 AM Scope Withdrawal Time: 0 hours 10 minutes 20 seconds  Total Procedure Duration: 0 hours 17 minutes 21 seconds  Estimated Blood Loss:  Estimated blood loss was minimal.      [facility]

## 2023-11-14 NOTE — Interval H&P Note (Signed)
History and Physical Interval Note:  11/14/2023 10:49 AM  Judy Bolton  has presented today for surgery, with the diagnosis of hx pf adenomatous polyp of colon,bowel habit chamges.  The various methods of treatment have been discussed with the patient and family. After consideration of risks, benefits and other options for treatment, the patient has consented to  Procedure(s): COLONOSCOPY WITH PROPOFOL (N/A) as a surgical intervention.  The patient's history has been reviewed, patient examined, no change in status, stable for surgery.  I have reviewed the patient's chart and labs.  Questions were answered to the patient's satisfaction.     Regis Bill  Ok to proceed with colonoscopy

## 2023-11-14 NOTE — Anesthesia Preprocedure Evaluation (Signed)
Anesthesia Evaluation  Patient identified by MRN, date of birth, ID band Patient awake    Reviewed: Allergy & Precautions, NPO status , Patient's Chart, lab work & pertinent test results  History of Anesthesia Complications Negative for: history of anesthetic complications  Airway Mallampati: III  TM Distance: >3 FB Neck ROM: full    Dental no notable dental hx.    Pulmonary asthma , sleep apnea and Continuous Positive Airway Pressure Ventilation , COPD,  COPD inhaler, former smoker   Pulmonary exam normal        Cardiovascular hypertension, On Medications + Peripheral Vascular Disease  Normal cardiovascular exam+ Valvular Problems/Murmurs      Neuro/Psych  PSYCHIATRIC DISORDERS Anxiety     TIACVA    GI/Hepatic Neg liver ROS,GERD  ,,  Endo/Other  negative endocrine ROSdiabetes    Renal/GU negative Renal ROS  negative genitourinary   Musculoskeletal   Abdominal   Peds  Hematology negative hematology ROS (+)   Anesthesia Other Findings Past Medical History: No date: Acid reflux No date: Arthritis No date: Asthma No date: Back pain No date: Back pain No date: COPD (chronic obstructive pulmonary disease) (HCC) No date: Diabetes mellitus without complication (HCC) No date: Hyperlipidemia No date: Hypertension No date: Seizure (HCC)     Comment:  x1 - over 30 yrs ago No date: Sleep apnea     Comment:  No CPAP No date: Stroke [facility])     Comment:  March of 2017 vocabulary  Past Surgical History: No date: ABDOMINAL HYSTERECTOMY No date: ANTERIOR (CYSTOCELE) AND POSTERIOR REPAIR (RECTOCELE) WITH  XENFORM GRAFT AND SACROSPINOUS FIXATION No date: APPENDECTOMY No date: BACK SURGERY 09/05/2018: BROW LIFT; Bilateral     Comment:  Procedure: BLEPHAROPLASTY UPPER EYELID W/EXCESS SKIN;                Surgeon: Imagene Riches, MD;  Location: [facility]               CNTR;  Service: Ophthalmology;  Laterality:  Bilateral; 01/06/2022: CATARACT EXTRACTION W/PHACO; Left     Comment:  Procedure: CATARACT EXTRACTION PHACO AND INTRAOCULAR               LENS PLACEMENT (IOC) LEFT DIABETIC 8.09 01:07.3;                Surgeon: Lockie Mola, MD;  Location: [facility]               SURGERY CNTR;  Service: Ophthalmology;  Laterality: Left; 01/20/2022: CATARACT EXTRACTION W/PHACO; Right     Comment:  Procedure: CATARACT EXTRACTION PHACO AND INTRAOCULAR               LENS PLACEMENT (IOC) RIGHT DIABETIC;  Surgeon:               Lockie Mola, MD;  Location: [facility];              Service: Ophthalmology;  Laterality: Right;                6.03 01:02.2 No date: CTR; Bilateral No date: EYE SURGERY 07/27/2017: LUMBAR LAMINECTOMY/DECOMPRESSION MICRODISCECTOMY; N/A     Comment:  Procedure: LUMBAR LAMINECTOMY/DECOMPRESSION               MICRODISCECTOMY 3 LEVELS-L3-S1;  Surgeon: Venetia Night, MD;  Location: [facility] ORS;  Service: Neurosurgery;  Laterality: N/A; 09/05/2018: PTOSIS REPAIR; Bilateral     Comment:  Procedure: PTOSIS REPAIR RESECT EX;  Surgeon: Imagene Riches, MD;  Location: [facility] SURG[facility] CNTR;  Service:               Ophthalmology;  Laterality: Bilateral;  Diabetic - oral               meds sleep apnea Latexd allergy No date: ROTATOR CUFF REPAIR; Right     Reproductive/Obstetrics negative OB ROS                              Anesthesia Physical Anesthesia Plan  ASA: 3  Anesthesia Plan: General   Post-op Pain Management: Minimal or no pain anticipated   Induction: Intravenous  PONV Risk Score and Plan: 2 and Propofol infusion and TIVA  Airway Management Planned: Natural Airway and Nasal Cannula  Additional Equipment:   Intra-op Plan:   Post-operative Plan:   Informed Consent: I have reviewed the patients History and Physical, chart, labs and discussed the procedure including the risks,  benefits and alternatives for the proposed anesthesia with the patient or authorized representative who has indicated his/her understanding and acceptance.     Dental Advisory Given  Plan Discussed with: Anesthesiologist, CRNA and Surgeon  Anesthesia Plan Comments: (Patient consented for risks of anesthesia including but not limited to:  - adverse reactions to medications - risk of airway placement if required - damage to eyes, teeth, lips or other oral mucosa - nerve damage due to positioning  - sore throat or hoarseness - Damage to heart, brain, nerves, lungs, other parts of body or loss of life  Patient voiced understanding and assent.)         Anesthesia Quick Evaluation

## 2023-11-14 NOTE — H&P (Signed)
Outpatient short stay form Pre-procedure 11/14/2023  Regis Bill, MD  Primary Physician: Duanne Limerick, MD  Reason for visit:  Surveillance  History of present illness:    77 y/o lady with history of hypertension, DM II, and IBS-C here for surveillance colonoscopy. Last colonoscopy was 9 years ago. No blood thinners. No first degree relatives with GI malignancies. History of hysterectomy and appendectomy.    Current Facility-Administered Medications:    0.9 %  sodium chloride infusion, , Intravenous, Continuous, Tannisha Kennington, Rossie Muskrat, MD  Medications Prior to Admission  Medication Sig Dispense Refill Last Dose   amLODipine (NORVASC) 5 MG tablet TAKE 1 TABLET EVERY DAY 90 tablet 3 11/13/2023   atorvastatin (LIPITOR) 40 MG tablet TAKE 1 TABLET EVERY DAY 90 tablet 1 11/13/2023   Dulaglutide 1.5 MG/0.5ML SOAJ Inject 1.5 mg into the skin once a week.   11/06/2023   gabapentin (NEURONTIN) 300 MG capsule Take 1 capsule by mouth daily.   11/13/2023   hydrochlorothiazide (HYDRODIURIL) 25 MG tablet Take 1 tablet (25 mg total) by mouth daily. 90 tablet 1 11/13/2023   montelukast (SINGULAIR) 10 MG tablet Take 1 tablet (10 mg total) by mouth daily. 90 tablet 1 11/13/2023   pantoprazole (PROTONIX) 40 MG tablet TAKE 1 TABLET EVERY DAY 90 tablet 0 11/13/2023   zinc gluconate 50 MG tablet Take 50 mg by mouth daily.      albuterol (PROVENTIL HFA;VENTOLIN HFA) 108 (90 Base) MCG/ACT inhaler Inhale 2 puffs into the lungs every 6 (six) hours as needed for wheezing or shortness of breath.      albuterol (PROVENTIL) (2.5 MG/3ML) 0.083% nebulizer solution Take 3 mLs (2.5 mg total) by nebulization every 6 (six) hours as needed for wheezing or shortness of breath. 75 mL 12    ALPRAZolam (XANAX) 0.25 MG tablet Take 1 tablet (0.25 mg total) by mouth 2 (two) times daily as needed for anxiety. 20 tablet 0    celecoxib (CELEBREX) 200 MG capsule Take 1 capsule by mouth daily. (Patient not taking: Reported on 11/14/2023)    Not Taking   cholecalciferol (VITAMIN D3) 25 MCG (1000 UNIT) tablet Take 1,000 Units by mouth daily.      FASENRA 30 MG/ML SOSY Once every 2 months for asthma. Paid for by [employer]      gemfibrozil (LOPID) 600 MG tablet Take 1 tablet (600 mg total) by mouth 2 (two) times daily. (Patient not taking: Reported on 11/14/2023) 180 tablet 1 Not Taking   guaiFENesin-codeine (ROBITUSSIN AC) 100-10 MG/5ML syrup Take 10 mLs by mouth 4 (four) times daily as needed for cough. Use only for severe cough. Take with FOOD and plenty of WATER. 118 mL 0    ipratropium (ATROVENT) 0.02 % nebulizer solution Inhale into the lungs.      metFORMIN (GLUCOPHAGE) 500 MG tablet Take 500 mg by mouth 2 (two) times daily.      vitamin B-12 (CYANOCOBALAMIN) 1000 MCG tablet Take 1,000 mcg by mouth daily.        Allergies  Allergen Reactions   Rosuvastatin Swelling    Neck swelling   Sulfa Antibiotics Itching   Betadine [Povidone Iodine] Swelling   Codeine Itching   Cyclobenzaprine     Other reaction(s): Dizziness   Latex Itching and Swelling    (urinary catheter)   Losartan     intolerance   Meloxicam Swelling   Penicillins Other (See Comments)    Paralysis  Has patient had a PCN reaction causing immediate rash, facial/tongue/throat swelling, SOB  or lightheadedness with hypotension: No Has patient had a PCN reaction causing severe rash involving mucus membranes or skin necrosis: No Has patient had a PCN reaction that required hospitalization Yes Has patient had a PCN reaction occurring within the last 10 years: No If all of the above answers are "NO", then may proceed with Cephalosporin use.   Valsartan      Past Medical History:  Diagnosis Date   Acid reflux    Arthritis    Asthma    Back pain    Back pain    COPD (chronic obstructive pulmonary disease) (HCC)    Diabetes mellitus without complication (HCC)    Hyperlipidemia    Hypertension    Seizure (HCC)    x1 - over 30 yrs ago   Sleep apnea     No CPAP   Sleep apnea    cat trained to notify oatuebt   Stroke [facility])    March of 2017 vocabulary    Review of systems:  Otherwise negative.    Physical Exam  Gen: Alert, oriented. Appears stated age.  HEENT: PERRLA. Lungs: No respiratory distress CV: RRR Abd: soft, benign, no masses Ext: No edema    Planned procedures: Proceed with colonoscopy. The patient understands the nature of the planned procedure, indications, risks, alternatives and potential complications including but not limited to bleeding, infection, perforation, damage to internal organs and possible oversedation/side effects from anesthesia. The patient agrees and gives consent to proceed.  Please refer to procedure notes for findings, recommendations and patient disposition/instructions.     Regis Bill, MD [facility] Gastroenterology

## 2023-11-14 NOTE — Transfer of Care (Signed)
Immediate Anesthesia Transfer of Care Note  Patient: Judy Bolton  Procedure(s) Performed: COLONOSCOPY WITH PROPOFOL  Patient Location: PACU  Anesthesia Type:General  Level of Consciousness: awake and alert   Airway & Oxygen Therapy: Patient Spontanous Breathing  Post-op Assessment: Report given to RN and Post -op Vital signs reviewed and stable  Post vital signs: Reviewed and stable  Last Vitals: See PACU flow sheet for normal BP and Temp  Pt denies pain Vitals Value Taken Time  BP    Temp    Pulse 71 11/14/23 1129  Resp 16 11/14/23 1129  SpO2 100 % 11/14/23 1129  Vitals shown include unfiled device data.  Last Pain:  Vitals:   11/14/23 1048  TempSrc: Temporal         Complications: No notable events documented.

## 2023-11-14 NOTE — Anesthesia Postprocedure Evaluation (Signed)
Anesthesia Post Note  Patient: DENEE LUNDVALL  Procedure(s) Performed: COLONOSCOPY WITH PROPOFOL  Patient location during evaluation: Endoscopy Anesthesia Type: General Level of consciousness: awake and alert Pain management: pain level controlled Vital Signs Assessment: post-procedure vital signs reviewed and stable Respiratory status: spontaneous breathing, nonlabored ventilation, respiratory function stable and patient connected to nasal cannula oxygen Cardiovascular status: blood pressure returned to baseline and stable Postop Assessment: no apparent nausea or vomiting Anesthetic complications: no   No notable events documented.   Last Vitals:  Vitals:   11/14/23 1139 11/14/23 1150  BP: (!) 178/59 (!) 174/69  Pulse: 66 61  Resp: 17 15  Temp:    SpO2: 100% 100%    Last Pain:  Vitals:   11/14/23 1150  TempSrc:   PainSc: 0-No pain                 Louie Boston

## 2023-11-15 ENCOUNTER — Encounter: Payer: Self-pay | Admitting: Gastroenterology

## 2023-11-15 LAB — SURGICAL PATHOLOGY

## 2023-11-17 ENCOUNTER — Ambulatory Visit: Payer: Medicare HMO | Attending: Nurse Practitioner

## 2023-11-18 ENCOUNTER — Encounter: Payer: Self-pay | Admitting: Family Medicine

## 2023-11-18 ENCOUNTER — Ambulatory Visit (INDEPENDENT_AMBULATORY_CARE_PROVIDER_SITE_OTHER): Payer: Medicare HMO | Admitting: Family Medicine

## 2023-11-18 VITALS — BP 124/79 | HR 67 | Ht 62.0 in | Wt 132.0 lb

## 2023-11-18 DIAGNOSIS — R1011 Right upper quadrant pain: Secondary | ICD-10-CM | POA: Diagnosis not present

## 2023-11-18 DIAGNOSIS — I10 Essential (primary) hypertension: Secondary | ICD-10-CM | POA: Diagnosis not present

## 2023-11-18 DIAGNOSIS — E785 Hyperlipidemia, unspecified: Secondary | ICD-10-CM

## 2023-11-18 MED ORDER — METOPROLOL TARTRATE 100 MG PO TABS: 100.0000 mg | ORAL_TABLET | Freq: Two times a day (BID) | ORAL | 1 refills | Status: DC

## 2023-11-18 MED ORDER — ATORVASTATIN CALCIUM 40 MG PO TABS: 40.0000 mg | ORAL_TABLET | Freq: Every day | ORAL | 1 refills | Status: AC

## 2023-11-18 MED ORDER — AMLODIPINE BESYLATE 5 MG PO TABS: 5.0000 mg | ORAL_TABLET | Freq: Every day | ORAL | 1 refills | Status: DC

## 2023-11-18 MED ORDER — PANTOPRAZOLE SODIUM 40 MG PO TBEC: 40.0000 mg | DELAYED_RELEASE_TABLET | Freq: Every day | ORAL | 1 refills | Status: DC

## 2023-11-18 MED ORDER — HYDROCHLOROTHIAZIDE 25 MG PO TABS: 25.0000 mg | ORAL_TABLET | Freq: Every day | ORAL | 1 refills | Status: DC

## 2023-11-18 NOTE — Progress Notes (Signed)
Date:  11/18/2023   Name:  Judy Bolton   DOB:  Oct 09, 1946   MRN:  [id number]   Chief Complaint: Hypertension and Hyperlipidemia  Hypertension This is a chronic problem. The current episode started more than 1 year ago. The problem has been waxing and waning since onset. The problem is controlled. Pertinent negatives include no anxiety, blurred vision, chest pain, headaches, malaise/fatigue, neck pain, orthopnea, palpitations, peripheral edema, PND, shortness of breath or sweats. There are no associated agents to hypertension. There are no known risk factors for coronary artery disease. Past treatments include calcium channel blockers, diuretics and beta blockers. The current treatment provides moderate improvement. There are no compliance problems.  There is no history of angina, kidney disease, CAD/MI, CVA, heart failure, left ventricular hypertrophy, PVD or retinopathy. There is no history of chronic renal disease, a hypertension causing med or renovascular disease.  Hyperlipidemia This is a chronic problem. The current episode started more than 1 year ago. The problem is controlled. She has no history of chronic renal disease. Pertinent negatives include no chest pain, focal sensory loss, focal weakness, leg pain, myalgias or shortness of breath. Current antihyperlipidemic treatment includes statins. The current treatment provides moderate improvement of lipids. There are no compliance problems.   Gastroesophageal Reflux She reports no abdominal pain, no chest pain, no coughing, no dysphagia, no heartburn, no hoarse voice, no stridor, no water brash or no wheezing. This is a chronic problem. The current episode started more than 1 year ago. The symptoms are aggravated by certain foods. Pertinent negatives include no anemia, fatigue, melena, muscle weakness, orthopnea or weight loss. She has tried a PPI for the symptoms. The treatment provided moderate relief.    Lab Results  Component  Value Date   NA 140 05/19/2023   K 4.4 05/19/2023   CO2 21 05/19/2023   GLUCOSE 166 (H) 05/19/2023   BUN 30 (H) 05/19/2023   CREATININE 1.43 (H) 05/19/2023   CALCIUM 8.8 05/19/2023   EGFR 38 (L) 05/19/2023   GFRNONAA 42 (L) 09/21/2022   Lab Results  Component Value Date   CHOL 172 05/04/2022   HDL 64 05/04/2022   LDLCALC 75 05/04/2022   TRIG 203 (H) 05/04/2022   CHOLHDL 2.8 01/24/2018   No results found for: "TSH" Lab Results  Component Value Date   HGBA1C 6.4 08/29/2023   Lab Results  Component Value Date   WBC 9.5 09/21/2022   HGB 14.1 09/21/2022   HCT 43.5 09/21/2022   MCV 86.1 09/21/2022   PLT 260 09/21/2022   Lab Results  Component Value Date   ALT 13 09/20/2022   AST 18 09/20/2022   ALKPHOS 115 09/20/2022   BILITOT 0.7 09/20/2022   No results found for: "25OHVITD2", "25OHVITD3", "VD25OH"   Review of Systems  Constitutional:  Negative for fatigue, malaise/fatigue, unexpected weight change and weight loss.  HENT:  Negative for hoarse voice.   Eyes:  Negative for blurred vision and visual disturbance.  Respiratory:  Negative for cough, chest tightness, shortness of breath and wheezing.   Cardiovascular:  Negative for chest pain, palpitations, orthopnea, leg swelling and PND.  Gastrointestinal:  Negative for abdominal pain, dysphagia, heartburn and melena.  Musculoskeletal:  Negative for myalgias, muscle weakness and neck pain.  Neurological:  Negative for focal weakness and headaches.    Patient Active Problem List   Diagnosis Date Noted   Chronic rhinitis 10/08/2022   Primary osteoarthritis of right hip 08/21/2021   Essential hypertension 08/30/2017  Gastroesophageal reflux disease 08/30/2017   Aortic atherosclerosis (HCC) 05/31/2017   PAD (peripheral artery disease) (HCC) 05/31/2017   Encounter for long-term (current) use of high-risk medication 12/28/2016   Seronegative rheumatoid arthritis (HCC) 12/28/2016   Bilateral hand pain 11/08/2016    Elevated C-reactive protein 11/08/2016   Swelling of joint of right hand 11/08/2016   Type 2 diabetes mellitus with microalbuminuria, without long-term current use of insulin (HCC) 07/06/2016   Acute anxiety 04/16/2016   Bronchitis 04/16/2016   Centrilobular emphysema (HCC) 04/16/2016   TIA (transient ischemic attack) 02/29/2016   Lumbar stenosis with neurogenic claudication 04/16/2015   Spondylosis of lumbar region without myelopathy or radiculopathy 04/16/2015   Hyperlipidemia 10/15/2014    Allergies  Allergen Reactions   Rosuvastatin Swelling    Neck swelling   Sulfa Antibiotics Itching   Betadine [Povidone Iodine] Swelling   Cyclobenzaprine     Other reaction(s): Dizziness   Latex Itching and Swelling    (urinary catheter)   Losartan     intolerance   Meloxicam Swelling   Penicillins Other (See Comments)    Paralysis  Has patient had a PCN reaction causing immediate rash, facial/tongue/throat swelling, SOB or lightheadedness with hypotension: No Has patient had a PCN reaction causing severe rash involving mucus membranes or skin necrosis: No Has patient had a PCN reaction that required hospitalization Yes Has patient had a PCN reaction occurring within the last 10 years: No If all of the above answers are "NO", then may proceed with Cephalosporin use.   Valsartan    Codeine Itching and Rash    Past Surgical History:  Procedure Laterality Date   ABDOMINAL HYSTERECTOMY     ANTERIOR (CYSTOCELE) AND POSTERIOR REPAIR (RECTOCELE) WITH XENFORM GRAFT AND SACROSPINOUS FIXATION     APPENDECTOMY     BACK SURGERY     BROW LIFT Bilateral 09/05/2018   Procedure: BLEPHAROPLASTY UPPER EYELID W/EXCESS SKIN;  Surgeon: Imagene Riches, MD;  Location: [facility];  Service: Ophthalmology;  Laterality: Bilateral;   CATARACT EXTRACTION W/PHACO Left 01/06/2022   Procedure: CATARACT EXTRACTION PHACO AND INTRAOCULAR LENS PLACEMENT (IOC) LEFT DIABETIC 8.09 01:07.3;  Surgeon: Lockie Mola, MD;  Location: [facility];  Service: Ophthalmology;  Laterality: Left;   CATARACT EXTRACTION W/PHACO Right 01/20/2022   Procedure: CATARACT EXTRACTION PHACO AND INTRAOCULAR LENS PLACEMENT (IOC) RIGHT DIABETIC;  Surgeon: Lockie Mola, MD;  Location: [facility];  Service: Ophthalmology;  Laterality: Right;  6.03 01:02.2   COLONOSCOPY WITH PROPOFOL N/A 11/14/2023   Procedure: COLONOSCOPY WITH PROPOFOL;  Surgeon: Regis Bill, MD;  Location: [facility];  Service: Endoscopy;  Laterality: N/A;   CTR Bilateral    EYE SURGERY     LUMBAR LAMINECTOMY/DECOMPRESSION MICRODISCECTOMY N/A 07/27/2017   Procedure: LUMBAR LAMINECTOMY/DECOMPRESSION MICRODISCECTOMY 3 LEVELS-L3-S1;  Surgeon: Venetia Night, MD;  Location: [facility];  Service: Neurosurgery;  Laterality: N/A;   POLYPECTOMY  11/14/2023   Procedure: POLYPECTOMY;  Surgeon: Regis Bill, MD;  Location: [facility];  Service: Endoscopy;;   PTOSIS REPAIR Bilateral 09/05/2018   Procedure: PTOSIS REPAIR RESECT EX;  Surgeon: Imagene Riches, MD;  Location: [facility];  Service: Ophthalmology;  Laterality: Bilateral;  Diabetic - oral meds sleep apnea Latexd allergy   ROTATOR CUFF REPAIR Right     Social History   Tobacco Use   Smoking status: Former    Current packs/day: 0.00    Average packs/day: 1.5 packs/day for 28.0 years (42.0 ttl pk-yrs)    Types: Cigarettes  Start date: 08/14/1972    Quit date: 08/14/2000    Years since quitting: 23.2   Smokeless tobacco: Never   Tobacco comments:    smoking cessatiuon materials not required  Vaping Use   Vaping status: Never Used  Substance Use Topics   Alcohol use: No   Drug use: No     Medication list has been reviewed and updated.  Current Meds  Medication Sig   albuterol (PROVENTIL HFA;VENTOLIN HFA) 108 (90 Base) MCG/ACT inhaler Inhale 2 puffs into the lungs every 6 (six) hours as needed for wheezing or shortness of breath.    albuterol (PROVENTIL) (2.5 MG/3ML) 0.083% nebulizer solution Take 3 mLs (2.5 mg total) by nebulization every 6 (six) hours as needed for wheezing or shortness of breath.   amLODipine (NORVASC) 5 MG tablet TAKE 1 TABLET EVERY DAY   atorvastatin (LIPITOR) 40 MG tablet TAKE 1 TABLET EVERY DAY   cholecalciferol (VITAMIN D3) 25 MCG (1000 UNIT) tablet Take 1,000 Units by mouth daily.   Dulaglutide 1.5 MG/0.5ML SOAJ Inject 1.5 mg into the skin once a week.   FASENRA 30 MG/ML SOSY Once every 2 months for asthma. Paid for by [employer]   gabapentin (NEURONTIN) 300 MG capsule Take 1 capsule by mouth daily.   hydrochlorothiazide (HYDRODIURIL) 25 MG tablet Take 1 tablet (25 mg total) by mouth daily.   ipratropium (ATROVENT) 0.02 % nebulizer solution Inhale into the lungs.   metFORMIN (GLUCOPHAGE) 500 MG tablet Take 500 mg by mouth 2 (two) times daily.   metoprolol tartrate (LOPRESSOR) 100 MG tablet Take 100 mg by mouth 2 (two) times daily.   montelukast (SINGULAIR) 10 MG tablet Take 1 tablet (10 mg total) by mouth daily.   pantoprazole (PROTONIX) 40 MG tablet TAKE 1 TABLET EVERY DAY   vitamin B-12 (CYANOCOBALAMIN) 1000 MCG tablet Take 1,000 mcg by mouth daily.   zinc gluconate 50 MG tablet Take 50 mg by mouth daily.       11/18/2023   10:56 AM 09/15/2023   10:19 AM 08/19/2023    3:57 PM 08/11/2023    3:50 PM  GAD 7 : Generalized Anxiety Score  Nervous, Anxious, on Edge 0 0 0 0  Control/stop worrying 0 0 0 0  Worry too much - different things 0 0 0 0  Trouble relaxing 0 0 0 0  Restless 0 0 0 0  Easily annoyed or irritable 0 0 0 0  Afraid - awful might happen 0 0 0 0  Total GAD 7 Score 0 0 0 0  Anxiety Difficulty  Not difficult at all Not difficult at all Not difficult at all       11/18/2023   10:56 AM 09/15/2023   10:19 AM 08/19/2023    3:57 PM  Depression screen PHQ 2/9  Decreased Interest 0 0 0  Down, Depressed, Hopeless 0 0 0  PHQ - 2 Score 0 0 0  Altered sleeping 0 3 0  Tired,  decreased energy 3 1 0  Change in appetite 1 1 0  Feeling bad or failure about yourself  0  0  Trouble concentrating 1 1 0  Moving slowly or fidgety/restless 0 0 0  Suicidal thoughts 0 0 0  PHQ-9 Score 5 6 0  Difficult doing work/chores Not difficult at all Not difficult at all Not difficult at all    BP Readings from Last 3 Encounters:  11/18/23 124/79  11/14/23 (!) 174/69  09/15/23 124/70    Physical Exam Vitals and nursing  note reviewed. Exam conducted with a chaperone present.  Constitutional:      General: She is not in acute distress.    Appearance: She is not diaphoretic.  HENT:     Head: Normocephalic and atraumatic.     Right Ear: Tympanic membrane and external ear normal.     Left Ear: External ear normal.     Nose: Nose normal.     Mouth/Throat:     Mouth: Mucous membranes are moist.  Eyes:     General:        Right eye: No discharge.        Left eye: No discharge.     Conjunctiva/sclera: Conjunctivae normal.     Pupils: Pupils are equal, round, and reactive to light.  Neck:     Thyroid: No thyromegaly.     Vascular: No JVD.  Cardiovascular:     Rate and Rhythm: Normal rate and regular rhythm.     Heart sounds: Normal heart sounds. No murmur heard.    No friction rub. No gallop.  Pulmonary:     Effort: Pulmonary effort is normal.     Breath sounds: Normal breath sounds. No wheezing, rhonchi or rales.  Abdominal:     General: Bowel sounds are normal.     Palpations: Abdomen is soft. There is no mass.     Tenderness: There is no abdominal tenderness. There is no guarding.  Musculoskeletal:        General: Normal range of motion.     Cervical back: Normal range of motion and neck supple.  Lymphadenopathy:     Cervical: No cervical adenopathy.  Skin:    General: Skin is warm and dry.  Neurological:     Mental Status: She is alert.     Deep Tendon Reflexes: Reflexes are normal and symmetric.     Wt Readings from Last 3 Encounters:  11/18/23 132 lb  (59.9 kg)  09/15/23 127 lb (57.6 kg)  08/19/23 127 lb (57.6 kg)    BP 124/79   Pulse 67   Ht 5\' 2"  (1.575 m)   Wt 132 lb (59.9 kg)   SpO2 98%   BMI 24.14 kg/m   Assessment and Plan:  1. Essential hypertension (Primary) Chronic.  Controlled.  Stable.  Blood pressure today is 124/79.  Asymptomatic.  Tolerating medications well.  Continue amlodipine 5 mg once a day, hydrochlorothiazide 25 mg once a day and metoprolol 100 mg 1 twice a day.  Review of labs are within normal limits and is followed by nephrology.  Will recheck in 6 months. - amLODipine (NORVASC) 5 MG tablet; Take 1 tablet (5 mg total) by mouth daily.  Dispense: 90 tablet; Refill: 1 - hydrochlorothiazide (HYDRODIURIL) 25 MG tablet; Take 1 tablet (25 mg total) by mouth daily.  Dispense: 90 tablet; Refill: 1 - metoprolol tartrate (LOPRESSOR) 100 MG tablet; Take 1 tablet (100 mg total) by mouth 2 (two) times daily.  Dispense: 180 tablet; Refill: 1  2. Hyperlipidemia, unspecified hyperlipidemia type Chronic.  Controlled.  Stable.  Asymptomatic.  Stable.  Patient is currently on atorvastatin 40 mg once a day and is not experiencing any myalgias or muscle weakness.  We will check a lipid panel for current status of LDL.  Will recheck patient in 6 months. - atorvastatin (LIPITOR) 40 MG tablet; Take 1 tablet (40 mg total) by mouth daily.  Dispense: 90 tablet; Refill: 1 - Lipid Panel With LDL/HDL Ratio  3. Right upper quadrant pain Right upper quadrant  pain much likely of a gastritis issues she is currently taking pantoprazole 40 mg which has resolved this issue and she would like to continue at current dosing.  Will recheck patient on as-needed basis as well as 6 months. - pantoprazole (PROTONIX) 40 MG tablet; Take 1 tablet (40 mg total) by mouth daily.  Dispense: 90 tablet; Refill: 1    Elizabeth Sauer, MD

## 2023-11-19 ENCOUNTER — Encounter: Payer: Self-pay | Admitting: Family Medicine

## 2023-11-19 ENCOUNTER — Other Ambulatory Visit: Payer: Self-pay | Admitting: Family Medicine

## 2023-11-19 DIAGNOSIS — I10 Essential (primary) hypertension: Secondary | ICD-10-CM

## 2023-11-19 LAB — LIPID PANEL WITH LDL/HDL RATIO
Cholesterol, Total: 174 mg/dL (ref 100–199)
HDL: 93 mg/dL (ref >39)
LDL Chol Calc (NIH): 60 mg/dL (ref 0–99)
LDL/HDL Ratio: 0.6 {ratio} (ref 0.0–3.2)
Triglycerides: 123 mg/dL (ref 0–149)
VLDL Cholesterol Cal: 21 mg/dL (ref 5–40)

## 2023-12-06 ENCOUNTER — Telehealth: Payer: Self-pay | Admitting: Family Medicine

## 2023-12-06 ENCOUNTER — Other Ambulatory Visit: Payer: Self-pay

## 2023-12-06 DIAGNOSIS — R1011 Right upper quadrant pain: Secondary | ICD-10-CM

## 2023-12-06 DIAGNOSIS — I10 Essential (primary) hypertension: Secondary | ICD-10-CM

## 2023-12-06 MED ORDER — PANTOPRAZOLE SODIUM 40 MG PO TBEC: 40.0000 mg | DELAYED_RELEASE_TABLET | Freq: Every day | ORAL | 1 refills | Status: AC

## 2023-12-06 MED ORDER — AMLODIPINE BESYLATE 5 MG PO TABS: 5.0000 mg | ORAL_TABLET | Freq: Every day | ORAL | 1 refills | Status: AC

## 2023-12-06 MED ORDER — METOPROLOL TARTRATE 100 MG PO TABS: 100.0000 mg | ORAL_TABLET | Freq: Two times a day (BID) | ORAL | 1 refills | Status: AC

## 2023-12-06 NOTE — Telephone Encounter (Signed)
Resent to pharmacy.- [facility].

## 2023-12-06 NOTE — Telephone Encounter (Signed)
 Pt is calling in because she says her medication was sent to the wrong pharmacy. Pt says they were supposed to send it to [facility] pharmacy instead of [facility]. atorvastatin  (LIPITOR) 40 MG tablet [[id number]] metoprolol  tartrate (LOPRESSOR ) 100 MG tablet [[id number]] pantoprazole  (PROTONIX ) 40 MG tablet [[id number]] .

## 2023-12-12 ENCOUNTER — Ambulatory Visit (INDEPENDENT_AMBULATORY_CARE_PROVIDER_SITE_OTHER): Payer: Medicare Other | Admitting: Family Medicine

## 2023-12-12 ENCOUNTER — Encounter: Payer: Self-pay | Admitting: Family Medicine

## 2023-12-12 VITALS — BP 124/80 | HR 76 | Temp 98.0°F | Ht 62.0 in | Wt 128.0 lb

## 2023-12-12 DIAGNOSIS — J440 Chronic obstructive pulmonary disease with acute lower respiratory infection: Secondary | ICD-10-CM | POA: Diagnosis not present

## 2023-12-12 DIAGNOSIS — J029 Acute pharyngitis, unspecified: Secondary | ICD-10-CM

## 2023-12-12 DIAGNOSIS — J209 Acute bronchitis, unspecified: Secondary | ICD-10-CM

## 2023-12-12 DIAGNOSIS — J0100 Acute maxillary sinusitis, unspecified: Secondary | ICD-10-CM | POA: Diagnosis not present

## 2023-12-12 LAB — POCT RAPID STREP A (OFFICE): Rapid Strep A Screen: NEGATIVE

## 2023-12-12 MED ORDER — LEVOFLOXACIN 500 MG PO TABS: 500.0000 mg | ORAL_TABLET | Freq: Every day | ORAL | 0 refills | Status: DC

## 2023-12-12 MED ORDER — PREDNISONE 10 MG PO TABS: ORAL_TABLET | ORAL | 0 refills | Status: DC

## 2023-12-12 NOTE — Progress Notes (Signed)
 Date:  12/12/2023   Name:  Judy Bolton   DOB:  11-25-1946   MRN:  [id number]   Chief Complaint: Sore Throat and Sinus Problem  Sore Throat  This is a new problem. The current episode started in the past 7 days. The problem has been unchanged. Neither side of throat is experiencing more pain than the other. There has been no fever (99). The pain is mild. Associated symptoms include congestion, diarrhea, headaches, a plugged ear sensation and swollen glands. Pertinent negatives include no abdominal pain, coughing, ear pain, hoarse voice, neck pain, shortness of breath or trouble swallowing. She has had no exposure to strep or mono. She has tried gargles and cool liquids (tomato with honey) for the symptoms.  Sinus Problem The current episode started more than 1 year ago. The problem has been gradually improving since onset. There has been no fever. She is experiencing no pain. Associated symptoms include congestion, headaches, sinus pressure, a sore throat and swollen glands. Pertinent negatives include no chills, coughing, ear pain, hoarse voice, neck pain, shortness of breath or sneezing.    Lab Results  Component Value Date   NA 140 05/19/2023   K 4.4 05/19/2023   CO2 21 05/19/2023   GLUCOSE 166 (H) 05/19/2023   BUN 30 (H) 05/19/2023   CREATININE 1.43 (H) 05/19/2023   CALCIUM  8.8 05/19/2023   EGFR 38 (L) 05/19/2023   GFRNONAA 42 (L) 09/21/2022   Lab Results  Component Value Date   CHOL 174 11/18/2023   HDL 93 11/18/2023   LDLCALC 60 11/18/2023   TRIG 123 11/18/2023   CHOLHDL 2.8 01/24/2018   No results found for: TSH Lab Results  Component Value Date   HGBA1C 6.4 08/29/2023   Lab Results  Component Value Date   WBC 9.5 09/21/2022   HGB 14.1 09/21/2022   HCT 43.5 09/21/2022   MCV 86.1 09/21/2022   PLT 260 09/21/2022   Lab Results  Component Value Date   ALT 13 09/20/2022   AST 18 09/20/2022   ALKPHOS 115 09/20/2022   BILITOT 0.7 09/20/2022   No results  found for: 25OHVITD2, 25OHVITD3, VD25OH   Review of Systems  Constitutional:  Negative for chills.  HENT:  Positive for congestion, postnasal drip, sinus pressure and sore throat. Negative for ear pain, hoarse voice, sneezing and trouble swallowing.   Respiratory:  Negative for cough, shortness of breath and wheezing.   Cardiovascular:  Negative for chest pain, palpitations and leg swelling.  Gastrointestinal:  Positive for diarrhea. Negative for abdominal pain.  Musculoskeletal:  Negative for neck pain.  Neurological:  Positive for headaches.    Patient Active Problem List   Diagnosis Date Noted   Chronic rhinitis 10/08/2022   Primary osteoarthritis of right hip 08/21/2021   Essential hypertension 08/30/2017   Gastroesophageal reflux disease 08/30/2017   Aortic atherosclerosis (HCC) 05/31/2017   PAD (peripheral artery disease) (HCC) 05/31/2017   Encounter for long-term (current) use of high-risk medication 12/28/2016   Seronegative rheumatoid arthritis (HCC) 12/28/2016   Bilateral hand pain 11/08/2016   Elevated C-reactive protein 11/08/2016   Swelling of joint of right hand 11/08/2016   Type 2 diabetes mellitus with microalbuminuria, without long-term current use of insulin  (HCC) 07/06/2016   Acute anxiety 04/16/2016   Bronchitis 04/16/2016   Centrilobular emphysema (HCC) 04/16/2016   TIA (transient ischemic attack) 02/29/2016   Lumbar stenosis with neurogenic claudication 04/16/2015   Spondylosis of lumbar region without myelopathy or radiculopathy 04/16/2015   Hyperlipidemia  10/15/2014    Allergies  Allergen Reactions   Rosuvastatin Swelling    Neck swelling   Sulfa Antibiotics Itching   Betadine [Povidone Iodine] Swelling   Cyclobenzaprine      Other reaction(s): Dizziness   Latex Itching and Swelling    (urinary catheter)   Losartan      intolerance   Meloxicam  Swelling   Penicillins Other (See Comments)    Paralysis  Has patient had a PCN reaction causing  immediate rash, facial/tongue/throat swelling, SOB or lightheadedness with hypotension: No Has patient had a PCN reaction causing severe rash involving mucus membranes or skin necrosis: No Has patient had a PCN reaction that required hospitalization Yes Has patient had a PCN reaction occurring within the last 10 years: No If all of the above answers are NO, then may proceed with Cephalosporin use.   Valsartan     Codeine  Itching and Rash    Past Surgical History:  Procedure Laterality Date   ABDOMINAL HYSTERECTOMY     ANTERIOR (CYSTOCELE) AND POSTERIOR REPAIR (RECTOCELE) WITH XENFORM GRAFT AND SACROSPINOUS FIXATION     APPENDECTOMY     BACK SURGERY     BROW LIFT Bilateral 09/05/2018   Procedure: BLEPHAROPLASTY UPPER EYELID W/EXCESS SKIN;  Surgeon: Ashley Greig HERO, MD;  Location: [facility];  Service: Ophthalmology;  Laterality: Bilateral;   CATARACT EXTRACTION W/PHACO Left 01/06/2022   Procedure: CATARACT EXTRACTION PHACO AND INTRAOCULAR LENS PLACEMENT (IOC) LEFT DIABETIC 8.09 01:07.3;  Surgeon: Mittie Gaskin, MD;  Location: [facility];  Service: Ophthalmology;  Laterality: Left;   CATARACT EXTRACTION W/PHACO Right 01/20/2022   Procedure: CATARACT EXTRACTION PHACO AND INTRAOCULAR LENS PLACEMENT (IOC) RIGHT DIABETIC;  Surgeon: Mittie Gaskin, MD;  Location: [facility];  Service: Ophthalmology;  Laterality: Right;  6.03 01:02.2   COLONOSCOPY WITH PROPOFOL  N/A 11/14/2023   Procedure: COLONOSCOPY WITH PROPOFOL ;  Surgeon: Maryruth Ole DASEN, MD;  Location: [facility];  Service: Endoscopy;  Laterality: N/A;   CTR Bilateral    EYE SURGERY     LUMBAR LAMINECTOMY/DECOMPRESSION MICRODISCECTOMY N/A 07/27/2017   Procedure: LUMBAR LAMINECTOMY/DECOMPRESSION MICRODISCECTOMY 3 LEVELS-L3-S1;  Surgeon: Clois Fret, MD;  Location: [facility];  Service: Neurosurgery;  Laterality: N/A;   POLYPECTOMY  11/14/2023   Procedure: POLYPECTOMY;  Surgeon: Maryruth Ole DASEN, MD;  Location: [facility];  Service: Endoscopy;;   PTOSIS REPAIR Bilateral 09/05/2018   Procedure: PTOSIS REPAIR RESECT EX;  Surgeon: Ashley Greig HERO, MD;  Location: [facility];  Service: Ophthalmology;  Laterality: Bilateral;  Diabetic - oral meds sleep apnea Latexd allergy   ROTATOR CUFF REPAIR Right     Social History   Tobacco Use   Smoking status: Former    Current packs/day: 0.00    Average packs/day: 1.5 packs/day for 28.0 years (42.0 ttl pk-yrs)    Types: Cigarettes    Start date: 08/14/1972    Quit date: 08/14/2000    Years since quitting: 23.3   Smokeless tobacco: Never   Tobacco comments:    smoking cessatiuon materials not required  Vaping Use   Vaping status: Never Used  Substance Use Topics   Alcohol use: No   Drug use: No     Medication list has been reviewed and updated.  Current Meds  Medication Sig   albuterol  (PROVENTIL  HFA;VENTOLIN  HFA) 108 (90 Base) MCG/ACT inhaler Inhale 2 puffs into the lungs every 6 (six) hours as needed for wheezing or shortness of breath.   albuterol  (PROVENTIL ) (2.5 MG/3ML) 0.083% nebulizer solution Take 3 mLs (2.5 mg  total) by nebulization every 6 (six) hours as needed for wheezing or shortness of breath.   amLODipine  (NORVASC ) 5 MG tablet Take 1 tablet (5 mg total) by mouth daily.   atorvastatin  (LIPITOR) 40 MG tablet Take 1 tablet (40 mg total) by mouth daily.   cholecalciferol (VITAMIN D3) 25 MCG (1000 UNIT) tablet Take 1,000 Units by mouth daily.   Dulaglutide 1.5 MG/0.5ML SOAJ Inject 1.5 mg into the skin once a week.   FASENRA 30 MG/ML SOSY Once every 2 months for asthma. Paid for by [employer]   gabapentin (NEURONTIN) 300 MG capsule Take 1 capsule by mouth daily.   hydrochlorothiazide  (HYDRODIURIL ) 25 MG tablet TAKE 1 TABLET EVERY DAY   ipratropium (ATROVENT) 0.02 % nebulizer solution Inhale into the lungs.   metFORMIN  (GLUCOPHAGE ) 500 MG tablet Take 500 mg by mouth 2 (two) times daily.   metoprolol   tartrate (LOPRESSOR ) 100 MG tablet Take 1 tablet (100 mg total) by mouth 2 (two) times daily.   montelukast  (SINGULAIR ) 10 MG tablet Take 1 tablet (10 mg total) by mouth daily.   pantoprazole  (PROTONIX ) 40 MG tablet Take 1 tablet (40 mg total) by mouth daily.   vitamin B-12 (CYANOCOBALAMIN ) 1000 MCG tablet Take 1,000 mcg by mouth daily.   zinc gluconate 50 MG tablet Take 50 mg by mouth daily.       11/18/2023   10:56 AM 09/15/2023   10:19 AM 08/19/2023    3:57 PM 08/11/2023    3:50 PM  GAD 7 : Generalized Anxiety Score  Nervous, Anxious, on Edge 0 0 0 0  Control/stop worrying 0 0 0 0  Worry too much - different things 0 0 0 0  Trouble relaxing 0 0 0 0  Restless 0 0 0 0  Easily annoyed or irritable 0 0 0 0  Afraid - awful might happen 0 0 0 0  Total GAD 7 Score 0 0 0 0  Anxiety Difficulty  Not difficult at all Not difficult at all Not difficult at all       11/18/2023   10:56 AM 09/15/2023   10:19 AM 08/19/2023    3:57 PM  Depression screen PHQ 2/9  Decreased Interest 0 0 0  Down, Depressed, Hopeless 0 0 0  PHQ - 2 Score 0 0 0  Altered sleeping 0 3 0  Tired, decreased energy 3 1 0  Change in appetite 1 1 0  Feeling bad or failure about yourself  0  0  Trouble concentrating 1 1 0  Moving slowly or fidgety/restless 0 0 0  Suicidal thoughts 0 0 0  PHQ-9 Score 5 6 0  Difficult doing work/chores Not difficult at all Not difficult at all Not difficult at all    BP Readings from Last 3 Encounters:  12/12/23 124/80  11/18/23 124/79  11/14/23 (!) 174/69    Physical Exam Vitals reviewed.  HENT:     Head: Normocephalic.     Right Ear: Tympanic membrane and ear canal normal. No tenderness. No middle ear effusion.     Left Ear: Tympanic membrane and ear canal normal. No tenderness.  No middle ear effusion.     Nose: No congestion or rhinorrhea.     Right Turbinates: Swollen.     Left Turbinates: Swollen.     Right Sinus: Maxillary sinus tenderness present.     Left Sinus:  Maxillary sinus tenderness present.     Mouth/Throat:     Mouth: Mucous membranes are moist.  Tonsils: No tonsillar exudate or tonsillar abscesses.  Cardiovascular:     Rate and Rhythm: Normal rate and regular rhythm.     Heart sounds: Normal heart sounds. No murmur heard.    No friction rub. No gallop.  Pulmonary:     Breath sounds: Wheezing present. No rhonchi or rales.  Musculoskeletal:     Cervical back: Normal range of motion.  Neurological:     Mental Status: She is alert.     Wt Readings from Last 3 Encounters:  12/12/23 128 lb (58.1 kg)  11/18/23 132 lb (59.9 kg)  09/15/23 127 lb (57.6 kg)    BP 124/80   Pulse 76   Temp 98 F (36.7 C)   Ht 5' 2 (1.575 m)   Wt 128 lb (58.1 kg)   SpO2 94%   BMI 23.41 kg/m   Assessment and Plan: 1. Sore throat (Primary) New onset.  Persistent.  Relatively stable.  Rapid strep is negative so this is probably more of an irritant probably secondary to postnasal drainage of maxillary sinus. - POCT rapid strep A  2. Acute maxillary sinusitis, recurrence not specified Acute.  Persistent.  Stable.  Patient has tenderness over the maxillary sinuses bilateral we will treat with Levaquin  500 mg once a day for 7 days.  3. COPD with acute bronchitis (HCC) Chronic.  Uncontrolled.  Stable.  This needs acute exacerbation of her COPD and has required antibiotics and prednisone  in the past.  We will initiate prednisone  taper beginning at 40 mg over 12 days diffuse wheezes noted throughout lung fields.  We will also initiate Levaquin  500 milligrams once a day per patient's request as that this is done best for her in the past.    Cathryne Molt, MD

## 2023-12-15 DIAGNOSIS — M170 Bilateral primary osteoarthritis of knee: Secondary | ICD-10-CM | POA: Diagnosis not present

## 2023-12-15 DIAGNOSIS — E1129 Type 2 diabetes mellitus with other diabetic kidney complication: Secondary | ICD-10-CM | POA: Diagnosis not present

## 2023-12-15 DIAGNOSIS — R809 Proteinuria, unspecified: Secondary | ICD-10-CM | POA: Diagnosis not present

## 2023-12-16 ENCOUNTER — Encounter: Payer: Self-pay | Admitting: Family Medicine

## 2023-12-19 ENCOUNTER — Telehealth: Payer: Self-pay | Admitting: Family Medicine

## 2023-12-19 NOTE — Telephone Encounter (Signed)
 Copied from CRM [id number]. Topic: General - Other >> Dec 19, 2023  9:55 AM Phill Myron wrote: FYI:  Pt Grape called and stated she no longer uses [facility]  (deleted off preferred pharmacy list)

## 2023-12-19 NOTE — Telephone Encounter (Signed)
 Noted- completed  KP

## 2023-12-23 DIAGNOSIS — M170 Bilateral primary osteoarthritis of knee: Secondary | ICD-10-CM | POA: Diagnosis not present

## 2023-12-30 DIAGNOSIS — M170 Bilateral primary osteoarthritis of knee: Secondary | ICD-10-CM | POA: Diagnosis not present

## 2024-01-06 DIAGNOSIS — M1711 Unilateral primary osteoarthritis, right knee: Secondary | ICD-10-CM | POA: Diagnosis not present

## 2024-01-19 ENCOUNTER — Ambulatory Visit: Payer: Self-pay

## 2024-01-19 NOTE — Telephone Encounter (Signed)
  Chief Complaint: stye to right bottom lid Symptoms: purulent drainage, lower lid swelling and redness, blurred vision that comes and goes worse when pus is dry on lids Frequency: Monday Pertinent Negatives: Patient denies fever, no scleral redness Disposition: [] ED /[] Urgent Care (no appt availability in office) / [x] Appointment(In office/virtual)/ []  [facility]/ [] Home Care/ [] Refused Recommended Disposition /[] [place] [facility]/ []  Follow-up with PCP Additional Notes: pt has been using warm compresses and using OTC ointment named Sty- had fever Monday and Tuesday but none since- pt stated the eye looks better than it initially did Reason for Disposition  [1] Eyelid is very swollen AND [2] no fever  Answer Assessment - Initial Assessment Questions 1. LOCATION: "Which eye has the sty?" "Upper or lower eyelid?"     Right eye 2. SIZE: "How big is it?" (Note: standard pencil eraser is 6 mm)     Smaller than an eraser 3. EYELID: "Is the eyelid swollen?" If Yes, ask: "How much?"     Yes bottom 4. REDNESS: "Has the redness spread onto the eyelid?"     yes 5. ONSET: "When did you notice the sty?"     Monday  6. VISION: "Do you have blurred vision?"      Yes but it comes and goes 7. PAIN: "Is it painful?" If Yes, ask: "How bad is the pain?"  (Scale 1-10; or mild, moderate, severe)     8/10 8. CONTACTS: "Do you wear contacts?"     no 9. OTHER SYMPTOMS: "Do you have any other symptoms?" (e.g., fever)     Drainage/no fever past 2 days - pt stated she can pull the area right corner toward ear and pus drain whitish yellow in color- takes a qtips  Protocols used: Sty-A-AH

## 2024-01-20 ENCOUNTER — Ambulatory Visit (INDEPENDENT_AMBULATORY_CARE_PROVIDER_SITE_OTHER): Payer: Medicare Other | Admitting: Family Medicine

## 2024-01-20 ENCOUNTER — Encounter: Payer: Self-pay | Admitting: Family Medicine

## 2024-01-20 VITALS — BP 112/76 | HR 73 | Temp 97.7°F | Ht 62.0 in | Wt 125.0 lb

## 2024-01-20 DIAGNOSIS — H00012 Hordeolum externum right lower eyelid: Secondary | ICD-10-CM | POA: Diagnosis not present

## 2024-01-20 MED ORDER — OFLOXACIN 0.3 % OP SOLN: 2.0000 [drp] | Freq: Four times a day (QID) | OPHTHALMIC | 0 refills | Status: DC

## 2024-01-20 MED ORDER — DOXYCYCLINE HYCLATE 100 MG PO TABS
100.0000 mg | ORAL_TABLET | Freq: Two times a day (BID) | ORAL | 1 refills | Status: AC
Start: 2024-01-20 — End: ?

## 2024-01-20 MED ORDER — OFLOXACIN 0.3 % OP SOLN: 2.0000 [drp] | Freq: Four times a day (QID) | OPHTHALMIC | 0 refills | Status: AC

## 2024-01-20 NOTE — Progress Notes (Signed)
Date:  01/20/2024   Name:  Judy Bolton   DOB:  Jun 06, 1946   MRN:  [id number]   Chief Complaint: Eye Problem (X1 week, Stye right eye, 101.2 fever on Monday, redness, drainage, tylenol, warm compresses, has went down )  Conjunctivitis  The current episode started more than 1 week ago. The onset was sudden. The problem occurs continuously. The problem has been gradually improving. The problem is mild. Relieved by: release of purulence. Associated symptoms include eye itching, cough, eye discharge and eye redness. Pertinent negatives include no decreased vision, no double vision, no photophobia, no congestion, no ear discharge, no ear pain, no headaches, no hearing loss, no mouth sores, no rhinorrhea, no sore throat, no stridor, no swollen glands, no neck pain and no eye pain.  Sinusitis This is a new problem. The current episode started in the past 7 days. The problem has been gradually worsening since onset. There has been no fever. Associated symptoms include coughing. Pertinent negatives include no chills, congestion, diaphoresis, ear pain, headaches, hoarse voice, neck pain, shortness of breath, sinus pressure, sneezing, sore throat or swollen glands. (Some wheezing) Treatments tried: otc stye ointment.    Lab Results  Component Value Date   NA 140 05/19/2023   K 4.4 05/19/2023   CO2 21 05/19/2023   GLUCOSE 166 (H) 05/19/2023   BUN 30 (H) 05/19/2023   CREATININE 1.43 (H) 05/19/2023   CALCIUM 8.8 05/19/2023   EGFR 38 (L) 05/19/2023   GFRNONAA 42 (L) 09/21/2022   Lab Results  Component Value Date   CHOL 174 11/18/2023   HDL 93 11/18/2023   LDLCALC 60 11/18/2023   TRIG 123 11/18/2023   CHOLHDL 2.8 01/24/2018   No results found for: "TSH" Lab Results  Component Value Date   HGBA1C 6.4 08/29/2023   Lab Results  Component Value Date   WBC 9.5 09/21/2022   HGB 14.1 09/21/2022   HCT 43.5 09/21/2022   MCV 86.1 09/21/2022   PLT 260 09/21/2022   Lab Results  Component  Value Date   ALT 13 09/20/2022   AST 18 09/20/2022   ALKPHOS 115 09/20/2022   BILITOT 0.7 09/20/2022   No results found for: "25OHVITD2", "25OHVITD3", "VD25OH"   Review of Systems  Constitutional:  Negative for chills and diaphoresis.  HENT:  Negative for congestion, ear discharge, ear pain, hearing loss, hoarse voice, mouth sores, rhinorrhea, sinus pressure, sneezing and sore throat.   Eyes:  Positive for discharge, redness and itching. Negative for double vision, photophobia and pain.  Respiratory:  Positive for cough. Negative for shortness of breath and stridor.   Musculoskeletal:  Negative for neck pain.  Neurological:  Negative for headaches.    Patient Active Problem List   Diagnosis Date Noted   Chronic rhinitis 10/08/2022   Primary osteoarthritis of right hip 08/21/2021   Essential hypertension 08/30/2017   Gastroesophageal reflux disease 08/30/2017   Aortic atherosclerosis (HCC) 05/31/2017   PAD (peripheral artery disease) (HCC) 05/31/2017   Encounter for long-term (current) use of high-risk medication 12/28/2016   Seronegative rheumatoid arthritis (HCC) 12/28/2016   Bilateral hand pain 11/08/2016   Elevated C-reactive protein 11/08/2016   Swelling of joint of right hand 11/08/2016   Type 2 diabetes mellitus with microalbuminuria, without long-term current use of insulin (HCC) 07/06/2016   Acute anxiety 04/16/2016   Bronchitis 04/16/2016   Centrilobular emphysema (HCC) 04/16/2016   TIA (transient ischemic attack) 02/29/2016   Lumbar stenosis with neurogenic claudication 04/16/2015   Spondylosis  of lumbar region without myelopathy or radiculopathy 04/16/2015   Hyperlipidemia 10/15/2014    Allergies  Allergen Reactions   Rosuvastatin Swelling    Neck swelling   Sulfa Antibiotics Itching   Betadine [Povidone Iodine] Swelling   Cyclobenzaprine     Other reaction(s): Dizziness   Latex Itching and Swelling    (urinary catheter)   Losartan     intolerance    Meloxicam Swelling   Penicillins Other (See Comments)    Paralysis  Has patient had a PCN reaction causing immediate rash, facial/tongue/throat swelling, SOB or lightheadedness with hypotension: No Has patient had a PCN reaction causing severe rash involving mucus membranes or skin necrosis: No Has patient had a PCN reaction that required hospitalization Yes Has patient had a PCN reaction occurring within the last 10 years: No If all of the above answers are "NO", then may proceed with Cephalosporin use.   Valsartan    Codeine Itching and Rash    Past Surgical History:  Procedure Laterality Date   ABDOMINAL HYSTERECTOMY     ANTERIOR (CYSTOCELE) AND POSTERIOR REPAIR (RECTOCELE) WITH XENFORM GRAFT AND SACROSPINOUS FIXATION     APPENDECTOMY     BACK SURGERY     BROW LIFT Bilateral 09/05/2018   Procedure: BLEPHAROPLASTY UPPER EYELID W/EXCESS SKIN;  Surgeon: Imagene Riches, MD;  Location: [facility];  Service: Ophthalmology;  Laterality: Bilateral;   CATARACT EXTRACTION W/PHACO Left 01/06/2022   Procedure: CATARACT EXTRACTION PHACO AND INTRAOCULAR LENS PLACEMENT (IOC) LEFT DIABETIC 8.09 01:07.3;  Surgeon: Lockie Mola, MD;  Location: [facility];  Service: Ophthalmology;  Laterality: Left;   CATARACT EXTRACTION W/PHACO Right 01/20/2022   Procedure: CATARACT EXTRACTION PHACO AND INTRAOCULAR LENS PLACEMENT (IOC) RIGHT DIABETIC;  Surgeon: Lockie Mola, MD;  Location: [facility];  Service: Ophthalmology;  Laterality: Right;  6.03 01:02.2   COLONOSCOPY WITH PROPOFOL N/A 11/14/2023   Procedure: COLONOSCOPY WITH PROPOFOL;  Surgeon: Regis Bill, MD;  Location: [facility];  Service: Endoscopy;  Laterality: N/A;   CTR Bilateral    EYE SURGERY     LUMBAR LAMINECTOMY/DECOMPRESSION MICRODISCECTOMY N/A 07/27/2017   Procedure: LUMBAR LAMINECTOMY/DECOMPRESSION MICRODISCECTOMY 3 LEVELS-L3-S1;  Surgeon: Venetia Night, MD;  Location: [facility];  Service:  Neurosurgery;  Laterality: N/A;   POLYPECTOMY  11/14/2023   Procedure: POLYPECTOMY;  Surgeon: Regis Bill, MD;  Location: [facility];  Service: Endoscopy;;   PTOSIS REPAIR Bilateral 09/05/2018   Procedure: PTOSIS REPAIR RESECT EX;  Surgeon: Imagene Riches, MD;  Location: [facility];  Service: Ophthalmology;  Laterality: Bilateral;  Diabetic - oral meds sleep apnea Latexd allergy   ROTATOR CUFF REPAIR Right     Social History   Tobacco Use   Smoking status: Former    Current packs/day: 0.00    Average packs/day: 1.5 packs/day for 28.0 years (42.0 ttl pk-yrs)    Types: Cigarettes    Start date: 08/14/1972    Quit date: 08/14/2000    Years since quitting: 23.4   Smokeless tobacco: Never   Tobacco comments:    smoking cessatiuon materials not required  Vaping Use   Vaping status: Never Used  Substance Use Topics   Alcohol use: No   Drug use: No     Medication list has been reviewed and updated.  Current Meds  Medication Sig   albuterol (PROVENTIL HFA;VENTOLIN HFA) 108 (90 Base) MCG/ACT inhaler Inhale 2 puffs into the lungs every 6 (six) hours as needed for wheezing or shortness of breath.   albuterol (  PROVENTIL) (2.5 MG/3ML) 0.083% nebulizer solution Take 3 mLs (2.5 mg total) by nebulization every 6 (six) hours as needed for wheezing or shortness of breath.   amLODipine (NORVASC) 5 MG tablet Take 1 tablet (5 mg total) by mouth daily.   atorvastatin (LIPITOR) 40 MG tablet Take 1 tablet (40 mg total) by mouth daily.   cholecalciferol (VITAMIN D3) 25 MCG (1000 UNIT) tablet Take 1,000 Units by mouth daily.   Dulaglutide 1.5 MG/0.5ML SOAJ Inject 1.5 mg into the skin once a week.   FASENRA 30 MG/ML SOSY Once every 2 months for asthma. Paid for by [employer]   gabapentin (NEURONTIN) 300 MG capsule Take 1 capsule by mouth daily.   hydrochlorothiazide (HYDRODIURIL) 25 MG tablet TAKE 1 TABLET EVERY DAY   ipratropium (ATROVENT) 0.02 % nebulizer solution Inhale into the  lungs.   metFORMIN (GLUCOPHAGE) 500 MG tablet Take 500 mg by mouth 2 (two) times daily.   metoprolol tartrate (LOPRESSOR) 100 MG tablet Take 1 tablet (100 mg total) by mouth 2 (two) times daily.   montelukast (SINGULAIR) 10 MG tablet Take 1 tablet (10 mg total) by mouth daily.   pantoprazole (PROTONIX) 40 MG tablet Take 1 tablet (40 mg total) by mouth daily.   predniSONE (DELTASONE) 5 MG tablet Take 1 tablet by mouth daily.   tiZANidine (ZANAFLEX) 2 MG tablet Take 2 mg by mouth at bedtime as needed.   vitamin B-12 (CYANOCOBALAMIN) 1000 MCG tablet Take 1,000 mcg by mouth daily.   zinc gluconate 50 MG tablet Take 50 mg by mouth daily.       01/20/2024   10:14 AM 11/18/2023   10:56 AM 09/15/2023   10:19 AM 08/19/2023    3:57 PM  GAD 7 : Generalized Anxiety Score  Nervous, Anxious, on Edge 0 0 0 0  Control/stop worrying 0 0 0 0  Worry too much - different things 0 0 0 0  Trouble relaxing 0 0 0 0  Restless 0 0 0 0  Easily annoyed or irritable 0 0 0 0  Afraid - awful might happen 0 0 0 0  Total GAD 7 Score 0 0 0 0  Anxiety Difficulty Not difficult at all  Not difficult at all Not difficult at all       01/20/2024   10:14 AM 11/18/2023   10:56 AM 09/15/2023   10:19 AM  Depression screen PHQ 2/9  Decreased Interest 0 0 0  Down, Depressed, Hopeless 0 0 0  PHQ - 2 Score 0 0 0  Altered sleeping  0 3  Tired, decreased energy  3 1  Change in appetite  1 1  Feeling bad or failure about yourself   0   Trouble concentrating  1 1  Moving slowly or fidgety/restless  0 0  Suicidal thoughts  0 0  PHQ-9 Score  5 6  Difficult doing work/chores  Not difficult at all Not difficult at all    BP Readings from Last 3 Encounters:  01/20/24 112/76  12/12/23 124/80  11/18/23 124/79    Physical Exam Constitutional:      General: She is not in acute distress.    Appearance: She is not diaphoretic.  HENT:     Head: Normocephalic and atraumatic.     Right Ear: External ear normal.     Left  Ear: External ear normal.     Nose: Nose normal.  Eyes:     General:        Right eye: Discharge and  hordeolum present.        Left eye: No discharge.     Conjunctiva/sclera:     Right eye: Right conjunctiva is injected.     Pupils: Pupils are equal, round, and reactive to light.  Neck:     Thyroid: No thyromegaly.     Vascular: No JVD.  Cardiovascular:     Rate and Rhythm: Normal rate and regular rhythm.     Heart sounds: Normal heart sounds. No murmur heard.    No friction rub. No gallop.  Pulmonary:     Effort: Pulmonary effort is normal.     Breath sounds: Normal breath sounds. No wheezing, rhonchi or rales.  Abdominal:     General: Bowel sounds are normal.     Palpations: Abdomen is soft. There is no mass.     Tenderness: There is no abdominal tenderness. There is no guarding.  Musculoskeletal:        General: Normal range of motion.     Cervical back: Normal range of motion and neck supple.  Lymphadenopathy:     Cervical: No cervical adenopathy.  Skin:    General: Skin is warm and dry.  Neurological:     Mental Status: She is alert.     Deep Tendon Reflexes: Reflexes are normal and symmetric.     Wt Readings from Last 3 Encounters:  01/20/24 125 lb (56.7 kg)  12/12/23 128 lb (58.1 kg)  11/18/23 132 lb (59.9 kg)    BP 112/76   Pulse 73   Temp 97.7 F (36.5 C)   Ht 5\' 2"  (1.575 m)   Wt 125 lb (56.7 kg)   SpO2 95%   BMI 22.86 kg/m   Assessment and Plan: 1. Hordeolum externum of right lower eyelid (Primary) New onset.  Persistent.  Partially drained of purulence earlier this morning when patient awoke.  Patient says that there is a level of improvement since.  We will treat with warm compresses and initiate ciprofloxacin ophthalmic drops 2 drops in eye every 6 hours.  Patient is to contact her ophthalmologist if not significantly improved over the weekend.    Elizabeth Sauer, MD

## 2024-01-20 NOTE — Patient Instructions (Signed)
Chalazion  A chalazion is a swelling or lump on the eyelid. It can affect the upper eyelid or the lower eyelid. What are the causes? This condition may be caused by: Long-lasting (chronic) inflammation of the eyelid glands. A blocked oil gland in the eyelid. What are the signs or symptoms? Symptoms of this condition include: Swelling of the eyelid that: May spread to areas around the eye. May be painful. A hard lump on the eyelid. Blurry vision. The lump may make it hard to see out of the eye. How is this diagnosed? This condition is diagnosed with an examination of the eye. How is this treated? This condition is treated by applying a warm, moist cloth (warm compress) to the eyelid. If the condition does not improve, it may be treated with: Medicine that is applied to the eye. Oral medicines. Medicine that is injected into the chalazion. Surgery. Follow these instructions at home: Managing pain and swelling Apply a warm compress to the eyelid for 10-15 minutes, 4 to 6 times a day. This will help to open any blocked glands and to reduce redness and swelling. Take and apply over-the-counter and prescription medicines only as told by your health care provider. General instructions Do not touch the chalazion. Do not try to remove the pus. Do not squeeze the chalazion or stick it with a pin or needle. Do not rub your eyes. Wash your hands often with soap and water for at least 20 seconds. Dry your hands with a clean towel. Keep your face, scalp, and eyebrows clean. Avoid wearing eye makeup. Keep all follow-up visits. This is important. Contact a health care provider if: Your eyelid is getting worse. You have a fever. The chalazion does not break open (rupture) or go away on its own and your eyelid has not improved for 4 weeks. Get help right away if: You have pain in your eye. Your vision worsens. The chalazion becomes painful or red. The chalazion gets bigger. Summary A  chalazion is a swelling or lump on the upper or lower eyelid. It may be caused by chronic inflammation or a blocked oil gland. Apply a warm compress to the eyelid for 10-15 minutes, 4 to 6 times a day. Keep your face, scalp, and eyebrows clean. This information is not intended to replace advice given to you by your health care provider. Make sure you discuss any questions you have with your health care provider. Document Revised: 01/28/2021 Document Reviewed: 01/28/2021 Elsevier Patient Education  2024 [employer].

## 2024-02-13 DIAGNOSIS — K219 Gastro-esophageal reflux disease without esophagitis: Secondary | ICD-10-CM | POA: Diagnosis not present

## 2024-02-13 DIAGNOSIS — J4489 Other specified chronic obstructive pulmonary disease: Secondary | ICD-10-CM | POA: Diagnosis not present

## 2024-02-13 DIAGNOSIS — R059 Cough, unspecified: Secondary | ICD-10-CM | POA: Diagnosis not present

## 2024-02-13 DIAGNOSIS — J301 Allergic rhinitis due to pollen: Secondary | ICD-10-CM | POA: Diagnosis not present

## 2024-02-13 DIAGNOSIS — R062 Wheezing: Secondary | ICD-10-CM | POA: Diagnosis not present

## 2024-02-18 ENCOUNTER — Emergency Department

## 2024-02-18 ENCOUNTER — Emergency Department
Admission: EM | Admit: 2024-02-18 | Discharge: 2024-02-19 | Disposition: A | Discharge status: Home / Self Care | Attending: Emergency Medicine | Admitting: Emergency Medicine

## 2024-02-18 ENCOUNTER — Other Ambulatory Visit: Payer: Self-pay

## 2024-02-18 DIAGNOSIS — J441 Chronic obstructive pulmonary disease with (acute) exacerbation: Secondary | ICD-10-CM | POA: Insufficient documentation

## 2024-02-18 DIAGNOSIS — I10 Essential (primary) hypertension: Secondary | ICD-10-CM | POA: Insufficient documentation

## 2024-02-18 DIAGNOSIS — E119 Type 2 diabetes mellitus without complications: Secondary | ICD-10-CM | POA: Insufficient documentation

## 2024-02-18 DIAGNOSIS — Z8673 Personal history of transient ischemic attack (TIA), and cerebral infarction without residual deficits: Secondary | ICD-10-CM | POA: Insufficient documentation

## 2024-02-18 DIAGNOSIS — R059 Cough, unspecified: Secondary | ICD-10-CM | POA: Diagnosis not present

## 2024-02-18 DIAGNOSIS — R0602 Shortness of breath: Secondary | ICD-10-CM | POA: Diagnosis not present

## 2024-02-18 LAB — CBC WITH DIFFERENTIAL/PLATELET
Abs Immature Granulocytes: 0.33 10*3/uL — ABNORMAL HIGH (ref 0.00–0.07)
Basophils Absolute: 0.1 10*3/uL (ref 0.0–0.1)
Basophils Relative: 0 %
Eosinophils Absolute: 0 10*3/uL (ref 0.0–0.5)
Eosinophils Relative: 0 %
HCT: 39.7 % (ref 36.0–46.0)
Hemoglobin: 13.3 g/dL (ref 12.0–15.0)
Immature Granulocytes: 3 %
Lymphocytes Relative: 8 %
Lymphs Abs: 1.1 10*3/uL (ref 0.7–4.0)
MCH: 29.2 pg (ref 26.0–34.0)
MCHC: 33.5 g/dL (ref 30.0–36.0)
MCV: 87.3 fL (ref 80.0–100.0)
Monocytes Absolute: 0.7 10*3/uL (ref 0.1–1.0)
Monocytes Relative: 6 %
Neutro Abs: 11.2 10*3/uL — ABNORMAL HIGH (ref 1.7–7.7)
Neutrophils Relative %: 83 %
Platelets: 204 10*3/uL (ref 150–400)
RBC: 4.55 MIL/uL (ref 3.87–5.11)
RDW: 13.8 % (ref 11.5–15.5)
WBC: 13.4 10*3/uL — ABNORMAL HIGH (ref 4.0–10.5)
nRBC: 0 % (ref 0.0–0.2)

## 2024-02-18 LAB — BASIC METABOLIC PANEL
Anion gap: 13 (ref 5–15)
BUN: 43 mg/dL — ABNORMAL HIGH (ref 8–23)
CO2: 26 mmol/L (ref 22–32)
Calcium: 9.1 mg/dL (ref 8.9–10.3)
Chloride: 97 mmol/L — ABNORMAL LOW (ref 98–111)
Creatinine, Ser: 1.36 mg/dL — ABNORMAL HIGH (ref 0.44–1.00)
GFR, Estimated: 41 mL/min — ABNORMAL LOW (ref >60)
Glucose, Bld: 339 mg/dL — ABNORMAL HIGH (ref 70–99)
Potassium: 4.2 mmol/L (ref 3.5–5.1)
Sodium: 136 mmol/L (ref 135–145)

## 2024-02-18 LAB — RESP PANEL BY RT-PCR (RSV, FLU A&B, COVID)  RVPGX2
Influenza A by PCR: NEGATIVE
Influenza B by PCR: NEGATIVE
Resp Syncytial Virus by PCR: NEGATIVE
SARS Coronavirus 2 by RT PCR: NEGATIVE

## 2024-02-18 LAB — D-DIMER, QUANTITATIVE: D-Dimer, Quant: <0.27 ug{FEU}/mL (ref 0.00–0.50)

## 2024-02-18 LAB — TROPONIN I (HIGH SENSITIVITY): Troponin I (High Sensitivity): 9 ng/L (ref <18)

## 2024-02-18 MED ORDER — BENZONATATE 100 MG PO CAPS
200.0000 mg | ORAL_CAPSULE | Freq: Once | ORAL | Status: AC
  Administered 2024-02-18: 200 mg via ORAL
  Filled 2024-02-18: qty 2

## 2024-02-18 MED ORDER — BENZONATATE 100 MG PO CAPS: 100.0000 mg | ORAL_CAPSULE | Freq: Three times a day (TID) | ORAL | 0 refills | Status: AC | PRN

## 2024-02-18 MED ORDER — IPRATROPIUM-ALBUTEROL 0.5-2.5 (3) MG/3ML IN SOLN
3.0000 mL | Freq: Once | RESPIRATORY_TRACT | Status: AC
  Administered 2024-02-18: 3 mL via RESPIRATORY_TRACT
  Filled 2024-02-18: qty 6

## 2024-02-18 MED ORDER — GUAIFENESIN ER 600 MG PO TB12
600.0000 mg | ORAL_TABLET | Freq: Two times a day (BID) | ORAL | 0 refills | Status: AC
Start: 2024-02-18 — End: 2024-03-04

## 2024-02-18 MED ORDER — MAGNESIUM SULFATE 2 GM/50ML IV SOLN
2.0000 g | INTRAVENOUS | Status: AC
  Administered 2024-02-18: 2 g via INTRAVENOUS
  Filled 2024-02-18: qty 50

## 2024-02-18 MED ORDER — MORPHINE SULFATE (PF) 2 MG/ML IV SOLN
2.0000 mg | Freq: Once | INTRAVENOUS | Status: DC
  Filled 2024-02-18: qty 1

## 2024-02-18 NOTE — ED Provider Notes (Signed)
 [facility] Provider Note    Event Date/Time   First MD Initiated Contact with Patient 02/18/24 1957     (approximate)   History   Chief Complaint: Cough and Shortness of Breath   HPI  Judy Bolton is a 78 y.o. female with a history of hypertension hyperlipidemia stroke seizure COPD diabetes who comes ED complaining of cough for the past 2 weeks.  Has taken a course of Levaquin and azithromycin, and is currently finishing a prednisone taper course from her PCP.  Reports that frequent coughing is uncomfortable.  No fever.          Physical Exam   Triage Vital Signs: ED Triage Vitals  Encounter Vitals Group     BP 02/18/24 1949 (!) 189/80     Systolic BP Percentile --      Diastolic BP Percentile --      Pulse Rate 02/18/24 1949 79     Resp 02/18/24 1949 19     Temp 02/18/24 1949 98.2 F (36.8 C)     Temp Source 02/18/24 1949 Oral     SpO2 02/18/24 1949 97 %     Weight 02/18/24 1950 127 lb (57.6 kg)     Height 02/18/24 1950 5' 1.5" (1.562 m)     Head Circumference --      Peak Flow --      Pain Score 02/18/24 1949 0     Pain Loc --      Pain Education --      Exclude from Growth Chart --     Most recent vital signs: Vitals:   02/18/24 2123 02/18/24 2130  BP:  (!) 165/79  Pulse: 76 76  Resp: 14 15  Temp:    SpO2: 99% 99%    General: Awake, no distress.  CV:  Good peripheral perfusion.  Regular rate rhythm, normal distal pulses Resp:  Normal effort.  Coarse breath sounds with normal expiratory phase.  No focal crackles Abd:  No distention.  Soft nontender Other:  No lower extremity edema or calf tenderness   ED Results / Procedures / Treatments   Labs (all labs ordered are listed, but only abnormal results are displayed) Labs Reviewed  BASIC METABOLIC PANEL - Abnormal; Notable for the following components:      Result Value   Chloride 97 (*)    Glucose, Bld 339 (*)    BUN 43 (*)    Creatinine, Ser 1.36 (*)    GFR,  Estimated 41 (*)    All other components within normal limits  CBC WITH DIFFERENTIAL/PLATELET - Abnormal; Notable for the following components:   WBC 13.4 (*)    Neutro Abs 11.2 (*)    Abs Immature Granulocytes 0.33 (*)    All other components within normal limits  RESP PANEL BY RT-PCR (RSV, FLU A&B, COVID)  [id number]  D-DIMER, QUANTITATIVE  TROPONIN I (HIGH SENSITIVITY)     EKG Interpreted by me Sinus rhythm rate of 75.  Normal axis, normal intervals.  Poor R wave progression.  Normal ST segments and T waves.   RADIOLOGY Chest x-ray interpreted by me, unremarkable.  Radiology report reviewed   PROCEDURES:  Procedures   MEDICATIONS ORDERED IN ED: Medications  magnesium sulfate IVPB 2 g 50 mL (0 g Intravenous Stopped 02/18/24 2123)  ipratropium-albuterol (DUONEB) 0.5-2.5 (3) MG/3ML nebulizer solution 3 mL (3 mLs Nebulization Given 02/18/24 2043)  benzonatate (TESSALON) capsule 200 mg (200 mg Oral Given 02/18/24 2043)  IMPRESSION / MDM / ASSESSMENT AND PLAN / ED COURSE  I reviewed the triage vital signs and the nursing notes.  DDx: Bronchitis, pneumonia, pleural effusion, pulmonary embolism, non-STEMI  Patient's presentation is most consistent with acute presentation with potential threat to life or bodily function.  Patient presents with persistent shortness of breath and cough for the past 2 weeks despite steroids and 2 courses of antibiotics.  Currently vital signs are unremarkable except for uncontrolled hypertension.  Oxygenation is normal, no respiratory distress, normal work of breathing, afebrile.  Suspect that this is persistent bronchitis related to airway inflammation which can be managed supportively.  Due to her age and comorbidities, will obtain labs, chest x-ray, EKG while giving medications.   ----------------------------------------- 11:09 PM on 02/18/2024 ----------------------------------------- Breathing comfort improved.  Feels better after medications  in the ED.  Workup reassuring, oxygenation 99% on room air.  Ambulatory.  Comfortable with discharge and outpatient follow-up.      FINAL CLINICAL IMPRESSION(S) / ED DIAGNOSES   Final diagnoses:  COPD exacerbation (HCC)     Rx / DC Orders   ED Discharge Orders          Ordered    guaiFENesin (MUCINEX) 600 MG 12 hr tablet  2 times daily        02/18/24 2309    benzonatate (TESSALON PERLES) 100 MG capsule  3 times daily PRN        02/18/24 2309             Note:  This document was prepared using Dragon voice recognition software and may include unintentional dictation errors.   Sharman Cheek, MD 02/18/24 [phone]

## 2024-02-18 NOTE — ED Triage Notes (Signed)
 Pt arrived POV for a congested cough x2 weeks, feels SOB during "coughing spells" and harder to breath when laying down to go to sleep. Pt reports has used her NEB treatments and inhaler multiple times today. Seen PCP for same and they prescribe ABX and taper pack of prednisone, pt reports she more scared when she goes to sleep as she feels it will be my last breath". Pt is speaking in complete sentences, sats 97% on RA, has a very congested cough, hx of COPD.

## 2024-02-19 NOTE — ED Notes (Signed)
 While removing patient's iv patient acquired an approximate 3cm skin tear,  wound visualized by MD wound covered and dressed

## 2024-02-27 DIAGNOSIS — D179 Benign lipomatous neoplasm, unspecified: Secondary | ICD-10-CM | POA: Diagnosis not present

## 2024-02-27 DIAGNOSIS — R93421 Abnormal radiologic findings on diagnostic imaging of right kidney: Secondary | ICD-10-CM | POA: Diagnosis not present

## 2024-02-27 DIAGNOSIS — E1122 Type 2 diabetes mellitus with diabetic chronic kidney disease: Secondary | ICD-10-CM | POA: Diagnosis not present

## 2024-02-27 DIAGNOSIS — E785 Hyperlipidemia, unspecified: Secondary | ICD-10-CM | POA: Diagnosis not present

## 2024-02-27 DIAGNOSIS — R809 Proteinuria, unspecified: Secondary | ICD-10-CM | POA: Diagnosis not present

## 2024-02-27 DIAGNOSIS — I152 Hypertension secondary to endocrine disorders: Secondary | ICD-10-CM | POA: Diagnosis not present

## 2024-02-27 DIAGNOSIS — N1832 Chronic kidney disease, stage 3b: Secondary | ICD-10-CM | POA: Diagnosis not present

## 2024-02-27 DIAGNOSIS — E1129 Type 2 diabetes mellitus with other diabetic kidney complication: Secondary | ICD-10-CM | POA: Diagnosis not present

## 2024-02-27 DIAGNOSIS — E1159 Type 2 diabetes mellitus with other circulatory complications: Secondary | ICD-10-CM | POA: Diagnosis not present

## 2024-02-27 DIAGNOSIS — E1169 Type 2 diabetes mellitus with other specified complication: Secondary | ICD-10-CM | POA: Diagnosis not present

## 2024-02-27 DIAGNOSIS — I10 Essential (primary) hypertension: Secondary | ICD-10-CM | POA: Diagnosis not present

## 2024-03-20 ENCOUNTER — Other Ambulatory Visit: Payer: Self-pay | Admitting: Family Medicine

## 2024-03-20 DIAGNOSIS — J4521 Mild intermittent asthma with (acute) exacerbation: Secondary | ICD-10-CM

## 2024-04-12 DIAGNOSIS — R809 Proteinuria, unspecified: Secondary | ICD-10-CM | POA: Diagnosis not present

## 2024-04-12 DIAGNOSIS — E1129 Type 2 diabetes mellitus with other diabetic kidney complication: Secondary | ICD-10-CM | POA: Diagnosis not present

## 2024-04-12 DIAGNOSIS — M170 Bilateral primary osteoarthritis of knee: Secondary | ICD-10-CM | POA: Diagnosis not present

## 2024-04-19 ENCOUNTER — Other Ambulatory Visit: Payer: Self-pay | Admitting: Family Medicine

## 2024-04-19 DIAGNOSIS — J4521 Mild intermittent asthma with (acute) exacerbation: Secondary | ICD-10-CM

## 2024-05-02 DIAGNOSIS — R222 Localized swelling, mass and lump, trunk: Secondary | ICD-10-CM | POA: Diagnosis not present

## 2024-05-02 DIAGNOSIS — I10 Essential (primary) hypertension: Secondary | ICD-10-CM | POA: Diagnosis not present

## 2024-05-02 DIAGNOSIS — E1129 Type 2 diabetes mellitus with other diabetic kidney complication: Secondary | ICD-10-CM | POA: Diagnosis not present

## 2024-05-02 DIAGNOSIS — I739 Peripheral vascular disease, unspecified: Secondary | ICD-10-CM | POA: Diagnosis not present

## 2024-05-03 ENCOUNTER — Ambulatory Visit: Payer: Self-pay | Admitting: Family Medicine

## 2024-05-14 DIAGNOSIS — N1832 Chronic kidney disease, stage 3b: Secondary | ICD-10-CM | POA: Diagnosis not present

## 2024-05-14 DIAGNOSIS — L03116 Cellulitis of left lower limb: Secondary | ICD-10-CM | POA: Diagnosis not present

## 2024-05-14 DIAGNOSIS — E1122 Type 2 diabetes mellitus with diabetic chronic kidney disease: Secondary | ICD-10-CM | POA: Diagnosis not present

## 2024-05-14 DIAGNOSIS — I739 Peripheral vascular disease, unspecified: Secondary | ICD-10-CM | POA: Diagnosis not present
